# Patient Record
Sex: Male | Born: 1940 | Race: White | Hispanic: No | Smoking: Never smoker
Health system: Southern US, Community
[De-identification: ages and names within clinical notes are randomized; demographics above are authoritative.]

## PROBLEM LIST (undated history)

## (undated) DIAGNOSIS — T83111A Breakdown (mechanical) of urinary sphincter implant, initial encounter: Secondary | ICD-10-CM

## (undated) DIAGNOSIS — Z8719 Personal history of other diseases of the digestive system: Secondary | ICD-10-CM

## (undated) DIAGNOSIS — R0683 Snoring: Secondary | ICD-10-CM

## (undated) DIAGNOSIS — N529 Male erectile dysfunction, unspecified: Secondary | ICD-10-CM

## (undated) DIAGNOSIS — I712 Thoracic aortic aneurysm, without rupture: Secondary | ICD-10-CM

## (undated) DIAGNOSIS — Z9289 Personal history of other medical treatment: Secondary | ICD-10-CM

## (undated) DIAGNOSIS — F40240 Claustrophobia: Secondary | ICD-10-CM

## (undated) DIAGNOSIS — F411 Generalized anxiety disorder: Secondary | ICD-10-CM

## (undated) DIAGNOSIS — I5022 Chronic systolic (congestive) heart failure: Secondary | ICD-10-CM

## (undated) DIAGNOSIS — K219 Gastro-esophageal reflux disease without esophagitis: Secondary | ICD-10-CM

## (undated) DIAGNOSIS — N393 Stress incontinence (female) (male): Secondary | ICD-10-CM

## (undated) DIAGNOSIS — H409 Unspecified glaucoma: Secondary | ICD-10-CM

## (undated) DIAGNOSIS — Z8546 Personal history of malignant neoplasm of prostate: Secondary | ICD-10-CM

## (undated) DIAGNOSIS — M1990 Unspecified osteoarthritis, unspecified site: Secondary | ICD-10-CM

## (undated) DIAGNOSIS — I4891 Unspecified atrial fibrillation: Secondary | ICD-10-CM

## (undated) DIAGNOSIS — R351 Nocturia: Secondary | ICD-10-CM

## (undated) DIAGNOSIS — I509 Heart failure, unspecified: Secondary | ICD-10-CM

## (undated) DIAGNOSIS — K5790 Diverticulosis of intestine, part unspecified, without perforation or abscess without bleeding: Secondary | ICD-10-CM

## (undated) DIAGNOSIS — R918 Other nonspecific abnormal finding of lung field: Secondary | ICD-10-CM

## (undated) DIAGNOSIS — E041 Nontoxic single thyroid nodule: Secondary | ICD-10-CM

## (undated) DIAGNOSIS — T83490A Other mechanical complication of penile (implanted) prosthesis, initial encounter: Secondary | ICD-10-CM

## (undated) DIAGNOSIS — I428 Other cardiomyopathies: Secondary | ICD-10-CM

## (undated) HISTORY — DX: Chronic systolic (congestive) heart failure: I50.22

## (undated) HISTORY — DX: Nontoxic single thyroid nodule: E04.1

## (undated) HISTORY — DX: Thoracic aortic aneurysm, without rupture: I71.2

## (undated) HISTORY — DX: Personal history of other medical treatment: Z92.89

## (undated) HISTORY — DX: Personal history of other diseases of the digestive system: Z87.19

## (undated) HISTORY — DX: Heart failure, unspecified: I50.9

## (undated) HISTORY — DX: Generalized anxiety disorder: F41.1

## (undated) HISTORY — DX: Other cardiomyopathies: I42.8

## (undated) HISTORY — DX: Diverticulosis of intestine, part unspecified, without perforation or abscess without bleeding: K57.90

## (undated) HISTORY — DX: Personal history of malignant neoplasm of prostate: Z85.46

## (undated) HISTORY — PX: ANKLE SURGERY: SHX546

## (undated) HISTORY — DX: Claustrophobia: F40.240

## (undated) HISTORY — DX: Snoring: R06.83

## (undated) HISTORY — DX: Unspecified atrial fibrillation: I48.91

## (undated) HISTORY — PX: BIOPSY THYROID: PRO38

## (undated) HISTORY — DX: Other nonspecific abnormal finding of lung field: R91.8

## (undated) HISTORY — DX: Gastro-esophageal reflux disease without esophagitis: K21.9

---

## 1998-10-14 ENCOUNTER — Other Ambulatory Visit: Admission: RE | Admit: 1998-10-14 | Discharge: 1998-10-14 | Payer: Self-pay | Admitting: Urology

## 1998-11-21 ENCOUNTER — Inpatient Hospital Stay (HOSPITAL_COMMUNITY): Admission: RE | Admit: 1998-11-21 | Discharge: 1998-11-23 | Payer: Self-pay | Admitting: Urology

## 1999-06-22 ENCOUNTER — Inpatient Hospital Stay (HOSPITAL_COMMUNITY): Admission: RE | Admit: 1999-06-22 | Discharge: 1999-06-23 | Payer: Self-pay | Admitting: Urology

## 1999-06-22 HISTORY — PX: OTHER SURGICAL HISTORY: SHX169

## 1999-06-26 ENCOUNTER — Encounter: Admission: RE | Admit: 1999-06-26 | Discharge: 1999-09-24 | Payer: Self-pay | Admitting: Family Medicine

## 2001-01-20 ENCOUNTER — Ambulatory Visit (HOSPITAL_COMMUNITY): Admission: RE | Admit: 2001-01-20 | Discharge: 2001-01-20 | Payer: Self-pay | Admitting: Urology

## 2001-01-20 HISTORY — PX: OTHER SURGICAL HISTORY: SHX169

## 2004-05-15 ENCOUNTER — Ambulatory Visit (HOSPITAL_BASED_OUTPATIENT_CLINIC_OR_DEPARTMENT_OTHER): Admission: RE | Admit: 2004-05-15 | Discharge: 2004-05-15 | Payer: Self-pay | Admitting: Urology

## 2004-05-15 ENCOUNTER — Ambulatory Visit (HOSPITAL_COMMUNITY): Admission: RE | Admit: 2004-05-15 | Discharge: 2004-05-15 | Payer: Self-pay | Admitting: Urology

## 2004-05-15 HISTORY — PX: OTHER SURGICAL HISTORY: SHX169

## 2005-01-19 HISTORY — PX: OTHER SURGICAL HISTORY: SHX169

## 2005-01-22 ENCOUNTER — Observation Stay (HOSPITAL_COMMUNITY): Admission: EM | Admit: 2005-01-22 | Discharge: 2005-01-22 | Payer: Self-pay | Admitting: *Deleted

## 2008-04-26 HISTORY — PX: COLONOSCOPY: SHX174

## 2011-09-07 ENCOUNTER — Other Ambulatory Visit: Payer: Self-pay | Admitting: Urology

## 2011-09-16 ENCOUNTER — Encounter (HOSPITAL_BASED_OUTPATIENT_CLINIC_OR_DEPARTMENT_OTHER): Payer: Self-pay | Admitting: *Deleted

## 2011-09-17 ENCOUNTER — Encounter (HOSPITAL_BASED_OUTPATIENT_CLINIC_OR_DEPARTMENT_OTHER): Payer: Self-pay | Admitting: *Deleted

## 2011-09-17 NOTE — Progress Notes (Signed)
NPO AFTER MN. ARRIVES AT 0715. NEEDS ISTAT AND EKG. WILL DO HIBICLENS HS AND AM OF SURG.

## 2011-09-24 ENCOUNTER — Encounter (HOSPITAL_BASED_OUTPATIENT_CLINIC_OR_DEPARTMENT_OTHER): Payer: Self-pay | Admitting: Anesthesiology

## 2011-09-24 ENCOUNTER — Ambulatory Visit (HOSPITAL_BASED_OUTPATIENT_CLINIC_OR_DEPARTMENT_OTHER)
Admission: RE | Admit: 2011-09-24 | Discharge: 2011-09-24 | Disposition: A | Discharge status: Home / Self Care | Payer: Medicare Other | Source: Ambulatory Visit | Attending: Urology | Admitting: Urology

## 2011-09-24 ENCOUNTER — Ambulatory Visit (HOSPITAL_BASED_OUTPATIENT_CLINIC_OR_DEPARTMENT_OTHER): Payer: Medicare Other | Admitting: Anesthesiology

## 2011-09-24 ENCOUNTER — Encounter (HOSPITAL_BASED_OUTPATIENT_CLINIC_OR_DEPARTMENT_OTHER)
Admission: RE | Disposition: A | Discharge status: Home / Self Care | Payer: Self-pay | Source: Ambulatory Visit | Attending: Urology

## 2011-09-24 ENCOUNTER — Encounter (HOSPITAL_BASED_OUTPATIENT_CLINIC_OR_DEPARTMENT_OTHER): Payer: Self-pay | Admitting: *Deleted

## 2011-09-24 DIAGNOSIS — T83490A Other mechanical complication of penile (implanted) prosthesis, initial encounter: Secondary | ICD-10-CM

## 2011-09-24 DIAGNOSIS — T8389XA Other specified complication of genitourinary prosthetic devices, implants and grafts, initial encounter: Secondary | ICD-10-CM | POA: Insufficient documentation

## 2011-09-24 DIAGNOSIS — Z8546 Personal history of malignant neoplasm of prostate: Secondary | ICD-10-CM | POA: Insufficient documentation

## 2011-09-24 DIAGNOSIS — N529 Male erectile dysfunction, unspecified: Secondary | ICD-10-CM | POA: Insufficient documentation

## 2011-09-24 DIAGNOSIS — Z79899 Other long term (current) drug therapy: Secondary | ICD-10-CM | POA: Insufficient documentation

## 2011-09-24 DIAGNOSIS — Y831 Surgical operation with implant of artificial internal device as the cause of abnormal reaction of the patient, or of later complication, without mention of misadventure at the time of the procedure: Secondary | ICD-10-CM | POA: Insufficient documentation

## 2011-09-24 DIAGNOSIS — E119 Type 2 diabetes mellitus without complications: Secondary | ICD-10-CM | POA: Insufficient documentation

## 2011-09-24 HISTORY — DX: Other mechanical complication of implanted penile prosthesis, initial encounter: T83.490A

## 2011-09-24 HISTORY — DX: Unspecified osteoarthritis, unspecified site: M19.90

## 2011-09-24 HISTORY — DX: Stress incontinence (female) (male): N39.3

## 2011-09-24 HISTORY — DX: Unspecified glaucoma: H40.9

## 2011-09-24 HISTORY — PX: PENILE PROSTHESIS IMPLANT: SHX240

## 2011-09-24 HISTORY — DX: Male erectile dysfunction, unspecified: N52.9

## 2011-09-24 HISTORY — DX: Nocturia: R35.1

## 2011-09-24 HISTORY — DX: Breakdown (mechanical) of implanted urinary sphincter, initial encounter: T83.111A

## 2011-09-24 LAB — GLUCOSE, CAPILLARY: Glucose-Capillary: 203 mg/dL — ABNORMAL HIGH (ref 70–99)

## 2011-09-24 LAB — POCT I-STAT 4, (NA,K, GLUC, HGB,HCT)
Glucose, Bld: 146 mg/dL — ABNORMAL HIGH (ref 70–99)
HCT: 42 % (ref 39.0–52.0)
Hemoglobin: 14.3 g/dL (ref 13.0–17.0)
Potassium: 4.2 mEq/L (ref 3.5–5.1)
Sodium: 142 mEq/L (ref 135–145)

## 2011-09-24 SURGERY — INSERTION, PENILE PROSTHESIS, INFLATABLE
Anesthesia: General | Site: Penis | Wound class: Clean

## 2011-09-24 MED ORDER — SODIUM CHLORIDE 0.9 % IR SOLN
Status: DC | PRN
  Administered 2011-09-24: 10:00:00

## 2011-09-24 MED ORDER — CIPROFLOXACIN HCL 500 MG PO TABS: 500.0000 mg | ORAL_TABLET | Freq: Two times a day (BID) | ORAL | Status: AC

## 2011-09-24 MED ORDER — ONDANSETRON HCL 4 MG/2ML IJ SOLN
INTRAMUSCULAR | Status: DC | PRN
  Administered 2011-09-24 (×4): 1 mg via INTRAVENOUS

## 2011-09-24 MED ORDER — GENTAMICIN SULFATE 40 MG/ML IJ SOLN
160.0000 mg | INTRAMUSCULAR | Status: AC
Start: 2011-09-24 — End: 2011-09-24
  Administered 2011-09-24: 160 mg via INTRAVENOUS

## 2011-09-24 MED ORDER — FENTANYL CITRATE 0.05 MG/ML IJ SOLN: 25.0000 ug | INTRAMUSCULAR | Status: DC | PRN

## 2011-09-24 MED ORDER — LIDOCAINE HCL (CARDIAC) 20 MG/ML IV SOLN
INTRAVENOUS | Status: DC | PRN
  Administered 2011-09-24: 80 mg via INTRAVENOUS

## 2011-09-24 MED ORDER — CEFAZOLIN SODIUM-DEXTROSE 2-3 GM-% IV SOLR: 2.0000 g | INTRAVENOUS | Status: DC

## 2011-09-24 MED ORDER — DEXAMETHASONE SODIUM PHOSPHATE 4 MG/ML IJ SOLN
INTRAMUSCULAR | Status: DC | PRN
  Administered 2011-09-24: 4 mg via INTRAVENOUS

## 2011-09-24 MED ORDER — LACTATED RINGERS IV SOLN: INTRAVENOUS | Status: DC

## 2011-09-24 MED ORDER — METOCLOPRAMIDE HCL 5 MG/ML IJ SOLN
INTRAMUSCULAR | Status: DC | PRN
  Administered 2011-09-24: 5 mg via INTRAVENOUS

## 2011-09-24 MED ORDER — CHLORHEXIDINE GLUCONATE 4 % EX LIQD: Freq: Once | CUTANEOUS | Status: DC

## 2011-09-24 MED ORDER — ACETAMINOPHEN 10 MG/ML IV SOLN
INTRAVENOUS | Status: DC | PRN
  Administered 2011-09-24: 1000 mg via INTRAVENOUS

## 2011-09-24 MED ORDER — SODIUM CHLORIDE 0.9 % IV SOLN
INTRAVENOUS | Status: DC | PRN
  Administered 2011-09-24: 500 mL via INTRAMUSCULAR

## 2011-09-24 MED ORDER — MIDAZOLAM HCL 5 MG/5ML IJ SOLN
INTRAMUSCULAR | Status: DC | PRN
  Administered 2011-09-24 (×2): 1 mg via INTRAVENOUS

## 2011-09-24 MED ORDER — PROPOFOL 10 MG/ML IV EMUL
INTRAVENOUS | Status: DC | PRN
  Administered 2011-09-24: 30 mg via INTRAVENOUS
  Administered 2011-09-24: 200 mg via INTRAVENOUS
  Administered 2011-09-24: 100 mg via INTRAVENOUS

## 2011-09-24 MED ORDER — LACTATED RINGERS IV SOLN
INTRAVENOUS | Status: DC
  Administered 2011-09-24: 10:00:00 via INTRAVENOUS
  Administered 2011-09-24: 100 mL/h via INTRAVENOUS

## 2011-09-24 MED ORDER — CEFAZOLIN SODIUM 1-5 GM-% IV SOLN: 1.0000 g | INTRAVENOUS | Status: DC

## 2011-09-24 MED ORDER — HYDROCODONE-ACETAMINOPHEN 10-325 MG PO TABS: 1.0000 | ORAL_TABLET | ORAL | Status: AC | PRN

## 2011-09-24 MED ORDER — PHENYLEPHRINE HCL 10 MG/ML IJ SOLN
INTRAMUSCULAR | Status: DC | PRN
  Administered 2011-09-24: 100 ug via INTRAVENOUS

## 2011-09-24 MED ORDER — FENTANYL CITRATE 0.05 MG/ML IJ SOLN
INTRAMUSCULAR | Status: DC | PRN
  Administered 2011-09-24 (×4): 25 ug via INTRAVENOUS
  Administered 2011-09-24: 50 ug via INTRAVENOUS
  Administered 2011-09-24 (×2): 25 ug via INTRAVENOUS
  Administered 2011-09-24 (×2): 50 ug via INTRAVENOUS

## 2011-09-24 MED ORDER — DROPERIDOL 2.5 MG/ML IJ SOLN
INTRAMUSCULAR | Status: DC | PRN
  Administered 2011-09-24: 0.625 mg via INTRAVENOUS

## 2011-09-24 SURGICAL SUPPLY — 70 items
ADH SKN CLS APL DERMABOND .7 (GAUZE/BANDAGES/DRESSINGS)
APL SKNCLS STERI-STRIP NONHPOA (GAUZE/BANDAGES/DRESSINGS) ×1
APPLICATOR COTTON TIP 6IN STRL (MISCELLANEOUS) IMPLANT
BAG DECANTER FOR FLEXI CONT (MISCELLANEOUS) ×2 IMPLANT
BAG URINE DRAINAGE (UROLOGICAL SUPPLIES) ×2 IMPLANT
BANDAGE CO FLEX L/F 2IN X 5YD (GAUZE/BANDAGES/DRESSINGS) IMPLANT
BANDAGE GAUZE ELAST BULKY 4 IN (GAUZE/BANDAGES/DRESSINGS) ×2 IMPLANT
BENZOIN TINCTURE PRP APPL 2/3 (GAUZE/BANDAGES/DRESSINGS) ×2 IMPLANT
BLADE HEX COATED 2.75 (ELECTRODE) ×2 IMPLANT
BLADE SURG 10 STRL SS (BLADE) IMPLANT
BLADE SURG 15 STRL LF DISP TIS (BLADE) ×2 IMPLANT
BLADE SURG 15 STRL SS (BLADE) ×2
BLADE SURG ROTATE 9660 (MISCELLANEOUS) IMPLANT
CANISTER SUCTION 1200CC (MISCELLANEOUS) ×1 IMPLANT
CANISTER SUCTION 2500CC (MISCELLANEOUS) IMPLANT
CATH FOLEY 2WAY SLVR  5CC 16FR (CATHETERS) ×1
CATH FOLEY 2WAY SLVR 5CC 16FR (CATHETERS) ×1 IMPLANT
CHLORAPREP W/TINT 26ML (MISCELLANEOUS) ×2 IMPLANT
CLOTH BEACON ORANGE TIMEOUT ST (SAFETY) ×2 IMPLANT
COVER MAYO STAND STRL (DRAPES) ×4 IMPLANT
COVER TABLE BACK 60X90 (DRAPES) ×2 IMPLANT
DERMABOND ADVANCED (GAUZE/BANDAGES/DRESSINGS)
DERMABOND ADVANCED .7 DNX12 (GAUZE/BANDAGES/DRESSINGS) IMPLANT
DISSECTOR ROUND CHERRY 3/8 STR (MISCELLANEOUS) ×1 IMPLANT
DRAPE EXTREMITY T 121X128X90 (DRAPE) ×2 IMPLANT
DRAPE LAPAROTOMY TRNSV 102X78 (DRAPE) ×1 IMPLANT
DRSG TEGADERM 4X4.75 (GAUZE/BANDAGES/DRESSINGS) ×3 IMPLANT
ELECT REM PT RETURN 9FT ADLT (ELECTROSURGICAL) ×2
ELECTRODE REM PT RTRN 9FT ADLT (ELECTROSURGICAL) ×1 IMPLANT
GAUZE SPONGE 4X4 12PLY STRL LF (GAUZE/BANDAGES/DRESSINGS) ×2 IMPLANT
GLOVE BIO SURGEON STRL SZ 6.5 (GLOVE) ×1 IMPLANT
GLOVE BIO SURGEON STRL SZ8 (GLOVE) ×4 IMPLANT
GLOVE INDICATOR 6.5 STRL GRN (GLOVE) ×2 IMPLANT
GLOVE INDICATOR 7.0 STRL GRN (GLOVE) ×2 IMPLANT
GOWN PREVENTION PLUS LG XLONG (DISPOSABLE) ×2 IMPLANT
GOWN STRL REIN XL XLG (GOWN DISPOSABLE) ×2 IMPLANT
HOLDER FOLEY CATH W/STRAP (MISCELLANEOUS) ×2 IMPLANT
IV NS 250ML (IV SOLUTION) ×6
IV NS 250ML BAXH (IV SOLUTION) IMPLANT
NS IRRIG 500ML POUR BTL (IV SOLUTION) ×2 IMPLANT
PACK BASIN DAY SURGERY FS (CUSTOM PROCEDURE TRAY) ×2 IMPLANT
PENCIL BUTTON HOLSTER BLD 10FT (ELECTRODE) ×2 IMPLANT
PLUG CATH AND CAP STER (CATHETERS) ×2 IMPLANT
PUMP TITAN OTR ×1 IMPLANT
RETRACTOR WILSON SYSTEM (INSTRUMENTS) ×2 IMPLANT
SPONGE LAP 18X18 X RAY DECT (DISPOSABLE) IMPLANT
SPONGE LAP 4X18 X RAY DECT (DISPOSABLE) ×8 IMPLANT
STRIP CLOSURE SKIN 1/2X4 (GAUZE/BANDAGES/DRESSINGS) IMPLANT
SUCTION FRAZIER TIP 10 FR DISP (SUCTIONS) ×1 IMPLANT
SUPPORT SCROTAL LG STRP (MISCELLANEOUS) IMPLANT
SURGILUBE 2OZ TUBE FLIPTOP (MISCELLANEOUS) ×2 IMPLANT
SUT CHROMIC 3 0 SH 27 (SUTURE) ×1 IMPLANT
SUT CHROMIC 4 0 RB 1X27 (SUTURE) ×1 IMPLANT
SUT PDS AB 2-0 CT2 27 (SUTURE) ×4 IMPLANT
SUT VIC AB 2-0 UR6 27 (SUTURE) ×14 IMPLANT
SUT VIC AB 3-0 SH 27 (SUTURE)
SUT VIC AB 3-0 SH 27X BRD (SUTURE) IMPLANT
SUT VICRYL 0 UR6 27IN ABS (SUTURE) IMPLANT
SUT VICRYL 4-0 PS2 18IN ABS (SUTURE) IMPLANT
SYR 20CC LL (SYRINGE) ×2 IMPLANT
SYR 50ML LL SCALE MARK (SYRINGE) ×4 IMPLANT
SYR BULB IRRIGATION 50ML (SYRINGE) ×2 IMPLANT
SYRINGE 10CC LL (SYRINGE) ×2 IMPLANT
TOWEL OR 17X24 6PK STRL BLUE (TOWEL DISPOSABLE) ×4 IMPLANT
TRAY DSU PREP LF (CUSTOM PROCEDURE TRAY) ×2 IMPLANT
TUBE CONNECTING 12X1/4 (SUCTIONS) ×2 IMPLANT
Titan CL Reservoir 125cc ×1 IMPLANT
WATER STERILE IRR 3000ML UROMA (IV SOLUTION) IMPLANT
WATER STERILE IRR 500ML POUR (IV SOLUTION) ×2 IMPLANT
YANKAUER SUCT BULB TIP NO VENT (SUCTIONS) ×3 IMPLANT

## 2011-09-24 NOTE — Transfer of Care (Signed)
Immediate Anesthesia Transfer of Care Note  Patient: Alan [name]  Procedure(s) Performed: Procedure(s) (LRB): PENILE PROTHESIS INFLATABLE (N/A)  Patient Location: PACU  Anesthesia Type: General  Level of Consciousness:drowsy, arouses to name, follows commands, irritable  Airway & Oxygen Therapy: Patient Spontanous Breathing and Patient connected to face mask oxygen  Post-op Assessment: Report given to PACU RN and Post -op Vital signs reviewed and stable  Post vital signs: Reviewed and stable  Complications: No apparent anesthesia complications

## 2011-09-24 NOTE — Anesthesia Postprocedure Evaluation (Signed)
  Anesthesia Post-op Note  Patient: Alan [name]  Procedure(s) Performed: Procedure(s) (LRB): PENILE PROTHESIS INFLATABLE (N/A)  Patient Location: PACU  Anesthesia Type: General  Level of Consciousness: awake and alert   Airway and Oxygen Therapy: Patient Spontanous Breathing  Post-op Pain: mild  Post-op Assessment: Post-op Vital signs reviewed, Patient's Cardiovascular Status Stable, Respiratory Function Stable, Patent Airway and No signs of Nausea or vomiting  Post-op Vital Signs: stable  Complications: No apparent anesthesia complications

## 2011-09-24 NOTE — Anesthesia Procedure Notes (Signed)
Procedure Name: LMA Insertion Date/Time: 09/24/2011 8:48 AM Performed by: Norva Pavlov Pre-anesthesia Checklist: Patient identified, Emergency Drugs available, Suction available and Patient being monitored Patient Re-evaluated:Patient Re-evaluated prior to inductionOxygen Delivery Method: Circle System Utilized Preoxygenation: Pre-oxygenation with 100% oxygen Intubation Type: IV induction Ventilation: Mask ventilation without difficulty LMA: LMA inserted LMA Size: 4.0 Number of attempts: 1 Airway Equipment and Method: bite block Placement Confirmation: positive ETCO2 Tube secured with: Tape Dental Injury: Teeth and Oropharynx as per pre-operative assessment

## 2011-09-24 NOTE — Discharge Instructions (Addendum)
Penile prosthesis postoperative instructions  Wound:  In most cases your incision will have absorbable sutures that will dissolve within the first 10-20 days. Some will fall out even earlier. Expect some redness as the sutures dissolved but this should occur only around the sutures. If there is generalized redness, especially with increasing pain or swelling, let us know. The scrotum and penis is will very likely get "black and blue" as the blood in the tissues spread. Sometimes the whole scrotum will turn colors. The black and blue is followed by a yellow and brown color. In time, all the discoloration will go away. In some cases some firm swelling in the area of the testicle and pump may persist for up to 4-6 weeks after the surgery and is considered normal in most cases.  Diet:  You may return to your normal diet within 24 hours following your surgery. You may note some mild nausea and possibly vomiting the first 6-8 hours following surgery. This is usually due to the side effects of anesthesia, and will disappear quite soon. I would suggest clear liquids and a very light meal the first evening following your surgery.  Activity:  Your physical activity should be restricted the first 48 hours. During that time you should remain relatively inactive, moving about only when necessary. During the first 7-10 days following surgery he should avoid lifting any heavy objects (anything greater than 15 pounds), and avoid strenuous exercise. If you work, ask [place] specifically about your restrictions, both for work and home. We will write a note to your employer if needed.  You should plan to wear a tight pair of jockey shorts or an athletic supporter for the first 4-5 days, even to sleep. This will keep the scrotum immobilized to some degree and keep the swelling down.The position of your penis will determine what is most comfortable but I strongly urge you to keep the penis in the "up" position (toward your  head).  Ice packs should be placed on and off over the scrotum for the first 48 hours. Frozen peas or corn in a ZipLock bag can be frozen, used and re-frozen. Fifteen minutes on and 15 minutes off is a reasonable schedule. The ice is a good pain reliever and keeps the swelling down.  Hygiene:  You may shower 48 hours after your surgery. Tub bathing should be restricted until the seventh day.  Medication:  You will be sent home with some type of pain medication. In many cases you will be sent home with a narcotic pain pill (Vicodin or Tylox). If the pain is not too bad, you may take either Tylenol (acetaminophen) or Advil (ibuprofen) which contain no narcotic agents, and might be tolerated a little better, with fewer side effects. If the pain medication you are sent home with does not control the pain, you will have to let us know. Some narcotic pain medications cannot be given or refilled by a phone call to a pharmacy.  Problems you should report to [place]:   Fever of 101.0 degrees Fahrenheit or greater.  Moderate or severe swelling under the skin incision or involving the scrotum. Drug reaction such as hives, a rash, nausea or vomiting. Post Anesthesia Home Care Instructions  Activity: Get plenty of rest for the remainder of the day. A responsible adult should stay with you for 24 hours following the procedure.  For the next 24 hours, DO NOT: -Drive a car -Advertising copywriter -Drink alcoholic beverages -Take any medication unless instructed by  your physician -Make any legal decisions or sign important papers.  Meals: Start with liquid foods such as gelatin or soup. Progress to regular foods as tolerated. Avoid greasy, spicy, heavy foods. If nausea and/or vomiting occur, drink only clear liquids until the nausea and/or vomiting subsides. Call your physician if vomiting continues.  Special Instructions/Symptoms: Your throat may feel dry or sore from the anesthesia or the breathing tube  placed in your throat during surgery. If this causes discomfort, gargle with warm salt water. The discomfort should disappear within 24 hours.

## 2011-09-24 NOTE — Anesthesia Preprocedure Evaluation (Signed)
Anesthesia Evaluation  Patient identified by MRN, date of birth, ID band Patient awake    Reviewed: Allergy & Precautions, H&P , NPO status , Patient's Chart, lab work & pertinent test results  Airway Mallampati: II TM Distance: >3 FB Neck ROM: full    Dental No notable dental hx. (+) Dental Advisory Given and Missing,    Pulmonary neg pulmonary ROS,  breath sounds clear to auscultation  Pulmonary exam normal       Cardiovascular Exercise Tolerance: Good negative cardio ROS  Rhythm:regular Rate:Normal     Neuro/Psych negative neurological ROS  negative psych ROS   GI/Hepatic negative GI ROS, Neg liver ROS,   Endo/Other  negative endocrine ROSDiabetes mellitus-, Well Controlled, Type 2, Oral Hypoglycemic Agents  Renal/GU negative Renal ROS  negative genitourinary   Musculoskeletal   Abdominal   Peds  Hematology negative hematology ROS (+)   Anesthesia Other Findings   Reproductive/Obstetrics negative OB ROS                           Anesthesia Physical Anesthesia Plan  ASA: III  Anesthesia Plan: General   Post-op Pain Management:    Induction: Intravenous  Airway Management Planned: LMA  Additional Equipment:   Intra-op Plan:   Post-operative Plan:   Informed Consent: I have reviewed the patients History and Physical, chart, labs and discussed the procedure including the risks, benefits and alternatives for the proposed anesthesia with the patient or authorized representative who has indicated his/her understanding and acceptance.   Dental Advisory Given  Plan Discussed with: CRNA and Surgeon  Anesthesia Plan Comments:         Anesthesia Quick Evaluation

## 2011-09-24 NOTE — Op Note (Signed)
PATIENT:  Alan [name]  PRE-OPERATIVE DIAGNOSIS:  1. Organic erectile dysfunction 2. Malfunctioning 3 piece inflatable penile prosthesis  POST-OPERATIVE DIAGNOSIS:  Same  PROCEDURE:  1. Removal of malfunctioning inflatable penile prosthesis 2. Replacement of three-piece inflatable penile prosthesis ([employer] Titan)  SURGEON:  [name]  INDICATION: Alan [name] has a history of organic erectile dysfunction and in 2/01 underwent insertion of a 3 piece (Mentor Alpha 1) inflatable penile prosthesis. He reported to me about 4 weeks ago he noted the device suddenly became flaccid and he was unable to inflated. When I discuss this further with him today he indicated that in fact it seemed to be malfunctioning and not inflating as much as it had originally over a much longer period of time possibly 6 months. We discussed the treatment options and he has elected to proceed with replacement of the device.  ANESTHESIA:  General  EBL:  Minimal  Device: 3 piece Titan: 125 cc reservoir, 20 cm cylinders and 2.0 cm & 1.5 cm cm rear-tip extenders on right and left sides  LOCAL MEDICATIONS USED:  None  SPECIMEN: None  DISPOSITION OF SPECIMEN:  N/A  Description of procedure: The patient was taken to the major operating room, placed on the table and administered general anesthesia in the supine position. His genitalia was then scrubbed for 10 minutes, painted and then sterilely draped. An official timeout was then performed.  A 16 French Foley catheter was placed in the bladder after his sphincter was opened and locked in the open position and the bladder was drained and the catheter was plugged. A midline median raphae scrotal incision was then made and carried down to the pump which was exposed and I noted one of the tubes supplying the cylinder had ruptured just above the pump. I disconnected the pump from the reservoir after having placed a shod hemostat on the tubing to the reservoir. I  then cut down on the tubing from the pump using the electrocautery  on coag until I reached the corpora. I incised the corpora distally and exposed the cylinders. These were extracted using a right angle clamp. The cylinders and rear-tip extenders were removed in toto. I then irrigated the proximal and distal corpora. The corpora were then measured and noted to be 11.5 cm on the right and left side distally and 12 cm on the right and left side proximally.  I chose a 20 cm cylinder and used a 2 cm as well as a 1.5 cm rear-tip extender on each of the cylinders. I placed the suture from the distal aspect of the right cylinder through a Keith needle and then placed this in the [employer]. I then passed the [facility] inserter through the corporotomy and advanced the Maitland needle out through the glans. This was performed in an identical fashion on the left side. I then irrigated both corpora a second time with antibiotic irrigation and placed to the cylinders in the corresponding corpora distally and then proximally. I then connected the system to a 60 cc syringe and inflated the device. It inflated easily and completely. Palpation of the device beneath the glans revealed both cylinders were equidistant beneath the glans. There was no evidence of buckling or aneurysmal dilatation through the corporotomies. I therefore deflated the cylinders and closed the corporotomies with running 2-0 PDS suture.  I initially had filled the reservoir with 75 cc of fluid and noted some slight back pressure. I then removed 75 cc of clear fluid. This  indicated that the reservoir appeared to be intact and I had planned to leave the reservoir in place. I therefore re\re filled the reservoir with nuclear sterile saline to 75 cc and placed shod hemostat on the tubing. I then connected the tubing from the pump to the tubing from the reservoir after excising the excess tubing using the supplied connection device. After the system was  connected I then reinflated the prosthesis and in doing so, despite the fact there were 75 cc placed in the reservoir, I noted the pump did not refill after I had pumped the majority of fluid into the cylinders. I checked to be sure there was  kinking of the tubing. As none was noted I therefore turned my attention to the reservoir once again. I placed shod stats on the tubing and disconnected the reservoir. I then instilled 75 cc of sterile saline and placed a shod stat on the tubing after allowing several minutes to pass I then drained the reservoir and found a little over 50 cc present. This indicated that there was a leak in the reservoir as well. I therefore elected to replace the reservoir. I cut down the reservoir tubing and freed the reservoir from its location in the retropubic space. I irrigated this with antibiotic solution and then placed the new, 125 cc reservoir in the previous location. I filled this reservoir with 100 cc of sterile saline. I then reconnected the reservoir with the pump tubing again in an identical fashion as previous. I cycled the device again and it cycled properly. I deflated the device and irrigated the scrotum again with antibiotic solution. I placed the pump in the location of the previous pump.  I irrigated the wound one last time with antibiotic irrigation and then closed the deep scrotal tissue over the tubing and pump with running 3-0 chromic suture. A second layer was then closed over this first layer with running 3-0 chromic and then the skin was closed with running 3-0 chromic. Antibiotic ointment and a 4 x 4 gauze were then applied to the scrotum. I then wrapped the scrotum and penis with a Curlex. The artificial sphincter had previously been locked in the open position prior to placing the Foley catheter. I therefore removed the catheter and reactivated his sphincter prior to applying his scrotal dressing. He tolerated the procedure well and there were no  intraoperative complications. Needle sponge and instrument counts were correct at the end of the operation.  PATIENT DISPOSITION:  PACU - hemodynamically stable.theter was connected to closed system drainage and the patient was awakened and taken recovery room in stable and satisfactory condition.

## 2011-09-24 NOTE — OR Nursing (Signed)
Sales representative for [employer] took the removed prosthesis with her after it had been cleaned in decontamination work room

## 2011-09-24 NOTE — H&P (Signed)
Alan [name] is a 71 year old male with a history of prostate cancer.   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 In 7/00 he underwent a radical prostatectomy which revealed Gleason 6 pT2b,N0 with his PSA falling to undetectable where it has remained since that time.  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 Due to recurrent incontinence he underwent a sphincter revision with the placement of tandem 4.0 cm cuffs. He saw Dr. Sherron Monday in 7/08 and underwent urodynamics which revealed a very low leak point pressure of 57 cm H2O with a catheter in place and 2 cm H2O with a catheter removed. He discussed proceeding with possible placement of a cuff further proximal to the current cuffs and removing the 2 cuffs are currently in place versus a male sling.  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  Interval history: He presented having noted that 4 weeks ago his inflatable penile prosthesis failed. He is unable to pump the device up anymore. He has not experienced any pain. He remains incontinent wearing 4-5 pads per day and did not undergo revision of his sphincter by Dr. Sherron Monday in 2008.   Past Medical History Problems  1. History of  Diabetes Mellitus 250.00 2. History of  Prostate Cancer V10.46  Surgical History Problems  1. History of  Implantation Of Artificial Urinary Sphincter (AUS) 2. History of  Prostate Surgery 3. History of  Prostatect Retropubic Radical W/ Bilat Pelv Lymphadenectomy 4. History of  Surg Penis Inflatable Penile Prosthesis  Current Meds 1. Avandia TABS; Therapy: (Recorded:30May2008) to 2. Meclizine HCl TABS; Therapy:  (Recorded:30May2008) to 3. Tricor CAPS; Therapy: (Recorded:30May2008) to  Allergies Medication  1. No Known Drug Allergies  Social History Problems    Occupation: retired Art therapist    Alcohol Use   Tobacco Use  Review of Systems Genitourinary and constitutional system(s) were reviewed and pertinent findings if present are noted.  Genitourinary: nocturia, incontinence and erectile dysfunction, but no penile pain.    Vitals Vital Signs BMI Calculated: 28.63 BSA Calculated: 2.09 Height: 5 ft 10 in Weight: 200 lb  Blood Pressure: 148 / 66 Heart Rate: 71  Physical Exam Constitutional: Well nourished and well developed . No acute distress.  ENT:. The ears and nose are normal in appearance.  Neck: The appearance of the neck is normal and no neck mass is present.  Pulmonary: No respiratory distress and normal respiratory rhythm and effort.  Cardiovascular: Heart rate and rhythm are normal . No peripheral edema.  Abdomen: lower midline incision site(s) well healed. The abdomen is soft and nontender. No masses are palpated. No CVA tenderness. No hernias are palpable. No hepatosplenomegaly noted.  Genitourinary: Examination of the penis demonstrates a penile prosthesis, but no discharge, no masses, no lesions and a normal meatus. The penis is uncircumcised. The scrotum is without lesions. The right epididymis is palpably normal and non-tender. The left epididymis is palpably normal and non-tender. The right testis is non-tender and without masses. The left testis is non-tender and without masses.  Lymphatics: The femoral and inguinal nodes are not enlarged or tender.  Skin: Normal skin turgor, no visible rash and no visible skin lesions.  Neuro/Psych:. Mood and  affect are appropriate.    Results/Data Urine  COLOR YELLOW   APPEARANCE CLEAR   SPECIFIC GRAVITY 1.030   pH 5.5   GLUCOSE NEG mg/dL  BILIRUBIN NEG   KETONE NEG mg/dL  BLOOD NEG   PROTEIN NEG mg/dL    UROBILINOGEN 0.2 mg/dL  NITRITE NEG   LEUKOCYTE ESTERASE NEG    Assessment Assessed  1. 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 Mechanical Complication Of Genitourinary Device 996.30   I had a long discussion with him about the fact that on exam I found his prosthesis clearly had leaked. We therefore discussed treatment of this and he has had difficulty with his incontinence for a number of years. The problem that brought him in was his prosthesis malfunctioning and he is getting radiated married. He wanted to get that fixed as soon as possible. We did discuss possibly performing 2 surgeries at the same time but elected to perform replacement of his malfunctioning prosthesis initially and then once he got over that he could consider sphincter revision. I went over the procedure with him again in detail. He understands the risks, complications and alternatives. He understands and has elected to proceed. I told him that I would definitely replace the pump and most likely the cylinders but could potentially leave the reservoir in place.   Plan    He is scheduled for replacement of his inflatable penile prosthesis which has malfunctioned.

## 2011-09-30 ENCOUNTER — Encounter (HOSPITAL_BASED_OUTPATIENT_CLINIC_OR_DEPARTMENT_OTHER): Payer: Self-pay | Admitting: Urology

## 2011-10-01 ENCOUNTER — Encounter (HOSPITAL_BASED_OUTPATIENT_CLINIC_OR_DEPARTMENT_OTHER): Payer: Self-pay

## 2012-01-04 ENCOUNTER — Other Ambulatory Visit: Payer: Self-pay | Admitting: Urology

## 2012-01-05 ENCOUNTER — Encounter (HOSPITAL_BASED_OUTPATIENT_CLINIC_OR_DEPARTMENT_OTHER): Payer: Self-pay | Admitting: *Deleted

## 2012-01-05 NOTE — Progress Notes (Signed)
To [facility] at 0930- Istat on arrival, Ekg in epic-Npo after Mn.

## 2012-01-06 DIAGNOSIS — T8389XA Other specified complication of genitourinary prosthetic devices, implants and grafts, initial encounter: Secondary | ICD-10-CM

## 2012-01-06 NOTE — H&P (Signed)
History of Present Illness     History prostate cancer: Due to an elevated PSA of 4.1 he underwent TRUS/Bx in 6/00 with the finding of Gleason 3+3 = 6 adenocarcinoma from the right lobe. In 7/00 he underwent a radical prostatectomy which revealed Gleason 6 pT2b,N0 with his PSA falling to undetectable where it has remained since that time.  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 Due to recurrent incontinence he underwent a sphincter revision with the placement of tandem 4.0 cm cuffs. He saw Dr. Sherron Monday in 7/08 and underwent urodynamics which revealed a very low leak point pressure of 57 cm H2O with a catheter in place and 2 cm H2O with a catheter removed. He discussed proceeding with possible placement of a cuff further proximal to the current cuffs and leaving the 2 cuffs that are currently in place.  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 His prosthesis ceased to inflate and was replaced on 09/24/11.  Interval history: It's been 3 months since his prosthesis was inserted and he tells me he is having difficulty sitting. He is having pain. He said he was in to see his dermatologist because he had a yeast infection and asked that he check an area behind the head of the penis on the left-hand side. He said he was told that it was scar tissue. Within 24 hours though he developed significant drainage from this location. The other day he said he tried to pump the device up and experienced severe pain and had to deflate the device. He says he is seeing some spontaneous inflation at night. This causes pain and he has to deflated in the morning. He is having drainage from the location that he described and is not having any fever or other constitutional symptoms.    Past Medical History Problems  1.  History of  Anxiety (Symptom) 300.00 2. History of  Diabetes Mellitus 250.00 3. History of  Esophageal Reflux 530.81 4. History of  Glaucoma 365.9 5. History of  Hypercholesterolemia 272.0 6. History of  Mechanical Complication Of Genitourinary Device 996.30 7. History of  Prostate Cancer V10.46  Surgical History Problems  1. History of  Implantation Of Artificial Urinary Sphincter (AUS) 2. History of  Prostate Surgery 3. History of  Prostatect Retropubic Radical W/ Bilat Pelv Lymphadenectomy 4. History of  Surg Penis Inflatable Penile Prosthesis 5. History of  Surg Penis Replacement Of Inflatable Penile Prosthesis  Current Meds 1. Avandia TABS; Therapy: (Recorded:30May2008) to 2. B-12 TBCR; Therapy: (Recorded:14May2013) to 3. Cephalexin 500 MG Oral Tablet; TAKE 1 TABLET Twice daily; Therapy: 30Jul2013 to (Last  Rx:30Jul2013)  Requested for: 30Jul2013 4. D-2000 Maximum Strength 2000 UNIT Oral Tablet; Therapy: (Recorded:14May2013) to 5. GlipiZIDE 5 MG Oral Tablet; Therapy: (Recorded:14May2013) to 6. Meclizine HCl TABS; Therapy: (Recorded:30May2008) to 7. Meloxicam 15 MG Oral Tablet; TAKE 1 TABLET DAILY WITH FOOD; Therapy: 30Jul2013 to  (Complete:28Oct2013)  Requested for: 30Jul2013; Last Rx:30Jul2013 8. MetFORMIN HCl 500 MG Oral Tablet; Therapy: (Recorded:14May2013) to  Allergies Medication  1. Cipro TABS  Social History Problems  1. Never A Smoker 2. Occupation: retired Patent examiner 3. Retired From Work Denied  4. Alcohol Use 5. Tobacco Use  Review of Systems Genitourinary, constitutional, skin, eye, otolaryngeal, hematologic/lymphatic, cardiovascular, pulmonary, endocrine, musculoskeletal, gastrointestinal, neurological and psychiatric system(s) were reviewed and pertinent findings if present are noted.  Genitourinary:  Penile pain and discharge.  Vitals Vital Signs Blood Pressure: 132 / 67 Temperature: 98.3 F Heart Rate: 76  Physical Exam Constitutional: Well  nourished and well developed . No acute distress. The patient appears well hydrated.  ENT:. The ears and nose are normal in appearance.  Neck: The appearance of the neck is normal.  Pulmonary: No respiratory distress.  Cardiovascular: Heart rate and rhythm are normal.  Abdomen: The abdomen is mildly obese. The abdomen is soft and nontender. No suprapubic tenderness.  Rectal: The prostate exam was deferred.  Genitourinary: Penis tender with A small area just on the glans on the left-hand side it appears to be an area of skin breakdown over the prosthesis with purulent drainage expressed from this location. Skin: Normal skin turgor and normal skin color and pigmentation.  Neuro/Psych:. Mood and affect are appropriate.   Assessment Assessed  1. Organic Impotence 607.84   He has an infected penile prosthesis. It will need to be removed and we discussed that. I'm going to place him on some doxycycline until the surgery date and given pain medication. Once his prosthesis is removed and his infection is clear we will then decide about replacement or referral.   Plan    1. doxycycline prescription. 2. I gave him prescription for pain medication. 3. It will be scheduled for explantation of his infected penile prosthesis.

## 2012-01-07 ENCOUNTER — Ambulatory Visit (HOSPITAL_BASED_OUTPATIENT_CLINIC_OR_DEPARTMENT_OTHER)
Admission: RE | Admit: 2012-01-07 | Discharge: 2012-01-07 | Disposition: A | Discharge status: Home / Self Care | Payer: Medicare Other | Source: Ambulatory Visit | Attending: Urology | Admitting: Urology

## 2012-01-07 ENCOUNTER — Encounter (HOSPITAL_BASED_OUTPATIENT_CLINIC_OR_DEPARTMENT_OTHER)
Admission: RE | Disposition: A | Discharge status: Home / Self Care | Payer: Self-pay | Source: Ambulatory Visit | Attending: Urology

## 2012-01-07 ENCOUNTER — Encounter (HOSPITAL_BASED_OUTPATIENT_CLINIC_OR_DEPARTMENT_OTHER): Payer: Self-pay | Admitting: Certified Registered"

## 2012-01-07 ENCOUNTER — Ambulatory Visit (HOSPITAL_BASED_OUTPATIENT_CLINIC_OR_DEPARTMENT_OTHER): Payer: Medicare Other | Admitting: Certified Registered"

## 2012-01-07 ENCOUNTER — Encounter (HOSPITAL_BASED_OUTPATIENT_CLINIC_OR_DEPARTMENT_OTHER): Payer: Self-pay | Admitting: *Deleted

## 2012-01-07 DIAGNOSIS — N529 Male erectile dysfunction, unspecified: Secondary | ICD-10-CM | POA: Insufficient documentation

## 2012-01-07 DIAGNOSIS — Z8546 Personal history of malignant neoplasm of prostate: Secondary | ICD-10-CM | POA: Insufficient documentation

## 2012-01-07 DIAGNOSIS — K219 Gastro-esophageal reflux disease without esophagitis: Secondary | ICD-10-CM | POA: Insufficient documentation

## 2012-01-07 DIAGNOSIS — E119 Type 2 diabetes mellitus without complications: Secondary | ICD-10-CM | POA: Insufficient documentation

## 2012-01-07 DIAGNOSIS — IMO0002 Reserved for concepts with insufficient information to code with codable children: Secondary | ICD-10-CM | POA: Insufficient documentation

## 2012-01-07 DIAGNOSIS — Z79899 Other long term (current) drug therapy: Secondary | ICD-10-CM | POA: Insufficient documentation

## 2012-01-07 DIAGNOSIS — E7800 Pure hypercholesterolemia, unspecified: Secondary | ICD-10-CM | POA: Insufficient documentation

## 2012-01-07 DIAGNOSIS — Y831 Surgical operation with implant of artificial internal device as the cause of abnormal reaction of the patient, or of later complication, without mention of misadventure at the time of the procedure: Secondary | ICD-10-CM | POA: Insufficient documentation

## 2012-01-07 DIAGNOSIS — T8389XA Other specified complication of genitourinary prosthetic devices, implants and grafts, initial encounter: Secondary | ICD-10-CM

## 2012-01-07 LAB — POCT I-STAT 4, (NA,K, GLUC, HGB,HCT)
Glucose, Bld: 185 mg/dL — ABNORMAL HIGH (ref 70–99)
HCT: 44 % (ref 39.0–52.0)
Hemoglobin: 15 g/dL (ref 13.0–17.0)
Potassium: 4.1 mEq/L (ref 3.5–5.1)
Sodium: 140 mEq/L (ref 135–145)

## 2012-01-07 LAB — GLUCOSE, CAPILLARY: Glucose-Capillary: 169 mg/dL — ABNORMAL HIGH (ref 70–99)

## 2012-01-07 SURGERY — REMOVAL, PENILE PROSTHESIS
Anesthesia: General | Site: Penis | Wound class: Clean Contaminated

## 2012-01-07 MED ORDER — SODIUM CHLORIDE 0.9 % IR SOLN
Status: DC | PRN
  Administered 2012-01-07 (×2)

## 2012-01-07 MED ORDER — CEFAZOLIN SODIUM-DEXTROSE 2-3 GM-% IV SOLR
2.0000 g | INTRAVENOUS | Status: AC
  Administered 2012-01-07: 2 g via INTRAVENOUS

## 2012-01-07 MED ORDER — FENTANYL CITRATE 0.05 MG/ML IJ SOLN: 25.0000 ug | INTRAMUSCULAR | Status: DC | PRN

## 2012-01-07 MED ORDER — CIPROFLOXACIN HCL 500 MG PO TABS: 500.0000 mg | ORAL_TABLET | Freq: Two times a day (BID) | ORAL | Status: AC

## 2012-01-07 MED ORDER — MEPERIDINE HCL 25 MG/ML IJ SOLN: 6.2500 mg | INTRAMUSCULAR | Status: DC | PRN

## 2012-01-07 MED ORDER — LIDOCAINE HCL (CARDIAC) 20 MG/ML IV SOLN
INTRAVENOUS | Status: DC | PRN
  Administered 2012-01-07: 60 mg via INTRAVENOUS

## 2012-01-07 MED ORDER — LACTATED RINGERS IV SOLN
INTRAVENOUS | Status: DC
  Administered 2012-01-07 (×2): via INTRAVENOUS

## 2012-01-07 MED ORDER — ONDANSETRON HCL 4 MG/2ML IJ SOLN
INTRAMUSCULAR | Status: DC | PRN
  Administered 2012-01-07: 4 mg via INTRAVENOUS

## 2012-01-07 MED ORDER — PROPOFOL 10 MG/ML IV BOLUS
INTRAVENOUS | Status: DC | PRN
  Administered 2012-01-07: 180 mg via INTRAVENOUS

## 2012-01-07 MED ORDER — GENTAMICIN SULFATE 40 MG/ML IJ SOLN
360.0000 mg | INTRAVENOUS | Status: AC
  Administered 2012-01-07: 360 mg via INTRAVENOUS
  Filled 2012-01-07: qty 9

## 2012-01-07 MED ORDER — MIDAZOLAM HCL 5 MG/5ML IJ SOLN
INTRAMUSCULAR | Status: DC | PRN
  Administered 2012-01-07: 2 mg via INTRAVENOUS

## 2012-01-07 MED ORDER — LACTATED RINGERS IV SOLN: INTRAVENOUS | Status: DC

## 2012-01-07 MED ORDER — FENTANYL CITRATE 0.05 MG/ML IJ SOLN
INTRAMUSCULAR | Status: DC | PRN
  Administered 2012-01-07 (×8): 25 ug via INTRAVENOUS
  Administered 2012-01-07: 50 ug via INTRAVENOUS
  Administered 2012-01-07: 25 ug via INTRAVENOUS

## 2012-01-07 MED ORDER — PROMETHAZINE HCL 25 MG/ML IJ SOLN: 6.2500 mg | INTRAMUSCULAR | Status: DC | PRN

## 2012-01-07 MED ORDER — OXYCODONE-ACETAMINOPHEN 10-325 MG PO TABS: 1.0000 | ORAL_TABLET | ORAL | Status: AC | PRN

## 2012-01-07 SURGICAL SUPPLY — 67 items
ADH SKN CLS APL DERMABOND .7 (GAUZE/BANDAGES/DRESSINGS)
APL SKNCLS STERI-STRIP NONHPOA (GAUZE/BANDAGES/DRESSINGS)
APPLICATOR COTTON TIP 6IN STRL (MISCELLANEOUS) IMPLANT
BAG DECANTER FOR FLEXI CONT (MISCELLANEOUS) IMPLANT
BAG URINE DRAINAGE (UROLOGICAL SUPPLIES) ×2 IMPLANT
BANDAGE CO FLEX L/F 2IN X 5YD (GAUZE/BANDAGES/DRESSINGS) IMPLANT
BANDAGE GAUZE ELAST BULKY 4 IN (GAUZE/BANDAGES/DRESSINGS) ×2 IMPLANT
BENZOIN TINCTURE PRP APPL 2/3 (GAUZE/BANDAGES/DRESSINGS) ×1 IMPLANT
BLADE HEX COATED 2.75 (ELECTRODE) ×2 IMPLANT
BLADE SURG 10 STRL SS (BLADE) ×1 IMPLANT
BLADE SURG 15 STRL LF DISP TIS (BLADE) ×1 IMPLANT
BLADE SURG 15 STRL SS (BLADE) ×2
BLADE SURG ROTATE 9660 (MISCELLANEOUS) ×1 IMPLANT
CANISTER SUCTION 1200CC (MISCELLANEOUS) IMPLANT
CANISTER SUCTION 2500CC (MISCELLANEOUS) IMPLANT
CATH FOLEY 2WAY SLVR  5CC 16FR (CATHETERS) ×1
CATH FOLEY 2WAY SLVR 5CC 16FR (CATHETERS) ×1 IMPLANT
CHLORAPREP W/TINT 26ML (MISCELLANEOUS) IMPLANT
CLOTH BEACON ORANGE TIMEOUT ST (SAFETY) ×2 IMPLANT
COVER MAYO STAND STRL (DRAPES) ×2 IMPLANT
COVER TABLE BACK 60X90 (DRAPES) ×2 IMPLANT
DERMABOND ADVANCED (GAUZE/BANDAGES/DRESSINGS)
DERMABOND ADVANCED .7 DNX12 (GAUZE/BANDAGES/DRESSINGS) IMPLANT
DISSECTOR ROUND CHERRY 3/8 STR (MISCELLANEOUS) IMPLANT
DRAIN PENROSE 18X1/2 LTX STRL (DRAIN) ×3 IMPLANT
DRAPE EXTREMITY T 121X128X90 (DRAPE) ×1 IMPLANT
DRAPE LAPAROTOMY TRNSV 102X78 (DRAPE) ×1 IMPLANT
DRSG TEGADERM 4X4.75 (GAUZE/BANDAGES/DRESSINGS) ×1 IMPLANT
ELECT REM PT RETURN 9FT ADLT (ELECTROSURGICAL) ×2
ELECTRODE REM PT RTRN 9FT ADLT (ELECTROSURGICAL) ×1 IMPLANT
GAUZE SPONGE 4X4 12PLY STRL LF (GAUZE/BANDAGES/DRESSINGS) ×3 IMPLANT
GLOVE BIO SURGEON STRL SZ8 (GLOVE) ×3 IMPLANT
GLOVE INDICATOR 6.5 STRL GRN (GLOVE) ×1 IMPLANT
GLOVE INDICATOR 7.0 STRL GRN (GLOVE) ×1 IMPLANT
GOWN PREVENTION PLUS LG XLONG (DISPOSABLE) ×2 IMPLANT
GOWN STRL REIN XL XLG (GOWN DISPOSABLE) ×2 IMPLANT
HOLDER FOLEY CATH W/STRAP (MISCELLANEOUS) ×1 IMPLANT
NS IRRIG 500ML POUR BTL (IV SOLUTION) ×2 IMPLANT
PACK BASIN DAY SURGERY FS (CUSTOM PROCEDURE TRAY) ×2 IMPLANT
PENCIL BUTTON HOLSTER BLD 10FT (ELECTRODE) ×2 IMPLANT
PIN SAFETY STERILE (MISCELLANEOUS) ×1 IMPLANT
PLUG CATH AND CAP STER (CATHETERS) ×2 IMPLANT
RETRACTOR WILSON SYSTEM (INSTRUMENTS) ×1 IMPLANT
SPONGE LAP 18X18 X RAY DECT (DISPOSABLE) ×1 IMPLANT
SPONGE LAP 4X18 X RAY DECT (DISPOSABLE) ×3 IMPLANT
STRIP CLOSURE SKIN 1/2X4 (GAUZE/BANDAGES/DRESSINGS) IMPLANT
SUPPORT SCROTAL LG STRP (MISCELLANEOUS) IMPLANT
SURGILUBE 2OZ TUBE FLIPTOP (MISCELLANEOUS) ×2 IMPLANT
SUT CHROMIC 3 0 SH 27 (SUTURE) ×5 IMPLANT
SUT ETHILON 3 0 PS 1 (SUTURE) ×1 IMPLANT
SUT MNCRL AB 4-0 PS2 18 (SUTURE) ×2 IMPLANT
SUT PDS AB 2-0 CT2 27 (SUTURE) IMPLANT
SUT VIC AB 2-0 UR6 27 (SUTURE) ×3 IMPLANT
SUT VIC AB 3-0 SH 27 (SUTURE)
SUT VIC AB 3-0 SH 27X BRD (SUTURE) IMPLANT
SUT VICRYL 0 UR6 27IN ABS (SUTURE) IMPLANT
SUT VICRYL 4-0 PS2 18IN ABS (SUTURE) ×1 IMPLANT
SYR 20CC LL (SYRINGE) IMPLANT
SYR 50ML LL SCALE MARK (SYRINGE) IMPLANT
SYR BULB IRRIGATION 50ML (SYRINGE) ×2 IMPLANT
SYRINGE 10CC LL (SYRINGE) ×2 IMPLANT
TOWEL OR 17X24 6PK STRL BLUE (TOWEL DISPOSABLE) ×5 IMPLANT
TRAY DSU PREP LF (CUSTOM PROCEDURE TRAY) ×2 IMPLANT
TUBE CONNECTING 12X1/4 (SUCTIONS) ×2 IMPLANT
WATER STERILE IRR 3000ML UROMA (IV SOLUTION) IMPLANT
WATER STERILE IRR 500ML POUR (IV SOLUTION) ×2 IMPLANT
YANKAUER SUCT BULB TIP NO VENT (SUCTIONS) ×3 IMPLANT

## 2012-01-07 NOTE — Progress Notes (Signed)
Dr Vernie Ammons reports pt doesn't need to void prior to D/C

## 2012-01-07 NOTE — Progress Notes (Signed)
Pt extremely anxious, states he is having a hard time breathing and feels hot. Respirations are clear and is maintaining O2 sats in high 90's.  Is a [place] veteran, and states he suffers from claustrophobia and  PTSD.  Wants to see outside.  Given reassurance without curtains drawn.

## 2012-01-07 NOTE — Anesthesia Postprocedure Evaluation (Signed)
  Anesthesia Post-op Note  Patient: Alan [name]  Procedure(s) Performed: Procedure(s) (LRB): REMOVAL OF PENILE PROSTHESIS (N/A)  Patient Location: PACU  Anesthesia Type: General  Level of Consciousness: awake and alert   Airway and Oxygen Therapy: Patient Spontanous Breathing  Post-op Pain: mild  Post-op Assessment: Post-op Vital signs reviewed, Patient's Cardiovascular Status Stable, Respiratory Function Stable, Patent Airway and No signs of Nausea or vomiting  Post-op Vital Signs: stable  Complications: No apparent anesthesia complications

## 2012-01-07 NOTE — Transfer of Care (Signed)
Immediate Anesthesia Transfer of Care Note  Patient: Alan [name]  Procedure(s) Performed: Procedure(s) (LRB): REMOVAL OF PENILE PROSTHESIS (N/A)  Patient Location: PACU  Anesthesia Type: General  Level of Consciousness: awake, oriented, sedated and patient cooperative  Airway & Oxygen Therapy: Patient Spontanous Breathing and Patient connected to face mask oxygen  Post-op Assessment: Report given to PACU RN and Post -op Vital signs reviewed and stable  Post vital signs: Reviewed and stable  Complications: No apparent anesthesia complications

## 2012-01-07 NOTE — Progress Notes (Signed)
Dr Vernie Ammons in to see pt & evaluate pt's artificial sphincter & checks to evaluate urine draining. Also evaluates penrose drains & explains how to D/C. Pt voices understanding.

## 2012-01-07 NOTE — Anesthesia Preprocedure Evaluation (Signed)
Anesthesia Evaluation  Patient identified by MRN, date of birth, ID band Patient awake    Reviewed: Allergy & Precautions, H&P , NPO status , Patient's Chart, lab work & pertinent test results  Airway Mallampati: II TM Distance: >3 FB Neck ROM: full    Dental No notable dental hx. (+) Dental Advisory Given and Missing,    Pulmonary neg pulmonary ROS,  breath sounds clear to auscultation  Pulmonary exam normal       Cardiovascular Exercise Tolerance: Good negative cardio ROS  Rhythm:regular Rate:Normal     Neuro/Psych negative neurological ROS  negative psych ROS   GI/Hepatic negative GI ROS, Neg liver ROS,   Endo/Other  diabetes, Type 2, Oral Hypoglycemic Agents  Renal/GU negative Renal ROS  negative genitourinary   Musculoskeletal   Abdominal   Peds  Hematology negative hematology ROS (+)   Anesthesia Other Findings   Reproductive/Obstetrics negative OB ROS                           Anesthesia Physical  Anesthesia Plan  ASA: III  Anesthesia Plan: General   Post-op Pain Management:    Induction: Intravenous  Airway Management Planned: LMA  Additional Equipment:   Intra-op Plan:   Post-operative Plan:   Informed Consent: I have reviewed the patients History and Physical, chart, labs and discussed the procedure including the risks, benefits and alternatives for the proposed anesthesia with the patient or authorized representative who has indicated his/her understanding and acceptance.   Dental Advisory Given and Dental advisory given  Plan Discussed with: CRNA and Surgeon  Anesthesia Plan Comments:         Anesthesia Quick Evaluation

## 2012-01-07 NOTE — Progress Notes (Signed)
Pt reports that artificial sphincter is malfunctioning & is not allowing him to void after pumping the device allowing urine to flow as normal, like it was prior to surgery. No acute distress noted. Dr Vernie Ammons to come by to see pt.

## 2012-01-07 NOTE — Interval H&P Note (Signed)
History and Physical Interval Note:  01/07/2012 10:30 AM  Alan [name]  has presented today for surgery, with the diagnosis of Infected Inflatable Penile Prosthesis  The various methods of treatment have been discussed with the patient and family. After consideration of risks, benefits and other options for treatment, the patient has consented to  Procedure(s) (LRB) with comments: REMOVAL OF PENILE PROSTHESIS (N/A) - 90 mins requested for this case  REMOVAL OF INFECTED PROSTHESIS as a surgical intervention .  The patient's history has been reviewed, patient examined, no change in status, stable for surgery.  I have reviewed the patient's chart and labs.  Questions were answered to the patient's satisfaction.     [name]

## 2012-01-07 NOTE — Op Note (Signed)
PATIENT:  Alan [name]  PRE-OPERATIVE DIAGNOSIS:  Infected 3 piece inflatable penile prosthesis  POST-OPERATIVE DIAGNOSIS:  Same  PROCEDURE:  Procedure(s): Removal of infected penile prosthesis  SURGEON:  [name]  INDICATION: Alan [name] is a 71 year old male who developed erectile dysfunction after having undergone a radical prostatectomy in 10/1998. He had a 3 piece penile prosthesis replaced due to malfunction in 5/13. He developed some mild discomfort but had no sign of infection initially but when he returned for a check recently was found to be having significant pain. He also reported purulent drainage from the distal aspect of his penile shaft. He was not having any constitutional symptoms. He therefore is brought to the operating room for what is felt to be almost certain infection of his prosthesis and its removal.  ANESTHESIA:  General  EBL:  Minimal  DRAINS: 1/2 inch drains were placed in the previous location of his reservoir, in his corpus cavernosum both proximally and distally as well. He has a total of 5 drains exiting the scrotum.    SPECIMEN:  aerobic culture  DISPOSITION OF SPECIMEN:  To the lab for culture and sensitivity  Description of procedure:  after informed consent the patient was taken to the major or, placed on the table and administered general anesthesia. His lower abdomen as well as genitalia was sterilely prepped and draped an official timeout was then performed.  A 16 French Foley catheter was then placed in the bladder after I opened his sphincter and locked in the open position. The bladder was drained. I then made a midline incision in the scrotum and carried this down to the pump. Grossly periodic material was encountered and was cultured. I then incised over the pump and delivered this through the scrotal incision. A ring retractor was then placed to afford retraction and I then cut down on the tubing to each of the corpus cavernosum. I  was able to easily incised the corpus cavernosum distally. I did this and then placed stay sutures in the edges for ease of identification. Right angle clamp was then placed beneath the deflated cylinder and it was extracted through the corporotomy. This was performed on both sides in an identical fashion. I then placed a 16 French red rubber catheter down the corpus cavernosum on the right side and irrigated with antibiotic solution copiously. This was then performed on the left side. I then passed the catheter up the corpus cavernosum distally and irrigated again with antibiotic solution. I noted no purulent material returning with irrigation. Of note when I removed his cylinders they were removed with the rear-tip extenders attached. There were 2 rear-tip extenders attached to each of the cylinders.  I then turned my attention to the reservoir. I used the Bovie and cut down on the tubing and was able to eventually identify the neck where the tubing widens and grasped this with a clamp. With tension on this I then was able to visualize the reservoir and I used a scalpel to pierce the reservoir and allow it to drain. Once I had done this and that the reservoir was completely drained I obtained a culture from this location as there appeared to be some periodic material associated with the reservoir. The reservoir was removed and I irrigated this location with the antibiotic solution copiously. I then passed the 16 French rubber catheter into the location where the reservoir had been and irrigated again with antibiotic solution. I then used forceps to pass a Penrose  drain into the first this recess of the pocket where the reservoir had been located. This was then held in position with one of the hooks from the ring retractor in order to prevent it from being dislodged.  I then redirected my attention once again to the cylinders. I passed 1/2 inch Penrose drains distally through the corporotomies out to the tips  of the corporis and then actually irrigated once again through the Penrose drain using antibiotic solution. I passed Penrose drains through the corporotomies into the proximal portions of the corpora as well and irrigated these a similar fashion. I finally irrigated the scrotum itself and to the location where the pump had been with antibiotic solution numerous times. I then loosely reapproximated some of the deep tissue with interrupted 3-0 chromic suture. The skin of the scrotum was reapproximated superiorly and inferiorly with 3-0 chromic suture leaving a wide defect in the midline for the 5 Penrose drains to exit. I placed a 3-0 nylon suture through the scrotal skin and tied a sterile safety pin to this suture and placed this through the 5 Penrose drains. I then applied sterile gauze, a fluffed Curlex and a scrotal support. The patient had his Foley catheter removed. He was awakened and taken to the recovery room in stable and satisfactory condition. He tolerated the procedure well with no intraoperative complications.  I instructed him on how to remove his Penrose drains about 1 inch daily until they were all completely removed. When he returns for wound check the suture that was holding the safety pin in place and likely be removed if all of the drains are gone. I placed him on Cipro postoperatively but did send a culture and will adjust antibiotic therapy according to sensitivities. He'll then need to return to see me in about 4 weeks for a final check.  PLAN OF CARE: Discharge to home after PACU  PATIENT DISPOSITION:  PACU - hemodynamically stable.

## 2012-01-07 NOTE — Anesthesia Procedure Notes (Signed)
Procedure Name: LMA Insertion Date/Time: 01/07/2012 11:08 AM Performed by: Renella Cunas D Pre-anesthesia Checklist: Patient identified, Emergency Drugs available, Suction available and Patient being monitored Patient Re-evaluated:Patient Re-evaluated prior to inductionOxygen Delivery Method: Circle System Utilized Preoxygenation: Pre-oxygenation with 100% oxygen Intubation Type: IV induction Ventilation: Mask ventilation without difficulty LMA: LMA inserted LMA Size: 4.0 Number of attempts: 1 Airway Equipment and Method: bite block Placement Confirmation: positive ETCO2 Tube secured with: Tape Dental Injury: Teeth and Oropharynx as per pre-operative assessment

## 2012-01-10 LAB — WOUND CULTURE: Culture: NO GROWTH

## 2012-01-10 NOTE — Progress Notes (Signed)
Patient complaining that he is unable to pull drains out like he is supposed to starting today, instructed to call Dr. Margrett Rud office a report problems with dressing care.

## 2012-01-12 LAB — ANAEROBIC CULTURE

## 2012-07-05 ENCOUNTER — Other Ambulatory Visit: Payer: Self-pay | Admitting: *Deleted

## 2012-07-05 DIAGNOSIS — N6459 Other signs and symptoms in breast: Secondary | ICD-10-CM

## 2012-07-06 ENCOUNTER — Ambulatory Visit
Admission: RE | Admit: 2012-07-06 | Discharge: 2012-07-06 | Disposition: A | Discharge status: Home / Self Care | Payer: Medicare Other | Source: Ambulatory Visit | Attending: *Deleted | Admitting: *Deleted

## 2012-07-06 DIAGNOSIS — N6459 Other signs and symptoms in breast: Secondary | ICD-10-CM

## 2012-08-28 ENCOUNTER — Encounter (HOSPITAL_BASED_OUTPATIENT_CLINIC_OR_DEPARTMENT_OTHER): Payer: Self-pay | Admitting: *Deleted

## 2012-08-28 ENCOUNTER — Emergency Department (HOSPITAL_BASED_OUTPATIENT_CLINIC_OR_DEPARTMENT_OTHER): Payer: Medicare Other

## 2012-08-28 ENCOUNTER — Emergency Department (HOSPITAL_BASED_OUTPATIENT_CLINIC_OR_DEPARTMENT_OTHER)
Admission: EM | Admit: 2012-08-28 | Discharge: 2012-08-29 | Disposition: A | Discharge status: Home / Self Care | Payer: Medicare Other | Attending: Emergency Medicine | Admitting: Emergency Medicine

## 2012-08-28 DIAGNOSIS — E119 Type 2 diabetes mellitus without complications: Secondary | ICD-10-CM | POA: Insufficient documentation

## 2012-08-28 DIAGNOSIS — H409 Unspecified glaucoma: Secondary | ICD-10-CM | POA: Insufficient documentation

## 2012-08-28 DIAGNOSIS — M129 Arthropathy, unspecified: Secondary | ICD-10-CM | POA: Insufficient documentation

## 2012-08-28 DIAGNOSIS — M62838 Other muscle spasm: Secondary | ICD-10-CM

## 2012-08-28 DIAGNOSIS — Z87448 Personal history of other diseases of urinary system: Secondary | ICD-10-CM | POA: Insufficient documentation

## 2012-08-28 DIAGNOSIS — Y93E5 Activity, floor mopping and cleaning: Secondary | ICD-10-CM | POA: Insufficient documentation

## 2012-08-28 DIAGNOSIS — Z79899 Other long term (current) drug therapy: Secondary | ICD-10-CM | POA: Insufficient documentation

## 2012-08-28 DIAGNOSIS — Y929 Unspecified place or not applicable: Secondary | ICD-10-CM | POA: Insufficient documentation

## 2012-08-28 DIAGNOSIS — S139XXA Sprain of joints and ligaments of unspecified parts of neck, initial encounter: Secondary | ICD-10-CM | POA: Insufficient documentation

## 2012-08-28 DIAGNOSIS — X503XXA Overexertion from repetitive movements, initial encounter: Secondary | ICD-10-CM | POA: Insufficient documentation

## 2012-08-28 MED ORDER — DIAZEPAM 5 MG PO TABS
5.0000 mg | ORAL_TABLET | Freq: Once | ORAL | Status: AC
  Administered 2012-08-29: 5 mg via ORAL
  Filled 2012-08-28: qty 1

## 2012-08-28 MED ORDER — OXYCODONE-ACETAMINOPHEN 5-325 MG PO TABS
2.0000 | ORAL_TABLET | Freq: Once | ORAL | Status: AC
  Administered 2012-08-29: 2 via ORAL
  Filled 2012-08-28 (×2): qty 2

## 2012-08-28 MED ORDER — IBUPROFEN 800 MG PO TABS
800.0000 mg | ORAL_TABLET | Freq: Once | ORAL | Status: AC
  Administered 2012-08-29: 800 mg via ORAL
  Filled 2012-08-28: qty 1

## 2012-08-28 NOTE — ED Provider Notes (Signed)
History  This chart was scribed for Glynn Octave, MD by Shari Heritage, ED Scribe. The patient was seen in room MH10/MH10. Patient's care was started at 2331.   CSN: [id number]  Arrival date & time 08/28/12  2302   First MD Initiated Contact with Patient 08/28/12 2331      Chief Complaint  Patient presents with  . Neck Injury    The history is provided by the patient. No language interpreter was used.    HPI Comments: Alan [name] is a 72 y.o. male with history of diabetes, arthritis, stress urinary incontinence who presents to the Emergency Department complaining of moderate to severe, constant neck pain and posterior shoulder onset 2 days ago. Pain is worse in the left neck. Patient denies any injury, but states that he was washing a wall on Friday and Saturday. He reports that his head was lifted upwards during this activity for several hours at a time. The pain has been present since Saturday night. He has applied heat with transient relief. There is associated nausea. There is no numbness, tingling or weakness of the left arm. Patient denies fever, headache, chest pain, abdominal pain, vomiting, diarrhea, or any other symptoms. Patient does not smoke or use alcohol.  PCP - Nonda Lou  Past Medical History  Diagnosis Date  . Malfunction of penile prosthesis   . SUI (stress urinary incontinence), male   . ED (erectile dysfunction)   . Diabetes mellitus ORAL MED  . Arthritis   . Nocturia   . Malfunction of artificial urethral sphincter   . Glaucoma of right eye     Past Surgical History  Procedure Laterality Date  . I & d/ orif right index finger  01-19-2005  . Removal and replacement gu sphinter cuff  05-15-2004    STRESS URINARY INCONTINENCE  . Placement artificial urinary sphinter  01-20-2001    [id number]  . Insertion inflatable penile prosthesis  06-22-1999    MENTOR ALPHA I  . Penile prosthesis implant  09/24/2011    Procedure: PENILE PROTHESIS INFLATABLE;   Surgeon: [name], MD;  Location: [facility];  Service: Urology;  Laterality: N/A;  REPLACEMENT OF INFLATABLE PENILE PROTHESIS (COLOPLAST)      No family history on file.  History  Substance Use Topics  . Smoking status: Never Smoker   . Smokeless tobacco: Never Used  . Alcohol Use: No      Review of Systems A complete 10 system review of systems was obtained and all systems are negative except as noted in the HPI and PMH.   Allergies  Review of patient's allergies indicates no known allergies.  Home Medications   Current Outpatient Rx  Name  Route  Sig  Dispense  Refill  . b complex vitamins tablet   Oral   Take 1 tablet by mouth daily.         . cholecalciferol (VITAMIN D) 1000 UNITS tablet   Oral   Take 1,000 Units by mouth daily.         . diazepam (VALIUM) 5 MG tablet   Oral   Take 1 tablet (5 mg total) by mouth every 8 (eight) hours as needed (spasm).   6 tablet   0   . doxycycline (DORYX) 100 MG EC tablet   Oral   Take 100 mg by mouth 2 (two) times daily.         Marland Kitchen glipiZIDE (GLUCOTROL) 5 MG tablet   Oral   Take 5  mg by mouth daily.         Marland Kitchen HYDROcodone-acetaminophen (NORCO/VICODIN) 5-325 MG per tablet   Oral   Take 1 tablet by mouth every 6 (six) hours as needed.         Marland Kitchen ibuprofen (ADVIL,MOTRIN) 800 MG tablet   Oral   Take 1 tablet (800 mg total) by mouth 3 (three) times daily.   21 tablet   0   . latanoprost (XALATAN) 0.005 % ophthalmic solution   Right Eye   Place 1 drop into the right eye at bedtime.         . meclizine (ANTIVERT) 25 MG tablet   Oral   Take 25 mg by mouth every morning.         . metFORMIN (GLUCOPHAGE-XR) 500 MG 24 hr tablet   Oral   Take 500 mg by mouth daily with breakfast.         . oxyCODONE-acetaminophen (PERCOCET/ROXICET) 5-325 MG per tablet   Oral   Take 2 tablets by mouth every 4 (four) hours as needed for pain.   15 tablet   0     Triage Vitals: BP 145/58  Pulse  80  Temp(Src) 99.2 F (37.3 C) (Oral)  Resp 18  Wt 225 lb (102.059 kg)  BMI 32.28 kg/m2  SpO2 94%  Physical Exam  Constitutional: He is oriented to person, place, and time. He appears well-developed and well-nourished.  HENT:  Head: Normocephalic and atraumatic.  Eyes: Conjunctivae and EOM are normal. Pupils are equal, round, and reactive to light.  Neck: Normal range of motion. Neck supple.  L paraspinal and upper trapezius spasm.  Cardiovascular: Normal rate, regular rhythm and normal heart sounds.   Pulmonary/Chest: Effort normal and breath sounds normal.  Abdominal: Soft. There is no tenderness.  Musculoskeletal: Normal range of motion. He exhibits tenderness.  Left paraspinal cervical and upper trapezius spasm. Equal grip strengths. Equal push-pull.  Neurological: He is alert and oriented to person, place, and time. No cranial nerve deficit. He exhibits normal muscle tone. Coordination normal.  5/5 strength throughout, equal grip strengths.  Sensation intact.  Skin: Skin is warm and dry.  Psychiatric: He has a normal mood and affect. His behavior is normal.    ED Course  Procedures (including critical care time) DIAGNOSTIC STUDIES: Oxygen Saturation is 94% on room air, adequate by my interpretation.    COORDINATION OF CARE: 11:35 PM- Patient informed of current plan for treatment and evaluation and agrees with plan at this time.      Labs Reviewed  BASIC METABOLIC PANEL - Abnormal; Notable for the following:    Glucose, Bld 234 (*)    GFR calc non Af Amer 74 (*)    GFR calc Af Amer 86 (*)    All other components within normal limits  CBC WITH DIFFERENTIAL - Abnormal; Notable for the following:    Monocytes Relative 13 (*)    All other components within normal limits  TROPONIN I   Ct Angio Head W/cm &/or Wo Cm  08/29/2012  *RADIOLOGY REPORT*  Clinical Data:  72 year old male with left neck pain after painting.  Possible dissection.  CT ANGIOGRAPHY HEAD AND NECK   Technique:  Multidetector CT imaging of the head and neck was performed using the standard protocol during bolus administration of intravenous contrast.  Multiplanar CT image reconstructions including MIPs were obtained to evaluate the vascular anatomy. Carotid stenosis measurements (when applicable) are obtained utilizing NASCET criteria, using the distal internal carotid  diameter as the denominator.  Contrast:  80 ml Omnipaque 350.  Comparison:  Cervical radiographs 08/29/2012.  CTA NECK  Findings:  Mild dependent atelectasis in the lung apices. Mediastinal lipomatosis.  18 mm hypodense right thyroid lobe nodule, significance doubtful in this age group.  Mild retrosternal extension of the left thyroid lobe, but with no discrete nodule not lobe.  No superior mediastinal lymphadenopathy.  Gas in the thoracic esophagus.  Larynx, pharynx, parapharyngeal spaces, retropharyngeal space, sublingual space, submandibular glands and parotid glands are within normal limits. Visualized orbit soft tissues are within normal limits.  Left ethmoid sinus mucosal thickening.  Minimal left maxillary sinus mucosal thickening. Other Visualized paranasal sinuses and mastoids are clear.  No cervical lymphadenopathy.  Degenerative changes in the cervical spine, especially severe left facet arthropathy. The bilateral C2- C3 posterior elements appear to be ankylosed.  No acute osseous abnormality identified.  Vascular Findings: Bovine arch configuration.  No arch or great vessel origin atherosclerosis.  Tortuous proximal great vessels.  Tortuous but otherwise negative right CCA origin.  Negative right CCA.  Mild atherosclerosis of the right carotid bifurcation. Widely patent right ICA origin and bulb.  Mild tortuosity of the cervical right ICA.  No proximal right subclavian artery stenosis.  Normal right vertebral artery origin.  Negative cervical right vertebral artery.  Normal left CCA origin.  Negative left CCA to the bifurcation. Widely  patent left carotid bifurcation.  Mild plaque in the mid to distal left ICA bulb.  No subsequent stenosis.  Tortuous but otherwise negative cervical left ICA.  No proximal left subclavian artery stenosis.  Calcified atherosclerosis at the left vertebral artery origin resulting in mild origin stenosis.  The proximal left vertebral artery also is tortuous.  Left vertebral artery remains patent and is slightly nondominant compared to the right.  Otherwise normal cervical left vertebral artery.   Review of the MIP images confirms the above findings.  IMPRESSION: 1.  No arterial dissection in the neck. 2.  Minimal carotid atherosclerosis. Tortuous great vessels and cervical ICAs. 3.  Mild atherosclerotic stenosis of the left vertebral artery origin. 4.  Intracranial findings are below. 5.  Severe left cervical facet arthropathy.  CTA HEAD  Findings:  Visualized scalp soft tissues are within normal limits. Calvarium intact. Cerebral volume is within normal limits for age. No midline shift, ventriculomegaly, mass effect, evidence of mass lesion, intracranial hemorrhage or evidence of cortically based acute infarction.  Gray-white matter differentiation is within normal limits throughout the brain.  No abnormal enhancement identified.  Vascular Findings: Major intracranial venous structures are enhancing.  Patent distal vertebral arteries.  Minor atherosclerosis on the left.  Normal PICA origins.  No distal vertebral artery stenosis. Patent vertebrobasilar junction.  No basilar stenosis.  SCA and PCA origins are within normal limits.  Posterior communicating arteries are diminutive or absent.  Bilateral PCA branches are within normal limits.  Both ICA siphons are patent with intermittent cavernous and supraclinoid calcified atherosclerosis.  No hemodynamically significant stenosis.  Ophthalmic artery origins are within normal limits.  Carotid termini are patent.  MCA and ACA origins are within normal limits.  Diminutive or  absent anterior communicating artery.  ACA branches are within normal limits.  Left MCA M1 segments are patent.  Early branching on the right. Bilateral MCA branches are within normal limits; incidental early branching on the left and simple fide branching pattern on the right.   Review of the MIP images confirms the above findings.  IMPRESSION: 1. Negative intracranial CTA.  2.  Normal for age CT appearance of the brain.   Original Report Authenticated By: Erskine Speed, M.D.    Dg Cervical Spine Complete  08/29/2012  *RADIOLOGY REPORT*  Clinical Data: Neck injury  CERVICAL SPINE - COMPLETE 4+ VIEW  Comparison: None.  Findings: No prevertebral soft tissue swelling.  Normal alignment of the cervical vertebral bodies.  There is endplate osteophytosis. Normal spinal laminar line.  No traumatic narrowing of the neural foramina. Open mouth odontoid view demonstrates normal alignment of the lateral masses of C1 on C2.  IMPRESSION:  1.  No evidence of cervical spine fracture. 2.  Multilevel disc osteophytic disease.   Original Report Authenticated By: Genevive Bi, M.D.    Ct Angio Neck W/cm &/or Wo/cm  08/29/2012  *RADIOLOGY REPORT*  Clinical Data:  72 year old male with left neck pain after painting.  Possible dissection.  CT ANGIOGRAPHY HEAD AND NECK  Technique:  Multidetector CT imaging of the head and neck was performed using the standard protocol during bolus administration of intravenous contrast.  Multiplanar CT image reconstructions including MIPs were obtained to evaluate the vascular anatomy. Carotid stenosis measurements (when applicable) are obtained utilizing NASCET criteria, using the distal internal carotid diameter as the denominator.  Contrast:  80 ml Omnipaque 350.  Comparison:  Cervical radiographs 08/29/2012.  CTA NECK  Findings:  Mild dependent atelectasis in the lung apices. Mediastinal lipomatosis.  18 mm hypodense right thyroid lobe nodule, significance doubtful in this age group.  Mild  retrosternal extension of the left thyroid lobe, but with no discrete nodule not lobe.  No superior mediastinal lymphadenopathy.  Gas in the thoracic esophagus.  Larynx, pharynx, parapharyngeal spaces, retropharyngeal space, sublingual space, submandibular glands and parotid glands are within normal limits. Visualized orbit soft tissues are within normal limits.  Left ethmoid sinus mucosal thickening.  Minimal left maxillary sinus mucosal thickening. Other Visualized paranasal sinuses and mastoids are clear.  No cervical lymphadenopathy.  Degenerative changes in the cervical spine, especially severe left facet arthropathy. The bilateral C2- C3 posterior elements appear to be ankylosed.  No acute osseous abnormality identified.  Vascular Findings: Bovine arch configuration.  No arch or great vessel origin atherosclerosis.  Tortuous proximal great vessels.  Tortuous but otherwise negative right CCA origin.  Negative right CCA.  Mild atherosclerosis of the right carotid bifurcation. Widely patent right ICA origin and bulb.  Mild tortuosity of the cervical right ICA.  No proximal right subclavian artery stenosis.  Normal right vertebral artery origin.  Negative cervical right vertebral artery.  Normal left CCA origin.  Negative left CCA to the bifurcation. Widely patent left carotid bifurcation.  Mild plaque in the mid to distal left ICA bulb.  No subsequent stenosis.  Tortuous but otherwise negative cervical left ICA.  No proximal left subclavian artery stenosis.  Calcified atherosclerosis at the left vertebral artery origin resulting in mild origin stenosis.  The proximal left vertebral artery also is tortuous.  Left vertebral artery remains patent and is slightly nondominant compared to the right.  Otherwise normal cervical left vertebral artery.   Review of the MIP images confirms the above findings.  IMPRESSION: 1.  No arterial dissection in the neck. 2.  Minimal carotid atherosclerosis. Tortuous great vessels and  cervical ICAs. 3.  Mild atherosclerotic stenosis of the left vertebral artery origin. 4.  Intracranial findings are below. 5.  Severe left cervical facet arthropathy.  CTA HEAD  Findings:  Visualized scalp soft tissues are within normal limits. Calvarium intact. Cerebral volume is  within normal limits for age. No midline shift, ventriculomegaly, mass effect, evidence of mass lesion, intracranial hemorrhage or evidence of cortically based acute infarction.  Gray-white matter differentiation is within normal limits throughout the brain.  No abnormal enhancement identified.  Vascular Findings: Major intracranial venous structures are enhancing.  Patent distal vertebral arteries.  Minor atherosclerosis on the left.  Normal PICA origins.  No distal vertebral artery stenosis. Patent vertebrobasilar junction.  No basilar stenosis.  SCA and PCA origins are within normal limits.  Posterior communicating arteries are diminutive or absent.  Bilateral PCA branches are within normal limits.  Both ICA siphons are patent with intermittent cavernous and supraclinoid calcified atherosclerosis.  No hemodynamically significant stenosis.  Ophthalmic artery origins are within normal limits.  Carotid termini are patent.  MCA and ACA origins are within normal limits.  Diminutive or absent anterior communicating artery.  ACA branches are within normal limits.  Left MCA M1 segments are patent.  Early branching on the right. Bilateral MCA branches are within normal limits; incidental early branching on the left and simple fide branching pattern on the right.   Review of the MIP images confirms the above findings.  IMPRESSION: 1. Negative intracranial CTA. 2.  Normal for age CT appearance of the brain.   Original Report Authenticated By: Odessa Fleming III, M.D.      1. Cervical paraspinal muscle spasm       MDM  3 days of left-sided neck and upper back pain after cleaning an awning. No weakness, numbness or tingling. No chest pain or  shortness of breath.  Equal grip strengths bilaterally.  No weakness, numbness, tingling.  No fracture or dislocation on Xray. CTA obtained to r/o vertebral dissection. Presentation consistent with musculoskeletal spasm. Pain improved after NSAIDs, narcotics and valium.  Patient counseled on not driving or operating heavy machinery on these medications.    Date: 08/29/2012  Rate: 81  Rhythm: normal sinus rhythm  QRS Axis: left  Intervals: normal  ST/T Wave abnormalities: nonspecific ST/T changes  Conduction Disutrbances:none  Narrative Interpretation: T wave inversions inferior laterally.  Old EKG Reviewed: changes noted     I personally performed the services described in this documentation, which was scribed in my presence. The recorded information has been reviewed and is accurate.     Glynn Octave, MD 08/29/12 [phone]

## 2012-08-28 NOTE — ED Notes (Signed)
States 2 days ago he was washing a wall for an hour looking up the entire time. Has had neck pain since. No relief with heat.

## 2012-08-29 ENCOUNTER — Emergency Department (HOSPITAL_BASED_OUTPATIENT_CLINIC_OR_DEPARTMENT_OTHER): Payer: Medicare Other

## 2012-08-29 LAB — BASIC METABOLIC PANEL
CO2: 24 mEq/L (ref 19–32)
Calcium: 9.6 mg/dL (ref 8.4–10.5)
Creatinine, Ser: 1 mg/dL (ref 0.50–1.35)
GFR calc Af Amer: 86 mL/min — ABNORMAL LOW (ref >90)
GFR calc non Af Amer: 74 mL/min — ABNORMAL LOW (ref >90)
Sodium: 138 mEq/L (ref 135–145)

## 2012-08-29 LAB — CBC WITH DIFFERENTIAL/PLATELET
Basophils Absolute: 0 10*3/uL (ref 0.0–0.1)
Eosinophils Relative: 3 % (ref 0–5)
Lymphocytes Relative: 30 % (ref 12–46)
Lymphs Abs: 2.2 10*3/uL (ref 0.7–4.0)
MCV: 84 fL (ref 78.0–100.0)
Neutrophils Relative %: 53 % (ref 43–77)
Platelets: 167 10*3/uL (ref 150–400)
RBC: 4.7 MIL/uL (ref 4.22–5.81)
RDW: 14.2 % (ref 11.5–15.5)
WBC: 7.3 10*3/uL (ref 4.0–10.5)

## 2012-08-29 LAB — TROPONIN I: Troponin I: <0.3 ng/mL (ref <0.30)

## 2012-08-29 MED ORDER — IBUPROFEN 800 MG PO TABS: 800.0000 mg | ORAL_TABLET | Freq: Three times a day (TID) | ORAL | Status: DC

## 2012-08-29 MED ORDER — DIAZEPAM 5 MG PO TABS: 5.0000 mg | ORAL_TABLET | Freq: Three times a day (TID) | ORAL | Status: DC | PRN

## 2012-08-29 MED ORDER — IOHEXOL 350 MG/ML SOLN
100.0000 mL | Freq: Once | INTRAVENOUS | Status: AC | PRN
  Administered 2012-08-29: 80 mL via INTRAVENOUS

## 2012-08-29 MED ORDER — OXYCODONE-ACETAMINOPHEN 5-325 MG PO TABS: 2.0000 | ORAL_TABLET | ORAL | Status: DC | PRN

## 2013-02-15 ENCOUNTER — Encounter: Payer: Self-pay | Admitting: Cardiovascular Disease

## 2013-03-06 ENCOUNTER — Telehealth: Payer: Self-pay | Admitting: Cardiology

## 2013-03-06 NOTE — Telephone Encounter (Signed)
New Problem:  Pt states he is returning [facility] phone call. Please advise

## 2013-03-09 ENCOUNTER — Ambulatory Visit (INDEPENDENT_AMBULATORY_CARE_PROVIDER_SITE_OTHER): Payer: Medicare Other | Admitting: Internal Medicine

## 2013-03-09 ENCOUNTER — Encounter: Payer: Self-pay | Admitting: Internal Medicine

## 2013-03-09 ENCOUNTER — Telehealth: Payer: Self-pay | Admitting: *Deleted

## 2013-03-09 VITALS — BP 132/62 | HR 62 | Ht 70.0 in | Wt 229.4 lb

## 2013-03-09 DIAGNOSIS — R9389 Abnormal findings on diagnostic imaging of other specified body structures: Secondary | ICD-10-CM

## 2013-03-09 DIAGNOSIS — R9431 Abnormal electrocardiogram [ECG] [EKG]: Secondary | ICD-10-CM

## 2013-03-09 DIAGNOSIS — R931 Abnormal findings on diagnostic imaging of heart and coronary circulation: Secondary | ICD-10-CM

## 2013-03-09 DIAGNOSIS — E119 Type 2 diabetes mellitus without complications: Secondary | ICD-10-CM

## 2013-03-09 DIAGNOSIS — R079 Chest pain, unspecified: Secondary | ICD-10-CM

## 2013-03-09 NOTE — Progress Notes (Signed)
HPI Patinet is a 72 yo who is referred for abnormal EKG, echo He is followed by Dr Barnes.   The patinet notes occasional chest pains.  Not associated with activity.  Walks about 1 mile per day  Does have some SOB He was seen at the [facility]  Echo showed LVEF 45% with inferolateral hypkinesis   Allergies  Allergen Reactions  . Avandia [Rosiglitazone] Other (See Comments)    Caused heart issues    Current Outpatient Prescriptions  Medication Sig Dispense Refill  . b complex vitamins tablet Take 1 tablet by mouth daily.      . cholecalciferol (VITAMIN D) 1000 UNITS tablet Take 1,000 Units by mouth daily.      . glipiZIDE (GLUCOTROL) 5 MG tablet Take 5 mg by mouth daily. Take 1/2 tablet in the morning and 1 tablet in the evening      . latanoprost (XALATAN) 0.005 % ophthalmic solution Place 1 drop into the right eye at bedtime.       No current facility-administered medications for this visit.    Past Medical History  Diagnosis Date  . Malfunction of penile prosthesis   . SUI (stress urinary incontinence), male   . ED (erectile dysfunction)   . Diabetes mellitus ORAL MED  . Arthritis   . Nocturia   . Malfunction of artificial urethral sphincter   . Glaucoma of right eye     Past Surgical History  Procedure Laterality Date  . I & d/ orif right index finger  01-19-2005  . Removal and replacement gu sphinter cuff  05-15-2004    STRESS URINARY INCONTINENCE  . Placement artificial urinary sphinter  01-20-2001    [id number]  . Insertion inflatable penile prosthesis  06-22-1999    MENTOR ALPHA I  . Penile prosthesis implant  09/24/2011    Procedure: PENILE PROTHESIS INFLATABLE;  Surgeon: Mark C Ottelin, MD;  Location: [facility];  Service: Urology;  Laterality: N/A;  REPLACEMENT OF INFLATABLE PENILE PROTHESIS (COLOPLAST)      No family history on file.  History   Social History  . Marital Status: Divorced    Spouse Name: N/A    Number of Children: N/A   . Years of Education: N/A   Occupational History  . Not on file.   Social History Main Topics  . Smoking status: Never Smoker   . Smokeless tobacco: Never Used  . Alcohol Use: No  . Drug Use: No  . Sexual Activity:    Other Topics Concern  . Not on file   Social History Narrative  . No narrative on file    Review of Systems:  All systems reviewed.  They are negative to the above problem except as previously stated.  Vital Signs: BP 132/62  Pulse 62  Ht 5' 10" (1.778 m)  Wt 229 lb 6.4 oz (104.055 kg)  BMI 32.92 kg/m2  Physical Exam Patinet is in NAD HEENT:  Normocephalic, atraumatic. EOMI, PERRLA.  Neck: JVP is normal.  No bruits.  Lungs: clear to auscultation. No rales no wheezes.  Heart: Regular rate and rhythm. Normal S1, S2. No S3.   Gr II/Vi systolic murmur LSB. PMI not displaced.  Abdomen:  Supple, nontender. Normal bowel sounds. No masses. No hepatomegaly.  Extremities:   Good distal pulses throughout. No lower extremity edema.  Musculoskeletal :moving all extremities.  Neuro:   alert and oriented x3.  CN II-XII grossly intact.  EKG  Sr 62  First degree AV block 210   msec.  Septal infarct.  Sl ST depression, T wave inversion II, III, AF, V4 to V6.  Present in EKG from 5/14. Assessment and Plan:  1.  Abnormal EKG/echo  Echo report shows inferolateral hypokinesis with mildly depressed LVEF   I would recomm L heart cath to evaluate anatomy  Discussed risk/benefits  Patinet agrees to proceed.    2.  DM  On oral agents  3.  HCM  Need t oget lipids.    

## 2013-03-09 NOTE — Telephone Encounter (Signed)
Echo results received and reviewed by dr Tenny Craw. Pt needs to be scheduled for a cath next week. Will call pt back to get scheduled.

## 2013-03-12 ENCOUNTER — Telehealth: Payer: Self-pay | Admitting: *Deleted

## 2013-03-12 ENCOUNTER — Encounter: Payer: Self-pay | Admitting: Nurse Practitioner

## 2013-03-12 ENCOUNTER — Encounter (HOSPITAL_COMMUNITY): Payer: Self-pay | Admitting: Pharmacy Technician

## 2013-03-12 ENCOUNTER — Other Ambulatory Visit: Payer: Self-pay | Admitting: Dermatology

## 2013-03-12 NOTE — Telephone Encounter (Signed)
Spoke with patient who states he needs to schedule heart cath.  I discussed dates/times with patient who states he has to arrange for someone to stay with him.  I advised patient I will call [facility] Cath lab for availability and will call him back. I scheduled left heart cath with [facility] in [facility] Cath lab for Thursday 11/20 at 1:30.  Dr. Tenny Craw aware and will write orders and discuss with Dr. Shirlee Latch who will perform cath. I called patient back and he confirmed left heart cath Thursday 11/20 at [facility].  Patient scheduled for lab tomorrow 11/18 at [facility]. I advised patient to pick up pre-cath instructions when he comes in for labs and I reviewed these with him on the telephone as well.   Labs ordered in epic and patient verbalized understanding of all instructions. Routing message to Dr. Tenny Craw for orders per her request.

## 2013-03-12 NOTE — Telephone Encounter (Signed)
Follow Up  Pt called to follow up on scheduling for Cath// Wants to be sure that it is scheduled for this week// and he asks that you call him before the appointment is scheduled so that he can arrange transportation for this visit.Marland Kitchen Please call.

## 2013-03-13 ENCOUNTER — Other Ambulatory Visit (INDEPENDENT_AMBULATORY_CARE_PROVIDER_SITE_OTHER): Payer: Medicare Other

## 2013-03-13 DIAGNOSIS — R079 Chest pain, unspecified: Secondary | ICD-10-CM

## 2013-03-13 DIAGNOSIS — R9431 Abnormal electrocardiogram [ECG] [EKG]: Secondary | ICD-10-CM

## 2013-03-13 LAB — CBC WITH DIFFERENTIAL/PLATELET
Basophils Relative: 1 % (ref 0.0–3.0)
Eosinophils Relative: 3.6 % (ref 0.0–5.0)
Hemoglobin: 14.8 g/dL (ref 13.0–17.0)
Lymphocytes Relative: 23.5 % (ref 12.0–46.0)
Neutro Abs: 3.6 10*3/uL (ref 1.4–7.7)
Neutrophils Relative %: 61.6 % (ref 43.0–77.0)
RBC: 5.13 Mil/uL (ref 4.22–5.81)
WBC: 5.8 10*3/uL (ref 4.5–10.5)

## 2013-03-13 LAB — PROTIME-INR: INR: 1 ratio (ref 0.8–1.0)

## 2013-03-13 LAB — BASIC METABOLIC PANEL
Calcium: 9.3 mg/dL (ref 8.4–10.5)
Chloride: 107 mEq/L (ref 96–112)
Creatinine, Ser: 1 mg/dL (ref 0.4–1.5)
Sodium: 139 mEq/L (ref 135–145)

## 2013-03-13 NOTE — Telephone Encounter (Signed)
Left message for pt to call.

## 2013-03-13 NOTE — Telephone Encounter (Signed)
New problem   Pt stated he is expecting a call regarding where he need to go for his procedure and what time. Pt is very upset because he haven't heard from anyone. He stated he was told to call and ask for [facility].  Please call pt.

## 2013-03-13 NOTE — Telephone Encounter (Signed)
Spoke with pt, instructions regarding cath on 11/20 discussed with patient. Copy of letter mailed to pt at his request

## 2013-03-15 ENCOUNTER — Encounter (HOSPITAL_COMMUNITY)
Admission: RE | Disposition: A | Discharge status: Home / Self Care | Payer: Self-pay | Source: Ambulatory Visit | Attending: Cardiology

## 2013-03-15 ENCOUNTER — Ambulatory Visit (HOSPITAL_COMMUNITY)
Admission: RE | Admit: 2013-03-15 | Discharge: 2013-03-15 | Disposition: A | Discharge status: Home / Self Care | Payer: Medicare Other | Source: Ambulatory Visit | Attending: Cardiology | Admitting: Cardiology

## 2013-03-15 ENCOUNTER — Encounter (HOSPITAL_COMMUNITY): Payer: Self-pay | Admitting: *Deleted

## 2013-03-15 DIAGNOSIS — R9389 Abnormal findings on diagnostic imaging of other specified body structures: Secondary | ICD-10-CM | POA: Insufficient documentation

## 2013-03-15 DIAGNOSIS — R0602 Shortness of breath: Secondary | ICD-10-CM

## 2013-03-15 DIAGNOSIS — I519 Heart disease, unspecified: Secondary | ICD-10-CM | POA: Insufficient documentation

## 2013-03-15 DIAGNOSIS — I428 Other cardiomyopathies: Secondary | ICD-10-CM | POA: Insufficient documentation

## 2013-03-15 DIAGNOSIS — E119 Type 2 diabetes mellitus without complications: Secondary | ICD-10-CM | POA: Insufficient documentation

## 2013-03-15 DIAGNOSIS — R9431 Abnormal electrocardiogram [ECG] [EKG]: Secondary | ICD-10-CM | POA: Insufficient documentation

## 2013-03-15 HISTORY — PX: LEFT HEART CATHETERIZATION WITH CORONARY ANGIOGRAM: SHX5451

## 2013-03-15 LAB — GLUCOSE, CAPILLARY: Glucose-Capillary: 112 mg/dL — ABNORMAL HIGH (ref 70–99)

## 2013-03-15 SURGERY — LEFT HEART CATHETERIZATION WITH CORONARY ANGIOGRAM
Anesthesia: LOCAL

## 2013-03-15 MED ORDER — SODIUM CHLORIDE 0.9 % IV SOLN
INTRAVENOUS | Status: DC
  Administered 2013-03-15: 50 mL/h via INTRAVENOUS

## 2013-03-15 MED ORDER — HEPARIN (PORCINE) IN NACL 2-0.9 UNIT/ML-% IJ SOLN
INTRAMUSCULAR | Status: AC
  Filled 2013-03-15: qty 1000

## 2013-03-15 MED ORDER — ONDANSETRON HCL 4 MG/2ML IJ SOLN: 4.0000 mg | Freq: Four times a day (QID) | INTRAMUSCULAR | Status: DC | PRN

## 2013-03-15 MED ORDER — MIDAZOLAM HCL 2 MG/2ML IJ SOLN
INTRAMUSCULAR | Status: AC
  Filled 2013-03-15: qty 2

## 2013-03-15 MED ORDER — SODIUM CHLORIDE 0.9 % IV SOLN: 250.0000 mL | INTRAVENOUS | Status: DC | PRN

## 2013-03-15 MED ORDER — HEPARIN SODIUM (PORCINE) 1000 UNIT/ML IJ SOLN
INTRAMUSCULAR | Status: AC
  Filled 2013-03-15: qty 1

## 2013-03-15 MED ORDER — ACETAMINOPHEN 325 MG PO TABS: 650.0000 mg | ORAL_TABLET | ORAL | Status: DC | PRN

## 2013-03-15 MED ORDER — FENTANYL CITRATE 0.05 MG/ML IJ SOLN
INTRAMUSCULAR | Status: AC
  Filled 2013-03-15: qty 2

## 2013-03-15 MED ORDER — SODIUM CHLORIDE 0.9 % IJ SOLN: 3.0000 mL | Freq: Two times a day (BID) | INTRAMUSCULAR | Status: DC

## 2013-03-15 MED ORDER — VERAPAMIL HCL 2.5 MG/ML IV SOLN
INTRAVENOUS | Status: AC
  Filled 2013-03-15: qty 2

## 2013-03-15 MED ORDER — SODIUM CHLORIDE 0.9 % IV SOLN: 1.0000 mL/kg/h | INTRAVENOUS | Status: DC

## 2013-03-15 MED ORDER — LIDOCAINE HCL (PF) 1 % IJ SOLN
INTRAMUSCULAR | Status: AC
  Filled 2013-03-15: qty 30

## 2013-03-15 MED ORDER — ASPIRIN 81 MG PO CHEW
CHEWABLE_TABLET | ORAL | Status: AC
  Administered 2013-03-15: 81 mg via OROMUCOSAL
  Filled 2013-03-15: qty 1

## 2013-03-15 MED ORDER — ASPIRIN 81 MG PO CHEW: 81.0000 mg | CHEWABLE_TABLET | ORAL | Status: DC

## 2013-03-15 MED ORDER — NITROGLYCERIN 0.2 MG/ML ON CALL CATH LAB
INTRAVENOUS | Status: AC
  Filled 2013-03-15: qty 1

## 2013-03-15 MED ORDER — SODIUM CHLORIDE 0.9 % IJ SOLN: 3.0000 mL | INTRAMUSCULAR | Status: DC | PRN

## 2013-03-15 NOTE — Telephone Encounter (Signed)
Patient called in to update his med list.

## 2013-03-15 NOTE — H&P (View-Only) (Signed)
HPI Patinet is a 72 yo who is referred for abnormal EKG, echo He is followed by Dr Zachery Dauer.   The patinet notes occasional chest pains.  Not associated with activity.  Walks about 1 mile per day  Does have some SOB He was seen at the [facility]  Echo showed LVEF 45% with inferolateral hypkinesis   Allergies  Allergen Reactions  . Avandia [Rosiglitazone] Other (See Comments)    Caused heart issues    Current Outpatient Prescriptions  Medication Sig Dispense Refill  . b complex vitamins tablet Take 1 tablet by mouth daily.      . cholecalciferol (VITAMIN D) 1000 UNITS tablet Take 1,000 Units by mouth daily.      Marland Kitchen glipiZIDE (GLUCOTROL) 5 MG tablet Take 5 mg by mouth daily. Take 1/2 tablet in the morning and 1 tablet in the evening      . latanoprost (XALATAN) 0.005 % ophthalmic solution Place 1 drop into the right eye at bedtime.       No current facility-administered medications for this visit.    Past Medical History  Diagnosis Date  . Malfunction of penile prosthesis   . SUI (stress urinary incontinence), male   . ED (erectile dysfunction)   . Diabetes mellitus ORAL MED  . Arthritis   . Nocturia   . Malfunction of artificial urethral sphincter   . Glaucoma of right eye     Past Surgical History  Procedure Laterality Date  . I & d/ orif right index finger  01-19-2005  . Removal and replacement gu sphinter cuff  05-15-2004    STRESS URINARY INCONTINENCE  . Placement artificial urinary sphinter  01-20-2001    [id number]  . Insertion inflatable penile prosthesis  06-22-1999    MENTOR ALPHA I  . Penile prosthesis implant  09/24/2011    Procedure: PENILE PROTHESIS INFLATABLE;  Surgeon: [name], MD;  Location: [facility];  Service: Urology;  Laterality: N/A;  REPLACEMENT OF INFLATABLE PENILE PROTHESIS (COLOPLAST)      No family history on file.  History   Social History  . Marital Status: Divorced    Spouse Name: N/A    Number of Children: N/A   . Years of Education: N/A   Occupational History  . Not on file.   Social History Main Topics  . Smoking status: Never Smoker   . Smokeless tobacco: Never Used  . Alcohol Use: No  . Drug Use: No  . Sexual Activity:    Other Topics Concern  . Not on file   Social History Narrative  . No narrative on file    Review of Systems:  All systems reviewed.  They are negative to the above problem except as previously stated.  Vital Signs: BP 132/62  Pulse 62  Ht 5\' 10"  (1.778 m)  Wt 229 lb 6.4 oz (104.055 kg)  BMI 32.92 kg/m2  Physical Exam Patinet is in NAD HEENT:  Normocephalic, atraumatic. EOMI, PERRLA.  Neck: JVP is normal.  No bruits.  Lungs: clear to auscultation. No rales no wheezes.  Heart: Regular rate and rhythm. Normal S1, S2. No S3.   Gr II/Vi systolic murmur LSB. PMI not displaced.  Abdomen:  Supple, nontender. Normal bowel sounds. No masses. No hepatomegaly.  Extremities:   Good distal pulses throughout. No lower extremity edema.  Musculoskeletal :moving all extremities.  Neuro:   alert and oriented x3.  CN II-XII grossly intact.  EKG  Sr 62  First degree AV block 210  msec.  Septal infarct.  Sl ST depression, T wave inversion II, III, AF, V4 to V6.  Present in EKG from 5/14. Assessment and Plan:  1.  Abnormal EKG/echo  Echo report shows inferolateral hypokinesis with mildly depressed LVEF   I would recomm L heart cath to evaluate anatomy  Discussed risk/benefits  Patinet agrees to proceed.    2.  DM  On oral agents  3.  HCM  Need t oget lipids.

## 2013-03-15 NOTE — Interval H&P Note (Signed)
History and Physical Interval Note:  03/15/2013 3:01 PM  Alan [name]  has presented today for surgery, with the diagnosis of Chest pain  The various methods of treatment have been discussed with the patient and family. After consideration of risks, benefits and other options for treatment, the patient has consented to  Procedure(s): LEFT HEART CATHETERIZATION WITH CORONARY ANGIOGRAM (N/A) as a surgical intervention .  The patient's history has been reviewed, patient examined, no change in status, stable for surgery.  I have reviewed the patient's chart and labs.  Questions were answered to the patient's satisfaction.    Cath Lab Visit (complete for each Cath Lab visit)  Clinical Evaluation Leading to the Procedure:   ACS: no  Non-ACS:    Anginal Classification: No Symptoms  Anti-ischemic medical therapy: No Therapy  Non-Invasive Test Results: No non-invasive testing performed  Prior CABG: No previous CABG         [name]

## 2013-03-15 NOTE — CV Procedure (Signed)
    Cardiac Catheterization Procedure Note  Name: Halston Kintz MRN: [id number] DOB: 03/09/1941  Procedure: Left Heart Cath, Selective Coronary Angiography, LV angiography  Indication: abnormal echo with segmental LV dysfunction, shortness of breath.   Procedural Details: The right wrist was prepped, draped, and anesthetized with 1% lidocaine. Using the modified Seldinger technique, a 5 French sheath was introduced into the right radial artery. 3 mg of verapamil was administered through the sheath, weight-based unfractionated heparin was administered intravenously. Standard Judkins catheters were used for selective coronary angiography and left ventriculography. Catheter exchanges were performed over an exchange length guidewire. There were no immediate procedural complications. A TR band was used for radial hemostasis at the completion of the procedure.  The patient was transferred to the post catheterization recovery area for further monitoring.  Procedural Findings: Hemodynamics: AO 128/55 LV 127/18  Coronary angiography: Coronary dominance: right  Left mainstem: The left main is widely patent with minor irregularity.  Left anterior descending (LAD): The LAD is a normal caliber vessel. It supplies a large first diagonal branch with no significant stenosis. There are diffuse irregularities but no significant stenoses throughout the course of the LAD as it reaches and wraps around the left ventricular apex.  Left circumflex (LCx): The left circumflex is patent the obtuse marginal and posterior lateral branches are widely patent without obstructive disease.  Right coronary artery (RCA): The right coronary artery is widely patent. The origin of the vessel has an inferior takeoff. There is no significant obstructive disease throughout its course. It gives off a PDA and PLA branch.  Left ventriculography: There is severe segmental left ventricular systolic dysfunction. The  inferolateral wall is akinetic. The anterolateral wall and apex appear hypokinetic. The basal inferior wall is hypokinetic. The estimated left ventricular ejection fraction is 35%.  Final Conclusions:   1. Widely patent coronary arteries with minor luminal irregularities 2. Severe segmental left ventricular systolic dysfunction  Recommendations: Medical therapy for nonischemic cardiomyopathy.  [name] 03/15/2013, 3:34 PM

## 2013-03-16 ENCOUNTER — Ambulatory Visit: Payer: Medicare Other | Admitting: Internal Medicine

## 2013-03-21 ENCOUNTER — Telehealth: Payer: Self-pay | Admitting: *Deleted

## 2013-03-21 DIAGNOSIS — I429 Cardiomyopathy, unspecified: Secondary | ICD-10-CM

## 2013-03-21 NOTE — Telephone Encounter (Signed)
Spoke with pt, echo scheduled. 

## 2013-03-21 NOTE — Telephone Encounter (Signed)
Per dr Tenny Craw, would schedule echo to define LV systolc function better. Cath only images from 1 angle  ----- Message ----- From: [name], MD Sent: 03/15/2013 3:41 PM To: Pricilla Riffle, MD Gunnar Fusi - coronaries are fine, but LV function looks worse than that reported on the echo from the [facility] [place]. May want to re-image or put on meds and re-image in a few months. thx Kathlene November

## 2013-03-26 ENCOUNTER — Ambulatory Visit: Payer: Medicare Other | Admitting: Internal Medicine

## 2013-03-28 ENCOUNTER — Other Ambulatory Visit: Payer: Self-pay | Admitting: *Deleted

## 2013-03-29 ENCOUNTER — Other Ambulatory Visit: Payer: Self-pay | Admitting: *Deleted

## 2013-03-29 DIAGNOSIS — H409 Unspecified glaucoma: Secondary | ICD-10-CM

## 2013-04-06 ENCOUNTER — Ambulatory Visit (HOSPITAL_COMMUNITY): Payer: Medicare Other | Attending: Internal Medicine | Admitting: Cardiology

## 2013-04-06 DIAGNOSIS — R0609 Other forms of dyspnea: Secondary | ICD-10-CM | POA: Insufficient documentation

## 2013-04-06 DIAGNOSIS — R079 Chest pain, unspecified: Secondary | ICD-10-CM

## 2013-04-06 DIAGNOSIS — R9431 Abnormal electrocardiogram [ECG] [EKG]: Secondary | ICD-10-CM

## 2013-04-06 DIAGNOSIS — R0602 Shortness of breath: Secondary | ICD-10-CM

## 2013-04-06 DIAGNOSIS — I428 Other cardiomyopathies: Secondary | ICD-10-CM

## 2013-04-06 DIAGNOSIS — R0989 Other specified symptoms and signs involving the circulatory and respiratory systems: Secondary | ICD-10-CM | POA: Insufficient documentation

## 2013-04-06 DIAGNOSIS — I429 Cardiomyopathy, unspecified: Secondary | ICD-10-CM

## 2013-04-06 DIAGNOSIS — E119 Type 2 diabetes mellitus without complications: Secondary | ICD-10-CM | POA: Insufficient documentation

## 2013-04-06 NOTE — Progress Notes (Signed)
Echo performed. 

## 2013-04-09 ENCOUNTER — Telehealth (HOSPITAL_COMMUNITY): Payer: Self-pay | Admitting: *Deleted

## 2013-04-09 NOTE — Telephone Encounter (Signed)
Opened in error

## 2013-04-13 ENCOUNTER — Telehealth: Payer: Self-pay | Admitting: *Deleted

## 2013-04-13 NOTE — Telephone Encounter (Signed)
Per dr Tenny Craw, LV function appears better on echo than cath. Aorta does appear to be moderately enlarged. I will review with other cardiologist. Need to follow aorta. Will contact him.

## 2013-04-16 ENCOUNTER — Other Ambulatory Visit: Payer: Self-pay | Admitting: *Deleted

## 2013-04-16 ENCOUNTER — Encounter: Payer: Self-pay | Admitting: *Deleted

## 2013-04-16 DIAGNOSIS — I77810 Thoracic aortic ectasia: Secondary | ICD-10-CM

## 2013-04-16 DIAGNOSIS — I359 Nonrheumatic aortic valve disorder, unspecified: Secondary | ICD-10-CM

## 2013-04-17 ENCOUNTER — Ambulatory Visit (INDEPENDENT_AMBULATORY_CARE_PROVIDER_SITE_OTHER): Payer: Medicare Other | Admitting: Cardiovascular Disease

## 2013-04-17 ENCOUNTER — Encounter: Payer: Self-pay | Admitting: Cardiovascular Disease

## 2013-04-17 ENCOUNTER — Telehealth: Payer: Self-pay | Admitting: Cardiovascular Disease

## 2013-04-17 VITALS — BP 130/58 | HR 64 | Ht 70.0 in | Wt 228.0 lb

## 2013-04-17 DIAGNOSIS — I712 Thoracic aortic aneurysm, without rupture, unspecified: Secondary | ICD-10-CM

## 2013-04-17 NOTE — Telephone Encounter (Signed)
Dr Tenny Craw spoke with the pt, he will have a cardiac MRI for AVD and dilated aortic root. Orders placed

## 2013-04-17 NOTE — Patient Instructions (Addendum)
Your physician wants you to follow-up in: 6 months with Dr. [name]. You will receive a reminder letter in the mail two months in advance. If you don't receive a letter, please call our office to schedule the follow-up appointment.   Your physician has requested that you have a CTA of your chest.  We will call you to schedule this appointment.  Your physician recommends that you continue on your current medications as directed. Please refer to the Current Medication list given to you today.  You may exercise as indicated by Dr. [name].

## 2013-04-17 NOTE — Progress Notes (Signed)
HPI:  72 year old gentleman presenting for followup evaluation. He was initially seen by Dr. Tenny Craw in November because of an abnormal EKG and echocardiogram. He had complained of exertional dyspnea. He underwent cardiac catheterization demonstrating normal coronary arteries with moderately severe LV dysfunction by ventriculogram. A followup echocardiogram was done and actually showed normal LV systolic function with an ejection fraction of 55-60%. His ascending aorta was noted to be significantly dilated at 4.8 cm and he had moderate aortic insufficiency noted.  The patient feels fine. He denies chest pain, shortness of breath, edema, or palpitations. He's had no lightheadedness or syncope. He has no family history of aortic dissection or sudden death. He has no history of connective tissue disease.  Outpatient Encounter Prescriptions as of 04/17/2013  Medication Sig  . b complex vitamins tablet Take 1 tablet by mouth daily. 500mg . Takes 2 qd  . Cholecalciferol (VITAMIN D) 2000 UNITS CAPS Take 1 capsule by mouth daily.   . colesevelam (WELCHOL) 625 MG tablet Take 2,500 mg by mouth daily.  Marland Kitchen glipiZIDE (GLUCOTROL) 5 MG tablet Take 7.5 mg by mouth daily. Take 1/2 tablet in the morning and 1 tablet in the evening  . latanoprost (XALATAN) 0.005 % ophthalmic solution Place 1 drop into both eyes at bedtime.   . metFORMIN (GLUCOPHAGE) 500 MG tablet TAKE 250MG  TWICE A DAY  . timolol (TIMOPTIC) 0.5 % ophthalmic solution Place 1 drop into both eyes 2 (two) times daily.     Allergies  Allergen Reactions  . Avandia [Rosiglitazone] Other (See Comments)    Caused heart issues    Past Medical History  Diagnosis Date  . Malfunction of penile prosthesis   . SUI (stress urinary incontinence), male   . ED (erectile dysfunction)   . Diabetes mellitus ORAL MED  . Arthritis   . Nocturia   . Malfunction of artificial urethral sphincter   . Glaucoma of right eye   . History of prostate cancer     ROS:  Negative except as per HPI  BP 130/58  Pulse 64  Ht 5\' 10"  (1.778 m)  Wt 228 lb (103.42 kg)  BMI 32.71 kg/m2  SpO2 97%  PHYSICAL EXAM: Pt is alert and oriented, NAD HEENT: normal Neck: JVP - normal, carotids 2+= without bruits Lungs: CTA bilaterally CV: RRR without murmur or gallop Abd: soft, NT, Positive BS, no hepatomegaly Ext: no C/C/E, distal pulses intact and equal Skin: warm/dry no rash  Cardiac catheterization 03/15/2013: Procedural Findings:  Hemodynamics:  AO 128/55  LV 127/18  Coronary angiography:  Coronary dominance: right  Left mainstem: The left main is widely patent with minor irregularity.  Left anterior descending (LAD): The LAD is a normal caliber vessel. It supplies a large first diagonal branch with no significant stenosis. There are diffuse irregularities but no significant stenoses throughout the course of the LAD as it reaches and wraps around the left ventricular apex.  Left circumflex (LCx): The left circumflex is patent the obtuse marginal and posterior lateral branches are widely patent without obstructive disease.  Right coronary artery (RCA): The right coronary artery is widely patent. The origin of the vessel has an inferior takeoff. There is no significant obstructive disease throughout its course. It gives off a PDA and PLA branch.  Left ventriculography: There is severe segmental left ventricular systolic dysfunction. The inferolateral wall is akinetic. The anterolateral wall and apex appear hypokinetic. The basal inferior wall is hypokinetic. The estimated left ventricular ejection fraction is 35%.  Final Conclusions:  1. Widely patent coronary arteries with minor luminal irregularities  2. Severe segmental left ventricular systolic dysfunction  Recommendations: Medical therapy for nonischemic cardiomyopathy.  [name]  03/15/2013, 3:34 PM  2-D echocardiogram 04/06/2013: Left ventricle: The cavity size was severely dilated.  Wall thickness was increased in a pattern of mild LVH. Systolic function was normal. The estimated ejection fraction was in the range of 50% to 55%. Doppler parameters are consistent with abnormal left ventricular relaxation (grade 1 diastolic dysfunction).  ------------------------------------------------------------ Aortic valve: Aortic insufficiency is eccentric but appears to be moderate. Mildly thickened leaflets.  ------------------------------------------------------------ Aorta: Ascending aorta is dilated at 48 mm.  ------------------------------------------------------------ Mitral valve: Mildly thickened leaflets . Doppler: Mild regurgitation.  ------------------------------------------------------------ Left atrium: The atrium was severely dilated.  ------------------------------------------------------------ Right ventricle: The cavity size was normal. Wall thickness was normal. Systolic function was normal.  ------------------------------------------------------------ Pulmonic valve: Structurally normal valve. Cusp separation was normal. Doppler: Transvalvular velocity was within the normal range. Mild regurgitation.  ------------------------------------------------------------ Tricuspid valve: Structurally normal valve. Leaflet separation was normal. Doppler: Transvalvular velocity was within the normal range. Mild regurgitation.  ------------------------------------------------------------ Right atrium: The atrium was normal in size.  ------------------------------------------------------------ Pericardium: A trivial pericardial effusion was identified.  ------------------------------------------------------------ Post procedure conclusions Ascending Aorta:  - Ascending aorta is dilated at 48 mm.  ASSESSMENT AND PLAN: 1. Ascending thoracic aortic aneurysm. The ascending aorta is dilated at 4.8 cm by echo. This is associated with mild to moderate aortic  insufficiency. I'm not able to appreciate aortic insufficiency on his exam. I have recommended an MRA of the chest to evaluate his aortic pathology. He will need to be done in the open scanner because of claustrophobia. We discussed the natural history of a dilated/aneurysmal aorta. He understands this will require long-term surveillance.  2. Cardiomyopathy. There is discrepant information from cath and echo. It appears he does not have significant LV dysfunction. I'm going to request his echo images from the [place] as there was abnormal systolic function identified on that study as well. It is reassuring that his recent echo here showed normal LV systolic function. I do not think any medical therapy is indicated at this time. His left ventricular ejection fraction was 55% and there is no evidence of hypertension on his exam.  [name] 04/17/2013 9:57 AM

## 2013-04-17 NOTE — Telephone Encounter (Signed)
ROI faxed to  1.[facility] in [place] at [phone] 2.[facility] in [place] at [phone]

## 2013-04-23 ENCOUNTER — Inpatient Hospital Stay: Admission: RE | Admit: 2013-04-23 | Payer: Medicare Other | Source: Ambulatory Visit

## 2013-04-24 ENCOUNTER — Ambulatory Visit (INDEPENDENT_AMBULATORY_CARE_PROVIDER_SITE_OTHER)
Admission: RE | Admit: 2013-04-24 | Discharge: 2013-04-24 | Disposition: A | Discharge status: Home / Self Care | Payer: Medicare Other | Source: Ambulatory Visit | Attending: Cardiovascular Disease | Admitting: Cardiovascular Disease

## 2013-04-24 DIAGNOSIS — I712 Thoracic aortic aneurysm, without rupture: Secondary | ICD-10-CM

## 2013-04-24 MED ORDER — IOHEXOL 350 MG/ML SOLN: 100.0000 mL | Freq: Once | INTRAVENOUS | Status: AC | PRN

## 2013-04-27 ENCOUNTER — Telehealth: Payer: Self-pay | Admitting: Cardiovascular Disease

## 2013-04-27 NOTE — Telephone Encounter (Signed)
New Problem:  Pt is requesting a call back to hear his CT test results.

## 2013-04-27 NOTE — Telephone Encounter (Signed)
I spoke with the pt and gave him preliminary results of CTA.  The pt was more concerned about pulmonary nodules found on test.  The pt does have a history of smoking when he was in the [employer]. I made the pt aware that Dr [name] will review this test and make any further recommendations for testing. We will call the pt again with final results.

## 2013-04-30 NOTE — Telephone Encounter (Signed)
Reviewed CT findings with patient. Will repeat CT chest in 6 months to follow pulmonary nodules and repeat echo 12 months to follow dilated aortic root. thx

## 2013-05-01 ENCOUNTER — Telehealth: Payer: Self-pay | Admitting: Cardiovascular Disease

## 2013-05-01 NOTE — Telephone Encounter (Signed)
Follow Up ° °Pt returned call//  °

## 2013-05-01 NOTE — Telephone Encounter (Signed)
The Called to pt  was an error per Eligha Bridegroom RN.

## 2013-05-15 ENCOUNTER — Ambulatory Visit (HOSPITAL_COMMUNITY): Payer: Medicare Other

## 2013-09-14 ENCOUNTER — Telehealth: Payer: Self-pay | Admitting: Cardiovascular Disease

## 2013-09-14 NOTE — Telephone Encounter (Signed)
ROI faxed to  [facility] in [place] @ [phone] [facility] [place] @ [phone]   5.22.15/kdm

## 2013-10-11 ENCOUNTER — Ambulatory Visit (INDEPENDENT_AMBULATORY_CARE_PROVIDER_SITE_OTHER): Payer: Medicare Other | Admitting: Cardiovascular Disease

## 2013-10-11 ENCOUNTER — Encounter: Payer: Self-pay | Admitting: Cardiovascular Disease

## 2013-10-11 VITALS — BP 128/54 | HR 63 | Ht 70.0 in | Wt 230.8 lb

## 2013-10-11 DIAGNOSIS — R918 Other nonspecific abnormal finding of lung field: Secondary | ICD-10-CM

## 2013-10-11 DIAGNOSIS — I712 Thoracic aortic aneurysm, without rupture, unspecified: Secondary | ICD-10-CM

## 2013-10-11 MED ORDER — ASPIRIN EC 81 MG PO TBEC: 81.0000 mg | DELAYED_RELEASE_TABLET | Freq: Every day | ORAL | Status: AC

## 2013-10-11 NOTE — Progress Notes (Signed)
HPI:   73 year old gentleman presenting for followup evaluation. He was initially seen by Dr. Tenny Crawoss in November because of an abnormal EKG and echocardiogram. He had complained of exertional dyspnea. He underwent cardiac catheterization demonstrating normal coronary arteries with moderately severe LV dysfunction by ventriculogram. A followup echocardiogram was done and actually showed normal LV systolic function with an ejection fraction of 55-60%. His ascending aorta was noted to be significantly dilated at 4.8 cm and he had moderate aortic insufficiency noted.    the patient is doing well from a cardiac perspective. He's had some dizziness related to medications. These have been adjusted and he is feeling better. He denies chest pain, chest pressure, shortness of breath, edema, or heart palpitations.   Outpatient Encounter Prescriptions as of 10/11/2013  Medication Sig  . b complex vitamins tablet Take 1 tablet by mouth daily. 500mg . Takes 2 qd  . Cholecalciferol (VITAMIN D) 2000 UNITS CAPS Take 1 capsule by mouth daily.   Marland Kitchen. glipiZIDE (GLUCOTROL) 5 MG tablet Take 7.5 mg by mouth daily. Take 1/2 tablet in the morning and 1 tablet in the evening  . latanoprost (XALATAN) 0.005 % ophthalmic solution Place 1 drop into both eyes at bedtime.   . timolol (TIMOPTIC) 0.5 % ophthalmic solution Place 1 drop into both eyes 2 (two) times daily.   . [DISCONTINUED] colesevelam (WELCHOL) 625 MG tablet Take 2,500 mg by mouth daily.  . [DISCONTINUED] metFORMIN (GLUCOPHAGE) 500 MG tablet TAKE 250MG  TWICE A DAY    Allergies  Allergen Reactions  . Metformin And Related     Vertigo  . Avandia [Rosiglitazone] Other (See Comments)    Caused heart issues    Past Medical History  Diagnosis Date  . Malfunction of penile prosthesis   . SUI (stress urinary incontinence), male   . ED (erectile dysfunction)   . Diabetes mellitus ORAL MED  . Arthritis   . Nocturia   . Malfunction of artificial urethral  sphincter   . Glaucoma of right eye   . History of prostate cancer     ROS: Negative except as per HPI  BP 128/54  Pulse 63  Ht 5\' 10"  (1.778 m)  Wt 104.69 kg (230 lb 12.8 oz)  BMI 33.12 kg/m2  PHYSICAL EXAM: Pt is alert and oriented, NAD HEENT: normal Neck: JVP - normal, carotids 2+= without bruits Lungs: CTA bilaterally CV: RRR without murmur or gallop Abd: soft, NT, Positive BS, no hepatomegaly Ext: no C/C/E, distal pulses intact and equal Skin: warm/dry no rash  EKG:  Normal sinus rhythm with right bundle branch block, left axis deviation. Heart rate 63 beats per minute.  CTA chest 04/17/2013: FINDINGS:  Mild aneurysmal dilatation of the ascending thoracic aorta. This  measures 4.5 cm maximally in the mid ascending thoracic aorta.  Proximal aortic arch measures approximately 4.1 cm. Proximal  descending thoracic aorta measures approximately 3.1 cm. Descending  thoracic aorta at the hiatus measures 2.7 cm. No evidence of  dissection.  Heart is normal size. Scattered coronary artery calcifications in  the left anterior descending and right coronary arteries. No filling  defects in the pulmonary arteries to suggest pulmonary emboli.  No pleural effusions. Small pleural-based nodule in the left upper  lobe peripherally on image 30 measures 5 mm. Pleural-based nodule in  the right lower lobe along the major fissure on image 44 measures 8  mm. No confluent airspace opacities otherwise.  No mediastinal, hilar, or axillary adenopathy. Chest wall soft  tissues are unremarkable.  Low-density nodule in the right thyroid lobe measures approximately  17 mm.  Imaging into the upper abdomen demonstrates maximum diameter of the  proximal aorta at 2.7 cm. Gallstones noted within the gallbladder.  No acute findings in the upper abdomen.  No acute bony abnormality.  Review of the MIP images confirms the above findings.  IMPRESSION:  Mild aneurysmal dilatation of the ascending  thoracic aorta (4.5 cm)  and proximal aortic arch (4.1 cm). No evidence of dissection.  Small bilateral pulmonary nodules, the largest 8 mm in the right  lower lobe along the major fissure. If the patient is at high risk  for bronchogenic carcinoma, follow-up chest CT at 3-38months is  recommended. If the patient is at low risk for bronchogenic  carcinoma, follow-up chest CT at 6-12 months is recommended. This  recommendation follows the consensus statement: Guidelines for  Management of Small Pulmonary Nodules Detected on CT Scans: A  Statement from the Fleischner Society as published in Radiology  2005; 237:395-400.   ASSESSMENT AND PLAN: 1. Ascending aortic aneurysm: Most recent CT scan was reviewed. He has mild dilatation of the ascending thoracic aorta with maximal dimension 4.5 cm. Recommend a repeat study at one year interval. Blood pressure is well controlled. Start aspirin 81 mg daily.  2. Pulmonary nodules. The patient has minimal tobacco history greater than 30 years ago. He is not at high risk for bronchogenic carcinoma. As per recommendations will repeat a study at 12 months.  [name] 10/11/2013 4:07 PM

## 2013-10-11 NOTE — Patient Instructions (Addendum)
Non-Cardiac CT scanning, (CAT scanning), is a noninvasive, special x-ray that produces cross-sectional images of the body using x-rays and a computer. CT scans help physicians diagnose and treat medical conditions. For some CT exams, a contrast material is used to enhance visibility in the area of the body being studied. CT scans provide greater clarity and reveal more details than regular x-ray exams. (CT Chest without contrast in December)  Your physician wants you to follow-up in: 1 YEAR with Dr [name].  You will receive a reminder letter in the mail two months in advance. If you don't receive a letter, please call our office to schedule the follow-up appointment.  Your physician has recommended you make the following change in your medication: START Aspirin 81mg  take one by mouth daily

## 2014-04-04 ENCOUNTER — Encounter (HOSPITAL_COMMUNITY): Payer: Self-pay | Admitting: Cardiovascular Disease

## 2014-04-05 ENCOUNTER — Other Ambulatory Visit: Payer: Self-pay

## 2014-04-05 DIAGNOSIS — I712 Thoracic aortic aneurysm, without rupture: Secondary | ICD-10-CM

## 2014-04-05 DIAGNOSIS — I7121 Aneurysm of the ascending aorta, without rupture: Secondary | ICD-10-CM

## 2014-04-08 ENCOUNTER — Ambulatory Visit (INDEPENDENT_AMBULATORY_CARE_PROVIDER_SITE_OTHER)
Admission: RE | Admit: 2014-04-08 | Discharge: 2014-04-08 | Disposition: A | Discharge status: Home / Self Care | Payer: Medicare Other | Source: Ambulatory Visit | Attending: Cardiovascular Disease | Admitting: Cardiovascular Disease

## 2014-04-08 ENCOUNTER — Other Ambulatory Visit (INDEPENDENT_AMBULATORY_CARE_PROVIDER_SITE_OTHER): Payer: Medicare Other

## 2014-04-08 DIAGNOSIS — I712 Thoracic aortic aneurysm, without rupture, unspecified: Secondary | ICD-10-CM

## 2014-04-08 DIAGNOSIS — R918 Other nonspecific abnormal finding of lung field: Secondary | ICD-10-CM

## 2014-04-08 DIAGNOSIS — I7121 Aneurysm of the ascending aorta, without rupture: Secondary | ICD-10-CM

## 2014-04-08 LAB — BASIC METABOLIC PANEL
BUN: 19 mg/dL (ref 6–23)
CO2: 23 mEq/L (ref 19–32)
CREATININE: 1.1 mg/dL (ref 0.4–1.5)
Calcium: 9 mg/dL (ref 8.4–10.5)
Chloride: 106 mEq/L (ref 96–112)
GFR: 72.18 mL/min (ref >60.00)
GLUCOSE: 200 mg/dL — AB (ref 70–99)
Potassium: 4.2 mEq/L (ref 3.5–5.1)
Sodium: 136 mEq/L (ref 135–145)

## 2014-04-08 MED ORDER — IOHEXOL 350 MG/ML SOLN
100.0000 mL | Freq: Once | INTRAVENOUS | Status: AC | PRN
  Administered 2014-04-08: 100 mL via INTRAVENOUS

## 2014-04-11 ENCOUNTER — Other Ambulatory Visit: Payer: Medicare Other

## 2014-04-11 ENCOUNTER — Telehealth: Payer: Self-pay | Admitting: Cardiovascular Disease

## 2014-04-11 NOTE — Telephone Encounter (Signed)
Follow Up    Pt calling back with additional questions. Please call.

## 2014-04-11 NOTE — Telephone Encounter (Signed)
Follow Up    Pt calling to follow up of CT test results. Please call.

## 2014-04-11 NOTE — Telephone Encounter (Signed)
Returned call to patient he stated he was calling for results of Chest Ct done 04/08/14.Advised Dr.Cooper out of office.Results not available.Will send message to Dr.Cooper.

## 2014-04-11 NOTE — Telephone Encounter (Signed)
Returned call to patient he stated he was driving and does not remember what I said about chest ct.Advised chest ct has needs to reviewed by Dr.Cooper.Advised thoracic aneurysm has increased in size.Advised stable bilateral pulmonary nodules.Advised we will call back after Dr.Cooper reviews.

## 2014-04-12 NOTE — Telephone Encounter (Signed)
Returned call to patient he stated he spoke to Dr.Cooper this morning and he has changed his mind.Stated he will do MRA if Dr.Cooper will give him medication to relax him.Message sent to Dr.Cooper and his nurse Julieta Gutting RN.

## 2014-04-12 NOTE — Telephone Encounter (Signed)
PT RTN CALL TO CHERYL, PSL CALL

## 2014-04-12 NOTE — Telephone Encounter (Signed)
Reviewed findings with patient. Thoracic aorta has enlarged from 4.5 to 4.9 cm. Recommend repeat scan in 6 months and if continued enlargement will refer to cardiac surgery. Would like to do MRA but he cannot tolerate even with premedication. Will check with radiology on possibility of open MRA. If not possible, will repeat a CTA.   [name] 04/12/2014 8:53 AM

## 2014-04-16 NOTE — Telephone Encounter (Signed)
Follow up      Pt is still waiting on the nurse to call and schedule an MRI.  He will also need to be medicated.

## 2014-04-16 NOTE — Telephone Encounter (Signed)
I spoke with the pt and made him aware that is looks like Dr [name] recommended MRA in 6 months.  I will clarify if test is needed now or in 6 months.

## 2014-04-17 NOTE — Telephone Encounter (Signed)
Not needed until 6 months from now. Will try MRA with premeds. thx

## 2014-05-01 ENCOUNTER — Other Ambulatory Visit: Payer: Self-pay | Admitting: Dermatology

## 2014-05-07 NOTE — Telephone Encounter (Signed)
Pt aware.

## 2014-06-20 ENCOUNTER — Other Ambulatory Visit: Payer: Self-pay | Admitting: Dermatology

## 2014-08-05 ENCOUNTER — Encounter: Payer: Self-pay | Admitting: Cardiovascular Disease

## 2014-09-17 ENCOUNTER — Other Ambulatory Visit: Payer: Self-pay

## 2014-09-30 ENCOUNTER — Telehealth: Payer: Self-pay

## 2014-09-30 DIAGNOSIS — I712 Thoracic aortic aneurysm, without rupture, unspecified: Secondary | ICD-10-CM

## 2014-09-30 NOTE — Telephone Encounter (Signed)
I spoke with Alan [name] in MRI at [facility] and due to the pt's stent implanted for assistance with urination they cannot perform testing in there MRI.  The pt would have to go to [facility] for MRI but the machine is not an open scanner. The pt does have a stent card that states okay for MRI but due to the different strengths of magnet the MRI manufacturer prohibits doing test with this type of stent. I will make Dr [name] aware of this information and he can determine what alternative type of testing the pt should have at this time.   I spoke with the pt and made him aware of this information.  The pt said he would try to do MRI in a closed scanner if that is what needs to be done.  The pt is going on vacation 6/8-6/14.  I made the pt aware that I would contact him next week after his vacation with further recommendations.

## 2014-09-30 NOTE — Telephone Encounter (Signed)
I spoke with [facility] and they would like the pt to contact them at [phone] to schedule MRA.  I spoke with the pt and he will come into our office on 10/07/14 for BMP and will pick up Rx for anxiety medication to take prior to test.  The pt is aware he needs to contact [facility] and schedule his test after 10/09/14.  The pt has an upcoming appointment with Dr [name] on 10/21/14.   I will forward this message to Dr [name] to give order for anxiety medication.

## 2014-09-30 NOTE — Telephone Encounter (Signed)
Follow Up  Pt returned call. He also states that he called [place] and reports that they cannot complete the MRI. And to call [place]. Please call back to discuss

## 2014-10-03 NOTE — Telephone Encounter (Signed)
Reviewed findings of last study and notes. Recommend gated CTA chest - Dr Llana Aliment to read. thx

## 2014-10-07 ENCOUNTER — Other Ambulatory Visit: Payer: TRICARE For Life (TFL)

## 2014-10-10 NOTE — Telephone Encounter (Signed)
Follow Up       Pt calling back to speak to Lauren in regards to scheduling everything that was previously discussed. Pt only wants to talk to Lauren when she is back in the office. Please call back and advise.

## 2014-10-10 NOTE — Telephone Encounter (Signed)
Called pt back to inform him that Leotis Shames would not be back in the office until Monday. Pt states that Leotis Shames is aware that he couldn't get the MRI done d/t stent and that she had told him to call when he got back into town and she would get this straightened out. Pt states that he has an appt Monday morning but should be home after 10AM. Informed pt that I would route this information to Lauren for review, advisement and follow up.  Pt verbalized understanding and was in agreement with this plan.

## 2014-10-10 NOTE — Telephone Encounter (Signed)
Called patient to inform someone will be calling him to schedule the CTA and blood work. Pt is appreciative for the call.

## 2014-10-10 NOTE — Telephone Encounter (Signed)
Placed order for CT angio chest w/ wo contrast at [facility], Dr. Llana Aliment to read. Dx dilated aortic root.

## 2014-10-14 ENCOUNTER — Other Ambulatory Visit (INDEPENDENT_AMBULATORY_CARE_PROVIDER_SITE_OTHER): Payer: Medicare Other | Admitting: *Deleted

## 2014-10-14 ENCOUNTER — Encounter: Payer: Self-pay | Admitting: Cardiovascular Disease

## 2014-10-14 DIAGNOSIS — I712 Thoracic aortic aneurysm, without rupture, unspecified: Secondary | ICD-10-CM

## 2014-10-14 LAB — BASIC METABOLIC PANEL
BUN: 20 mg/dL (ref 6–23)
CO2: 27 meq/L (ref 19–32)
CREATININE: 1.13 mg/dL (ref 0.40–1.50)
Calcium: 9.6 mg/dL (ref 8.4–10.5)
Chloride: 105 mEq/L (ref 96–112)
GFR: 67.68 mL/min (ref >60.00)
Glucose, Bld: 138 mg/dL — ABNORMAL HIGH (ref 70–99)
Potassium: 4.6 mEq/L (ref 3.5–5.1)
SODIUM: 137 meq/L (ref 135–145)

## 2014-10-14 NOTE — Telephone Encounter (Signed)
New Message    Pt is calling in to see what was the out come form him getting his CT scan scheduled

## 2014-10-14 NOTE — Telephone Encounter (Signed)
I contacted Radiology and have scheduled the pt for a GATED CT angio of Chest with and without contrast with Dr Llana Aliment to read on 10/18/14 at 10:30.  The pt needs to arrive at [facility] at 10:15 and NPO 4 hours prior to test.  The pt will need to have a BMP drawn in the office today.  I asked the pt about his diabetic medications and he has restarted metformin.  I made him aware that he cannot take this medication the night before, morning of or 48 hours after test. I have spoken with Charmaine in pre-cert and made her aware of testing. Pt aware of test.

## 2014-10-14 NOTE — Telephone Encounter (Signed)
This encounter was created in error - please disregard.

## 2014-10-14 NOTE — Addendum Note (Signed)
Addended by: Galen Daft A on: 10/14/2014 01:59 PM   Modules accepted: Orders

## 2014-10-18 ENCOUNTER — Telehealth: Payer: Self-pay | Admitting: *Deleted

## 2014-10-18 ENCOUNTER — Ambulatory Visit (HOSPITAL_COMMUNITY)
Admission: RE | Admit: 2014-10-18 | Discharge: 2014-10-18 | Disposition: A | Discharge status: Home / Self Care | Payer: Medicare Other | Source: Ambulatory Visit | Attending: Cardiovascular Disease | Admitting: Cardiovascular Disease

## 2014-10-18 DIAGNOSIS — I712 Thoracic aortic aneurysm, without rupture, unspecified: Secondary | ICD-10-CM

## 2014-10-18 DIAGNOSIS — K760 Fatty (change of) liver, not elsewhere classified: Secondary | ICD-10-CM | POA: Insufficient documentation

## 2014-10-18 DIAGNOSIS — K8020 Calculus of gallbladder without cholecystitis without obstruction: Secondary | ICD-10-CM | POA: Insufficient documentation

## 2014-10-18 DIAGNOSIS — I517 Cardiomegaly: Secondary | ICD-10-CM | POA: Insufficient documentation

## 2014-10-18 DIAGNOSIS — I700 Atherosclerosis of aorta: Secondary | ICD-10-CM | POA: Insufficient documentation

## 2014-10-18 DIAGNOSIS — I2510 Atherosclerotic heart disease of native coronary artery without angina pectoris: Secondary | ICD-10-CM | POA: Insufficient documentation

## 2014-10-18 LAB — GLUCOSE, CAPILLARY: GLUCOSE-CAPILLARY: 155 mg/dL — AB (ref 65–99)

## 2014-10-18 MED ORDER — IOHEXOL 350 MG/ML SOLN
100.0000 mL | Freq: Once | INTRAVENOUS | Status: AC | PRN
  Administered 2014-10-18: 100 mL via INTRAVENOUS

## 2014-10-18 NOTE — Progress Notes (Signed)
Per Dr. Llana Aliment scan nongated on Siemens scanner assign to Dr. Llana Aliment. Dr. Llana Aliment reviewed patients chart.

## 2014-10-18 NOTE — Telephone Encounter (Signed)
UNABLE TO REACH PT TO GET FAMILY HX AND STATUS.  

## 2014-10-21 ENCOUNTER — Ambulatory Visit (INDEPENDENT_AMBULATORY_CARE_PROVIDER_SITE_OTHER): Payer: Medicare Other | Admitting: Cardiovascular Disease

## 2014-10-21 VITALS — Ht 70.0 in | Wt 231.8 lb

## 2014-10-21 DIAGNOSIS — I77810 Thoracic aortic ectasia: Secondary | ICD-10-CM | POA: Diagnosis not present

## 2014-10-21 DIAGNOSIS — I351 Nonrheumatic aortic (valve) insufficiency: Secondary | ICD-10-CM

## 2014-10-21 MED ORDER — METOPROLOL SUCCINATE ER 25 MG PO TB24: 25.0000 mg | ORAL_TABLET | Freq: Every day | ORAL | Status: DC

## 2014-10-21 NOTE — Progress Notes (Signed)
Cardiology Office Note   Date:  10/21/2014   ID:  Nazareth, Kirk December 18, 1940, MRN [id number]  PCP:  [name], MD  Cardiologist:  [name], MD    No chief complaint on file.   History of Present Illness: Alan [name] is a 74 y.o. male who presents for follow-up evaluation. He underwent cardiac catheterization demonstrating normal coronary arteries with moderately severe LV dysfunction by ventriculogram. A followup echocardiogram was done and actually showed normal LV systolic function with an ejection fraction of 55-60%. His ascending aorta was noted to be significantly dilated at 4.8 cm and he had moderate aortic insufficiency noted.    the patient is doing well today. He denies symptoms of chest pain, shortness of breath, leg swelling, or heart palpitations. He's had no lightheadedness or syncope. He recently underwent a chest CTA study to follow-up on his thoracic aortic aneurysm.   Past Medical History  Diagnosis Date  . Malfunction of penile prosthesis   . SUI (stress urinary incontinence), male   . ED (erectile dysfunction)   . Diabetes mellitus ORAL MED  . Arthritis   . Nocturia   . Malfunction of artificial urethral sphincter   . Glaucoma of right eye   . History of prostate cancer     Past Surgical History  Procedure Laterality Date  . I & d/ orif right index finger  01-19-2005  . Removal and replacement gu sphinter cuff  05-15-2004    STRESS URINARY INCONTINENCE  . Placement artificial urinary sphinter  01-20-2001    [id number]  . Insertion inflatable penile prosthesis  06-22-1999    MENTOR ALPHA I  . Penile prosthesis implant  09/24/2011    Procedure: PENILE PROTHESIS INFLATABLE;  Surgeon: [name], MD;  Location: [facility];  Service: Urology;  Laterality: N/A;  REPLACEMENT OF INFLATABLE PENILE PROTHESIS (COLOPLAST)    . Left heart catheterization with coronary angiogram N/A 03/15/2013    Procedure:  LEFT HEART CATHETERIZATION WITH CORONARY ANGIOGRAM;  Surgeon: [name], MD;  Location: [facility];  Service: Cardiovascular;  Laterality: N/A;    Current Outpatient Prescriptions  Medication Sig Dispense Refill  . aspirin 81 MG tablet Take 81 mg by mouth daily.    Marland Kitchen b complex vitamins tablet Take 2 tablets by mouth daily. 500mg .    . Cholecalciferol (VITAMIN D) 2000 UNITS CAPS Take 1 capsule by mouth daily.     Marland Kitchen docusate sodium (COLACE) 100 MG capsule Take 100 mg by mouth as needed for mild constipation.    Marland Kitchen ezetimibe (ZETIA) 10 MG tablet Take 10 mg by mouth daily.    Marland Kitchen glipiZIDE (GLUCOTROL) 5 MG tablet Take 7.5 mg by mouth daily. Take 1/2 tablet in the morning and 1 tablet in the evening    . latanoprost (XALATAN) 0.005 % ophthalmic solution Place 1 drop into both eyes at bedtime.     . meclizine (ANTIVERT) 25 MG tablet Take 25 mg by mouth 2 (two) times daily.    . metFORMIN (GLUCOPHAGE) 500 MG tablet Take 500 mg by mouth 2 (two) times daily with a meal.     No current facility-administered medications for this visit.    Allergies:   Avandia and Metformin and related   Social History:  The patient  reports that he has never smoked. He has never used smokeless tobacco. He reports that he does not drink alcohol or use illicit drugs.   Family History:  The patient's  family history  is not on file.    ROS:  Please see the history of present illness.  All other systems are reviewed and negative.    PHYSICAL EXAM: VS:  Ht  (1.778 m)  Wt 231 lb 12.8 oz (105.144 kg)  BMI 33.26 kg/m2 , BMI Body mass index is 33.26 kg/(m^2). GEN: Well nourished, well developed, in no acute distress HEENT: normal Neck: no JVD, no masses. No carotid bruits Cardiac: RRR without murmur or gallop                Respiratory:  clear to auscultation bilaterally, normal work of breathing GI: soft, nontender, nondistended, + BS MS: no deformity or atrophy Ext: no pretibial edema, pedal pulses 2+=  bilaterally Skin: warm and dry, no rash Neuro:  Strength and sensation are intact Psych: euthymic mood, full affect  EKG:  EKG is ordered today. The ekg ordered today shows  Normal sinus rhythm with first degree AV block and occasional PVCs , right bundle branch block unchanged from previous tracing.  Recent Labs: 10/14/2014: BUN 20; Creatinine, Ser 1.13; Potassium 4.6; Sodium 137   Lipid Panel  No results found for: CHOL, TRIG, HDL, CHOLHDL, VLDL, LDLCALC, LDLDIRECT    Wt Readings from Last 3 Encounters:  10/21/14 231 lb 12.8 oz (105.144 kg)  10/11/13 230 lb 12.8 oz (104.69 kg)  04/17/13 228 lb (103.42 kg)     Cardiac Studies Reviewed:  2-D echocardiogram 04/06/2013: Study Conclusions  - Left ventricle: The cavity size was severely dilated. Wall thickness was increased in a pattern of mild LVH. Systolic function was normal. The estimated ejection fraction was in the range of 50% to 55%. Doppler parameters are consistent with abnormal left ventricular relaxation (grade 1 diastolic dysfunction). - Aortic valve: Aortic insufficiency is eccentric but appears to be moderate. - Aorta: Ascending aorta is dilated at 48 mm. Aortic root dimension: 43mm (ED). - Left atrium: The atrium was severely dilated.  Chest CTA: IMPRESSION: 1. Minimal increase in size of ascending thoracic aortic aneurysm, which currently measures 5.0 cm in diameter. There is also mild dilatation of the aortic root, which measures up to 4.1 cm at the level of the sinuses of Valsalva. No evidence of thoracic aortic dissection at this time. Recommend semi-annual imaging followup by CTA or MRA and referral to cardiothoracic surgery if not already obtained. This recommendation follows 2010 ACCF/AHA/AATS/ACR/ASA/SCA/SCAI/SIR/STS/SVM Guidelines for the Diagnosis and Management of Patients With Thoracic Aortic Disease. Circulation. 2010; 121: O962-X528. 2. In addition, there are atherosclerotic  calcifications in the left anterior descending, left circumflex and right coronary arteries. 3. Mild hepatic steatosis. 4. Cholelithiasis.  Cardiac catheterization 03/15/2013: Final Conclusions:  1. Widely patent coronary arteries with minor luminal irregularities 2. Severe segmental left ventricular systolic dysfunction  Recommendations: Medical therapy for nonischemic cardiomyopathy.  ASSESSMENT AND PLAN: 1.   Thoracic aortic aneurysm: chest CTA reviewed and he has stable dilatation of the ascending aorta , maximal diameter 5.0 cm. We discussed the importance of blood pressure control. A beta blocker will be added to his medical regimen (Toprol XL 25 mg at bedtime). His aortic dimensions currently do not warrant surgical intervention. The patient has no history of connective tissue disease. Will continue longitudinal follow-up with a repeat scan in one year. If there is evidence of further enlargement, will refer to cardiac surgery.   2. Aortic valve insufficiency: I do not appreciate a diastolic murmur on exam. The patient's mechanism for aortic insufficiency is likely annular dilatation related to his aortic  disease. Will repeat an echocardiogram.  3. Hyperlipidemia: he is treated by his primary care physician.   4. Type 2 diabetes: Also treated by PCP with oral hypoglycemic agents.   Current medicines are reviewed with the patient today.  The patient does not have concerns regarding medicines.  Labs/ tests ordered today include:  No orders of the defined types were placed in this encounter.    Disposition:   FU one year  Signed, [name], MD  10/21/2014 3:59 PM    [facility] [address] [place], [place], [place]  [address] Phone: [phone]; Fax: [phone]

## 2014-10-21 NOTE — Patient Instructions (Signed)
Medication Instructions:  Your physician has recommended you make the following change in your medication:  1. START Metoprolol Succinate 25mg  take one by mouth daily  Labwork: No new orders.   Testing/Procedures: Your physician has requested that you have an echocardiogram (NOW). Echocardiography is a painless test that uses sound waves to create images of your heart. It provides your doctor with information about the size and shape of your heart and how well your heart's chambers and valves are working. This procedure takes approximately one hour. There are no restrictions for this procedure.  Non-Cardiac CT Angiography (CTA), is a special type of CT scan that uses a computer to produce multi-dimensional views of major blood vessels throughout the body. In CT angiography, a contrast material is injected through an IV to help visualize the blood vessels (GATED CT Angiogram of Chest with and without contrast in 1 YEAR)  Follow-Up: Your physician wants you to follow-up in: 1 YEAR with Dr [name].  You will receive a reminder letter in the mail two months in advance. If you don't receive a letter, please call our office to schedule the follow-up appointment.   Any Other Special Instructions Will Be Listed Below (If Applicable).

## 2014-10-23 ENCOUNTER — Encounter: Payer: Self-pay | Admitting: Cardiovascular Disease

## 2014-10-24 ENCOUNTER — Other Ambulatory Visit (HOSPITAL_COMMUNITY): Payer: TRICARE For Life (TFL)

## 2014-10-30 ENCOUNTER — Other Ambulatory Visit: Payer: Self-pay

## 2014-10-30 ENCOUNTER — Ambulatory Visit (HOSPITAL_COMMUNITY): Payer: Medicare Other | Attending: Cardiovascular Disease

## 2014-10-30 DIAGNOSIS — I351 Nonrheumatic aortic (valve) insufficiency: Secondary | ICD-10-CM | POA: Insufficient documentation

## 2014-10-30 DIAGNOSIS — I517 Cardiomegaly: Secondary | ICD-10-CM | POA: Diagnosis not present

## 2014-10-30 DIAGNOSIS — I77810 Thoracic aortic ectasia: Secondary | ICD-10-CM

## 2014-10-30 DIAGNOSIS — I340 Nonrheumatic mitral (valve) insufficiency: Secondary | ICD-10-CM | POA: Diagnosis not present

## 2014-12-22 NOTE — Progress Notes (Signed)
Cardiology Office Note   Date:  12/23/2014   ID:  Alan [name], Alan [name] 73-Aug-1942, MRN [id number]  PCP:  [name], MD  Cardiologist:  Dr. [name]   Electrophysiologist:  n/a  Chief Complaint  Patient presents with  . Cardiomyopathy     History of Present Illness: Alan [name] is a 74 y.o. male with a hx of NICM, ascending thoracic aortic aneurysm, mod AI, diabetes, prostate CA.  EF has improved in the past.  Last seen by Dr. [name] 6/16.  FU CTA demonstrated stable dilation of ascending thoracic aorta at 5 cm.  He will need repeat CTA in 1 year.  His medical regimen was intensified with the addition of a beta-blocker (Toprol-XL 25 QHS).  FU echo demonstrated EF 40% and mild AI.  He is brought back today for further medication titration with an eye towards starting an ACE inhibitor.   He is here for FU.  He is here alone.  He feels fatigued from the beta-blocker.  States he has a hard time falling asleep. He then sleeps until 11 am.  He denies significant dyspnea. He does note sharp L sided chest pain. This lasts only seconds.  It is no assoc with exertion.  He has noticed it for the last 2 weeks.  He denies any assoc symptoms.  He sleeps on 4 pillows chronically.  No change. No PND.  No edema.  No syncope.     Studies/Reports Reviewed Today:  Chest CTA 10/18/14 IMPRESSION: 1. Minimal increase in size of ascending thoracic aortic aneurysm, which currently measures 5.0 cm in diameter. There is also mild dilatation of the aortic root, which measures up to 4.1 cm at the level of the sinuses of Valsalva. No evidence of thoracic aortic dissection at this time. Recommend semi-annual imaging followup by CTA or MRA and referral to cardiothoracic surgery if not already obtained. This recommendation follows 2010 ACCF/AHA/AATS/ACR/ASA/SCA/SCAI/SIR/STS/SVM Guidelines for the Diagnosis and Management of Patients With Thoracic Aortic  Disease. Circulation. 2010; 121: W098-J191. 2. In addition, there are atherosclerotic calcifications in the left anterior descending, left circumflex and right coronary arteries. 3. Mild hepatic steatosis. 4. Cholelithiasis.  Echo 7/16 Diff HK worse in inf base, EF 40%, Gr 1 DD, mild AI, mild MR, mod LAE  LHC 11/14 LM:   minor irregularity. LAD:  diffuse irregularities but no significant stenoses throughout the course of the LAD LCx:   widely patent without obstructive disease. RCA:  widely patent.  Left ventriculography:   inferolateral wall is akinetic. The anterolateral wall and apex appear hypokinetic. The basal inferior wall is hypokinetic. EF 35%. Final Conclusions:  1. Widely patent coronary arteries with minor luminal irregularities 2. Severe segmental left ventricular systolic dysfunction Recommendations: Medical therapy for nonischemic cardiomyopathy.    Past Medical History  Diagnosis Date  . Malfunction of penile prosthesis   . SUI (stress urinary incontinence), male   . ED (erectile dysfunction)   . Diabetes mellitus ORAL MED  . Arthritis   . Nocturia   . Malfunction of artificial urethral sphincter   . Glaucoma of right eye   . History of prostate cancer     Past Surgical History  Procedure Laterality Date  . I & d/ orif right index finger  01-19-2005  . Removal and replacement gu sphinter cuff  05-15-2004    STRESS URINARY INCONTINENCE  . Placement artificial urinary sphinter  01-20-2001    [id number]  . Insertion inflatable penile prosthesis  06-22-1999  MENTOR ALPHA I  . Penile prosthesis implant  09/24/2011    Procedure: PENILE PROTHESIS INFLATABLE;  Surgeon: [name], MD;  Location: [facility];  Service: Urology;  Laterality: N/A;  REPLACEMENT OF INFLATABLE PENILE PROTHESIS (COLOPLAST)    . Left heart catheterization with coronary angiogram N/A 03/15/2013    Procedure: LEFT HEART CATHETERIZATION WITH CORONARY ANGIOGRAM;   Surgeon: [name], MD;  Location: [facility] CATH LAB;  Service: Cardiovascular;  Laterality: N/A;     Current Outpatient Prescriptions  Medication Sig Dispense Refill  . aspirin 81 MG tablet Take 81 mg by mouth daily.    Marland Kitchen b complex vitamins tablet Take 2 tablets by mouth daily. 500mg .    . Cholecalciferol (VITAMIN D) 2000 UNITS CAPS Take 1 capsule by mouth daily.     Marland Kitchen docusate sodium (COLACE) 100 MG capsule Take 100 mg by mouth as needed for mild constipation.    . dorzolamide (TRUSOPT) 2 % ophthalmic solution Place 1 drop into both eyes daily.    Marland Kitchen ezetimibe (ZETIA) 10 MG tablet Take 10 mg by mouth daily.    Marland Kitchen glipiZIDE (GLUCOTROL) 5 MG tablet Take 7.5 mg by mouth daily. Take 1/2 tablet in the morning and 1 tablet in the evening    . latanoprost (XALATAN) 0.005 % ophthalmic solution Place 1 drop into both eyes at bedtime.     . meclizine (ANTIVERT) 25 MG tablet Take 25 mg by mouth 2 (two) times daily.    . metFORMIN (GLUCOPHAGE) 500 MG tablet Take 500 mg by mouth 2 (two) times daily with a meal.    . metoprolol succinate (TOPROL XL) 25 MG 24 hr tablet Take 1 tablet (25 mg total) by mouth at bedtime. 90 tablet 3  . [START ON 12/30/2014] lisinopril (PRINIVIL,ZESTRIL) 5 MG tablet Take 1 tablet (5 mg total) by mouth daily. 90 tablet 3   No current facility-administered medications for this visit.    Allergies:   Avandia and Metformin and related    Social History:  The patient  reports that he has never smoked. He has never used smokeless tobacco. He reports that he does not drink alcohol or use illicit drugs.   Family History:  The patient's family history includes Heart Problems in his mother; Heart attack in his father; Other in his sister and sister.    ROS:   Please see the history of present illness.   Review of Systems  All other systems reviewed and are negative.     PHYSICAL EXAM: VS:  BP 136/52 mmHg  Pulse 61  Ht 5\' 10"  (1.778 m)  Wt 235 lb (106.595 kg)  BMI 33.72  kg/m2  SpO2 95%    Wt Readings from Last 3 Encounters:  12/23/14 235 lb (106.595 kg)  10/21/14 231 lb 12.8 oz (105.144 kg)  10/11/13 230 lb 12.8 oz (104.69 kg)     GEN: Well nourished, well developed, in no acute distress HEENT: normal Neck: no JVD,  no masses Cardiac:  Normal S1/S2, RRR; no murmur ,  no rubs or gallops, no edema   Chest:  No tenderness to palpation; no L chest masses noted Respiratory:  clear to auscultation bilaterally, no wheezing, rhonchi or rales. GI: soft, nontender, nondistended, + BS MS: no deformity or atrophy Skin: warm and dry  Neuro:  CNs II-XII intact, Strength and sensation are intact Psych: Normal affect   EKG:  EKG is ordered today.  It demonstrates:   Sinus brady, HR 55, 1st  degree AVB, PR 224, RBBB, biphasic TW 2, 3, aVF, V4-6, no change from prior tracing   Recent Labs: 10/14/2014: BUN 20; Creatinine, Ser 1.13; Potassium 4.6; Sodium 137    Lipid Panel No results found for: CHOL, TRIG, HDL, CHOLHDL, VLDL, LDLCALC, LDLDIRECT    ASSESSMENT AND PLAN:  Chest pain, unspecified chest pain type:   Chest symptoms are atypical for ischemia. He had a cardiac catheterization demonstrating normal coronary arteries 2 years ago.  The likelihood that his symptoms are ischemia mediated is quite low. I will obtain an EKG today >> ECG today is similar to prior tracings.  I suspect his symptoms are musculoskeletal in nature. Conservative management is recommended. He will notify us if he has any progressive or worsening symptoms.  NICM (nonischemic cardiomyopathy):   He has experienced some fatigue from beta blocker therapy. I have suggested that he change this to once daily at bedtime.   I will also place him on lisinopril 5 mg daily. He will start this in about one week and return for a BMET in 2 weeks.  Thoracic aortic aneurysm:   Stable by recent CTA. Study will be repeated in June 2017.  Aortic insufficiency:   Mild by recent  echocardiogram.  Hyperlipidemia:   Continue current therapy.     Medication Changes: Current medicines are reviewed at length with the patient today.  Concerns regarding medicines are as outlined above.  The following changes have been made:   Discontinued Medications   No medications on file   Modified Medications   Modified Medication Previous Medication   METOPROLOL SUCCINATE (TOPROL XL) 25 MG 24 HR TABLET metoprolol succinate (TOPROL XL) 25 MG 24 hr tablet      Take 1 tablet (25 mg total) by mouth at bedtime.    Take 1 tablet (25 mg total) by mouth daily.   New Prescriptions   LISINOPRIL (PRINIVIL,ZESTRIL) 5 MG TABLET    Take 1 tablet (5 mg total) by mouth daily.    Labs/ tests ordered today include:   Orders Placed This Encounter  Procedures  . Basic Metabolic Panel (BMET)      Disposition:    FU with me in 6-8 weeks.    Signed, Brynda Rim, MHS 12/23/2014 2:59 PM    [facility] [address] [place], [place], [place]  [address] Phone: [phone]; Fax: [phone]

## 2014-12-23 ENCOUNTER — Encounter: Payer: Self-pay | Admitting: Physician Assistant

## 2014-12-23 ENCOUNTER — Ambulatory Visit (INDEPENDENT_AMBULATORY_CARE_PROVIDER_SITE_OTHER): Payer: Medicare Other | Admitting: Physician Assistant

## 2014-12-23 VITALS — BP 136/52 | HR 61 | Ht 70.0 in | Wt 235.0 lb

## 2014-12-23 DIAGNOSIS — I712 Thoracic aortic aneurysm, without rupture, unspecified: Secondary | ICD-10-CM

## 2014-12-23 DIAGNOSIS — I77810 Thoracic aortic ectasia: Secondary | ICD-10-CM

## 2014-12-23 DIAGNOSIS — E785 Hyperlipidemia, unspecified: Secondary | ICD-10-CM

## 2014-12-23 DIAGNOSIS — R079 Chest pain, unspecified: Secondary | ICD-10-CM | POA: Diagnosis not present

## 2014-12-23 DIAGNOSIS — I351 Nonrheumatic aortic (valve) insufficiency: Secondary | ICD-10-CM

## 2014-12-23 DIAGNOSIS — I429 Cardiomyopathy, unspecified: Secondary | ICD-10-CM

## 2014-12-23 DIAGNOSIS — I428 Other cardiomyopathies: Secondary | ICD-10-CM

## 2014-12-23 MED ORDER — LISINOPRIL 5 MG PO TABS: 5.0000 mg | ORAL_TABLET | Freq: Every day | ORAL | Status: DC

## 2014-12-23 MED ORDER — METOPROLOL SUCCINATE ER 25 MG PO TB24: 25.0000 mg | ORAL_TABLET | Freq: Every day | ORAL | Status: DC

## 2014-12-23 NOTE — Addendum Note (Signed)
Addended by: Tarri Fuller on: 12/23/2014 03:05 PM   Modules accepted: Orders

## 2014-12-23 NOTE — Patient Instructions (Signed)
Medication Instructions:  1. CHANGE TOPROL XL TO TAKE AT BEDTIME  2. START 12/30/14  LISINOPRIL 5 MG DAILY  Labwork: 01/06/15 BMET  Testing/Procedures: NONE  Follow-Up: 02/06/15 @ 2 PM SCOTT WEAVER, PAC SAME DAY DR. [name] IS IN THE OFFICE  Any Other Special Instructions Will Be Listed Below (If Applicable).

## 2014-12-25 NOTE — Addendum Note (Signed)
Addended by: Reesa Chew on: 12/25/2014 05:41 PM   Modules accepted: Orders

## 2015-01-06 ENCOUNTER — Other Ambulatory Visit (INDEPENDENT_AMBULATORY_CARE_PROVIDER_SITE_OTHER): Payer: Medicare Other | Admitting: *Deleted

## 2015-01-06 DIAGNOSIS — I429 Cardiomyopathy, unspecified: Secondary | ICD-10-CM | POA: Diagnosis not present

## 2015-01-06 DIAGNOSIS — I428 Other cardiomyopathies: Secondary | ICD-10-CM

## 2015-01-06 LAB — BASIC METABOLIC PANEL
BUN: 16 mg/dL (ref 6–23)
CO2: 25 mEq/L (ref 19–32)
Calcium: 8.8 mg/dL (ref 8.4–10.5)
Chloride: 106 mEq/L (ref 96–112)
Creatinine, Ser: 1.01 mg/dL (ref 0.40–1.50)
GFR: 76.99 mL/min (ref >60.00)
Glucose, Bld: 213 mg/dL — ABNORMAL HIGH (ref 70–99)
POTASSIUM: 4.4 meq/L (ref 3.5–5.1)
Sodium: 139 mEq/L (ref 135–145)

## 2015-01-07 ENCOUNTER — Telehealth: Payer: Self-pay | Admitting: Cardiovascular Disease

## 2015-01-07 NOTE — Telephone Encounter (Signed)
Would try to cut in half and take in am. If unable to tolerate lower dose because of side effects, ok to stop

## 2015-01-07 NOTE — Telephone Encounter (Signed)
I spoke with the pt and since he started Metoprolol Succinate in June he has had difficulty falling asleep.  When the pt saw [name] PA-C he was advised to start taking this medication at bedtime.  Over the past few nights the pt has started having nightmares and actually woke up this morning punching his closet door.  I advised the pt to go back to taking his Metoprolol Succinate in the morning and the pt is going to continue taking lisinopril.  The pt said the [place] also gave him sleeping pills but he is not comfortable trying them.  I made the pt aware that he can try OTC Tylenol PM for insomnia.  I will forward this message to Dr [name] to review and determine if he would like to recommend a change in beta blocker at this time.

## 2015-01-07 NOTE — Telephone Encounter (Signed)
New Message  Pt stated that current regimen of medications is causing insomnia and nightmares. Please call back and discuss.

## 2015-01-08 ENCOUNTER — Telehealth: Payer: Self-pay | Admitting: *Deleted

## 2015-01-08 NOTE — Telephone Encounter (Signed)
I spoke with the pt and he did not take his metoprolol succinate last night and he was able to sleep and did not have any nightmares. The pt plans on trying to take his Metoprolol 25mg  this morning and see how this effects his sleep tonight. I advised the pt that if he continues to have difficulty with insomnia and nightmares then Dr [name] recommends that he cut his dosage in half and take in the morning.  If he continues to have problems after making this change then the pt can stop this medication.  The pt verbalized understanding of these instructions.

## 2015-01-08 NOTE — Telephone Encounter (Signed)
Pt notified of lab results by phone with verbal understanding.  

## 2015-02-05 NOTE — Progress Notes (Signed)
Cardiology Office Note   Date:  02/06/2015   ID:  Alan [name], DOB Sep 11, 1940, MRN [id number]  PCP:  [name], MD  Cardiologist:  Dr. [name]   Electrophysiologist:  n/a  Chief Complaint  Patient presents with  . Follow-up  . Cardiomyopathy  . Chest Pain     History of Present Illness: Alan [name] is a 74 y.o. male with a hx of NICM, ascending thoracic aortic aneurysm, mod AI, diabetes, prostate CA.  EF has improved in the past.  Last seen by Dr. [name] 6/16.  FU CTA demonstrated stable dilation of ascending thoracic aorta at 5 cm.  He will need repeat CTA in 1 year.  His medical regimen was intensified with the addition of a beta-blocker (Toprol-XL 25 QHS).  FU echo demonstrated EF 40% and mild AI.    I saw him in FU 12/23/14.  He felt fatigued from the beta-blocker.   I started him on an ACE inhibitor at last visit.  He returns for FU.  He is doing well.  The patient denies chest pain, shortness of breath, syncope, orthopnea, PND or significant pedal edema.     Studies/Reports Reviewed Today:  Chest CTA 10/18/14 IMPRESSION: 1. Minimal increase in size of ascending thoracic aortic aneurysm, which currently measures 5.0 cm in diameter. There is also mild dilatation of the aortic root, which measures up to 4.1 cm at the level of the sinuses of Valsalva. No evidence of thoracic aortic dissection at this time. Recommend semi-annual imaging followup by CTA or MRA and referral to cardiothoracic surgery if not already obtained. This recommendation follows 2010 ACCF/AHA/AATS/ACR/ASA/SCA/SCAI/SIR/STS/SVM Guidelines for the Diagnosis and Management of Patients With Thoracic Aortic Disease. Circulation. 2010; 121: N829-F621. 2. In addition, there are atherosclerotic calcifications in the left anterior descending, left circumflex and right coronary arteries. 3. Mild hepatic steatosis. 4. Cholelithiasis.  Echo 7/16 Diff HK worse in  inf base, EF 40%, Gr 1 DD, mild AI, mild MR, mod LAE  LHC 11/14 LM:   minor irregularity. LAD:  diffuse irregularities but no significant stenoses throughout the course of the LAD LCx:   widely patent without obstructive disease. RCA:  widely patent.  Left ventriculography:   inferolateral wall is akinetic. The anterolateral wall and apex appear hypokinetic. The basal inferior wall is hypokinetic. EF 35%. Final Conclusions:  1. Widely patent coronary arteries with minor luminal irregularities 2. Severe segmental left ventricular systolic dysfunction Recommendations: Medical therapy for nonischemic cardiomyopathy.    Past Medical History  Diagnosis Date  . Malfunction of penile prosthesis (HCC)   . SUI (stress urinary incontinence), male   . ED (erectile dysfunction)   . Diabetes mellitus ORAL MED  . Arthritis   . Nocturia   . Malfunction of artificial urethral sphincter (HCC)   . Glaucoma of right eye   . History of prostate cancer     Past Surgical History  Procedure Laterality Date  . I & d/ orif right index finger  01-19-2005  . Removal and replacement gu sphinter cuff  05-15-2004    STRESS URINARY INCONTINENCE  . Placement artificial urinary sphinter  01-20-2001    [id number]  . Insertion inflatable penile prosthesis  06-22-1999    MENTOR ALPHA I  . Penile prosthesis implant  09/24/2011    Procedure: PENILE PROTHESIS INFLATABLE;  Surgeon: [name], MD;  Location: [facility];  Service: Urology;  Laterality: N/A;  REPLACEMENT OF INFLATABLE PENILE PROTHESIS (COLOPLAST)    .  Left heart catheterization with coronary angiogram N/A 03/15/2013    Procedure: LEFT HEART CATHETERIZATION WITH CORONARY ANGIOGRAM;  Surgeon: [name], MD;  Location: [facility] CATH LAB;  Service: Cardiovascular;  Laterality: N/A;     Current Outpatient Prescriptions  Medication Sig Dispense Refill  . aspirin 81 MG tablet Take 81 mg by mouth daily.    Marland Kitchen b complex vitamins  tablet Take 2 tablets by mouth daily. 500mg .    . Cholecalciferol (VITAMIN D) 2000 UNITS CAPS Take 1 capsule by mouth daily.     Marland Kitchen docusate sodium (COLACE) 100 MG capsule Take 100 mg by mouth as needed for mild constipation.    . dorzolamide (TRUSOPT) 2 % ophthalmic solution Place 1 drop into both eyes daily.    Marland Kitchen ezetimibe (ZETIA) 10 MG tablet Take 10 mg by mouth daily.    Marland Kitchen glipiZIDE (GLUCOTROL) 5 MG tablet Take 7.5 mg by mouth daily. Take 1/2 tablet in the morning and 1 tablet in the evening    . latanoprost (XALATAN) 0.005 % ophthalmic solution Place 1 drop into both eyes at bedtime.     Marland Kitchen lisinopril (PRINIVIL,ZESTRIL) 5 MG tablet Take 1 tablet (5 mg total) by mouth daily. 90 tablet 3  . meclizine (ANTIVERT) 25 MG tablet Take 25 mg by mouth 2 (two) times daily.    . metFORMIN (GLUCOPHAGE) 500 MG tablet Take 500 mg by mouth 2 (two) times daily with a meal.    . metoprolol succinate (TOPROL XL) 25 MG 24 hr tablet Take 1 tablet (25 mg total) by mouth at bedtime. 90 tablet 3   No current facility-administered medications for this visit.    Allergies:   Avandia and Metformin and related    Social History:  The patient  reports that he has never smoked. He has never used smokeless tobacco. He reports that he does not drink alcohol or use illicit drugs.   Family History:  The patient's family history includes Heart Problems in his mother; Heart attack in his father; Other in his sister and sister.    ROS:   Please see the history of present illness.   Review of Systems  All other systems reviewed and are negative.     PHYSICAL EXAM: VS:  BP 116/60 mmHg  Pulse 58  Ht 5\' 10"  (1.778 m)  Wt 231 lb 12.8 oz (105.144 kg)  BMI 33.26 kg/m2    Wt Readings from Last 3 Encounters:  02/06/15 231 lb 12.8 oz (105.144 kg)  12/23/14 235 lb (106.595 kg)  10/21/14 231 lb 12.8 oz (105.144 kg)     GEN: Well nourished, well developed, in no acute distress HEENT: normal Neck: no JVD,  no  masses Cardiac:  Normal S1/S2, RRR; no murmur ,  no rubs or gallops, no edema   Chest:  No tenderness to palpation; no L chest masses noted Respiratory:  clear to auscultation bilaterally, no wheezing, rhonchi or rales. GI: soft, nontender, nondistended, + BS MS: no deformity or atrophy Skin: warm and dry  Neuro:  CNs II-XII intact, Strength and sensation are intact Psych: Normal affect   EKG:  EKG is not ordered today.  It demonstrates:   n/a   Recent Labs: 01/06/2015: BUN 16; Creatinine, Ser 1.01; Potassium 4.4; Sodium 139    Lipid Panel No results found for: CHOL, TRIG, HDL, CHOLHDL, VLDL, LDLCALC, LDLDIRECT    ASSESSMENT AND PLAN:  1. Chest pain, unspecified chest pain type:   No recurrence since last seen.  This  was likely MSK.  2. NICM (nonischemic cardiomyopathy):   EF by echo in 7/16 was 40%.  Continue beta-blocker, ACE inhibitor.  No signs of CHF.  3. Thoracic aortic aneurysm:   Stable by recent CTA. Study will be repeated in June 2017.  4. Aortic insufficiency:   Mild by recent echocardiogram.  5. Hyperlipidemia:   Continue current therapy.     Medication Changes: Current medicines are reviewed at length with the patient today.  Concerns regarding medicines are as outlined above.  The following changes have been made:   Discontinued Medications   No medications on file   Modified Medications   No medications on file   New Prescriptions   No medications on file   Labs/ tests ordered today include:   No orders of the defined types were placed in this encounter.      Disposition:    FU with me in 6-8 weeks.    Signed, Brynda Rim, MHS 02/06/2015 2:15 PM    [facility] [address] [place], [place], [place]  [address] Phone: [phone]; Fax: [phone]

## 2015-02-06 ENCOUNTER — Encounter: Payer: Self-pay | Admitting: Physician Assistant

## 2015-02-06 ENCOUNTER — Ambulatory Visit (INDEPENDENT_AMBULATORY_CARE_PROVIDER_SITE_OTHER): Payer: Medicare Other | Admitting: Physician Assistant

## 2015-02-06 VITALS — BP 116/60 | HR 58 | Ht 70.0 in | Wt 231.8 lb

## 2015-02-06 DIAGNOSIS — E785 Hyperlipidemia, unspecified: Secondary | ICD-10-CM

## 2015-02-06 DIAGNOSIS — I429 Cardiomyopathy, unspecified: Secondary | ICD-10-CM | POA: Diagnosis not present

## 2015-02-06 DIAGNOSIS — I351 Nonrheumatic aortic (valve) insufficiency: Secondary | ICD-10-CM

## 2015-02-06 DIAGNOSIS — I712 Thoracic aortic aneurysm, without rupture, unspecified: Secondary | ICD-10-CM

## 2015-02-06 DIAGNOSIS — I428 Other cardiomyopathies: Secondary | ICD-10-CM | POA: Insufficient documentation

## 2015-02-06 HISTORY — DX: Thoracic aortic aneurysm, without rupture: I71.2

## 2015-02-06 HISTORY — DX: Thoracic aortic aneurysm, without rupture, unspecified: I71.20

## 2015-02-06 HISTORY — DX: Other cardiomyopathies: I42.8

## 2015-02-06 NOTE — Patient Instructions (Signed)
Medication Instructions:  No changes  Labwork: None today   Testing/Procedures: None   Follow-Up: Dr. [name] in 09/2015. Make sure you get the Chest CT arranged before your visit with Dr. [name].   Any Other Special Instructions Will Be Listed Below (If Applicable).

## 2015-07-06 ENCOUNTER — Emergency Department (HOSPITAL_BASED_OUTPATIENT_CLINIC_OR_DEPARTMENT_OTHER)
Admission: EM | Admit: 2015-07-06 | Discharge: 2015-07-06 | Disposition: A | Discharge status: Home / Self Care | Payer: Medicare Other | Attending: Emergency Medicine | Admitting: Emergency Medicine

## 2015-07-06 ENCOUNTER — Encounter (HOSPITAL_BASED_OUTPATIENT_CLINIC_OR_DEPARTMENT_OTHER): Payer: Self-pay | Admitting: *Deleted

## 2015-07-06 DIAGNOSIS — E1165 Type 2 diabetes mellitus with hyperglycemia: Secondary | ICD-10-CM | POA: Insufficient documentation

## 2015-07-06 DIAGNOSIS — N390 Urinary tract infection, site not specified: Secondary | ICD-10-CM | POA: Insufficient documentation

## 2015-07-06 DIAGNOSIS — Z8546 Personal history of malignant neoplasm of prostate: Secondary | ICD-10-CM | POA: Insufficient documentation

## 2015-07-06 DIAGNOSIS — Z7982 Long term (current) use of aspirin: Secondary | ICD-10-CM | POA: Diagnosis not present

## 2015-07-06 DIAGNOSIS — M1990 Unspecified osteoarthritis, unspecified site: Secondary | ICD-10-CM | POA: Insufficient documentation

## 2015-07-06 DIAGNOSIS — H409 Unspecified glaucoma: Secondary | ICD-10-CM | POA: Diagnosis not present

## 2015-07-06 DIAGNOSIS — Z7984 Long term (current) use of oral hypoglycemic drugs: Secondary | ICD-10-CM | POA: Diagnosis not present

## 2015-07-06 DIAGNOSIS — Z8619 Personal history of other infectious and parasitic diseases: Secondary | ICD-10-CM | POA: Diagnosis not present

## 2015-07-06 DIAGNOSIS — Z9889 Other specified postprocedural states: Secondary | ICD-10-CM | POA: Diagnosis not present

## 2015-07-06 DIAGNOSIS — R739 Hyperglycemia, unspecified: Secondary | ICD-10-CM

## 2015-07-06 DIAGNOSIS — R319 Hematuria, unspecified: Secondary | ICD-10-CM | POA: Diagnosis present

## 2015-07-06 DIAGNOSIS — Z79899 Other long term (current) drug therapy: Secondary | ICD-10-CM | POA: Diagnosis not present

## 2015-07-06 LAB — URINE MICROSCOPIC-ADD ON

## 2015-07-06 LAB — URINALYSIS, ROUTINE W REFLEX MICROSCOPIC
BILIRUBIN URINE: NEGATIVE
Ketones, ur: NEGATIVE mg/dL
Leukocytes, UA: NEGATIVE
Nitrite: NEGATIVE
PH: 6 (ref 5.0–8.0)
Protein, ur: 100 mg/dL — AB
SPECIFIC GRAVITY, URINE: 1.025 (ref 1.005–1.030)

## 2015-07-06 LAB — CBG MONITORING, ED: GLUCOSE-CAPILLARY: 227 mg/dL — AB (ref 65–99)

## 2015-07-06 MED ORDER — CEFTRIAXONE SODIUM 1 G IJ SOLR
1.0000 g | Freq: Once | INTRAMUSCULAR | Status: AC
  Administered 2015-07-06: 1 g via INTRAMUSCULAR
  Filled 2015-07-06: qty 10

## 2015-07-06 MED ORDER — CEPHALEXIN 500 MG PO CAPS: 500.0000 mg | ORAL_CAPSULE | Freq: Two times a day (BID) | ORAL | Status: DC

## 2015-07-06 NOTE — ED Notes (Signed)
Ambulatory to room, steady gait, no changes. Declined to wear gown. Declined to have door shut.

## 2015-07-06 NOTE — ED Provider Notes (Signed)
CSN: [id number]     Arrival date & time 07/06/15  0341 History   First MD Initiated Contact with Patient 07/06/15 [phone]     Chief Complaint  Patient presents with  . Hematuria     (Consider location/radiation/quality/duration/timing/severity/associated sxs/prior Treatment) HPI  This is a 75 yo male with hx of DM and UTI who presents with hematuria.   Patient reports 3 day history of urgency and burning sensation at the tip of his penis. He has a history of diabetes and yeast infections. He's been using a topical yeast cream with some improvement of the burning; however, earlier this evening he noticed bright red blood in his urine. He denies any fevers or back pain. He recently decreased his metformin because he thought it may be preventing him from sleeping well at night.    Past Medical History  Diagnosis Date  . Malfunction of penile prosthesis (HCC)   . SUI (stress urinary incontinence), male   . ED (erectile dysfunction)   . Diabetes mellitus ORAL MED  . Arthritis   . Nocturia   . Malfunction of artificial urethral sphincter (HCC)   . Glaucoma of right eye   . History of prostate cancer    Past Surgical History  Procedure Laterality Date  . I & d/ orif right index finger  01-19-2005  . Removal and replacement gu sphinter cuff  05-15-2004    STRESS URINARY INCONTINENCE  . Placement artificial urinary sphinter  01-20-2001    [id number]  . Insertion inflatable penile prosthesis  06-22-1999    MENTOR ALPHA I  . Penile prosthesis implant  09/24/2011    Procedure: PENILE PROTHESIS INFLATABLE;  Surgeon: [name], MD;  Location: [facility];  Service: Urology;  Laterality: N/A;  REPLACEMENT OF INFLATABLE PENILE PROTHESIS (COLOPLAST)    . Left heart catheterization with coronary angiogram N/A 03/15/2013    Procedure: LEFT HEART CATHETERIZATION WITH CORONARY ANGIOGRAM;  Surgeon: [name], MD;  Location: [facility] CATH LAB;  Service: Cardiovascular;  Laterality:  N/A;   Family History  Problem Relation Age of Onset  . Heart attack Father   . Heart Problems Mother   . Other Sister     NO HEART ISSUES  . Other Sister     NO HEART ISSUES   Social History  Substance Use Topics  . Smoking status: Never Smoker   . Smokeless tobacco: Never Used  . Alcohol Use: No    Review of Systems  Constitutional: Negative for fever.  Gastrointestinal: Negative for nausea, vomiting and abdominal pain.  Genitourinary: Positive for dysuria, hematuria and difficulty urinating.  Musculoskeletal: Negative for back pain.  All other systems reviewed and are negative.     Allergies  Avandia and Metformin and related  Home Medications   Prior to Admission medications   Medication Sig Start Date End Date Taking? Authorizing Provider  aspirin 81 MG tablet Take 81 mg by mouth daily.    Historical Provider, MD  b complex vitamins tablet Take 2 tablets by mouth daily. 500mg .    Historical Provider, MD  cephALEXin (KEFLEX) 500 MG capsule Take 1 capsule (500 mg total) by mouth 2 (two) times daily. 07/06/15   Shon Baton, MD  Cholecalciferol (VITAMIN D) 2000 UNITS CAPS Take 1 capsule by mouth daily.     Historical Provider, MD  docusate sodium (COLACE) 100 MG capsule Take 100 mg by mouth as needed for mild constipation.    Historical Provider, MD  dorzolamide (TRUSOPT) 2 %  ophthalmic solution Place 1 drop into both eyes daily.    Historical Provider, MD  ezetimibe (ZETIA) 10 MG tablet Take 10 mg by mouth daily.    Historical Provider, MD  glipiZIDE (GLUCOTROL) 5 MG tablet Take 7.5 mg by mouth daily. Take 1/2 tablet in the morning and 1 tablet in the evening    Historical Provider, MD  latanoprost (XALATAN) 0.005 % ophthalmic solution Place 1 drop into both eyes at bedtime.     Historical Provider, MD  lisinopril (PRINIVIL,ZESTRIL) 5 MG tablet Take 1 tablet (5 mg total) by mouth daily. 12/30/14   Beatrice Lecher, PA-C  meclizine (ANTIVERT) 25 MG tablet Take 25 mg by  mouth 2 (two) times daily.    Historical Provider, MD  metFORMIN (GLUCOPHAGE) 500 MG tablet Take 500 mg by mouth 2 (two) times daily with a meal.    Historical Provider, MD  metoprolol succinate (TOPROL XL) 25 MG 24 hr tablet Take 1 tablet (25 mg total) by mouth at bedtime. 12/23/14   Scott T Weaver, PA-C   BP 155/63 mmHg  Pulse 68  Temp(Src) 98.6 F (37 C) (Oral)  Resp 18  Ht 5\' 9"  (1.753 m)  Wt 231 lb (104.781 kg)  BMI 34.10 kg/m2  SpO2 97% Physical Exam  Constitutional: He is oriented to person, place, and time. He appears well-developed and well-nourished. No distress.  HENT:  Head: Normocephalic and atraumatic.  Cardiovascular: Normal rate, regular rhythm and normal heart sounds.   No murmur heard. Pulmonary/Chest: Effort normal and breath sounds normal. No respiratory distress. He has no wheezes.  Abdominal: Soft. There is no tenderness.  Genitourinary:  Normal uncircumcised penis, no erythema or skin changes noted of the glans, no discharge noted  Musculoskeletal: He exhibits no edema.  Neurological: He is alert and oriented to person, place, and time.  Skin: Skin is warm and dry.  Psychiatric: He has a normal mood and affect.  Nursing note and vitals reviewed.   ED Course  Procedures (including critical care time) Labs Review Labs Reviewed  URINALYSIS, ROUTINE W REFLEX MICROSCOPIC (NOT AT [facility]) - Abnormal; Notable for the following:    Color, Urine RED (*)    APPearance TURBID (*)    Glucose, UA >1000 (*)    Hgb urine dipstick LARGE (*)    Protein, ur 100 (*)    All other components within normal limits  URINE MICROSCOPIC-ADD ON - Abnormal; Notable for the following:    Squamous Epithelial / LPF 0-5 (*)    Bacteria, UA FEW (*)    All other components within normal limits  CBG MONITORING, ED - Abnormal; Notable for the following:    Glucose-Capillary 227 (*)    All other components within normal limits  URINE CULTURE    Imaging Review No results found. I  have personally reviewed and evaluated these images and lab results as part of my medical decision-making.   EKG Interpretation None      MDM   Final diagnoses:  UTI (lower urinary tract infection)  Hyperglycemia    Patient presents with hematuria. History of UTIs. Denies back pain or fever that would suggest pyelonephritis. He does have too numerous to count white cells and too numerous count red cells in the urine with few bacteria. Urine culture sent. Patient was given IM Rocephin. CBG 227. Discussed with patient restarting metformin at prescribed dose. Patient stated understanding.  After history, exam, and medical workup I feel the patient has been appropriately medically screened and  is safe for discharge home. Pertinent diagnoses were discussed with the patient. Patient was given return precautions.     Shon Baton, MD 07/06/15 [phone]

## 2015-07-06 NOTE — ED Notes (Signed)
Dr. Horton at BS.  

## 2015-07-06 NOTE — Discharge Instructions (Signed)

## 2015-07-06 NOTE — ED Notes (Signed)
C/o bright red blood in urine, first noticed at 0200, has been dealing with a yeast infection, wears a pad, h/o UTI, "thinks it is the same", also reports frequency and urgency and little output (denies: nvd, fever or other bleeding), h/o DM. (Does not check BS).

## 2015-07-08 LAB — URINE CULTURE: Culture: 80000

## 2015-07-09 ENCOUNTER — Telehealth: Payer: Self-pay | Admitting: *Deleted

## 2015-07-09 NOTE — ED Notes (Signed)
(+)  urine culture, treated with Cephalexin, OK per Meagan Decher, PharmD 

## 2015-09-05 ENCOUNTER — Telehealth: Payer: Self-pay | Admitting: Cardiovascular Disease

## 2015-09-05 DIAGNOSIS — I7121 Aneurysm of the ascending aorta, without rupture: Secondary | ICD-10-CM

## 2015-09-05 DIAGNOSIS — I712 Thoracic aortic aneurysm, without rupture: Secondary | ICD-10-CM

## 2015-09-05 DIAGNOSIS — I351 Nonrheumatic aortic (valve) insufficiency: Secondary | ICD-10-CM

## 2015-09-05 NOTE — Telephone Encounter (Signed)
I will need to speak with Dr [name] before placing order for this test.

## 2015-09-05 NOTE — Telephone Encounter (Signed)
Follow-up      Pt needing to set-up CTA of the chest, no order in the computer, already set-up appointment on May 26 th at 3:15pm, pt got a letter to set-up appt.

## 2015-09-05 NOTE — Telephone Encounter (Signed)
New message      The pt is calling about a letter referral for a CTA of the chest  Order, that needs to be done before their appointment on June 26 th at 3:15 pm. The order needs to placed in the computer before the test can be done for the pt.

## 2015-09-09 NOTE — Telephone Encounter (Signed)
Left message on machine for pt to contact the office.   The pt will need a BMP drawn prior to scheduling test.  No allergy to contrast.  Per medication list the pt is taking Metformin and this will need to be held for testing.

## 2015-09-09 NOTE — Telephone Encounter (Signed)
F/u  Pt retruning RN phone call. Please call back and discuss.   

## 2015-09-09 NOTE — Telephone Encounter (Signed)
I spoke with the pt and made him aware that he will need a BMP performed prior to scheduling CT at the hospital. The pt will have this lab drawn on 09/17/15.  After results are available I will schedule GATED CTA of Chest at the hospital for Dr Llana Aliment to read and then change the pt's follow-up appointment to Dr [name].

## 2015-09-09 NOTE — Telephone Encounter (Signed)
Alan Bollman, MD  Iona Coach, RN           Gated CTA of the chest is right - doesn't need to be gated cardiac. Entrikin told me gating the chest scan reduces motion in the aortic root and gives better definition of the proximal aorta

## 2015-09-17 ENCOUNTER — Other Ambulatory Visit (INDEPENDENT_AMBULATORY_CARE_PROVIDER_SITE_OTHER): Payer: Medicare Other | Admitting: *Deleted

## 2015-09-17 DIAGNOSIS — I712 Thoracic aortic aneurysm, without rupture: Secondary | ICD-10-CM

## 2015-09-17 DIAGNOSIS — I351 Nonrheumatic aortic (valve) insufficiency: Secondary | ICD-10-CM

## 2015-09-17 DIAGNOSIS — I7121 Aneurysm of the ascending aorta, without rupture: Secondary | ICD-10-CM

## 2015-09-17 LAB — BASIC METABOLIC PANEL
BUN: 19 mg/dL (ref 7–25)
CO2: 24 mmol/L (ref 20–31)
CREATININE: 1.12 mg/dL (ref 0.70–1.18)
Calcium: 9.2 mg/dL (ref 8.6–10.3)
Chloride: 106 mmol/L (ref 98–110)
Glucose, Bld: 178 mg/dL — ABNORMAL HIGH (ref 65–99)
POTASSIUM: 4.3 mmol/L (ref 3.5–5.3)
Sodium: 140 mmol/L (ref 135–146)

## 2015-09-17 NOTE — Addendum Note (Signed)
Addended by: Tonita Phoenix on: 09/17/2015 01:24 PM   Modules accepted: Orders

## 2015-09-26 NOTE — Telephone Encounter (Signed)
Follow up      Patient calling back to speak with nurse scheduling appt

## 2015-09-26 NOTE — Telephone Encounter (Signed)
I spoke with the pt and he would like to have CT scheduled the afternoon of 6/29.  Follow up scheduled with Dr [name] on 10/30/15 at 4:00. I will contact the pt next week in regards to final appointment information for CT.

## 2015-09-26 NOTE — Telephone Encounter (Signed)
Left message on machine for pt to contact the office in regards to scheduling CT and OV.

## 2015-09-26 NOTE — Telephone Encounter (Signed)
Follow up   Pt returning call for RN   He said he missed call

## 2015-10-01 NOTE — Telephone Encounter (Signed)
Test scheduled for 10/23/15 at 2:00, arrive 1:45 in Admitting at [facility]. The pt can have liquids 4 hours prior to test. No solid food, caffeine or smoking 4 hours prior to CTA. No herbal supplements 24 hours prior to test. No sexual enhancement drugs 72 hours prior to test.  No Metformin the day of procedure or 48 hours after test. I have sent Dr Llana Aliment a message to determine if he is available to read this study. Message has been sent to precert department.

## 2015-10-03 NOTE — Telephone Encounter (Signed)
I spoke with the pt by phone and made him aware of CTA appointment and instructions.  The pt then asked me to call back and leave this information on his answering machine.  Detailed message left and advised pt to contact the office with any other questions or concerns.

## 2015-10-06 ENCOUNTER — Telehealth: Payer: Self-pay | Admitting: Cardiovascular Disease

## 2015-10-06 NOTE — Telephone Encounter (Signed)
Alan [name] is calling because he has a procedure coming up and is wanting to speak to you about getting some medication before the procedure. Thanks

## 2015-10-07 NOTE — Telephone Encounter (Signed)
Patient has been made aware that once Dr. [name] reviews and gives orders for a medication, we will contact him with that information.

## 2015-10-07 NOTE — Telephone Encounter (Signed)
Okay to give Valium 10 mg 1 prior to the CT scan

## 2015-10-07 NOTE — Telephone Encounter (Signed)
Will forward to Dr [name] to give orders for PO medication prior to CT scan.

## 2015-10-07 NOTE — Telephone Encounter (Signed)
Follow-up     The pt is Lauren's call.

## 2015-10-07 NOTE — Telephone Encounter (Signed)
New Message:  Pt called in stating that he is claustrophobic and will needs medication before having the CT done. Please f/u with the pt.

## 2015-10-08 NOTE — Telephone Encounter (Signed)
Dr [name] signed written Rx for Diazepam 10mg  take one tablet by mouth 30 minutes prior to CT scan (#1 with no refills).  I spoke with the pt and he feels like he will not need this medication as long as it is the same form of testing he had in 2016.  I reassured the pt that he is having a CT not an MRI.  If the pt decides that he needs a pre medication then he will contact the office.  The pt will have to have someone drive him to and from the hospital if he requires medication.

## 2015-10-16 ENCOUNTER — Encounter: Payer: Self-pay | Admitting: *Deleted

## 2015-10-20 ENCOUNTER — Ambulatory Visit: Payer: Medicare Other | Admitting: Physician Assistant

## 2015-10-23 ENCOUNTER — Ambulatory Visit (HOSPITAL_COMMUNITY)
Admission: RE | Admit: 2015-10-23 | Discharge: 2015-10-23 | Disposition: A | Discharge status: Home / Self Care | Payer: Medicare Other | Source: Ambulatory Visit | Attending: Cardiovascular Disease | Admitting: Cardiovascular Disease

## 2015-10-23 DIAGNOSIS — K8020 Calculus of gallbladder without cholecystitis without obstruction: Secondary | ICD-10-CM | POA: Diagnosis not present

## 2015-10-23 DIAGNOSIS — I712 Thoracic aortic aneurysm, without rupture: Secondary | ICD-10-CM

## 2015-10-23 DIAGNOSIS — I351 Nonrheumatic aortic (valve) insufficiency: Secondary | ICD-10-CM

## 2015-10-23 DIAGNOSIS — I7121 Aneurysm of the ascending aorta, without rupture: Secondary | ICD-10-CM

## 2015-10-23 MED ORDER — IOPAMIDOL (ISOVUE-370) INJECTION 76%
INTRAVENOUS | Status: AC
  Administered 2015-10-23: 100 mL
  Filled 2015-10-23: qty 100

## 2015-10-30 ENCOUNTER — Ambulatory Visit (INDEPENDENT_AMBULATORY_CARE_PROVIDER_SITE_OTHER): Payer: Medicare Other | Admitting: Cardiovascular Disease

## 2015-10-30 ENCOUNTER — Encounter: Payer: Self-pay | Admitting: Cardiovascular Disease

## 2015-10-30 VITALS — BP 144/60 | HR 70 | Ht 69.0 in | Wt 235.0 lb

## 2015-10-30 DIAGNOSIS — I712 Thoracic aortic aneurysm, without rupture, unspecified: Secondary | ICD-10-CM

## 2015-10-30 DIAGNOSIS — I351 Nonrheumatic aortic (valve) insufficiency: Secondary | ICD-10-CM

## 2015-10-30 DIAGNOSIS — I429 Cardiomyopathy, unspecified: Secondary | ICD-10-CM | POA: Diagnosis not present

## 2015-10-30 DIAGNOSIS — I428 Other cardiomyopathies: Secondary | ICD-10-CM

## 2015-10-30 MED ORDER — METOPROLOL SUCCINATE ER 50 MG PO TB24
50.0000 mg | ORAL_TABLET | Freq: Every day | ORAL | Status: DC
Start: 2015-10-30 — End: 2016-05-05

## 2015-10-30 MED ORDER — LISINOPRIL 10 MG PO TABS
10.0000 mg | ORAL_TABLET | Freq: Every day | ORAL | Status: DC
Start: 2015-10-30 — End: 2015-10-30

## 2015-10-30 MED ORDER — METOPROLOL SUCCINATE ER 50 MG PO TB24: 50.0000 mg | ORAL_TABLET | Freq: Every day | ORAL | Status: DC

## 2015-10-30 MED ORDER — LISINOPRIL 10 MG PO TABS: 10.0000 mg | ORAL_TABLET | Freq: Every day | ORAL | Status: DC

## 2015-10-30 NOTE — Patient Instructions (Addendum)
Medication Instructions:  Your physician has recommended you make the following change in your medication:  1. INCREASE Metoprolol Succinate to 50mg  take one tablet by mouth at bedtime 2. INCREASE Lisinopril to 10mg  one tablet by mouth daily  Labwork: No new orders.   Testing/Procedures: Dr [name] has recommended a repeat GATED Cardiac CTA of chest (Dr Llana Aliment to read) in 1 YEAR.   Follow-Up: Your physician wants you to follow-up in: 1 YEAR with Dr [name].  You will receive a reminder letter in the mail two months in advance. If you don't receive a letter, please call our office to schedule the follow-up appointment.   Any Other Special Instructions Will Be Listed Below (If Applicable).     If you need a refill on your cardiac medications before your next appointment, please call your pharmacy.

## 2015-10-30 NOTE — Progress Notes (Signed)
Cardiology Office Note Date:  11/01/2015   ID:  Alan [name], DOB 06/04/1940, MRN [id number]  PCP:  [name], MD  Cardiologist:  [name], MD    Chief Complaint  Patient presents with  . Non-Ischemic Cardiomyopathy  . aortic insufficiency     History of Present Illness: Alan [name] is a 75 y.o. male who presents for follow-up evaluation. He is followed for nonischemic cardiomyopathy and thoracic aortic aneurysm. Other problems include diabetes and hypertension.   The patient is here with his wife. He recently underwent a surveillance CTA of the chest. He is doing well from a symptomatic perspective. Today, he denies symptoms of palpitations, chest pain, shortness of breath, orthopnea, PND, lower extremity edema, dizziness, or syncope.    Past Medical History  Diagnosis Date  . Malfunction of penile prosthesis (HCC)   . SUI (stress urinary incontinence), male   . ED (erectile dysfunction)   . Diabetes mellitus ORAL MED  . Arthritis   . Nocturia   . Malfunction of artificial urethral sphincter (HCC)   . Glaucoma of right eye   . History of prostate cancer     Past Surgical History  Procedure Laterality Date  . I & d/ orif right index finger  01-19-2005  . Removal and replacement gu sphinter cuff  05-15-2004    STRESS URINARY INCONTINENCE  . Placement artificial urinary sphinter  01-20-2001    [id number]  . Insertion inflatable penile prosthesis  06-22-1999    MENTOR ALPHA I  . Penile prosthesis implant  09/24/2011    Procedure: PENILE PROTHESIS INFLATABLE;  Surgeon: [name], MD;  Location: [facility];  Service: Urology;  Laterality: N/A;  REPLACEMENT OF INFLATABLE PENILE PROTHESIS (COLOPLAST)    . Left heart catheterization with coronary angiogram N/A 03/15/2013    Procedure: LEFT HEART CATHETERIZATION WITH CORONARY ANGIOGRAM;  Surgeon: [name], MD;  Location: [facility] CATH LAB;  Service: Cardiovascular;   Laterality: N/A;    Current Outpatient Prescriptions  Medication Sig Dispense Refill  . aspirin 81 MG tablet Take 81 mg by mouth daily.    Marland Kitchen b complex vitamins tablet Take 2 tablets by mouth daily. .    . Cholecalciferol (VITAMIN D) 2000 UNITS CAPS Take 1 capsule by mouth daily.     Marland Kitchen docusate sodium (COLACE) 100 MG capsule Take 100 mg by mouth as needed for mild constipation.    . dorzolamide (TRUSOPT) 2 % ophthalmic solution Place 1 drop into both eyes daily.    Marland Kitchen glipiZIDE (GLUCOTROL) 5 MG tablet Take 7.5 mg by mouth daily. Take 1/2 tablet in the morning and 1 tablet in the evening    . latanoprost (XALATAN) 0.005 % ophthalmic solution Place 1 drop into both eyes at bedtime.     Marland Kitchen lisinopril (PRINIVIL,ZESTRIL) 10 MG tablet Take 1 tablet (10 mg total) by mouth daily. 90 tablet 3  . meclizine (ANTIVERT) 25 MG tablet Take 25 mg by mouth 2 (two) times daily.    . metFORMIN (GLUCOPHAGE) 500 MG tablet Take 500 mg by mouth 2 (two) times daily with a meal.    . metoprolol succinate (TOPROL-XL) 50 MG 24 hr tablet Take 1 tablet (50 mg total) by mouth at bedtime. 90 tablet 3   No current facility-administered medications for this visit.    Allergies:   Avandia; Metformin; and Metformin and related   Social History:  The patient  reports that he has never smoked. He has never used smokeless  tobacco. He reports that he does not drink alcohol or use illicit drugs.   Family History:  The patient's  family history includes Healthy in his sister and sister; Heart Problems in his mother; Heart attack in his father.    ROS:  Please see the history of present illness.  Otherwise, review of systems is positive for anxiety.  All other systems are reviewed and negative.    PHYSICAL EXAM: VS:  BP 144/60 mmHg  Pulse 70  Ht 5\' 9"  (1.753 m)  Wt 235 lb (106.595 kg)  BMI 34.69 kg/m2 , BMI Body mass index is 34.69 kg/(m^2). GEN: Well nourished, well developed, in no acute distress HEENT: normal Neck:  no JVD, no masses. No carotid bruits Cardiac: RRR with 2/6 systolic murmur best heard at the apex             Respiratory:  clear to auscultation bilaterally, normal work of breathing GI: soft, nontender, nondistended, + BS MS: no deformity or atrophy Ext: no pretibial edema, pedal pulses 2+= bilaterally Skin: warm and dry, no rash Neuro:  Strength and sensation are intact Psych: euthymic mood, full affect  EKG:  EKG is ordered today. The ekg ordered today shows NSR with RBBB, left axis, HR 70 bpm  Recent Labs: 09/17/2015: BUN 19; Creat 1.12; Potassium 4.3; Sodium 140   Lipid Panel  No results found for: CHOL, TRIG, HDL, CHOLHDL, VLDL, LDLCALC, LDLDIRECT    Wt Readings from Last 3 Encounters:  10/30/15 235 lb (106.595 kg)  07/06/15 231 lb (104.781 kg)  02/06/15 231 lb 12.8 oz (105.144 kg)     Cardiac Studies Reviewed: Echo 10-30-2014: Study Conclusions  - Left ventricle: Diffuse hypokinesis worse in the inferior base.  The cavity size was moderately dilated. Wall thickness was  normal. The estimated ejection fraction was 40%. Doppler  parameters are consistent with abnormal left ventricular  relaxation (grade 1 diastolic dysfunction). - Aortic valve: There was mild regurgitation. Valve area (VTI):  3.88 cm^2. Valve area (Vmean): 3.84 cm^2. - Aorta: No change since echo done 2014. - Mitral valve: There was mild regurgitation. - Left atrium: The atrium was moderately dilated. - Atrial septum: No defect or patent foramen ovale was identified.  CTA Chest: IMPRESSION: 1. Aortic atherosclerosis, including ascending thoracic aortic aneurysm measuring 5.0 cm in diameter, which is stable in size compared to the prior examination from 10/18/2014. Aortic root is again mildly dilated at the level of the sinuses of Valsalva (4.3 cm). Measurements at the aortic annulus, sinuses of Valsalva and sino-tubular junction all appear slightly increased compared to the prior study,  however, this is likely related to a greater amount of motion related artifact on the prior study than true interval dilatation. Recommend semi-annual imaging followup by CTA or MRA and referral to cardiothoracic surgery if not already obtained. This recommendation follows 2010  ASSESSMENT AND PLAN: 1.  Ascending aortic aneurysm: CTA reviewed with stable dimension. No hx bicuspid aortic valve or connective tissue disease. Will continue to follow. Repeat CTA one year as dimensions stable over time.  2. HTN: increase Toprol to 50 mg HS and lisinopril to 10 mg daily. He is anxious today, suspect that's contributing to BP elevation.  3. Nonischemic cardiomyopathy: update echo, med changes as above.  4. 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 Update echo.  Current medicines are reviewed with the patient today.  The patient does not have concerns regarding medicines.  Labs/ tests ordered today include:   Orders Placed This Encounter  Procedures  . EKG 12-Lead    Disposition:   FU one year with CTA at that time.  Enzo Bi, MD  11/01/2015 5:56 AM    [facility] [address] [place], [place], [place]  [address] Phone: [phone]; Fax: [phone]

## 2016-02-10 ENCOUNTER — Encounter: Payer: Self-pay | Admitting: Podiatry

## 2016-02-10 ENCOUNTER — Ambulatory Visit (INDEPENDENT_AMBULATORY_CARE_PROVIDER_SITE_OTHER): Payer: Medicare Other | Admitting: Podiatry

## 2016-02-10 VITALS — BP 145/58 | HR 61

## 2016-02-10 DIAGNOSIS — L600 Ingrowing nail: Secondary | ICD-10-CM

## 2016-02-10 DIAGNOSIS — B351 Tinea unguium: Secondary | ICD-10-CM | POA: Diagnosis not present

## 2016-02-10 NOTE — Progress Notes (Signed)
SUBJECTIVE: 75 y.o. year old male presents requesting toe nails trimmed. He was diagnosed with diabetic sine late 1990. His A1C stays around 6. Patient is ambulatory without assistance. Stated that he came here 10 years ago for ingrown nail surgery on right great toe.   REVIEW OF SYSTEMS: Pertinent items noted in HPI and remainder of comprehensive ROS otherwise negative.  OBJECTIVE: DERMATOLOGIC EXAMINATION: Mycotic nails both great toes. Both great toes on medial border is ingrown without inflammation. Lateral border right great toe appears to have had ingrown nail surgery in past.   VASCULAR EXAMINATION OF LOWER LIMBS: Pedal pulses: All pedal pulses are palpable with normal pulsation. Capillary Filling times within 3 seconds in all digits.  No edema or erythema in lower limbs. Temperature gradient from tibial crest to dorsum of foot is within normal bilateral.  NEUROLOGIC EXAMINATION OF THE LOWER LIMBS: All epicritic and tactile sensations are grossly intact.  MUSCULOSKELETAL EXAMINATION: No gross deformities noted.  ASSESSMENT: Mycotic nails both great toes. Ingrown hallucal nails without inflammation.  PLAN: Reviewed findings and all nails debrided.

## 2016-02-10 NOTE — Patient Instructions (Signed)
Seen for hypertrophic nails. All nails debrided. Return in 3 months or as needed.  

## 2016-02-16 ENCOUNTER — Emergency Department (HOSPITAL_BASED_OUTPATIENT_CLINIC_OR_DEPARTMENT_OTHER): Payer: Medicare Other

## 2016-02-16 ENCOUNTER — Encounter (HOSPITAL_BASED_OUTPATIENT_CLINIC_OR_DEPARTMENT_OTHER): Payer: Self-pay | Admitting: Respiratory Therapy

## 2016-02-16 ENCOUNTER — Emergency Department (HOSPITAL_BASED_OUTPATIENT_CLINIC_OR_DEPARTMENT_OTHER)
Admission: EM | Admit: 2016-02-16 | Discharge: 2016-02-16 | Disposition: A | Discharge status: Home / Self Care | Payer: Medicare Other | Attending: Emergency Medicine | Admitting: Emergency Medicine

## 2016-02-16 DIAGNOSIS — F5101 Primary insomnia: Secondary | ICD-10-CM | POA: Insufficient documentation

## 2016-02-16 DIAGNOSIS — Z7982 Long term (current) use of aspirin: Secondary | ICD-10-CM | POA: Diagnosis not present

## 2016-02-16 DIAGNOSIS — E119 Type 2 diabetes mellitus without complications: Secondary | ICD-10-CM | POA: Insufficient documentation

## 2016-02-16 DIAGNOSIS — Z7984 Long term (current) use of oral hypoglycemic drugs: Secondary | ICD-10-CM | POA: Diagnosis not present

## 2016-02-16 DIAGNOSIS — Z79899 Other long term (current) drug therapy: Secondary | ICD-10-CM | POA: Insufficient documentation

## 2016-02-16 DIAGNOSIS — K5900 Constipation, unspecified: Secondary | ICD-10-CM | POA: Diagnosis not present

## 2016-02-16 DIAGNOSIS — G4700 Insomnia, unspecified: Secondary | ICD-10-CM | POA: Diagnosis present

## 2016-02-16 LAB — URINALYSIS, ROUTINE W REFLEX MICROSCOPIC
BILIRUBIN URINE: NEGATIVE
GLUCOSE, UA: NEGATIVE mg/dL
HGB URINE DIPSTICK: NEGATIVE
KETONES UR: NEGATIVE mg/dL
Leukocytes, UA: NEGATIVE
Nitrite: NEGATIVE
PROTEIN: NEGATIVE mg/dL
Specific Gravity, Urine: 1.02 (ref 1.005–1.030)
pH: 5 (ref 5.0–8.0)

## 2016-02-16 LAB — COMPREHENSIVE METABOLIC PANEL
ALBUMIN: 4.1 g/dL (ref 3.5–5.0)
ALK PHOS: 51 U/L (ref 38–126)
ALT: 17 U/L (ref 17–63)
AST: 16 U/L (ref 15–41)
Anion gap: 8 (ref 5–15)
BILIRUBIN TOTAL: 0.7 mg/dL (ref 0.3–1.2)
BUN: 18 mg/dL (ref 6–20)
CALCIUM: 9.3 mg/dL (ref 8.9–10.3)
CO2: 22 mmol/L (ref 22–32)
CREATININE: 1.05 mg/dL (ref 0.61–1.24)
Chloride: 105 mmol/L (ref 101–111)
GFR calc Af Amer: >60 mL/min (ref >60)
GLUCOSE: 153 mg/dL — AB (ref 65–99)
Potassium: 4.3 mmol/L (ref 3.5–5.1)
Sodium: 135 mmol/L (ref 135–145)
TOTAL PROTEIN: 7.2 g/dL (ref 6.5–8.1)

## 2016-02-16 LAB — CBC WITH DIFFERENTIAL/PLATELET
BASOS ABS: 0 10*3/uL (ref 0.0–0.1)
BASOS PCT: 0 %
Eosinophils Absolute: 0.2 10*3/uL (ref 0.0–0.7)
Eosinophils Relative: 2 %
HEMATOCRIT: 38.9 % — AB (ref 39.0–52.0)
HEMOGLOBIN: 13.5 g/dL (ref 13.0–17.0)
LYMPHS PCT: 19 %
Lymphs Abs: 1.9 10*3/uL (ref 0.7–4.0)
MCH: 28.9 pg (ref 26.0–34.0)
MCHC: 34.7 g/dL (ref 30.0–36.0)
MCV: 83.3 fL (ref 78.0–100.0)
MONO ABS: 1.2 10*3/uL — AB (ref 0.1–1.0)
MONOS PCT: 12 %
NEUTROS ABS: 6.5 10*3/uL (ref 1.7–7.7)
NEUTROS PCT: 67 %
Platelets: 183 10*3/uL (ref 150–400)
RBC: 4.67 MIL/uL (ref 4.22–5.81)
RDW: 14.1 % (ref 11.5–15.5)
WBC: 9.7 10*3/uL (ref 4.0–10.5)

## 2016-02-16 LAB — CBG MONITORING, ED: GLUCOSE-CAPILLARY: 155 mg/dL — AB (ref 65–99)

## 2016-02-16 NOTE — ED Triage Notes (Signed)
Pt states he started on new heart medication about 6 months ago, and his appetite has been decreasing progressively; he's been unable to eat anything for the past 3 days; patient denies N/V/D; patient does not have medication name, states that he takes for aneurysm on the aorta

## 2016-02-16 NOTE — ED Notes (Signed)
Patient is in good condition, is ambulatory, discharge instructions reviewed, follow up care reviewed, home care instructions reviewed, patient verbalized understanding, going home by himself

## 2016-02-16 NOTE — ED Provider Notes (Signed)
[facility] Provider Note   CSN: [id number] Arrival date & time: 02/16/16  0341     History   Chief Complaint Chief Complaint  Patient presents with  . Eating Disorder    HPI Alan [name] is a 75 y.o. male.  The history is provided by the patient.  Insomnia  This is a chronic problem. The current episode started more than 1 week ago (6 months ago). The problem occurs constantly. The problem has not changed since onset.Pertinent negatives include no chest pain, no abdominal pain, no headaches and no shortness of breath. Exacerbated by: a medication he started 6 months ago but does not know the name of. Nothing relieves the symptoms. He has tried nothing for the symptoms. The treatment provided no relief.  Also complains of not wanting to eat for that period of time.  States it is related to a medication started at the [place] but does not know the name.  Has an appointment at the [facility] tomorrow.  No CP, no DOE no SOB no back pain no abdominal pain.  Having daily BM on colace.  No vomiting.  No early satiety.  No jaundice.  No weakness nor numbness.  Despite patient reporting he cannot eat for six months he ate a banana just before coming to the ED and states he slept earlier tonight.  He denies food causing him pain or any bloating denies early satiety denies diarrhea or constipation.    Past Medical History:  Diagnosis Date  . Arthritis   . Diabetes mellitus ORAL MED  . ED (erectile dysfunction)   . Glaucoma of right eye   . History of prostate cancer   . Malfunction of artificial urethral sphincter (HCC)   . Malfunction of penile prosthesis (HCC)   . Nocturia   . SUI (stress urinary incontinence), male     Patient Active Problem List   Diagnosis Date Noted  . NICM (nonischemic cardiomyopathy) (HCC) 02/06/2015  . Thoracic aortic aneurysm without rupture (HCC) 02/06/2015  . Aortic insufficiency 02/06/2015  . Hyperlipidemia 02/06/2015  . Erosion of penile  prosthesis (HCC) 01/06/2012  . Malfunction of penile prosthesis (HCC) 09/24/2011    Past Surgical History:  Procedure Laterality Date  . I & D/ ORIF RIGHT INDEX FINGER  01-19-2005  . INSERTION INFLATABLE PENILE PROSTHESIS  06-22-1999   MENTOR ALPHA I  . LEFT HEART CATHETERIZATION WITH CORONARY ANGIOGRAM N/A 03/15/2013   Procedure: LEFT HEART CATHETERIZATION WITH CORONARY ANGIOGRAM;  Surgeon: [name], MD;  Location: [facility] CATH LAB;  Service: Cardiovascular;  Laterality: N/A;  . PENILE PROSTHESIS IMPLANT  09/24/2011   Procedure: PENILE PROTHESIS INFLATABLE;  Surgeon: [name], MD;  Location: [facility];  Service: Urology;  Laterality: N/A;  REPLACEMENT OF INFLATABLE PENILE PROTHESIS (COLOPLAST)    . PLACEMENT ARTIFICIAL URINARY SPHINTER  01-20-2001   [id number]  . REMOVAL AND REPLACEMENT GU SPHINTER CUFF  05-15-2004   STRESS URINARY INCONTINENCE       Home Medications    Prior to Admission medications   Medication Sig Start Date End Date Taking? Authorizing Provider  aspirin 81 MG tablet Take 81 mg by mouth daily.    Historical Provider, MD  b complex vitamins tablet Take 2 tablets by mouth daily. 500mg .    Historical Provider, MD  Cholecalciferol (VITAMIN D) 2000 UNITS CAPS Take 1 capsule by mouth daily.     Historical Provider, MD  docusate sodium (COLACE) 100 MG capsule Take 100 mg by mouth  as needed for mild constipation.    Historical Provider, MD  dorzolamide (TRUSOPT) 2 % ophthalmic solution Place 1 drop into both eyes daily.    Historical Provider, MD  dorzolamide-timolol (COSOPT) 22.3-6.8 MG/ML ophthalmic solution  11/24/15   Historical Provider, MD  fluorouracil (EFUDEX) 5 % cream  01/09/16   Historical Provider, MD  glipiZIDE (GLUCOTROL) 5 MG tablet Take 7.5 mg by mouth daily. Take 1/2 tablet in the morning and 1 tablet in the evening    Historical Provider, MD  latanoprost (XALATAN) 0.005 % ophthalmic solution Place 1 drop into both eyes at bedtime.      Historical Provider, MD  lisinopril (PRINIVIL,ZESTRIL) 10 MG tablet Take 1 tablet (10 mg total) by mouth daily. 10/30/15   Tonny BollmanMichael Cooper, MD  meclizine (ANTIVERT) 25 MG tablet Take 25 mg by mouth 2 (two) times daily.    Historical Provider, MD  metFORMIN (GLUCOPHAGE) 500 MG tablet Take 500 mg by mouth 2 (two) times daily with a meal.    Historical Provider, MD  metoprolol succinate (TOPROL-XL) 50 MG 24 hr tablet Take 1 tablet (50 mg total) by mouth at bedtime. 10/30/15   Tonny BollmanMichael Cooper, MD    Family History Family History  Problem Relation Age of Onset  . Heart Problems Mother   . Heart attack Father   . Healthy Sister     NO HEART ISSUES  . Healthy Sister     NO HEART ISSUES    Social History Social History  Substance Use Topics  . Smoking status: Never Smoker  . Smokeless tobacco: Never Used  . Alcohol use No     Allergies   Avandia [rosiglitazone]; Metformin; and Metformin and related   Review of Systems Review of Systems  Constitutional: Positive for appetite change. Negative for activity change, chills, diaphoresis, fatigue and fever.  HENT: Negative for congestion, drooling and sore throat.   Respiratory: Negative for cough, choking, chest tightness, shortness of breath, wheezing and stridor.   Cardiovascular: Negative for chest pain, palpitations and leg swelling.  Gastrointestinal: Negative for abdominal pain, anal bleeding, blood in stool, constipation, diarrhea, nausea, rectal pain and vomiting.  Genitourinary: Negative for dysuria.  Musculoskeletal: Negative for gait problem.  Skin: Negative for color change, pallor, rash and wound.  Neurological: Negative for speech difficulty, weakness, numbness and headaches.  Hematological: Negative for adenopathy.  Psychiatric/Behavioral: Positive for sleep disturbance. Negative for decreased concentration. The patient has insomnia.   All other systems reviewed and are negative.    Physical Exam Updated Vital  Signs BP 130/63 (BP Location: Left Arm)   Pulse 69   Temp 97.7 F (36.5 C) (Oral)   Resp 18   Ht 5\' 10"  (1.778 m)   Wt 229 lb (103.9 kg)   SpO2 97%   BMI 32.86 kg/m   Physical Exam  Constitutional: He is oriented to person, place, and time. He appears well-developed and well-nourished. No distress.  HENT:  Head: Normocephalic and atraumatic.  Right Ear: External ear normal.  Left Ear: External ear normal.  Mouth/Throat: Oropharynx is clear and moist. No oropharyngeal exudate.  Eyes: Conjunctivae and EOM are normal. Pupils are equal, round, and reactive to light.  Neck: Normal range of motion. Neck supple. No JVD present. No tracheal deviation present.  Cardiovascular: Normal rate, regular rhythm, normal heart sounds and intact distal pulses.   Pulmonary/Chest: Effort normal and breath sounds normal. No stridor. No respiratory distress. He has no wheezes. He has no rales.  Abdominal: Soft. Bowel sounds  are normal. He exhibits no mass. There is no tenderness. There is no rebound and no guarding.  Musculoskeletal: Normal range of motion. He exhibits no edema.  Lymphadenopathy:    He has no cervical adenopathy.  Neurological: He is alert and oriented to person, place, and time. He has normal reflexes.  Skin: Skin is warm and dry. Capillary refill takes less than 2 seconds. No rash noted. No pallor.  Psychiatric: Thought content normal.     ED Treatments / Results   Vitals:   02/16/16 0347  BP: 130/63  Pulse: 69  Resp: 18  Temp: 97.7 F (36.5 C)   Results for orders placed or performed during the hospital encounter of 02/16/16  CBC with Differential/Platelet  Result Value Ref Range   WBC 9.7 4.0 - 10.5 K/uL   RBC 4.67 4.22 - 5.81 MIL/uL   Hemoglobin 13.5 13.0 - 17.0 g/dL   HCT 16.1 (L) 09.6 - 04.5 %   MCV 83.3 78.0 - 100.0 fL   MCH 28.9 26.0 - 34.0 pg   MCHC 34.7 30.0 - 36.0 g/dL   RDW 40.9 81.1 - 91.4 %   Platelets 183 150 - 400 K/uL   Neutrophils Relative % 67 %    Neutro Abs 6.5 1.7 - 7.7 K/uL   Lymphocytes Relative 19 %   Lymphs Abs 1.9 0.7 - 4.0 K/uL   Monocytes Relative 12 %   Monocytes Absolute 1.2 (H) 0.1 - 1.0 K/uL   Eosinophils Relative 2 %   Eosinophils Absolute 0.2 0.0 - 0.7 K/uL   Basophils Relative 0 %   Basophils Absolute 0.0 0.0 - 0.1 K/uL  Comprehensive metabolic panel  Result Value Ref Range   Sodium 135 135 - 145 mmol/L   Potassium 4.3 3.5 - 5.1 mmol/L   Chloride 105 101 - 111 mmol/L   CO2 22 22 - 32 mmol/L   Glucose, Bld 153 (H) 65 - 99 mg/dL   BUN 18 6 - 20 mg/dL   Creatinine, Ser 7.82 0.61 - 1.24 mg/dL   Calcium 9.3 8.9 - 95.6 mg/dL   Total Protein 7.2 6.5 - 8.1 g/dL   Albumin 4.1 3.5 - 5.0 g/dL   AST 16 15 - 41 U/L   ALT 17 17 - 63 U/L   Alkaline Phosphatase 51 38 - 126 U/L   Total Bilirubin 0.7 0.3 - 1.2 mg/dL   GFR calc non Af Amer >60 >60 mL/min   GFR calc Af Amer >60 >60 mL/min   Anion gap 8 5 - 15  Urinalysis, Routine w reflex microscopic (not at [facility]  Result Value Ref Range   Color, Urine YELLOW YELLOW   APPearance CLEAR CLEAR   Specific Gravity, Urine 1.020 1.005 - 1.030   pH 5.0 5.0 - 8.0   Glucose, UA NEGATIVE NEGATIVE mg/dL   Hgb urine dipstick NEGATIVE NEGATIVE   Bilirubin Urine NEGATIVE NEGATIVE   Ketones, ur NEGATIVE NEGATIVE mg/dL   Protein, ur NEGATIVE NEGATIVE mg/dL   Nitrite NEGATIVE NEGATIVE   Leukocytes, UA NEGATIVE NEGATIVE  POC CBG, ED  Result Value Ref Range   Glucose-Capillary 155 (H) 65 - 99 mg/dL   No results found.  With 2 nurses present EDP immediately set expectations that we would do screening labs and an xray to excluded life and limb threatening illnesses, however given the time course of events, it is unlikely that we will be able to find a cause for patient's ongoing issues with sleep and lack of appetite in the  ED during this visit.  I immediately instructed him to keep his appointment with his PMD at the [facility] tomorrow as he will likely need a much more thorough work up with  vitamin levels, thyroid testing etc than we can do in the ED at 4 am.  I suspect this is depression given the 2 complaints though patient denies this.  Moreover, appetite is not as poor as the patient reports as he has only lost 2 lbs in 10 months which is not significant and is not in keeping with the patient's report that he has not eaten in months.  Moreover the patient appears well hydrated on exam and has good skin turgor and does not have wasting of the thenar eminences nor of the temporal areas and appears quite robust.  Without access to the medications in question it is hard to say this is a factor.  No starvation ketones in the urine.  Normal urine specific gravity.  No obstruction on XR, actually there is quite a bit of constipation suggesting the patient is eating well.  The other interesting thing is that this patient has been seen by his [facility] cardiologist in this period of time and did not mention these symptoms to him.    Procedures Procedures (including critical care time)   Final Clinical Impressions(s) / ED Diagnoses   All questions answered to patient's parents satisfaction. Based on history and exam patient has been appropriately medically screened and emergency conditions excluded. Patient is stable for discharge at this time. Follow up with your PMD for recheck in 2 days and strict return precautions given.   Cy Blamer, MD 02/16/16 [phone]

## 2016-02-27 ENCOUNTER — Telehealth: Payer: Self-pay | Admitting: Cardiovascular Disease

## 2016-02-27 NOTE — Telephone Encounter (Signed)
New message      Pt c/o medication issue:  1. Name of Medication: ropinirole 2. How are you currently taking this medication (dosage and times per day)? 0.25mg . Take 1 tab tid x7days, then 2 tab tid x7 days, then 4 tab tid x7 days 3. Are you having a reaction (difficulty breathing--STAT)? no  4. What is your medication issue? Pt states he was prescribed this medication for sleep.  Is it ok to take with his heart meds?

## 2016-02-27 NOTE — Telephone Encounter (Signed)
I will forward this message to [facility] Pharm-[facility] to review the pt's medications for possible interaction. I will contact the pt once chart is reviewed.

## 2016-02-27 NOTE — Telephone Encounter (Signed)
Called patient because of a few concerns - ropinirole is approved for RLS or Parkinson's, not sleep. The dosing that he was started on is a higher dose only approved for Parkinson's.  Patient states that he was started on it for RLS by the [place]. Advised pt that the dosing that they started the patient on is the dosing for Parkinson's, not RLS. This is much too high of a dose for him and is likely to cause orthostatic hypotension. The patient stated that he also looked up the medication information and was concerned about his high dose. Advised patient that he can start by taking 1/2 to 1 tablet once a day an hour before bed. Advised him to monitor his blood pressure and to follow up with the [place]. He verbalized understanding.

## 2016-04-26 DIAGNOSIS — I714 Abdominal aortic aneurysm, without rupture, unspecified: Secondary | ICD-10-CM

## 2016-04-26 DIAGNOSIS — K2289 Other specified disease of esophagus: Secondary | ICD-10-CM

## 2016-04-26 HISTORY — DX: Abdominal aortic aneurysm, without rupture, unspecified: I71.40

## 2016-04-26 HISTORY — DX: Abdominal aortic aneurysm, without rupture: I71.4

## 2016-04-26 HISTORY — DX: Other specified disease of esophagus: K22.89

## 2016-05-04 ENCOUNTER — Telehealth: Payer: Self-pay | Admitting: Cardiovascular Disease

## 2016-05-04 NOTE — Telephone Encounter (Signed)
New Message  Pt c/o of Chest Pain: STAT if CP now or developed within 24 hours  1. Are you having CP right now? During the night & this morning  2. Are you experiencing any other symptoms (ex. SOB, nausea, vomiting, sweating)? No  3. How long have you been experiencing CP? Last night  4. Is your CP continuous or coming and going? Continuous  5. Have you taken Nitroglycerin? No, not listed in pts chart ?

## 2016-05-04 NOTE — Telephone Encounter (Signed)
Patient called reporting inabilility to sleep for 3 days straight. Patient states that he feels like the lisinopril and metoprolol are the cause. Patient states that last night and at 4 am this morning he was experiencing chest pain and back pain. He states that his pain was 7/10, but it was more like soreness from moving around in the bed trying to sleep. Patient has also been experiencing a decreased appetite. At this current time the patient denies chest pain, back pain, shortness of breath, lightheadedness, difficulty swallowing, or any arm or jaw pain. Patient states that these symptoms have been present off and on for the last 6 months since starting the lisinopril and metoprolol, and he feels like his symptoms are related to those medications and wants to stop taking them. Patient states that he does not feel like he needs an appointment at this time, but he wants to know if he can stop taking his medications. I instructed the patient to continue taking the medications for now and I would forward this message to Dr. [name] and his RN for advise and we would get back to him. I explained to the patient that if he develops a sudden increase in chest pain or back pain, shortness of breath, or any difficulty swallowing that he needs to be seen in the ED. Patient verbalized understanding and was in agreement with this plan.

## 2016-05-04 NOTE — Telephone Encounter (Signed)
Chart reviewed. He was previously on lisinopril 5 mg and Toprol XL 25 mg daily. Would be ok to go back down to those lower doses and monitor symptoms. He should log BP at home and call us back in 1-2 weeks to let us know if he is feeling better.

## 2016-05-05 MED ORDER — METOPROLOL SUCCINATE ER 25 MG PO TB24: 25.0000 mg | ORAL_TABLET | Freq: Every day | ORAL | 3 refills | Status: DC

## 2016-05-05 MED ORDER — LISINOPRIL 5 MG PO TABS: 5.0000 mg | ORAL_TABLET | Freq: Every day | ORAL | 3 refills | Status: DC

## 2016-05-05 NOTE — Telephone Encounter (Signed)
Patient was called and notified that per Dr. [name], he needs to decrease the lisinopril from 10 mg daily to 5 mg daily and to decrease the Toprol-XL from 50 mg daily to 25 mg daily. Prescriptions were changed and sent to patient's preferred pharmacy. Patient was notified to monitor his BP at home during this time and to give [place] a call back in 1-2 weeks to let us know if his symptoms are improved. Patient verbalized understanding and is in agreement with this plan.

## 2016-05-10 ENCOUNTER — Telehealth: Payer: Self-pay | Admitting: Cardiovascular Disease

## 2016-05-10 ENCOUNTER — Emergency Department (HOSPITAL_COMMUNITY): Payer: Medicare Other

## 2016-05-10 ENCOUNTER — Observation Stay (HOSPITAL_COMMUNITY)
Admission: EM | Admit: 2016-05-10 | Discharge: 2016-05-11 | Disposition: A | Discharge status: Home / Self Care | Payer: Medicare Other | Attending: Internal Medicine | Admitting: Internal Medicine

## 2016-05-10 ENCOUNTER — Encounter (HOSPITAL_COMMUNITY): Payer: Self-pay | Admitting: Emergency Medicine

## 2016-05-10 DIAGNOSIS — E119 Type 2 diabetes mellitus without complications: Secondary | ICD-10-CM | POA: Insufficient documentation

## 2016-05-10 DIAGNOSIS — R072 Precordial pain: Principal | ICD-10-CM | POA: Insufficient documentation

## 2016-05-10 DIAGNOSIS — R079 Chest pain, unspecified: Secondary | ICD-10-CM | POA: Diagnosis present

## 2016-05-10 DIAGNOSIS — I712 Thoracic aortic aneurysm, without rupture, unspecified: Secondary | ICD-10-CM

## 2016-05-10 DIAGNOSIS — H409 Unspecified glaucoma: Secondary | ICD-10-CM | POA: Insufficient documentation

## 2016-05-10 DIAGNOSIS — Z7982 Long term (current) use of aspirin: Secondary | ICD-10-CM | POA: Diagnosis not present

## 2016-05-10 DIAGNOSIS — R0789 Other chest pain: Secondary | ICD-10-CM | POA: Diagnosis present

## 2016-05-10 DIAGNOSIS — G4700 Insomnia, unspecified: Secondary | ICD-10-CM | POA: Diagnosis not present

## 2016-05-10 DIAGNOSIS — R0602 Shortness of breath: Secondary | ICD-10-CM | POA: Diagnosis not present

## 2016-05-10 DIAGNOSIS — Z7984 Long term (current) use of oral hypoglycemic drugs: Secondary | ICD-10-CM | POA: Insufficient documentation

## 2016-05-10 DIAGNOSIS — R110 Nausea: Secondary | ICD-10-CM

## 2016-05-10 DIAGNOSIS — Z79899 Other long term (current) drug therapy: Secondary | ICD-10-CM | POA: Insufficient documentation

## 2016-05-10 DIAGNOSIS — I428 Other cardiomyopathies: Secondary | ICD-10-CM | POA: Insufficient documentation

## 2016-05-10 DIAGNOSIS — E118 Type 2 diabetes mellitus with unspecified complications: Secondary | ICD-10-CM | POA: Diagnosis not present

## 2016-05-10 DIAGNOSIS — Z8546 Personal history of malignant neoplasm of prostate: Secondary | ICD-10-CM | POA: Diagnosis not present

## 2016-05-10 DIAGNOSIS — E042 Nontoxic multinodular goiter: Secondary | ICD-10-CM | POA: Insufficient documentation

## 2016-05-10 DIAGNOSIS — I200 Unstable angina: Secondary | ICD-10-CM

## 2016-05-10 LAB — I-STAT TROPONIN, ED: Troponin i, poc: 0.05 ng/mL (ref 0.00–0.08)

## 2016-05-10 LAB — BASIC METABOLIC PANEL
ANION GAP: 8 (ref 5–15)
BUN: 14 mg/dL (ref 6–20)
CHLORIDE: 109 mmol/L (ref 101–111)
CO2: 24 mmol/L (ref 22–32)
Calcium: 9.2 mg/dL (ref 8.9–10.3)
Creatinine, Ser: 1.03 mg/dL (ref 0.61–1.24)
GFR calc non Af Amer: >60 mL/min (ref >60)
GLUCOSE: 215 mg/dL — AB (ref 65–99)
Potassium: 4.5 mmol/L (ref 3.5–5.1)
Sodium: 141 mmol/L (ref 135–145)

## 2016-05-10 LAB — CBC
HCT: 41.8 % (ref 39.0–52.0)
HEMOGLOBIN: 13.7 g/dL (ref 13.0–17.0)
MCH: 28.1 pg (ref 26.0–34.0)
MCHC: 32.8 g/dL (ref 30.0–36.0)
MCV: 85.8 fL (ref 78.0–100.0)
Platelets: 169 10*3/uL (ref 150–400)
RBC: 4.87 MIL/uL (ref 4.22–5.81)
RDW: 14.4 % (ref 11.5–15.5)
WBC: 5.1 10*3/uL (ref 4.0–10.5)

## 2016-05-10 LAB — TROPONIN I: Troponin I: 0.04 ng/mL (ref <0.03)

## 2016-05-10 LAB — GLUCOSE, CAPILLARY: GLUCOSE-CAPILLARY: 252 mg/dL — AB (ref 65–99)

## 2016-05-10 MED ORDER — ASPIRIN EC 81 MG PO TBEC
81.0000 mg | DELAYED_RELEASE_TABLET | Freq: Every day | ORAL | Status: DC
  Administered 2016-05-10: 81 mg via ORAL
  Filled 2016-05-10: qty 1

## 2016-05-10 MED ORDER — IOPAMIDOL (ISOVUE-370) INJECTION 76%
INTRAVENOUS | Status: AC
  Administered 2016-05-10: 80 mL
  Filled 2016-05-10: qty 100

## 2016-05-10 MED ORDER — ENOXAPARIN SODIUM 40 MG/0.4ML ~~LOC~~ SOLN
40.0000 mg | SUBCUTANEOUS | Status: DC
  Filled 2016-05-10: qty 0.4

## 2016-05-10 MED ORDER — MECLIZINE HCL 25 MG PO TABS
25.0000 mg | ORAL_TABLET | Freq: Two times a day (BID) | ORAL | Status: DC
  Administered 2016-05-10: 25 mg via ORAL
  Filled 2016-05-10: qty 1

## 2016-05-10 MED ORDER — ASPIRIN 81 MG PO CHEW
324.0000 mg | CHEWABLE_TABLET | Freq: Once | ORAL | Status: AC
  Administered 2016-05-10: 324 mg via ORAL
  Filled 2016-05-10: qty 4

## 2016-05-10 MED ORDER — VITAMIN D 1000 UNITS PO TABS
2000.0000 [IU] | ORAL_TABLET | Freq: Every day | ORAL | Status: DC
  Administered 2016-05-10: 2000 [IU] via ORAL
  Filled 2016-05-10: qty 2

## 2016-05-10 MED ORDER — B COMPLEX PO TABS: 2.0000 | ORAL_TABLET | Freq: Every day | ORAL | Status: DC

## 2016-05-10 MED ORDER — METOPROLOL SUCCINATE ER 25 MG PO TB24
25.0000 mg | ORAL_TABLET | Freq: Every day | ORAL | Status: DC
  Administered 2016-05-10: 25 mg via ORAL
  Filled 2016-05-10: qty 1

## 2016-05-10 MED ORDER — DOCUSATE SODIUM 100 MG PO CAPS
100.0000 mg | ORAL_CAPSULE | ORAL | Status: DC | PRN
Start: 2016-05-10 — End: 2016-05-11

## 2016-05-10 MED ORDER — DORZOLAMIDE HCL 2 % OP SOLN
1.0000 [drp] | Freq: Every day | OPHTHALMIC | Status: DC
  Administered 2016-05-10: 1 [drp] via OPHTHALMIC
  Filled 2016-05-10: qty 10

## 2016-05-10 MED ORDER — B COMPLEX-C PO TABS: 2.0000 | ORAL_TABLET | Freq: Every day | ORAL | Status: DC

## 2016-05-10 MED ORDER — INSULIN ASPART 100 UNIT/ML ~~LOC~~ SOLN
0.0000 [IU] | Freq: Every day | SUBCUTANEOUS | Status: DC
  Administered 2016-05-10: 3 [IU] via SUBCUTANEOUS

## 2016-05-10 MED ORDER — ONDANSETRON HCL 4 MG/2ML IJ SOLN: 4.0000 mg | Freq: Four times a day (QID) | INTRAMUSCULAR | Status: DC | PRN

## 2016-05-10 MED ORDER — LISINOPRIL 5 MG PO TABS
5.0000 mg | ORAL_TABLET | Freq: Every day | ORAL | Status: DC
Start: 2016-05-10 — End: 2016-05-11
  Administered 2016-05-10: 5 mg via ORAL
  Filled 2016-05-10: qty 1

## 2016-05-10 MED ORDER — ACETAMINOPHEN 325 MG PO TABS: 650.0000 mg | ORAL_TABLET | ORAL | Status: DC | PRN

## 2016-05-10 MED ORDER — LATANOPROST 0.005 % OP SOLN
1.0000 [drp] | Freq: Every day | OPHTHALMIC | Status: DC
  Administered 2016-05-10: 1 [drp] via OPHTHALMIC
  Filled 2016-05-10: qty 2.5

## 2016-05-10 MED ORDER — INSULIN ASPART 100 UNIT/ML ~~LOC~~ SOLN
0.0000 [IU] | Freq: Three times a day (TID) | SUBCUTANEOUS | Status: DC
  Administered 2016-05-11: 5 [IU] via SUBCUTANEOUS

## 2016-05-10 NOTE — ED Provider Notes (Signed)
[facility]-EMERGENCY DEPT Provider Note  CSN: [id number] Arrival date & time: 05/10/16  1453  History   Chief Complaint Chief Complaint  Patient presents with  . Generalized Body Aches  . Chest Pain  . Shortness of Breath   HPI Alan [name] is a 76 y.o. male.  The history is provided by the patient and medical records. No language interpreter was used.  Illness  This is a new problem. The current episode started 6 to 12 hours ago. The problem occurs constantly. The problem has not changed since onset.Associated symptoms include chest pain and shortness of breath. Pertinent negatives include no abdominal pain and no headaches. The symptoms are aggravated by walking and exertion. The symptoms are relieved by rest.    Past Medical History:  Diagnosis Date  . Arthritis   . Diabetes mellitus ORAL MED  . ED (erectile dysfunction)   . Glaucoma of right eye   . History of prostate cancer   . Malfunction of artificial urethral sphincter (HCC)   . Malfunction of penile prosthesis (HCC)   . Nocturia   . SUI (stress urinary incontinence), male    Patient Active Problem List   Diagnosis Date Noted  . Chest pressure 05/10/2016  . Insomnia 05/10/2016  . DM (diabetes mellitus) (HCC) 05/10/2016  . NICM (nonischemic cardiomyopathy) (HCC) 02/06/2015  . Thoracic aortic aneurysm without rupture (HCC) 02/06/2015  . Aortic insufficiency 02/06/2015  . Hyperlipidemia 02/06/2015  . 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 Malfunction of penile prosthesis (HCC) 09/24/2011   Past Surgical History:  Procedure Laterality Date  . I & D/ ORIF RIGHT INDEX FINGER  01-19-2005  . INSERTION INFLATABLE PENILE PROSTHESIS  06-22-1999   MENTOR ALPHA I  . LEFT HEART CATHETERIZATION WITH CORONARY ANGIOGRAM N/A 03/15/2013   Procedure: LEFT HEART CATHETERIZATION WITH CORONARY ANGIOGRAM;  Surgeon: [name], MD;  Location: [facility] CATH LAB;  Service: Cardiovascular;  Laterality: N/A;  . PENILE  PROSTHESIS IMPLANT  09/24/2011   Procedure: PENILE PROTHESIS INFLATABLE;  Surgeon: [name], MD;  Location[facility];  Service: Urology;  Laterality: N/A;  REPLACEMENT OF INFLATABLE PENILE PROTHESIS (COLOPLAST)    . PLACEMENT ARTIFICIAL URINARY SPHINTER  01-20-2001   [id number]  . REMOVAL AND REPLACEMENT GU SPHINTER CUFF  05-15-2004   STRESS URINARY INCONTINENCE    Home Medications    Prior to Admission medications   Medication Sig Start Date End Date Taking? Authorizing Provider  aspirin 81 MG tablet Take 81 mg by mouth daily.   Yes Historical Provider, MD  b complex vitamins tablet Take 2 tablets by mouth daily. 500mg .   Yes Historical Provider, MD  Cholecalciferol (VITAMIN D) 2000 UNITS CAPS Take 1 capsule by mouth daily.    Yes Historical Provider, MD  Cholecalciferol 2000 units CAPS Take 2,000 Units by mouth daily.   Yes Historical Provider, MD  docusate sodium (COLACE) 100 MG capsule Take 100 mg by mouth as needed for mild constipation.   Yes Historical Provider, MD  dorzolamide-timolol (COSOPT) 22.3-6.8 MG/ML ophthalmic solution Place 1 drop into both eyes 2 (two) times daily.  11/24/15  Yes Historical Provider, MD  glipiZIDE (GLUCOTROL) 10 MG tablet Take 10 mg by mouth 2 (two) times daily before a meal.   Yes Historical Provider, MD  latanoprost (XALATAN) 0.005 % ophthalmic solution Place 1 drop into both eyes at bedtime.    Yes Historical Provider, MD  lisinopril (PRINIVIL,ZESTRIL) 5 MG tablet Take 1 tablet (5 mg total) by mouth  daily. 05/05/16 08/03/16 Yes [name], MD  meclizine (ANTIVERT) 25 MG tablet Take 25 mg by mouth 2 (two) times daily.   Yes Historical Provider, MD  metFORMIN (GLUCOPHAGE) 500 MG tablet Take 500 mg by mouth 2 (two) times daily with a meal.   Yes Historical Provider, MD  metoprolol succinate (TOPROL XL) 25 MG 24 hr tablet Take 1 tablet (25 mg total) by mouth daily. 05/05/16  Yes [name], MD  mirtazapine (REMERON) 15 MG tablet  Take 7.5 mg by mouth at bedtime.   Yes Historical Provider, MD  sertraline (ZOLOFT) 25 MG tablet Take 25 mg by mouth daily. 05/07/16   Historical Provider, MD   Family History Family History  Problem Relation Age of Onset  . Heart Problems Mother   . Heart attack Father   . Healthy Sister     NO HEART ISSUES  . Healthy Sister     NO HEART ISSUES   Social History Social History  Substance Use Topics  . Smoking status: Never Smoker  . Smokeless tobacco: Never Used  . Alcohol use No    Allergies   Avandia [rosiglitazone] and Metformin and related  Review of Systems Review of Systems  Constitutional: Negative for chills and fever.  Respiratory: Positive for shortness of breath.   Cardiovascular: Positive for chest pain and palpitations. Negative for leg swelling.  Gastrointestinal: Negative for abdominal pain.  Musculoskeletal: Positive for myalgias.  Neurological: Negative for headaches.  Psychiatric/Behavioral: Positive for sleep disturbance (chronic, intermittent for years).  All other systems reviewed and are negative.   Physical Exam Updated Vital Signs BP (!) 156/66 (BP Location: Left Arm)   Pulse 66   Temp 97.9 F (36.6 C) (Oral)   Resp 16   Wt 109.1 kg   SpO2 98%   BMI 34.51 kg/m   Physical Exam  Constitutional: He is oriented to person, place, and time. No distress.  Overweight elderly Caucasian male  HENT:  Head: Normocephalic and atraumatic.  Eyes: EOM are normal. Pupils are equal, round, and reactive to light.  Neck: Normal range of motion. Neck supple.  Cardiovascular: Normal rate, regular rhythm and normal heart sounds.   Mildly hypertensive  Pulmonary/Chest: Effort normal and breath sounds normal.  Borderline tachypnea but no increased work of breathing, maintaining sats in the high 90s on room air, decreased breath sounds at bases bilaterally  Abdominal: Soft. Bowel sounds are normal. He exhibits no distension. There is no tenderness.    Musculoskeletal: Normal range of motion.  Neurological: He is alert and oriented to person, place, and time.  Skin: Skin is warm and dry. Capillary refill takes less than 2 seconds. He is not diaphoretic.  Nursing note and vitals reviewed.   ED Treatments / Results  Labs (all labs ordered are listed, but only abnormal results are displayed) Labs Reviewed  BASIC METABOLIC PANEL - Abnormal; Notable for the following:       Result Value   Glucose, Bld 215 (*)    All other components within normal limits  GLUCOSE, CAPILLARY - Abnormal; Notable for the following:    Glucose-Capillary 252 (*)    All other components within normal limits  TROPONIN I - Abnormal; Notable for the following:    Troponin I 0.04 (*)    All other components within normal limits  CBC  TSH  TROPONIN I  TROPONIN I  Rosezena Sensor, ED   EKG  EKG Interpretation  Date/Time:  Monday May 10 2016 15:08:29 EST Ventricular Rate:  60 PR Interval:  222 QRS Duration: 154 QT Interval:  494 QTC Calculation: 494 R Axis:   -96 Text Interpretation:  Sinus rhythm with 1st degree A-V block Right bundle branch block Abnormal ECG Confirmed by [facility] MD, Barbara Cower [phone]) on 05/10/2016 4:38:39 PM      Radiology Dg Chest 2 View  Result Date: 05/10/2016 CLINICAL DATA:  Chest pain EXAM: CHEST  2 VIEW COMPARISON:  February 16, 2016 FINDINGS: There is mild patchy atelectasis in the left base. Lungs elsewhere are clear. Heart is borderline enlarged with pulmonary vascularity within normal limits. No adenopathy. There is degenerative change in the thoracic spine. No pneumothorax. IMPRESSION: Mild atelectasis left base. No edema or consolidation. Mild cardiac enlargement, stable. Electronically Signed   By: Bretta Bang III M.D.   On: 05/10/2016 15:32   Ct Angio Chest Aorta W And/or Wo Contrast  Result Date: 05/10/2016 CLINICAL DATA:  Chest pain radiating to the back with pressure for 6 weeks. Shortness of breath when lying  down. History of thoracic aneurysm, diabetes, prostate cancer. EXAM: CT ANGIOGRAPHY CHEST WITH CONTRAST TECHNIQUE: Multidetector CT imaging of the chest was performed using the standard protocol during bolus administration of intravenous contrast. Multiplanar CT image reconstructions and MIPs were obtained to evaluate the vascular anatomy. CONTRAST:  80 cc Isovue 370 COMPARISON:  CT chest October 23, 2015 FINDINGS: CARDIOVASCULAR: 5.1 cm ascending aortic fusiform aneurysm is unchanged. No intrinsic density on noncontrast CT. Homogeneous contrast opacification of thoracic aorta without dissection, aneurysm, luminal irregularity, periaortic fluid collections, or contrast extravasation. Mild calcific atherosclerosis. Heart size is mildly enlarged. Mild coronary artery calcification. No pericardial effusion. No central pulmonary embolism though not tailored for evaluation. MEDIASTINUM/NODES: No mediastinal mass or lymphadenopathy by CT size criteria. Subcentimeter RIGHT hilar lymph nodes. Mild bilateral upper lobe heterogeneous lung attenuation compatible with small airway disease. Mildly patulous unchanged esophagus. LUNGS/PLEURA: Tracheobronchial tree is patent, no pneumothorax. No pleural effusions, focal consolidations, or masses. Lingular atelectasis/ scarring. Stable 5 mm ground-glass nodule RIGHT lower lobe (series 7, image 81/171), 6 mm sub solid subpleural RIGHT middle lobe medial segment pulmonary nodule (series 7, image 91/171) versus artifact. MUSCULOSKELETAL:  Nonsuspicious.  16 mm RIGHT thyroid nodule . UPPER ABDOMEN: Cholelithiasis without CT findings of acute cholecystitis. Cystic RIGHT glenohumeral osteoarthrosis. T8 Schmorl's node. Mild degenerative change of thoracic spine without acute osseous process. Review of the MIP images confirms the above findings. IMPRESSION: 1. 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 Ascending thoracic aortic aneurysm. Recommend continued  semi-annual imaging followup by CTA or MRA and referral to cardiothoracic surgery if not already obtained. This recommendation follows 2010 ACCF/AHA/AATS/ACR/ASA/SCA/SCAI/SIR/STS/SVM Guidelines for the Diagnosis and Management of Patients With Thoracic Aortic Disease. Circulation. 2010; 121: W119-J478. 2. Mild cardiomegaly and mild coronary artery calcifications . 3. Upper lobe small airway disease. 4. Cholelithiasis without CT findings of acute cholecystitis. 5. **An incidental finding of potential clinical significance has been found. 16 mm RIGHT thyroid nodule, recommend follow-up thyroid sonogram on nonemergent basis. This follows ACR consensus guidelines: Managing Incidental Thyroid Nodules Detected on Imaging: White Paper of the ACR Incidental Thyroid Findings Committee. J Am Coll Radiol 2015; 12:143-150. ** Electronically Signed   By: Awilda Metro M.D.   On: 05/10/2016 17:22   Procedures Procedures (including critical care time)  Medications Ordered in ED Medications  lisinopril (PRINIVIL,ZESTRIL) tablet 5 mg (5 mg Oral Given 05/10/16 2300)  metoprolol succinate (TOPROL-XL) 24 hr tablet 25 mg (25 mg Oral Given 05/10/16 2300)  dorzolamide (TRUSOPT)  2 % ophthalmic solution 1 drop (1 drop Both Eyes Given 05/10/16 2300)  aspirin EC tablet 81 mg (81 mg Oral Given 05/10/16 2300)  docusate sodium (COLACE) capsule 100 mg (not administered)  meclizine (ANTIVERT) tablet 25 mg (25 mg Oral Given 05/10/16 2300)  cholecalciferol (VITAMIN D) tablet 2,000 Units (2,000 Units Oral Given 05/10/16 2259)  latanoprost (XALATAN) 0.005 % ophthalmic solution 1 drop (1 drop Both Eyes Given 05/10/16 2300)  acetaminophen (TYLENOL) tablet 650 mg (not administered)  ondansetron (ZOFRAN) injection 4 mg (not administered)  enoxaparin (LOVENOX) injection 40 mg (40 mg Subcutaneous Not Given 05/10/16 2301)  insulin aspart (novoLOG) injection 0-9 Units (not administered)  insulin aspart (novoLOG) injection 0-5 Units (3 Units  Subcutaneous Given 05/10/16 2257)  B-complex with vitamin C tablet 2 tablet (2 tablets Oral Not Given 05/10/16 2259)  aspirin chewable tablet 324 mg (324 mg Oral Given 05/10/16 1642)  iopamidol (ISOVUE-370) 76 % injection (80 mLs  Contrast Given 05/10/16 1651)     Initial Impression / Assessment and Plan / ED Course  I have reviewed the triage vital signs and the nursing notes.  Pertinent labs & imaging results that were available during my care of the patient were reviewed by me and considered in my medical decision making (see chart for details).  Clinical Course   76 y.o. male with above stated PMHx, HPI, and physical. History as above.  Given history, concern for ACS versus aortic dissection versus worsening of aortic aneurysm. Given ASA. EKG showing no ST elevation or depression but noting T-wave inversions in inferior leads unchanged from previous EKG reviewed from August 2016. Troponin detectable but not elevated. CT angiogram showing no evidence of thoracic aneurysm progression (still 5.1 cm) as well as coronary artery calcifications, cholelithiasis, and 6 mm right thyroid nodule. Inpatient team will follow up studies.  Laboratory and imaging results were personally reviewed by myself and used in the medical decision making of this patient's treatment and disposition.  Pt admitted to medicine for further evaluation and management of ACS rule out with continued telemetry, serial troponins, and cardiac MRI. Pt understands and agrees with the plan and has no further questions or concerns.   Pt care discussed with and followed by my attending, Dr. Marily Memos  Angelina Ok, MD Pager [phone]  Final Clinical Impressions(s) / ED Diagnoses   Final diagnoses:  Precordial chest pain  SOB (shortness of breath)  Nausea in adult  Thoracic aortic aneurysm without rupture [facility])   New Prescriptions Current Discharge Medication List       Angelina Ok, MD 05/10/16 2314    Marily Memos, MD 05/10/16 [phone]

## 2016-05-10 NOTE — ED Notes (Signed)
Attempted report x1. 

## 2016-05-10 NOTE — Telephone Encounter (Signed)
Received a call from patient he stated he has not been sleeping at night.Stated he has been having chest pain.Stated at present he feels like he has a elephant standing on his chest.Rates pain # 10.Advised he needs to call 911.Trish called and notified.

## 2016-05-10 NOTE — Telephone Encounter (Signed)
New Message   Pt c/o of Chest Pain: STAT if CP now or developed within 24 hours  1. Are you having CP right now? Yes  2. Are you experiencing any other symptoms (ex. SOB, nausea, vomiting, sweating)? SOB  3. How long have you been experiencing CP? Today  4. Is your CP continuous or coming and going? Continuous  5. Have you taken Nitroglycerin? Doesn't take Nitroglycerin ?

## 2016-05-10 NOTE — H&P (Signed)
History and Physical    Alan [name] ZOX:[id number] DOB: 02-06-1941 DOA: 05/10/2016  Referring MD/NP/PA: ER PCP: [name], MD Outpatient Specialists: Dr. [name] (cards) Patient coming from: home  Chief Complaint: chest pressure  HPI: Alan [name] is a 76 y.o. male with medical history significant of  Uncontrolled DM, insomnia and ED.  His main complaint is insomnia of which he has had multiple medications, none of which have worked.  Patient also complains of "soreness" in chest when getting out of bed in the AM after finally falling asleep.  This soreness usually goes away but today he also felt as if he had an elephant on his chest and it was difficult and painful to take a deep breath.  He has a history of an aneurysm and he was worried that this was causing problems  Labs were done in the ER and were unremarkable, CT scan was done that showed: Stable 5.1 cm ascending aortic aneurysm without acute vascular process. Ascending thoracic aortic aneurysm. Recommend continued semi-annual imaging followup by CTA or MRA and referral to cardiothoracic surgery if not already obtained.  2. Mild cardiomegaly and mild coronary artery calcifications . 3. Upper lobe small airway disease. 4. Cholelithiasis without CT findings of acute cholecystitis. 5. **An incidental finding of potential clinical significance has been found. 16 mm RIGHT thyroid nodule, recommend follow-up thyroid sonogram on nonemergent basis.   Patient has never had a stress test before but had an echo in 2016 showing: Left ventricle: Diffuse hypokinesis worse in the inferior base.   The cavity size was moderately dilated. Wall thickness was   normal. The estimated ejection fraction was 40%. Doppler   parameters are consistent with abnormal left ventricular   relaxation (grade 1 diastolic dysfunction).  No fever, no chills  Review of Systems: all systems reviewed, negative unless stated above in  HPI   Past Medical History:  Diagnosis Date  . Arthritis   . Diabetes mellitus ORAL MED  . ED (erectile dysfunction)   . Glaucoma of right eye   . History of prostate cancer   . Malfunction of artificial urethral sphincter (HCC)   . Malfunction of penile prosthesis (HCC)   . Nocturia   . SUI (stress urinary incontinence), male     Past Surgical History:  Procedure Laterality Date  . I & D/ ORIF RIGHT INDEX FINGER  01-19-2005  . INSERTION INFLATABLE PENILE PROSTHESIS  06-22-1999   MENTOR ALPHA I  . LEFT HEART CATHETERIZATION WITH CORONARY ANGIOGRAM N/A 03/15/2013   Procedure: LEFT HEART CATHETERIZATION WITH CORONARY ANGIOGRAM;  Surgeon: [name], MD;  Location: [facility] CATH LAB;  Service: Cardiovascular;  Laterality: N/A;  . PENILE PROSTHESIS IMPLANT  09/24/2011   Procedure: PENILE PROTHESIS INFLATABLE;  Surgeon: [name], MD;  Location: [facility];  Service: Urology;  Laterality: N/A;  REPLACEMENT OF INFLATABLE PENILE PROTHESIS (COLOPLAST)    . PLACEMENT ARTIFICIAL URINARY SPHINTER  01-20-2001   [id number]  . REMOVAL AND REPLACEMENT GU SPHINTER CUFF  05-15-2004   STRESS URINARY INCONTINENCE     reports that he has never smoked. He has never used smokeless tobacco. He reports that he does not drink alcohol or use drugs.  Allergies  Allergen Reactions  . Avandia [Rosiglitazone] Other (See Comments)    Caused heart issues  . Metformin Other (See Comments)    Vertigo  . Metformin And Related Other (See Comments)    Vertigo    Family History  Problem Relation Age of  Onset  . Heart Problems Mother   . Heart attack Father   . Healthy Sister     NO HEART ISSUES  . Healthy Sister     NO HEART ISSUES     Prior to Admission medications   Medication Sig Start Date End Date Taking? Authorizing Provider  aspirin 81 MG tablet Take 81 mg by mouth daily.    Historical Provider, MD  b complex vitamins tablet Take 2 tablets by mouth daily. 500mg .     Historical Provider, MD  Cholecalciferol (VITAMIN D) 2000 UNITS CAPS Take 1 capsule by mouth daily.     Historical Provider, MD  docusate sodium (COLACE) 100 MG capsule Take 100 mg by mouth as needed for mild constipation.    Historical Provider, MD  dorzolamide (TRUSOPT) 2 % ophthalmic solution Place 1 drop into both eyes daily.    Historical Provider, MD  dorzolamide-timolol (COSOPT) 22.3-6.8 MG/ML ophthalmic solution  11/24/15   Historical Provider, MD  fluorouracil (EFUDEX) 5 % cream  01/09/16   Historical Provider, MD  glipiZIDE (GLUCOTROL) 5 MG tablet Take 7.5 mg by mouth daily. Take 1/2 tablet in the morning and 1 tablet in the evening    Historical Provider, MD  latanoprost (XALATAN) 0.005 % ophthalmic solution Place 1 drop into both eyes at bedtime.     Historical Provider, MD  lisinopril (PRINIVIL,ZESTRIL) 5 MG tablet Take 1 tablet (5 mg total) by mouth daily. 05/05/16 08/03/16  [name], MD  meclizine (ANTIVERT) 25 MG tablet Take 25 mg by mouth 2 (two) times daily.    Historical Provider, MD  metFORMIN (GLUCOPHAGE) 500 MG tablet Take 500 mg by mouth 2 (two) times daily with a meal.    Historical Provider, MD  metoprolol succinate (TOPROL XL) 25 MG 24 hr tablet Take 1 tablet (25 mg total) by mouth daily. 05/05/16   [name], MD    Physical Exam: Vitals:   05/10/16 1502 05/10/16 1630 05/10/16 1730  BP: 156/56 152/58 160/63  Pulse: 61 61 62  Resp: 20 19 22   Temp: 98.6 F (37 C)    TempSrc: Oral    SpO2: 99% 98% 99%      Constitutional: NAD, calm, comfortable Vitals:   05/10/16 1502 05/10/16 1630 05/10/16 1730  BP: 156/56 152/58 160/63  Pulse: 61 61 62  Resp: 20 19 22   Temp: 98.6 F (37 C)    TempSrc: Oral    SpO2: 99% 98% 99%   Eyes: PERRL, lids and conjunctivae normal ENMT: Mucous membranes are moist. Posterior pharynx clear of any exudate or lesions.Normal dentition.  Neck: normal, supple, no masses, no thyromegaly Respiratory: clear to auscultation  bilaterally, no wheezing, no crackles. Normal respiratory effort. No accessory muscle use.  Cardiovascular: Regular rate and rhythm, no murmurs / rubs / gallops. No extremity edema. 2+ pedal pulses. No chest wall tenderness  Abdomen: no tenderness, no masses palpated. No hepatosplenomegaly. Bowel sounds positive.  Musculoskeletal: no clubbing / cyanosis. No joint deformity upper and lower extremities. Good ROM, no contractures. Normal muscle tone.  Skin: no rashes, lesions, ulcers. No induration Neurologic: CN 2-12 grossly intact. Sensation intact, DTR normal. Strength 5/5 in all 4.  Psychiatric: Normal judgment and insight. Alert and oriented x 3. Normal mood.     Labs on Admission: I have personally reviewed following labs and imaging studies  CBC:  Recent Labs Lab 05/10/16 1509  WBC 5.1  HGB 13.7  HCT 41.8  MCV 85.8  PLT 169   Basic Metabolic  Panel:  Recent Labs Lab 05/10/16 1509  NA 141  K 4.5  CL 109  CO2 24  GLUCOSE 215*  BUN 14  CREATININE 1.03  CALCIUM 9.2   GFR: CrCl cannot be calculated (Unknown ideal weight.). Liver Function Tests: No results for input(s): AST, ALT, ALKPHOS, BILITOT, PROT, ALBUMIN in the last 168 hours. No results for input(s): LIPASE, AMYLASE in the last 168 hours. No results for input(s): AMMONIA in the last 168 hours. Coagulation Profile: No results for input(s): INR, PROTIME in the last 168 hours. Cardiac Enzymes: No results for input(s): CKTOTAL, CKMB, CKMBINDEX, TROPONINI in the last 168 hours. BNP (last 3 results) No results for input(s): PROBNP in the last 8760 hours. HbA1C: No results for input(s): HGBA1C in the last 72 hours. CBG: No results for input(s): GLUCAP in the last 168 hours. Lipid Profile: No results for input(s): CHOL, HDL, LDLCALC, TRIG, CHOLHDL, LDLDIRECT in the last 72 hours. Thyroid Function Tests: No results for input(s): TSH, T4TOTAL, FREET4, T3FREE, THYROIDAB in the last 72 hours. Anemia Panel: No  results for input(s): VITAMINB12, FOLATE, FERRITIN, TIBC, IRON, RETICCTPCT in the last 72 hours. Urine analysis:    Component Value Date/Time   COLORURINE YELLOW 02/16/2016 0431   APPEARANCEUR CLEAR 02/16/2016 0431   LABSPEC 1.020 02/16/2016 0431   PHURINE 5.0 02/16/2016 0431   GLUCOSEU NEGATIVE 02/16/2016 0431   HGBUR NEGATIVE 02/16/2016 0431   BILIRUBINUR NEGATIVE 02/16/2016 0431   KETONESUR NEGATIVE 02/16/2016 0431   PROTEINUR NEGATIVE 02/16/2016 0431   NITRITE NEGATIVE 02/16/2016 0431   LEUKOCYTESUR NEGATIVE 02/16/2016 0431   Sepsis Labs: Invalid input(s): PROCALCITONIN, LACTICIDVEN No results found for this or any previous visit (from the past 240 hour(s)).   Radiological Exams on Admission: Dg Chest 2 View  Result Date: 05/10/2016 CLINICAL DATA:  Chest pain EXAM: CHEST  2 VIEW COMPARISON:  February 16, 2016 FINDINGS: There is mild patchy atelectasis in the left base. Lungs elsewhere are clear. Heart is borderline enlarged with pulmonary vascularity within normal limits. No adenopathy. There is degenerative change in the thoracic spine. No pneumothorax. IMPRESSION: Mild atelectasis left base. No edema or consolidation. Mild cardiac enlargement, stable. Electronically Signed   By: Bretta Bang III M.D.   On: 05/10/2016 15:32   Ct Angio Chest Aorta W And/or Wo Contrast  Result Date: 05/10/2016 CLINICAL DATA:  Chest pain radiating to the back with pressure for 6 weeks. Shortness of breath when lying down. History of thoracic aneurysm, diabetes, prostate cancer. EXAM: CT ANGIOGRAPHY CHEST WITH CONTRAST TECHNIQUE: Multidetector CT imaging of the chest was performed using the standard protocol during bolus administration of intravenous contrast. Multiplanar CT image reconstructions and MIPs were obtained to evaluate the vascular anatomy. CONTRAST:  80 cc Isovue 370 COMPARISON:  CT chest October 23, 2015 FINDINGS: CARDIOVASCULAR: 5.1 cm ascending aortic fusiform aneurysm is unchanged. No  intrinsic density on noncontrast CT. Homogeneous contrast opacification of thoracic aorta without dissection, aneurysm, luminal irregularity, periaortic fluid collections, or contrast extravasation. Mild calcific atherosclerosis. Heart size is mildly enlarged. Mild coronary artery calcification. No pericardial effusion. No central pulmonary embolism though not tailored for evaluation. MEDIASTINUM/NODES: No mediastinal mass or lymphadenopathy by CT size criteria. Subcentimeter RIGHT hilar lymph nodes. Mild bilateral upper lobe heterogeneous lung attenuation compatible with small airway disease. Mildly patulous unchanged esophagus. LUNGS/PLEURA: Tracheobronchial tree is patent, no pneumothorax. No pleural effusions, focal consolidations, or masses. Lingular atelectasis/ scarring. Stable 5 mm ground-glass nodule RIGHT lower lobe (series 7, image 81/171), 6 mm sub solid subpleural  RIGHT middle lobe medial segment pulmonary nodule (series 7, image 91/171) versus artifact. MUSCULOSKELETAL:  Nonsuspicious.  16 mm RIGHT thyroid nodule . UPPER ABDOMEN: Cholelithiasis without CT findings of acute cholecystitis. Cystic RIGHT glenohumeral osteoarthrosis. T8 Schmorl's node. Mild degenerative change of thoracic spine without acute osseous process. Review of the MIP images confirms the above findings. IMPRESSION: 1. 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 Ascending thoracic aortic aneurysm. Recommend continued semi-annual imaging followup by CTA or MRA and referral to cardiothoracic surgery if not already obtained. This recommendation follows 2010 ACCF/AHA/AATS/ACR/ASA/SCA/SCAI/SIR/STS/SVM Guidelines for the Diagnosis and Management of Patients With Thoracic Aortic Disease. Circulation. 2010; 121: O962-X528. 2. Mild cardiomegaly and mild coronary artery calcifications . 3. Upper lobe small airway disease. 4. Cholelithiasis without CT findings of acute cholecystitis. 5. **An incidental finding of  potential clinical significance has been found. 16 mm RIGHT thyroid nodule, recommend follow-up thyroid sonogram on nonemergent basis. This follows ACR consensus guidelines: Managing Incidental Thyroid Nodules Detected on Imaging: White Paper of the ACR Incidental Thyroid Findings Committee. J Am Coll Radiol 2015; 12:143-150. ** Electronically Signed   By: Awilda Metro M.D.   On: 05/10/2016 17:22    EKG: Independently reviewed. RBBB  Assessment/Plan Active Problems:   Chest pressure   Insomnia   DM (diabetes mellitus) (HCC)   Chest pressure -cycle CE -call cards in AM for consult and possible stress test- NPO after midnight -denied chest problems in parents to me but under family hx-- long list of cardiac issues  Uncontrolled DM -SSI -hold metformin  Insomnia -check TSH -counseled about good sleep hygiene (on computer when not able to sleep)  Thyroid nodule -outpatient follow up  Stable aortic aneurysm -yearly follow up      DVT prophylaxis: lovenox Code Status: full Family Communication: at bedside Disposition Plan: tele obs    Allina Riches Juanetta Gosling DO [facility] Pager [phone]  If 7PM-7AM, please contact night-coverage [email or web address] Password [email or web address]  05/10/2016, 6:18 PM

## 2016-05-10 NOTE — Progress Notes (Signed)
Patient received from ED and oriented to room. Tele monitoring and skin assessment completed.  Patient lying in bed, lady friend at bedside.  No needs at this time. Call light within reach. Patient aware of NPO status at midnight.

## 2016-05-10 NOTE — ED Triage Notes (Signed)
States tosses and turns all night and he wakes up with body aches this am he woke up with cp, has hx of annurysim and last time checked was last June it was a 5

## 2016-05-11 ENCOUNTER — Observation Stay (HOSPITAL_BASED_OUTPATIENT_CLINIC_OR_DEPARTMENT_OTHER): Payer: Medicare Other

## 2016-05-11 DIAGNOSIS — R079 Chest pain, unspecified: Secondary | ICD-10-CM

## 2016-05-11 DIAGNOSIS — I119 Hypertensive heart disease without heart failure: Secondary | ICD-10-CM | POA: Diagnosis not present

## 2016-05-11 DIAGNOSIS — F19982 Other psychoactive substance use, unspecified with psychoactive substance-induced sleep disorder: Secondary | ICD-10-CM | POA: Diagnosis not present

## 2016-05-11 DIAGNOSIS — H409 Unspecified glaucoma: Secondary | ICD-10-CM | POA: Diagnosis not present

## 2016-05-11 DIAGNOSIS — R0789 Other chest pain: Secondary | ICD-10-CM | POA: Diagnosis not present

## 2016-05-11 DIAGNOSIS — R072 Precordial pain: Secondary | ICD-10-CM | POA: Diagnosis not present

## 2016-05-11 DIAGNOSIS — I428 Other cardiomyopathies: Secondary | ICD-10-CM

## 2016-05-11 DIAGNOSIS — E118 Type 2 diabetes mellitus with unspecified complications: Secondary | ICD-10-CM | POA: Diagnosis not present

## 2016-05-11 DIAGNOSIS — G4700 Insomnia, unspecified: Secondary | ICD-10-CM | POA: Diagnosis not present

## 2016-05-11 DIAGNOSIS — E119 Type 2 diabetes mellitus without complications: Secondary | ICD-10-CM | POA: Diagnosis not present

## 2016-05-11 DIAGNOSIS — R0602 Shortness of breath: Secondary | ICD-10-CM | POA: Diagnosis not present

## 2016-05-11 LAB — ECHOCARDIOGRAM COMPLETE
Ao-asc: 45 cm
E decel time: 292 msec
E/e' ratio: 13.65
FS: 10 % — AB (ref 28–44)
IVS/LV PW RATIO, ED: 1.15
LA ID, A-P, ES: 46 mm
LA diam end sys: 46 mm
LA diam index: 2.04 cm/m2
LA vol A4C: 73.2 ml
LV E/e' medial: 13.65
LV E/e'average: 13.65
LV PW d: 13 mm — AB (ref 0.6–1.1)
LV e' LATERAL: 3.59 cm/s
LVOT SV: 137 mL
LVOT VTI: 23.9 cm
LVOT area: 5.73 cm2
LVOT diameter: 27 mm
LVOT peak grad rest: 5 mmHg
LVOT peak vel: 116 cm/s
Lateral S' vel: 13.4 cm/s
MV Dec: 292
MV pk A vel: 98.5 m/s
MV pk E vel: 49 m/s
P 1/2 time: 492 ms
PISA EROA: 0.29 cm2
TAPSE: 34.3 mm
TDI e' lateral: 3.59
TDI e' medial: 4.35
VTI: 198 cm
Weight: 3848 oz

## 2016-05-11 LAB — NM MYOCAR MULTI W/SPECT W/WALL MOTION / EF
CHL CUP MPHR: 146 {beats}/min
CHL CUP RESTING HR STRESS: 64 {beats}/min
CSEPED: 0 min
Estimated workload: 1 METS
Exercise duration (sec): 0 s
Peak HR: 86 {beats}/min
Percent HR: 58 %

## 2016-05-11 LAB — TROPONIN I
TROPONIN I: 0.04 ng/mL — AB (ref <0.03)
TROPONIN I: 0.05 ng/mL — AB (ref <0.03)

## 2016-05-11 LAB — TSH: TSH: 0.57 u[IU]/mL (ref 0.350–4.500)

## 2016-05-11 LAB — GLUCOSE, CAPILLARY
GLUCOSE-CAPILLARY: 277 mg/dL — AB (ref 65–99)
Glucose-Capillary: 174 mg/dL — ABNORMAL HIGH (ref 65–99)

## 2016-05-11 MED ORDER — LISINOPRIL 10 MG PO TABS: 10.0000 mg | ORAL_TABLET | Freq: Every day | ORAL | 0 refills | Status: DC

## 2016-05-11 MED ORDER — REGADENOSON 0.4 MG/5ML IV SOLN
0.4000 mg | Freq: Once | INTRAVENOUS | Status: AC
  Administered 2016-05-11: 0.4 mg via INTRAVENOUS
  Filled 2016-05-11: qty 5

## 2016-05-11 MED ORDER — REGADENOSON 0.4 MG/5ML IV SOLN
INTRAVENOUS | Status: AC
  Administered 2016-05-11: 0.4 mg via INTRAVENOUS
  Filled 2016-05-11: qty 5

## 2016-05-11 MED ORDER — METOPROLOL SUCCINATE ER 50 MG PO TB24: 50.0000 mg | ORAL_TABLET | Freq: Every day | ORAL | 0 refills | Status: DC

## 2016-05-11 MED ORDER — TECHNETIUM TC 99M TETROFOSMIN IV KIT
10.0000 | PACK | Freq: Once | INTRAVENOUS | Status: AC | PRN
  Administered 2016-05-11: 10 via INTRAVENOUS

## 2016-05-11 MED ORDER — TECHNETIUM TC 99M TETROFOSMIN IV KIT
30.0000 | PACK | Freq: Once | INTRAVENOUS | Status: AC | PRN
  Administered 2016-05-11: 30 via INTRAVENOUS

## 2016-05-11 NOTE — Consult Note (Addendum)
Cardiology Consult    Patient ID: Alan [name] MRN: [id number], DOB/AGE: 1940/10/16   Admit date: 05/10/2016 Date of Consult: 05/11/2016  Primary Physician: [name], MD Primary Cardiologist: Dr. [name] Requesting Provider: Dr. Benjamine Mola Reason for Consultation: Chest pain  Patient Profile    76 yo male with PMH of thoracic aortic aneurysm, NICM, AS, HTN, NIDDM who presented with c/o insomnia, and chest pain.   Past Medical History   Past Medical History:  Diagnosis Date  . Arthritis   . Diabetes mellitus ORAL MED  . ED (erectile dysfunction)   . Glaucoma of right eye   . History of prostate cancer   . Malfunction of artificial urethral sphincter (HCC)   . Malfunction of penile prosthesis (HCC)   . Nocturia   . SUI (stress urinary incontinence), male     Past Surgical History:  Procedure Laterality Date  . I & D/ ORIF RIGHT INDEX FINGER  01-19-2005  . INSERTION INFLATABLE PENILE PROSTHESIS  06-22-1999   MENTOR ALPHA I  . LEFT HEART CATHETERIZATION WITH CORONARY ANGIOGRAM N/A 03/15/2013   Procedure: LEFT HEART CATHETERIZATION WITH CORONARY ANGIOGRAM;  Surgeon: [name], MD;  Location: [facility] CATH LAB;  Service: Cardiovascular;  Laterality: N/A;  . PENILE PROSTHESIS IMPLANT  09/24/2011   Procedure: PENILE PROTHESIS INFLATABLE;  Surgeon: [name], MD;  Location: [facility];  Service: Urology;  Laterality: N/A;  REPLACEMENT OF INFLATABLE PENILE PROTHESIS (COLOPLAST)    . PLACEMENT ARTIFICIAL URINARY SPHINTER  01-20-2001   [id number]  . REMOVAL AND REPLACEMENT GU SPHINTER CUFF  05-15-2004   STRESS URINARY INCONTINENCE     Allergies  Allergies  Allergen Reactions  . Avandia [Rosiglitazone] Other (See Comments)    Caused heart issues  . Metformin And Related Other (See Comments)    Vertigo    History of Present Illness    Alan [name] is a 76 yo male with PMH of  thoracic aortic aneurysm, NICM, AS, HTN, NIDDM who is followed  by Dr. [name]. He underwent LHC back in 2014 that showed widely patent coronaries with segmental LV dysfunction. Last echo showed EF of 40% with diffuse hypokinesis worse in the inferior base, with G1DD.   Last saw Dr. [name] in the office on 7/17 and reported feeling well. His Toprol XL was increased to 50mg  and lisinopril increased to 10mg  daily as his blood pressure was elevated. Since this time he has been having trouble with insomnia, stating that he if falling asleep around 1130pm sleeping only about an hour then waking up. Then falls asleep around 6am and sleep a majority of the morning. His Toprol and lisinopril were decreased back to 25mg , and 5mg  after talking with Dr. [name] as the patient felt this was affecting his sleep. Reports he had gone 3 days without hardly any sleep. Presented to the ED yesterday after he woke up mid-afternoon, reports feeling sore all over, trouble getting out of bed. Then once he got up, felt a sharp pain across his chest, "in his muscles" when he tried to breathe. Symptoms persisted and he became concerned and called a friend t  Inpatient Medications    . aspirin EC  81 mg Oral QHS  . B-complex with vitamin C  2 tablet Oral QHS  . cholecalciferol  2,000 Units Oral QHS  . dorzolamide  1 drop Both Eyes Daily  . enoxaparin (LOVENOX) injection  40 mg Subcutaneous Q24H  . insulin aspart  0-5 Units Subcutaneous QHS  .  insulin aspart  0-9 Units Subcutaneous TID WC  . latanoprost  1 drop Both Eyes QHS  . lisinopril  5 mg Oral QHS  . meclizine  25 mg Oral BID  . metoprolol succinate  25 mg Oral QHS    Family History    Family History  Problem Relation Age of Onset  . Heart Problems Mother   . Heart attack Father   . Healthy Sister     NO HEART ISSUES  . Healthy Sister     NO HEART ISSUES    Social History    Social History   Social History  . Marital status: Divorced    Spouse name: N/A  . Number of children: N/A  . Years of education: N/A    Occupational History  . Not on file.   Social History Main Topics  . Smoking status: Never Smoker  . Smokeless tobacco: Never Used  . Alcohol use No  . Drug use: No  . Sexual activity: Not on file   Other Topics Concern  . Not on file   Social History Narrative  . No narrative on file     Review of Systems    General:  No chills, fever, night sweats or weight changes.  Cardiovascular:  No chest pain, dyspnea on exertion, edema, orthopnea, palpitations, paroxysmal nocturnal dyspnea. Dermatological: No rash, lesions/masses Respiratory: No cough, dyspnea Urologic: No hematuria, dysuria Abdominal:   No nausea, vomiting, diarrhea, bright red blood per rectum, melena, or hematemesis Neurologic:  No visual changes, wkns, changes in mental status. All other systems reviewed and are otherwise negative except as noted above.  Physical Exam    Blood pressure (!) 151/59, pulse 65, temperature 98.2 F (36.8 C), temperature source Oral, resp. rate 18, weight 240 lb 8 oz (109.1 kg), SpO2 97 %.  General: Pleasant, NAD Psych: Normal affect. Neuro: Alert and oriented X 3. Moves all extremities spontaneously. HEENT: Normal  Neck: Supple without bruits or JVD. Lungs:  Resp regular and unlabored, CTA. Heart: RRR no s3, s4, or murmurs. Abdomen: Soft, non-tender, non-distended, BS + x 4.  Extremities: No clubbing, cyanosis or edema. DP/PT/Radials 2+ and equal bilaterally.  Labs    Troponin [facility] of Care Test)  Recent Labs  05/10/16 1514  TROPIPOC 0.05    Recent Labs  05/10/16 2034 05/10/16 2318 05/11/16 0156  TROPONINI 0.04* 0.04* 0.05*   Lab Results  Component Value Date   WBC 5.1 05/10/2016   HGB 13.7 05/10/2016   HCT 41.8 05/10/2016   MCV 85.8 05/10/2016   PLT 169 05/10/2016    Recent Labs Lab 05/10/16 1509  NA 141  K 4.5  CL 109  CO2 24  BUN 14  CREATININE 1.03  CALCIUM 9.2  GLUCOSE 215*   No results found for: CHOL, HDL, LDLCALC, TRIG No results  found for: [facility]   Radiology Studies    Dg Chest 2 View  Result Date: 05/10/2016 CLINICAL DATA:  Chest pain EXAM: CHEST  2 VIEW COMPARISON:  February 16, 2016 FINDINGS: There is mild patchy atelectasis in the left base. Lungs elsewhere are clear. Heart is borderline enlarged with pulmonary vascularity within normal limits. No adenopathy. There is degenerative change in the thoracic spine. No pneumothorax. IMPRESSION: Mild atelectasis left base. No edema or consolidation. Mild cardiac enlargement, stable. Electronically Signed   By: Bretta Bang III M.D.   On: 05/10/2016 15:32   Ct Angio Chest Aorta W And/or Wo Contrast  Result Date: 05/10/2016 CLINICAL DATA:  Chest pain radiating to the back with pressure for 6 weeks. Shortness of breath when lying down. History of thoracic aneurysm, diabetes, prostate cancer. EXAM: CT ANGIOGRAPHY CHEST WITH CONTRAST TECHNIQUE: Multidetector CT imaging of the chest was performed using the standard protocol during bolus administration of intravenous contrast. Multiplanar CT image reconstructions and MIPs were obtained to evaluate the vascular anatomy. CONTRAST:  80 cc Isovue 370 COMPARISON:  CT chest October 23, 2015 FINDINGS: CARDIOVASCULAR: 5.1 cm ascending aortic fusiform aneurysm is unchanged. No intrinsic density on noncontrast CT. Homogeneous contrast opacification of thoracic aorta without dissection, aneurysm, luminal irregularity, periaortic fluid collections, or contrast extravasation. Mild calcific atherosclerosis. Heart size is mildly enlarged. Mild coronary artery calcification. No pericardial effusion. No central pulmonary embolism though not tailored for evaluation. MEDIASTINUM/NODES: No mediastinal mass or lymphadenopathy by CT size criteria. Subcentimeter RIGHT hilar lymph nodes. Mild bilateral upper lobe heterogeneous lung attenuation compatible with small airway disease. Mildly patulous unchanged esophagus. LUNGS/PLEURA: Tracheobronchial tree is patent,  no pneumothorax. No pleural effusions, focal consolidations, or masses. Lingular atelectasis/ scarring. Stable 5 mm ground-glass nodule RIGHT lower lobe (series 7, image 81/171), 6 mm sub solid subpleural RIGHT middle lobe medial segment pulmonary nodule (series 7, image 91/171) versus artifact. MUSCULOSKELETAL:  Nonsuspicious.  16 mm RIGHT thyroid nodule . UPPER ABDOMEN: Cholelithiasis without CT findings of acute cholecystitis. Cystic RIGHT glenohumeral osteoarthrosis. T8 Schmorl's node. Mild degenerative change of thoracic spine without acute osseous process. Review of the MIP images confirms the above findings. IMPRESSION: 1. 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 Ascending thoracic aortic aneurysm. Recommend continued semi-annual imaging followup by CTA or MRA and referral to cardiothoracic surgery if not already obtained. This recommendation follows 2010 ACCF/AHA/AATS/ACR/ASA/SCA/SCAI/SIR/STS/SVM Guidelines for the Diagnosis and Management of Patients With Thoracic Aortic Disease. Circulation. 2010; 121: G315-V761. 2. Mild cardiomegaly and mild coronary artery calcifications . 3. Upper lobe small airway disease. 4. Cholelithiasis without CT findings of acute cholecystitis. 5. **An incidental finding of potential clinical significance has been found. 16 mm RIGHT thyroid nodule, recommend follow-up thyroid sonogram on nonemergent basis. This follows ACR consensus guidelines: Managing Incidental Thyroid Nodules Detected on Imaging: White Paper of the ACR Incidental Thyroid Findings Committee. J Am Coll Radiol 2015; 12:143-150. ** Electronically Signed   By: Awilda Metro M.D.   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   The cavity size was moderately dilated. Wall thickness was   normal. The estimated  ejection fraction was 40%. Doppler   parameters are consistent with abnormal left ventricular   relaxation (grade 1 diastolic dysfunction). - Aortic valve: There was mild regurgitation. Valve area (VTI):   3.88 cm^2. Valve area (Vmean): 3.84 cm^2. - Aorta: No change since echo done 2014. - Mitral valve: There was mild regurgitation. - Left atrium: The atrium was moderately dilated. - Atrial septum: No defect or patent foramen ovale was identified.  Assessment & Plan    76 yo male with PMH of thoracic aortic aneurysm, NICM, mild AS, HTN, NIDDM who presented with c/o insomnia, and chest pain.   1. Chest pain: Symptoms are atypical in nature, he seems more concerned about the insomnia. Had a cath in 2014 that showed normal coronaries. Last echo did show a mild decrease in EF to 40%. Attempted made to increase his  cardiac medications, but he felt this was interfering with his sleep. Does have ongoing RF including HTN, and NIDDM. Reports his blood sugars have not been well controlled. CTA showed stable 5.1 cm AAA. --Given mild elevation in trop, will plan for Lexiscan myoview today to rule out ischemia.   2. Insomnia: States he has been having trouble with this for weeks now. Went almost 3 days without sleep. Does report increased personal stress at home. Question whether he has OSA as he has been told he snores. Planned to complete a sleep study with the [place], but has not been to arrange. Wants to arrange with our office.   3. HTN: Hypertensive this admission, but is somewhat resistant to increasing his antihypertensive further.   4. NIDDM: Reports his blood sugars have not been controlled. On oral medications.   Janice Coffin, NP-C Pager [phone] 05/11/2016, 9:46 AM]  The patient was seen, examined and discussed with Laverda Page, NP-C and I agree with the above.   Alan [name] is a 76 yo male with PMH of  thoracic aortic aneurysm, NICM, AS, HTN, NIDDM who is followed by Dr.  [name]. He underwent LHC back in 2014 that showed widely patent coronaries with segmental LV dysfunction. Last echo from 09/2015 showed EF of 40% with diffuse hypokinesis worse in the inferior base, with G1DD.  He was doing well in 10/2015, his BB dose has been changed couple of times as he was hypertensive but higher doses lead to insomnia.  He presented with worsening insomnia, overall not feeling well and sharp left shoulder pain while asleep and on deep inspiration. Troponin borderline x 3, ECG shows unchanged SR and RBBB and LAFB, no ischemic changes. We will schedule an exercise nuclear stress test to rule ischemia, but unlikely. We might consider different antihypertensive regimen. Repeat echocardiogram.  Tobias Alexander, MD 05/11/2016   Addendum:  The stress test is negative for ischemia, however decreased LVEF, TTE showed LVEF 30-35%, previously 40%. He is hypertensive, I talked to him about restarting higher - original doses of his BP meds - Toprol XL 50 mg po daily and lisinopril 10 mg po daily, he was also advised to stop taking B vitamins and try to avoif eating late at night and buy OTC melatonin. We will arrabge for a follow up appointment in our clinic - with the pharmacist Megan Supple, pharmD for consideration of Entresto initiation given decreased LVEF, hypertension and worsening symptoms.   Tobias Alexander, MD 05/11/2016

## 2016-05-11 NOTE — Progress Notes (Signed)
  Echocardiogram 2D Echocardiogram has been performed.  Janalyn Harder 05/11/2016, 2:15 PM

## 2016-05-11 NOTE — Discharge Summary (Signed)
Physician Discharge Summary  Alan [name] VWU:[id number] DOB: 1941/03/22 DOA: 05/10/2016  PCP: [name], MD  Admit date: 05/10/2016 Discharge date: 05/11/2016   Recommendations for Outpatient Follow-Up:   1. Sleep apnea referral 2. Thyroid U/S as an outpatient 3. Semi-annual aneurysm follow up 4. DM medication titration (patient had stopped meds briefly as he thought the were causing his insomnia)   Discharge Diagnosis:   Active Problems:   Chest pressure   Insomnia   DM (diabetes mellitus) (HCC)   Discharge disposition:  Home.   Discharge Condition: Improved.  Diet recommendation: Low sodium, heart healthy.  Carbohydrate-modified  Wound care: None.   History of Present Illness:   Alan [name] is a 76 y.o. male with medical history significant of  Uncontrolled DM, insomnia and ED.  His main complaint is insomnia of which he has had multiple medications, none of which have worked.  Patient also complains of "soreness" in chest when getting out of bed in the AM after finally falling asleep.  This soreness usually goes away but today he also felt as if he had an elephant on his chest and it was difficult and painful to take a deep breath.  He has a history of an aneurysm and he was worried that this was causing problems  Labs were done in the ER and were unremarkable, CT scan was done that showed: Stable 5.1 cm ascending aortic aneurysm without acute vascular process. Ascending thoracic aortic aneurysm. Recommend continued semi-annual imaging followup by CTA or MRA and referral to cardiothoracic surgery if not already obtained.  2. Mild cardiomegaly and mild coronary artery calcifications . 3. Upper lobe small airway disease. 4. Cholelithiasis without CT findings of acute cholecystitis. 5. **An incidental finding of potential clinical significance has been found. 16 mm RIGHT thyroid nodule, recommend follow-up thyroid sonogram on nonemergent  basis.    Hospital Course by Problem:   Chest pain -mildly elevated troponin -stress test high risk due to low EF-- seen by cardiology-- medical management for now -increase ACE and BB  Insomnia -needs outpatient OSA study -good sleep hygiene     Medical Consultants:    cards   Discharge Exam:   Vitals:   05/11/16 1129 05/11/16 1256  BP:  (!) 145/60  Pulse: 82 68  Resp:  20  Temp:  98.2 F (36.8 C)   Vitals:   05/11/16 1125 05/11/16 1127 05/11/16 1129 05/11/16 1256  BP: (!) 142/41 (!) 132/55  (!) 145/60  Pulse: 77 84 82 68  Resp:    20  Temp:    98.2 F (36.8 C)  TempSrc:    Oral  SpO2:    97%  Weight:        Gen:  NAD    The results of significant diagnostics from this hospitalization (including imaging, microbiology, ancillary and laboratory) are listed below for reference.     Procedures and Diagnostic Studies:   Dg Chest 2 View  Result Date: 05/10/2016 CLINICAL DATA:  Chest pain EXAM: CHEST  2 VIEW COMPARISON:  February 16, 2016 FINDINGS: There is mild patchy atelectasis in the left base. Lungs elsewhere are clear. Heart is borderline enlarged with pulmonary vascularity within normal limits. No adenopathy. There is degenerative change in the thoracic spine. No pneumothorax. IMPRESSION: Mild atelectasis left base. No edema or consolidation. Mild cardiac enlargement, stable. Electronically Signed   By: Bretta Bang III M.D.   On: 05/10/2016 15:32   Nm Myocar Multi W/spect W/wall Motion / Ef  Result Date: 05/11/2016 CLINICAL DATA:  76 year old male with chest pain diabetes. Coronary disease with angioplasty 2014. No ST changes on EKG stress portion. EXAM: MYOCARDIAL IMAGING WITH SPECT (REST AND PHARMACOLOGIC-STRESS) GATED LEFT VENTRICULAR WALL MOTION STUDY LEFT VENTRICULAR EJECTION FRACTION TECHNIQUE: Standard myocardial SPECT imaging was performed after resting intravenous injection of 10 mCi Tc-76m tetrofosmin. Subsequently, intravenous infusion of  Lexiscan was performed under the supervision of the Cardiology staff. At peak effect of the drug, 30 mCi Tc-7m tetrofosmin was injected intravenously and standard myocardial SPECT imaging was performed. Quantitative gated imaging was also performed to evaluate left ventricular wall motion, and estimate left ventricular ejection fraction. COMPARISON:  None available FINDINGS: Perfusion: Fixed decrease counts in the mid and basilar segment of the inferior wall. Mild decrease counts the inferolateral wall on stress and rest, slightly worse on stress. No clear reversible ischemia. Wall Motion: Global hypokinesia with more focal hypokinesia in the inferior wall. LEFT ventricle is dilated at rest and stress. Left Ventricular Ejection Fraction: 33 % End diastolic volume 327 ml End systolic volume 219 ml IMPRESSION: 1. No reversible ischemia.  LEFT ventricular dilatation. 2. Global hypokinesia most severe in inferior wall. 3. Left ventricular ejection fraction 33% 4. Non invasive risk stratification*: High (based on ejection fraction less than 35%) *2012 Appropriate Use Criteria for Coronary Revascularization Focused Update: J Am Coll Cardiol. 2012;59(9):857-881. http://content.dementiazones.com.aspx?articleid=[email or web address] Electronically Signed   By: Genevive Bi M.D.   On: 05/11/2016 13:47   Ct Angio Chest Aorta W And/or Wo Contrast  Result Date: 05/10/2016 CLINICAL DATA:  Chest pain radiating to the back with pressure for 6 weeks. Shortness of breath when lying down. History of thoracic aneurysm, diabetes, prostate cancer. EXAM: CT ANGIOGRAPHY CHEST WITH CONTRAST TECHNIQUE: Multidetector CT imaging of the chest was performed using the standard protocol during bolus administration of intravenous contrast. Multiplanar CT image reconstructions and MIPs were obtained to evaluate the vascular anatomy. CONTRAST:  80 cc Isovue 370 COMPARISON:  CT chest October 23, 2015 FINDINGS: CARDIOVASCULAR: 5.1 cm ascending aortic  fusiform aneurysm is unchanged. No intrinsic density on noncontrast CT. Homogeneous contrast opacification of thoracic aorta without dissection, aneurysm, luminal irregularity, periaortic fluid collections, or contrast extravasation. Mild calcific atherosclerosis. Heart size is mildly enlarged. Mild coronary artery calcification. No pericardial effusion. No central pulmonary embolism though not tailored for evaluation. MEDIASTINUM/NODES: No mediastinal mass or lymphadenopathy by CT size criteria. Subcentimeter RIGHT hilar lymph nodes. Mild bilateral upper lobe heterogeneous lung attenuation compatible with small airway disease. Mildly patulous unchanged esophagus. LUNGS/PLEURA: Tracheobronchial tree is patent, no pneumothorax. No pleural effusions, focal consolidations, or masses. Lingular atelectasis/ scarring. Stable 5 mm ground-glass nodule RIGHT lower lobe (series 7, image 81/171), 6 mm sub solid subpleural RIGHT middle lobe medial segment pulmonary nodule (series 7, image 91/171) versus artifact. MUSCULOSKELETAL:  Nonsuspicious.  16 mm RIGHT thyroid nodule . UPPER ABDOMEN: Cholelithiasis without CT findings of acute cholecystitis. Cystic RIGHT glenohumeral osteoarthrosis. T8 Schmorl's node. Mild degenerative change of thoracic spine without acute osseous process. Review of the MIP images confirms the above findings. IMPRESSION: 1. 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 Ascending thoracic aortic aneurysm. Recommend continued semi-annual imaging followup by CTA or MRA and referral to cardiothoracic surgery if not already obtained. This recommendation follows 2010 ACCF/AHA/AATS/ACR/ASA/SCA/SCAI/SIR/STS/SVM Guidelines for the Diagnosis and Management of Patients With Thoracic Aortic Disease. Circulation. 2010; 121: G665-L935. 2. Mild cardiomegaly and mild coronary artery calcifications . 3. Upper lobe small airway disease. 4. Cholelithiasis without CT findings of acute  cholecystitis. 5. **An incidental finding of potential clinical significance has been found. 16 mm RIGHT thyroid nodule, recommend follow-up thyroid sonogram on nonemergent basis. This follows ACR consensus guidelines: Managing Incidental Thyroid Nodules Detected on Imaging: White Paper of the ACR Incidental Thyroid Findings Committee. J Am Coll Radiol 2015; 12:143-150. ** Electronically Signed   By: Awilda Metro M.D.   On: 05/10/2016 17:22     Labs:   Basic Metabolic Panel:  Recent Labs Lab 05/10/16 1509  NA 141  K 4.5  CL 109  CO2 24  GLUCOSE 215*  BUN 14  CREATININE 1.03  CALCIUM 9.2   GFR Estimated Creatinine Clearance: 77.8 mL/min (by C-G formula based on SCr of 1.03 mg/dL). Liver Function Tests: No results for input(s): AST, ALT, ALKPHOS, BILITOT, PROT, ALBUMIN in the last 168 hours. No results for input(s): LIPASE, AMYLASE in the last 168 hours. No results for input(s): AMMONIA in the last 168 hours. Coagulation profile No results for input(s): INR, PROTIME in the last 168 hours.  CBC:  Recent Labs Lab 05/10/16 1509  WBC 5.1  HGB 13.7  HCT 41.8  MCV 85.8  PLT 169   Cardiac Enzymes:  Recent Labs Lab 05/10/16 2034 05/10/16 2318 05/11/16 0156  TROPONINI 0.04* 0.04* 0.05*   BNP: Invalid input(s): POCBNP CBG:  Recent Labs Lab 05/10/16 2020 05/11/16 0614 05/11/16 1336  GLUCAP 252* 174* 277*   D-Dimer No results for input(s): DDIMER in the last 72 hours. Hgb A1c No results for input(s): HGBA1C in the last 72 hours. Lipid Profile No results for input(s): CHOL, HDL, LDLCALC, TRIG, CHOLHDL, LDLDIRECT in the last 72 hours. Thyroid function studies  Recent Labs  05/11/16 0156  TSH 0.570   Anemia work up No results for input(s): VITAMINB12, FOLATE, FERRITIN, TIBC, IRON, RETICCTPCT in the last 72 hours. Microbiology No results found for this or any previous visit (from the past 240 hour(s)).   Discharge Instructions:   Discharge  Instructions    Diet - low sodium heart healthy    Complete by:  As directed    Diet Carb Modified    Complete by:  As directed    Discharge instructions    Complete by:  As directed    Keep log of blood sugars and bring to PCP Outpatient OSA study Sleep hygiene   Increase activity slowly    Complete by:  As directed      Allergies as of 05/11/2016      Reactions   Avandia [rosiglitazone] Other (See Comments)   Caused heart issues   Metformin And Related Other (See Comments)   Vertigo      Medication List    TAKE these medications   aspirin 81 MG tablet Take 81 mg by mouth daily.   b complex vitamins tablet Take 2 tablets by mouth daily. 500mg .   docusate sodium 100 MG capsule Commonly known as:  COLACE Take 100 mg by mouth as needed for mild constipation.   dorzolamide-timolol 22.3-6.8 MG/ML ophthalmic solution Commonly known as:  COSOPT Place 1 drop into both eyes 2 (two) times daily.   glipiZIDE 10 MG tablet Commonly known as:  GLUCOTROL Take 10 mg by mouth 2 (two) times daily before a meal.   latanoprost 0.005 % ophthalmic solution Commonly known as:  XALATAN Place 1 drop into both eyes at bedtime.   lisinopril 10 MG tablet Commonly known as:  PRINIVIL,ZESTRIL Take 1 tablet (10 mg total) by mouth daily. What changed:  medication strength  how much to take   meclizine 25 MG tablet Commonly known as:  ANTIVERT Take 25 mg by mouth 2 (two) times daily.   metFORMIN 500 MG tablet Commonly known as:  GLUCOPHAGE Take 500 mg by mouth 2 (two) times daily with a meal.   metoprolol succinate 50 MG 24 hr tablet Commonly known as:  TOPROL XL Take 1 tablet (50 mg total) by mouth daily. What changed:  medication strength  how much to take   mirtazapine 15 MG tablet Commonly known as:  REMERON Take 7.5 mg by mouth at bedtime.   sertraline 25 MG tablet Commonly known as:  ZOLOFT Take 25 mg by mouth daily.   Cholecalciferol 2000 units Caps Take 2,000  Units by mouth daily.   Vitamin D 2000 units Caps Take 1 capsule by mouth daily.      Follow-up Information    [name], MD Follow up in 1 week(s).   Specialty:  Family Medicine Why:  needs OSA study BP medications increased Contact information: [address] [place] [address] [phone]            Time coordinating discharge: 35 min  Signed:  JESSICA Juanetta Gosling   [facility] 05/11/2016, 3:51 PM

## 2016-05-11 NOTE — Care Management Obs Status (Signed)
MEDICARE OBSERVATION STATUS NOTIFICATION   Patient Details  Name: Alan [name] MRN: [id number] Date of Birth: 1940/06/22   Medicare Observation Status Notification Given:  Yes    Darrold Span, RN 05/11/2016, 2:20 PM

## 2016-05-11 NOTE — Progress Notes (Signed)
Inpatient Diabetes Program Recommendations  AACE/ADA: New Consensus Statement on Inpatient Glycemic Control (2015)  Target Ranges:  Prepandial:   less than 140 mg/dL      Peak postprandial:   less than 180 mg/dL (1-2 hours)      Critically ill patients:  140 - 180 mg/dL   Lab Results  Component Value Date   GLUCAP 174 (H) 05/11/2016    Review of Glycemic Control:  Results for JERRIL, REANEY (MRN [id number]) as of 05/11/2016 13:57  Ref. Range 05/10/2016 15:09  Glucose Latest Ref Range: 65 - 99 mg/dL 564 (H)  Results for QUINZELL, FOLLETT (MRN [id number]) as of 05/11/2016 13:57  Ref. Range 05/10/2016 20:20 05/11/2016 06:14  Glucose-Capillary Latest Ref Range: 65 - 99 mg/dL 166 (H) 063 (H)   Diabetes history: Type 2 diabetes Outpatient Diabetes medications: Glucotrol 10 mg bid, Metformin 500 mg bid Current orders for Inpatient glycemic control:  Novolog sensitive tid with meals and HS  Inpatient Diabetes Program Recommendations:   While in the hospital,consider checking A1C to determine pre-hospitalization glycemic control? Also consider adding Lantus 15 units daily while patient is in the hospital?  Thanks, Beryl Meager, RN, BC-ADM Inpatient Diabetes Coordinator Pager [phone] (8a-5p)

## 2016-05-11 NOTE — Progress Notes (Signed)
Pt has been discharged home. IV and telemetry box were removed. Pt received discharge instructions and all questions were answered. Pt left with all of his belongings. Pt left the unit via wheelchair and was accompanied by this RN.   Berdine Dance BSN, RN

## 2016-05-19 ENCOUNTER — Other Ambulatory Visit: Payer: Self-pay | Admitting: Family Medicine

## 2016-05-19 ENCOUNTER — Encounter: Payer: Self-pay | Admitting: Cardiovascular Disease

## 2016-05-19 DIAGNOSIS — E041 Nontoxic single thyroid nodule: Secondary | ICD-10-CM

## 2016-05-25 ENCOUNTER — Ambulatory Visit (INDEPENDENT_AMBULATORY_CARE_PROVIDER_SITE_OTHER): Payer: Medicare Other | Admitting: Pharmacist

## 2016-05-25 VITALS — BP 122/64 | HR 56 | Wt 234.5 lb

## 2016-05-25 DIAGNOSIS — I428 Other cardiomyopathies: Secondary | ICD-10-CM

## 2016-05-25 DIAGNOSIS — I200 Unstable angina: Secondary | ICD-10-CM

## 2016-05-25 NOTE — Progress Notes (Signed)
Patient ID: Alan [name]                 DOB: [birth year]                      MRN: [id number]     HPI: Alan [name] is a 76 y.o. male patient of Dr. [name] referred by Dr. Delton See with PMH below who presents today for [facility] initation.He underwent LHC back in 2014 that showed widely patent coronaries with segmental LV dysfunction. Last echo showed EF of 40% with diffuse hypokinesis worse in the inferior base, with G1DD. His stress test was negative for ischemia, but did have decreased LVEF to 30-35% on 05/11/16. [facility], but states today he does not think sleep study is needed.   His biggest concern today is about the [place] for thyroid nodules. He states his new PCP wants to do further work up on them, but he would like Dr. Earmon Phoenix recommendations as he has been following these nodules for years and does not feel these need additional workup unless Dr. [name] feels that he does.   He reports his sleeping issues have improved with taking lisinopril separate from metoprolol (which we discussed could be of concern with Entresto as it is a BID medication). He is willing to do trial on medication.    Cardiac Hx: Nonischemic cardiomyopathy, Thoracic aortic aneurysm without rupture, DM, HLD, insomnia  Current HTN meds:  Lisinopril 10mg  daily  Metoprolol succinate 50mg  daily QHS  Family History: Mother with cardiac issues. Father with heart attack.   Home BP readings: He does not monitor at home currently.   Wt Readings from Last 3 Encounters:  05/25/16 234 lb 8 oz (106.4 kg)  05/10/16 240 lb 8 oz (109.1 kg)  02/16/16 229 lb (103.9 kg)   BP Readings from Last 3 Encounters:  05/25/16 122/64  05/11/16 (!) 145/60  02/16/16 134/62   Pulse Readings from Last 3 Encounters:  05/25/16 (!) 56  05/11/16 68  02/16/16 67    Renal function: Estimated Creatinine Clearance: 76.9 mL/min (by C-G formula based on SCr of 1.03 mg/dL).  Past Medical  History:  Diagnosis Date  . Arthritis   . Diabetes mellitus [facility]AL MED  . ED (erectile dysfunction)   . Glaucoma of right eye   . History of prostate cancer   . Malfunction of artificial urethral sphincter (HCC)   . Malfunction of penile prosthesis (HCC)   . Nocturia   . SUI (stress urinary incontinence), male     Current Outpatient Prescriptions on File Prior to Visit  Medication Sig Dispense Refill  . aspirin 81 MG tablet Take 81 mg by mouth daily.    . Cholecalciferol (VITAMIN D) 2000 UNITS CAPS Take 1 capsule by mouth daily.     . Cholecalciferol 2000 units CAPS Take 2,000 Units by mouth daily.    Marland Kitchen docusate sodium (COLACE) 100 MG capsule Take 100 mg by mouth as needed for mild constipation.    . dorzolamide-timolol (COSOPT) 22.3-6.8 MG/ML ophthalmic solution Place 1 drop into both eyes 2 (two) times daily.     Marland Kitchen glipiZIDE (GLUCOTROL) 10 MG tablet Take 10 mg by mouth 2 (two) times daily before a meal.    . latanoprost (XALATAN) 0.005 % ophthalmic solution Place 1 drop into both eyes at bedtime.     . meclizine (ANTIVERT) 25 MG tablet Take 25 mg by mouth 2 (two) times daily.    Marland Kitchen  metFORMIN (GLUCOPHAGE) 500 MG tablet Take 500 mg by mouth 2 (two) times daily with a meal.    . metoprolol succinate (TOPROL-XL) 50 MG 24 hr tablet Take 1 tablet (50 mg total) by mouth daily. 30 tablet 0  . sertraline (ZOLOFT) 25 MG tablet Take 25 mg by mouth daily.    Marland Kitchen b complex vitamins tablet Take 2 tablets by mouth daily. 500mg .     No current facility-administered medications on file prior to visit.     Allergies  Allergen Reactions  . Avandia [Rosiglitazone] Other (See Comments)    Caused heart issues  . Metformin And Related Other (See Comments)    Vertigo    Blood pressure 122/64, pulse (!) 56, weight 234 lb 8 oz (106.4 kg).   Assessment/Plan: Entresto initation: Will stop lisinopril and do 36 hour wash out before starting Entresto 24/26mg  BID. 2 week supply of Entresto provided to  patient as will ultimately need RX sent to [facility], but would prefer to wait until titration complete. Instructed him to call if he notices sleep becomes worse with taking Entresto and metoprolol together in the evening (since he previously had this issue on lisinopril). Will see patient in 2 weeks for BP check as well as BMET.  Per patient request will forward to Dr. [name] for comments on thyroid nodules.   Thank you, Freddie Apley. Cleatis Polka, PharmD  [facility]  05/26/2016 10:50 AM    Addendum: Jeanene Erb and made patient aware of Dr. Earmon Phoenix recommendations to proceed with thyroid US. Patient states understanding and appreciation.

## 2016-05-25 NOTE — Patient Instructions (Addendum)
We will plan to start Entresto twice daily - I will call you if Dr. [name] has any reservations about starting Entresto.  Last dose of lisinopril on Friday and start Entresto on Monday (Feb 5th) take twice a day about 12 hours apart.   [phone]

## 2016-05-27 NOTE — Progress Notes (Signed)
Reviewed CT - this has been noted in past but radiologist now recommends dedicated thyroid ultrasound. I agree this should be done.   Thanks Nicholaus Bloom

## 2016-06-01 ENCOUNTER — Telehealth: Payer: Self-pay | Admitting: Cardiovascular Disease

## 2016-06-01 ENCOUNTER — Telehealth: Payer: Self-pay | Admitting: Pharmacist

## 2016-06-01 MED ORDER — LISINOPRIL 10 MG PO TABS: 10.0000 mg | ORAL_TABLET | Freq: Every day | ORAL | 1 refills | Status: DC

## 2016-06-01 NOTE — Telephone Encounter (Signed)
Patient is calling because he is having some issues with the entresto medication and would like to discuss with someone, please call. Thanks.

## 2016-06-01 NOTE — Telephone Encounter (Signed)
Reviewed and will forward to Dr [name] for further recommendations if needed.

## 2016-06-01 NOTE — Telephone Encounter (Signed)
Patient called to report that he is unable to tolerate Entresto after one dose he became nauseous and felt poorly all evening so he will not continue on the medication.  Will have him switch back to lisinopril. Since there are no current recommendations for changing back to ACEi will recommend he stop Entresto today and restart lisinopril in 2 days. He states understanding. Have also cancelled appt for Entresto titrate since he will no longer be on medication. Will also cancel BMET. He requests this be forwarded to Dr. Earmon Phoenix nurse and this has been done.

## 2016-06-01 NOTE — Telephone Encounter (Signed)
Please see documentation from Landis Martins [facility] in regards to the pt not being able to tolerate Entresto.  I will forward this information to Dr [name] to review and make further recommendations if needed.

## 2016-06-01 NOTE — Telephone Encounter (Signed)
Reviewed chart and it looks like Landis Martins, [facility] is in the process of speaking with pt.

## 2016-06-02 NOTE — Telephone Encounter (Signed)
Agree with plan. thanks

## 2016-06-04 ENCOUNTER — Ambulatory Visit
Admission: RE | Admit: 2016-06-04 | Discharge: 2016-06-04 | Disposition: A | Discharge status: Home / Self Care | Payer: Medicare Other | Source: Ambulatory Visit | Attending: Family Medicine | Admitting: Family Medicine

## 2016-06-04 DIAGNOSIS — E041 Nontoxic single thyroid nodule: Secondary | ICD-10-CM

## 2016-06-09 ENCOUNTER — Other Ambulatory Visit: Payer: Self-pay | Admitting: Pediatrics

## 2016-06-11 ENCOUNTER — Other Ambulatory Visit: Payer: Self-pay | Admitting: Family Medicine

## 2016-06-11 DIAGNOSIS — E041 Nontoxic single thyroid nodule: Secondary | ICD-10-CM

## 2016-06-14 ENCOUNTER — Other Ambulatory Visit: Payer: Medicare Other

## 2016-06-14 ENCOUNTER — Ambulatory Visit: Payer: Medicare Other

## 2016-06-16 ENCOUNTER — Ambulatory Visit
Admission: RE | Admit: 2016-06-16 | Discharge: 2016-06-16 | Disposition: A | Discharge status: Home / Self Care | Payer: Medicare Other | Source: Ambulatory Visit | Attending: Family Medicine | Admitting: Family Medicine

## 2016-06-16 ENCOUNTER — Other Ambulatory Visit (HOSPITAL_COMMUNITY)
Admission: RE | Admit: 2016-06-16 | Discharge: 2016-06-16 | Disposition: A | Discharge status: Home / Self Care | Payer: Medicare Other | Source: Ambulatory Visit | Attending: Physician Assistant | Admitting: Physician Assistant

## 2016-06-16 DIAGNOSIS — E041 Nontoxic single thyroid nodule: Secondary | ICD-10-CM

## 2016-06-16 NOTE — Procedures (Signed)
PROCEDURE SUMMARY:  Using direct ultrasound guidance, 4 passes were made using 25 g needles into the nodule within the right lobe of the thyroid.   Ultrasound was used to confirm needle placements on all occasions.   Specimens were sent to Pathology for analysis.  Royalty Fakhouri S Caysen Whang PA-C 06/16/2016 2:45 PM

## 2016-06-16 NOTE — Progress Notes (Signed)
  Pt is s/p thyroid biopsy today  He called and c/o swelling and dysphagia  We had him return to the office for evaluation  Repeat US was done and he did develop and interval hematoma at the biopsy site  He has been using ice packs and states that the swelling has already improved some.  He is breathing normally. No tracheal deviation. No visible neck edema.  He was instructed to sit up for several hours and keep ice to the area.  He understands  He also understands if his condition should worsen he is to proceed to the ED or call 911.  Kesean Serviss S Shahzad Thomann PA-C 06/16/2016 5:29 PM

## 2016-06-17 ENCOUNTER — Encounter: Payer: Self-pay | Admitting: Radiology

## 2016-06-17 ENCOUNTER — Other Ambulatory Visit: Payer: Self-pay | Admitting: Radiology

## 2016-06-17 ENCOUNTER — Ambulatory Visit
Admission: RE | Admit: 2016-06-17 | Discharge: 2016-06-17 | Disposition: A | Discharge status: Home / Self Care | Payer: Self-pay | Source: Ambulatory Visit | Attending: Radiology | Admitting: Radiology

## 2016-06-17 DIAGNOSIS — R609 Edema, unspecified: Secondary | ICD-10-CM

## 2016-06-17 HISTORY — PX: IR GENERIC HISTORICAL: IMG1180011

## 2016-06-17 NOTE — Progress Notes (Signed)
Pt s/p (R)thyroid biopsy yesterday. Came back in with c/o swelling and bruising. Found to have hematoma. Advised to use ice and rest.  He comes back today with c/o continued swelling and difficulty breathing, mostly with laying flat. He also feels like he has something stuck in his throat and he has to cough it up but nothing really comes up. He denies Cp or SOB while sitting up in chair.  BP (!) 163/63 (BP Location: Left Arm, Patient Position: Sitting, Cuff Size: Large)   Pulse 64   Temp 98.1 F (36.7 C) (Oral)   Resp 17   SpO2 98%  Ant neck with ecchymosis and swelling, though it is very soft. I cannot appreciate a big hematoma and I think he just has extensive soft tissue bruising, which is causing some pressure on his trachea, but no obastruction. No stridor or wheezing heard.  Seen with and by Dr. Bonnielee Haff. Feel this is likely hematoma/ecchymosis with some mass effect but no airway compromise. Vitals are good. Discussed that he will likely have these sxs for the next couple days but they should start to improve resolve. He needs to alternate ice/heat. Advise him to sleep in recliner/chair and avoid laying flat if possible. May use Tylenol for discomfort and avoid ASA/NSAIDs   Brayton El PA-C Interventional Radiology 06/17/2016 4:08 PM

## 2016-09-08 ENCOUNTER — Inpatient Hospital Stay (HOSPITAL_COMMUNITY)
Admission: EM | Admit: 2016-09-08 | Discharge: 2016-09-11 | DRG: 292 | Disposition: A | Discharge status: Home Health | Payer: Medicare Other | Attending: Internal Medicine | Admitting: Internal Medicine

## 2016-09-08 ENCOUNTER — Emergency Department (HOSPITAL_COMMUNITY): Payer: Medicare Other

## 2016-09-08 ENCOUNTER — Telehealth: Payer: Self-pay | Admitting: Cardiovascular Disease

## 2016-09-08 ENCOUNTER — Encounter (HOSPITAL_COMMUNITY): Payer: Self-pay | Admitting: *Deleted

## 2016-09-08 DIAGNOSIS — I5021 Acute systolic (congestive) heart failure: Secondary | ICD-10-CM | POA: Diagnosis present

## 2016-09-08 DIAGNOSIS — I350 Nonrheumatic aortic (valve) stenosis: Secondary | ICD-10-CM | POA: Diagnosis not present

## 2016-09-08 DIAGNOSIS — Z8546 Personal history of malignant neoplasm of prostate: Secondary | ICD-10-CM | POA: Diagnosis not present

## 2016-09-08 DIAGNOSIS — Z7982 Long term (current) use of aspirin: Secondary | ICD-10-CM

## 2016-09-08 DIAGNOSIS — E041 Nontoxic single thyroid nodule: Secondary | ICD-10-CM | POA: Diagnosis present

## 2016-09-08 DIAGNOSIS — I5022 Chronic systolic (congestive) heart failure: Secondary | ICD-10-CM | POA: Diagnosis present

## 2016-09-08 DIAGNOSIS — I712 Thoracic aortic aneurysm, without rupture, unspecified: Secondary | ICD-10-CM | POA: Diagnosis present

## 2016-09-08 DIAGNOSIS — I5023 Acute on chronic systolic (congestive) heart failure: Principal | ICD-10-CM | POA: Diagnosis present

## 2016-09-08 DIAGNOSIS — R1319 Other dysphagia: Secondary | ICD-10-CM | POA: Diagnosis not present

## 2016-09-08 DIAGNOSIS — Z8249 Family history of ischemic heart disease and other diseases of the circulatory system: Secondary | ICD-10-CM

## 2016-09-08 DIAGNOSIS — E1165 Type 2 diabetes mellitus with hyperglycemia: Secondary | ICD-10-CM | POA: Diagnosis present

## 2016-09-08 DIAGNOSIS — I429 Cardiomyopathy, unspecified: Secondary | ICD-10-CM | POA: Diagnosis present

## 2016-09-08 DIAGNOSIS — I428 Other cardiomyopathies: Secondary | ICD-10-CM | POA: Diagnosis not present

## 2016-09-08 DIAGNOSIS — Z6834 Body mass index (BMI) 34.0-34.9, adult: Secondary | ICD-10-CM

## 2016-09-08 DIAGNOSIS — R0602 Shortness of breath: Secondary | ICD-10-CM | POA: Diagnosis present

## 2016-09-08 DIAGNOSIS — R1310 Dysphagia, unspecified: Secondary | ICD-10-CM

## 2016-09-08 DIAGNOSIS — Z7984 Long term (current) use of oral hypoglycemic drugs: Secondary | ICD-10-CM | POA: Diagnosis not present

## 2016-09-08 DIAGNOSIS — I509 Heart failure, unspecified: Secondary | ICD-10-CM

## 2016-09-08 DIAGNOSIS — K224 Dyskinesia of esophagus: Secondary | ICD-10-CM | POA: Diagnosis present

## 2016-09-08 LAB — I-STAT TROPONIN, ED: TROPONIN I, POC: 0 ng/mL (ref 0.00–0.08)

## 2016-09-08 LAB — CBC WITH DIFFERENTIAL/PLATELET
BASOS PCT: 0 %
Basophils Absolute: 0 10*3/uL (ref 0.0–0.1)
EOS ABS: 0.2 10*3/uL (ref 0.0–0.7)
EOS PCT: 2 %
HCT: 37.1 % — ABNORMAL LOW (ref 39.0–52.0)
Hemoglobin: 12.1 g/dL — ABNORMAL LOW (ref 13.0–17.0)
LYMPHS ABS: 1.6 10*3/uL (ref 0.7–4.0)
Lymphocytes Relative: 26 %
MCH: 28.3 pg (ref 26.0–34.0)
MCHC: 32.6 g/dL (ref 30.0–36.0)
MCV: 86.7 fL (ref 78.0–100.0)
Monocytes Absolute: 0.7 10*3/uL (ref 0.1–1.0)
Monocytes Relative: 12 %
Neutro Abs: 3.7 10*3/uL (ref 1.7–7.7)
Neutrophils Relative %: 60 %
PLATELETS: 172 10*3/uL (ref 150–400)
RBC: 4.28 MIL/uL (ref 4.22–5.81)
RDW: 14.5 % (ref 11.5–15.5)
WBC: 6.2 10*3/uL (ref 4.0–10.5)

## 2016-09-08 LAB — COMPREHENSIVE METABOLIC PANEL
ALBUMIN: 3.8 g/dL (ref 3.5–5.0)
ALT: 69 U/L — AB (ref 17–63)
ANION GAP: 7 (ref 5–15)
AST: 36 U/L (ref 15–41)
Alkaline Phosphatase: 47 U/L (ref 38–126)
BUN: 15 mg/dL (ref 6–20)
CHLORIDE: 110 mmol/L (ref 101–111)
CO2: 23 mmol/L (ref 22–32)
Calcium: 9.2 mg/dL (ref 8.9–10.3)
Creatinine, Ser: 0.92 mg/dL (ref 0.61–1.24)
GFR calc non Af Amer: >60 mL/min (ref >60)
Glucose, Bld: 186 mg/dL — ABNORMAL HIGH (ref 65–99)
Potassium: 4.3 mmol/L (ref 3.5–5.1)
SODIUM: 140 mmol/L (ref 135–145)
Total Bilirubin: 0.7 mg/dL (ref 0.3–1.2)
Total Protein: 6.3 g/dL — ABNORMAL LOW (ref 6.5–8.1)

## 2016-09-08 LAB — GLUCOSE, CAPILLARY: GLUCOSE-CAPILLARY: 212 mg/dL — AB (ref 65–99)

## 2016-09-08 LAB — BRAIN NATRIURETIC PEPTIDE: B Natriuretic Peptide: 757.9 pg/mL — ABNORMAL HIGH (ref 0.0–100.0)

## 2016-09-08 MED ORDER — LISINOPRIL 10 MG PO TABS
10.0000 mg | ORAL_TABLET | Freq: Every day | ORAL | Status: DC
  Administered 2016-09-09 – 2016-09-11 (×3): 10 mg via ORAL
  Filled 2016-09-08 (×3): qty 1

## 2016-09-08 MED ORDER — ACETAMINOPHEN 325 MG PO TABS: 650.0000 mg | ORAL_TABLET | ORAL | Status: DC | PRN

## 2016-09-08 MED ORDER — ENOXAPARIN SODIUM 40 MG/0.4ML ~~LOC~~ SOLN
40.0000 mg | SUBCUTANEOUS | Status: DC
  Administered 2016-09-09 – 2016-09-10 (×2): 40 mg via SUBCUTANEOUS
  Filled 2016-09-08 (×2): qty 0.4

## 2016-09-08 MED ORDER — LISINOPRIL 10 MG PO TABS: 10.0000 mg | ORAL_TABLET | Freq: Every day | ORAL | Status: DC

## 2016-09-08 MED ORDER — LORAZEPAM 1 MG PO TABS
1.0000 mg | ORAL_TABLET | Freq: Once | ORAL | Status: AC
  Administered 2016-09-09: 1 mg via ORAL
  Filled 2016-09-08: qty 1

## 2016-09-08 MED ORDER — MECLIZINE HCL 25 MG PO TABS
25.0000 mg | ORAL_TABLET | Freq: Two times a day (BID) | ORAL | Status: DC
  Administered 2016-09-09 – 2016-09-11 (×5): 25 mg via ORAL
  Filled 2016-09-08 (×5): qty 1

## 2016-09-08 MED ORDER — VITAMIN D3 25 MCG (1000 UNIT) PO TABS
2000.0000 [IU] | ORAL_TABLET | Freq: Every day | ORAL | Status: DC
  Administered 2016-09-09 – 2016-09-11 (×3): 2000 [IU] via ORAL
  Filled 2016-09-08 (×6): qty 2

## 2016-09-08 MED ORDER — SODIUM CHLORIDE 0.9 % IV SOLN: 250.0000 mL | INTRAVENOUS | Status: DC | PRN

## 2016-09-08 MED ORDER — ASPIRIN 81 MG PO CHEW
324.0000 mg | CHEWABLE_TABLET | Freq: Once | ORAL | Status: AC
  Administered 2016-09-08: 324 mg via ORAL
  Filled 2016-09-08: qty 4

## 2016-09-08 MED ORDER — RESOURCE THICKENUP CLEAR PO POWD
ORAL | Status: DC | PRN
  Filled 2016-09-08: qty 125

## 2016-09-08 MED ORDER — ONDANSETRON HCL 4 MG/2ML IJ SOLN: 4.0000 mg | Freq: Four times a day (QID) | INTRAMUSCULAR | Status: DC | PRN

## 2016-09-08 MED ORDER — DORZOLAMIDE HCL-TIMOLOL MAL 2-0.5 % OP SOLN
1.0000 [drp] | Freq: Two times a day (BID) | OPHTHALMIC | Status: DC
  Administered 2016-09-08 – 2016-09-11 (×6): 1 [drp] via OPHTHALMIC
  Filled 2016-09-08: qty 10

## 2016-09-08 MED ORDER — GLIPIZIDE 10 MG PO TABS
10.0000 mg | ORAL_TABLET | Freq: Two times a day (BID) | ORAL | Status: DC
  Administered 2016-09-09 – 2016-09-11 (×5): 10 mg via ORAL
  Filled 2016-09-08 (×5): qty 1

## 2016-09-08 MED ORDER — LATANOPROST 0.005 % OP SOLN
1.0000 [drp] | Freq: Every day | OPHTHALMIC | Status: DC
  Administered 2016-09-08 – 2016-09-10 (×3): 1 [drp] via OPHTHALMIC
  Filled 2016-09-08: qty 2.5

## 2016-09-08 MED ORDER — INSULIN ASPART 100 UNIT/ML ~~LOC~~ SOLN
0.0000 [IU] | Freq: Every day | SUBCUTANEOUS | Status: DC
  Administered 2016-09-08: 2 [IU] via SUBCUTANEOUS
  Administered 2016-09-09: 3 [IU] via SUBCUTANEOUS

## 2016-09-08 MED ORDER — INSULIN ASPART 100 UNIT/ML ~~LOC~~ SOLN
0.0000 [IU] | Freq: Three times a day (TID) | SUBCUTANEOUS | Status: DC
  Administered 2016-09-09 (×2): 3 [IU] via SUBCUTANEOUS
  Administered 2016-09-09: 11 [IU] via SUBCUTANEOUS
  Administered 2016-09-10: 8 [IU] via SUBCUTANEOUS
  Administered 2016-09-10: 3 [IU] via SUBCUTANEOUS
  Administered 2016-09-10: 15 [IU] via SUBCUTANEOUS
  Administered 2016-09-11: 5 [IU] via SUBCUTANEOUS

## 2016-09-08 MED ORDER — SODIUM CHLORIDE 0.9% FLUSH
3.0000 mL | Freq: Two times a day (BID) | INTRAVENOUS | Status: DC
  Administered 2016-09-08 – 2016-09-11 (×6): 3 mL via INTRAVENOUS

## 2016-09-08 MED ORDER — ASPIRIN EC 81 MG PO TBEC
81.0000 mg | DELAYED_RELEASE_TABLET | Freq: Every day | ORAL | Status: DC
  Administered 2016-09-09 – 2016-09-11 (×3): 81 mg via ORAL
  Filled 2016-09-08 (×3): qty 1

## 2016-09-08 MED ORDER — B COMPLEX-C PO TABS
1.0000 | ORAL_TABLET | Freq: Two times a day (BID) | ORAL | Status: DC
  Administered 2016-09-08 – 2016-09-11 (×6): 1 via ORAL
  Filled 2016-09-08 (×6): qty 1

## 2016-09-08 MED ORDER — SODIUM CHLORIDE 0.9% FLUSH: 3.0000 mL | INTRAVENOUS | Status: DC | PRN

## 2016-09-08 MED ORDER — FUROSEMIDE 10 MG/ML IJ SOLN
40.0000 mg | Freq: Once | INTRAMUSCULAR | Status: AC
  Administered 2016-09-08: 40 mg via INTRAVENOUS
  Filled 2016-09-08: qty 4

## 2016-09-08 MED ORDER — FUROSEMIDE 10 MG/ML IJ SOLN
60.0000 mg | Freq: Two times a day (BID) | INTRAMUSCULAR | Status: DC
  Administered 2016-09-09 – 2016-09-11 (×5): 60 mg via INTRAVENOUS
  Filled 2016-09-08 (×5): qty 6

## 2016-09-08 NOTE — H&P (Signed)
TRH H&P   Patient Demographics:    Alan [name], is a 76 y.o. male  MRN: [id number]   DOB - 05-Sep-1940  Admit Date - 09/08/2016  Outpatient Primary MD for the patient is Juluis Rainier, MD  Referring MD: Dr Doristine Counter  Outpatient Specialists: Dr [name] (cardiology)  Patient coming from: home  Chief Complaint  Patient presents with  . Shortness of Breath      HPI:    Alan [name]  is a 76 y.o. male,With history of nonischemic cardiomyopathy (EF of 45-50% as per echo in January this year with Lexiscan showing global hypokinesis and EF of 33%; managed medically as per cardiology recommendations), ascending aortic aneurysm, incidental finding of thyroid nodule s/p biopsy who presented to the ED with progressive dyspnea on exertion associated with orthopnea and PND for past 8-9 days. Patient informs that he was moving stuff around the house recently and has been exposed to a lot of pollen. He thinks that this has triggered him to be increasingly short of breath, initially on exertion and now even at rest and for the past 3-4 days has been unable to lay down flat and noticed significant bilateral leg swellings. Previously he was able to walk up and down his basement now has difficulty walking even 3 feets without getting short of breath. He has also noticed abdominal distention. He denies any fever, chills, chest pain, palpitations, headache, cough, nausea, vomiting, abdominal pain, bowel or urinary symptoms. He informs being adherent into his diet and medications. Denies any sick contacts or recent travel.  For the past few weeks she also reports difficulty swallowing water and chokes on it. Denies any symptoms with thick liquid or solid. Denies any weight loss. (Has gained almost 8-9 pounds with leg swellings).  Course in the ED Vitals were stable. Patient found to have increased  leg swellings. Blood will done showed a normal WBC, hemoglobin of 12.1, normal BMET. Mildly elevated ALT of 69. I-STAT troponin was negative. Chest x-ray showed mild CHF. EKG showed first-degree AV block with RBBB (unchanged from prior). Patient given 40 mg IV Lasix and hospitalist admission requested to telemetry.     Review of systems:    In addition to the HPI above, No Fever-chills, No Headache, No changes with Vision or hearing, Difficulty swallowing thin liquid, no problems: Swelling. No Chest pain, Cough , shortness of breath, orthopnea and PND No Abdominal pain, No Nausea or vomiting, Bowel movements are regular, No Blood in stool or Urine, No dysuria, No new skin rashes or bruises, No new joints pains-aches,  No new weakness, tingling, numbness in any extremity, Weight gain No polyuria, polydypsia or polyphagia, No significant Mental Stressors.  A full 10 point Review of Systems was done, except as stated above, all other Review of Systems were negative.   With Past History of the following :    Past  Medical History:  Diagnosis Date  . Acute systolic (congestive) heart failure (HCC) 09/08/2016  . Arthritis   . Diabetes mellitus ORAL MED  . ED (erectile dysfunction)   . Glaucoma of right eye   . History of prostate cancer   . Malfunction of artificial urethral sphincter (HCC)   . Malfunction of penile prosthesis (HCC)   . Nocturia   . SUI (stress urinary incontinence), male       Past Surgical History:  Procedure Laterality Date  . I & D/ ORIF RIGHT INDEX FINGER  01-19-2005  . INSERTION INFLATABLE PENILE PROSTHESIS  06-22-1999   MENTOR ALPHA I  . IR GENERIC HISTORICAL  06/17/2016   IR RADIOLOGIST EVAL & MGMT 06/17/2016 Jolaine Click, MD [facility] INTERV RAD  . LEFT HEART CATHETERIZATION WITH CORONARY ANGIOGRAM N/A 03/15/2013   Procedure: LEFT HEART CATHETERIZATION WITH CORONARY ANGIOGRAM;  Surgeon: [name], MD;  Location: [facility] CATH LAB;  Service:  Cardiovascular;  Laterality: N/A;  . PENILE PROSTHESIS IMPLANT  09/24/2011   Procedure: PENILE PROTHESIS INFLATABLE;  Surgeon: [name], MD;  Location: [facility];  Service: Urology;  Laterality: N/A;  REPLACEMENT OF INFLATABLE PENILE PROTHESIS (COLOPLAST)    . PLACEMENT ARTIFICIAL URINARY SPHINTER  01-20-2001   [id number]  . REMOVAL AND REPLACEMENT GU SPHINTER CUFF  05-15-2004   STRESS URINARY INCONTINENCE      Social History:     Social History  Substance Use Topics  . Smoking status: Never Smoker  . Smokeless tobacco: Never Used  . Alcohol use No     Lives - Alone at home  Mobility - independent    Family History :     Family History  Problem Relation Age of Onset  . Heart Problems Mother   . Heart attack Father   . Healthy Sister        NO HEART ISSUES  . Healthy Sister        NO HEART ISSUES      Home Medications:   Prior to Admission medications   Medication Sig Start Date End Date Taking? Authorizing Provider  aspirin 81 MG tablet Take 81 mg by mouth daily.    [provider]  b complex vitamins tablet Take 2 tablets by mouth daily. 500mg .    [provider]  Cholecalciferol (VITAMIN D) 2000 UNITS CAPS Take 1 capsule by mouth daily.     [provider]  dorzolamide-timolol (COSOPT) 22.3-6.8 MG/ML ophthalmic solution Place 1 drop into both eyes 2 (two) times daily.  11/24/15   [provider]  glipiZIDE (GLUCOTROL) 10 MG tablet Take 10 mg by mouth 2 (two) times daily before a meal.    [provider]  latanoprost (XALATAN) 0.005 % ophthalmic solution Place 1 drop into both eyes at bedtime.     [provider]  lisinopril (PRINIVIL,ZESTRIL) 10 MG tablet Take 1 tablet (10 mg total) by mouth daily. 06/01/16   [name], MD  meclizine (ANTIVERT) 25 MG tablet Take 25 mg by mouth 2 (two) times daily.    [provider]  metFORMIN (GLUCOPHAGE) 500 MG tablet Take 500 mg by mouth 2  (two) times daily with a meal.    [provider]  metoprolol succinate (TOPROL-XL) 50 MG 24 hr tablet Take 1 tablet (50 mg total) by mouth daily. 05/11/16   Joseph Art, DO  sertraline (ZOLOFT) 25 MG tablet Take 25 mg by mouth daily. 05/07/16   [provider]  Allergies:     Allergies  Allergen Reactions  . Avandia [Rosiglitazone] Other (See Comments)    Caused heart issues  . Metformin And Related Other (See Comments)    Vertigo and possible sleeplessness??     Physical Exam:   Vitals  Blood pressure (!) 148/59, pulse 60, temperature 98.4 F (36.9 C), temperature source Oral, resp. rate 18, height 5\' 10"  (1.778 m), weight 109.8 kg (242 lb), SpO2 97 %.    General : Elderly male not in distress HEENT: Ears and eyes appear normal, no pallor, no icterus, moist oral mucosa, supple neck, no neck swelling, Chest: Clear to auscultation bilaterally, no added sounds CVS: Normal S1 and S2, no murmurs or gallop GI: Soft, mild abdominal distention protuberant umbilicus (chronic), nontender, bowel sounds present Musculoskeletal: Warm, 2+ pitting edema bilaterally CNS: Alert and oriented, nonfocal   Data Review:    CBC  Recent Labs Lab 09/08/16 1235  WBC 6.2  HGB 12.1*  HCT 37.1*  PLT 172  MCV 86.7  MCH 28.3  MCHC 32.6  RDW 14.5  LYMPHSABS 1.6  MONOABS 0.7  EOSABS 0.2  BASOSABS 0.0   ------------------------------------------------------------------------------------------------------------------  Chemistries   Recent Labs Lab 09/08/16 1235  NA 140  K 4.3  CL 110  CO2 23  GLUCOSE 186*  BUN 15  CREATININE 0.92  CALCIUM 9.2  AST 36  ALT 69*  ALKPHOS 47  BILITOT 0.7   ------------------------------------------------------------------------------------------------------------------ estimated creatinine clearance is 87.4 mL/min (by C-G formula based on SCr of 0.92  mg/dL). ------------------------------------------------------------------------------------------------------------------ No results for input(s): TSH, T4TOTAL, T3FREE, THYROIDAB in the last 72 hours.  Invalid input(s): FREET3  Coagulation profile No results for input(s): INR, PROTIME in the last 168 hours. ------------------------------------------------------------------------------------------------------------------- No results for input(s): DDIMER in the last 72 hours. -------------------------------------------------------------------------------------------------------------------  Cardiac Enzymes No results for input(s): CKMB, TROPONINI, MYOGLOBIN in the last 168 hours.  Invalid input(s): CK ------------------------------------------------------------------------------------------------------------------ No results found for: BNP   ---------------------------------------------------------------------------------------------------------------  Urinalysis    Component Value Date/Time   COLORURINE YELLOW 02/16/2016 0431   APPEARANCEUR CLEAR 02/16/2016 0431   LABSPEC 1.020 02/16/2016 0431   PHURINE 5.0 02/16/2016 0431   GLUCOSEU NEGATIVE 02/16/2016 0431   HGBUR NEGATIVE 02/16/2016 0431   BILIRUBINUR NEGATIVE 02/16/2016 0431   KETONESUR NEGATIVE 02/16/2016 0431   PROTEINUR NEGATIVE 02/16/2016 0431   NITRITE NEGATIVE 02/16/2016 0431   LEUKOCYTESUR NEGATIVE 02/16/2016 0431    ----------------------------------------------------------------------------------------------------------------   Imaging Results:    Dg Chest 2 View  Result Date: 09/08/2016 CLINICAL DATA:  Shortness of breath EXAM: CHEST  2 VIEW COMPARISON:  05/10/2016 FINDINGS: Chronic cardiomegaly. There is vascular pedicle widening, Kerley lines, and fissural thickening. Negative for pleural effusion or air bronchograms. Stable aortic contours. No acute osseous finding IMPRESSION: Mild CHF. Electronically  Signed   By: Marnee Spring M.D.   On: 09/08/2016 13:13    My personal review of EKG: Normal sinus rhythm at 60 with first-degree AV block, PACs and RBBB.   Assessment & Plan:    Principal Problem:   Acute On chronic systolic (congestive) heart failure (HCC) Admit to telemetry. Likely triggered by recent allergies. No recent illnesses or sick contact. He reports being adherent to his medications. Place on IV Lasix 60 mg every 12 hours. Strict I/O and daily weight. -Repeat 2-D echo. Last 2-D echo from January 2018 showing EF of 40-45%; Lexiscan showed EF of 33% and recommended for medical management. -Resume aspirin, lisinopril and statin. Holding beta blocker due to acute decompensation. -Consult heart failure team  if symptoms unimproved.  Active Problems:   NICM (nonischemic cardiomyopathy) (HCC) Nonreversible ischemia on Lexiscan Myoview in January 2018 with global hypokinesia most severe in inferior wall and EF of 33%. Cardiology recommended medical management.    Thoracic aortic aneurysm without rupture (HCC) Stable 5.1 cm ascending aortic aneurysm seen on CT angiogram in January 2018. Recommend semiannual imaging followed by CTA or MRA and referral to cardiothoracic surgery.    DM (diabetes mellitus) type II (HCC) Continue glipizide and monitor on sliding scale coverage. Check A1c. Hold metformin.     Right Thyroid nodule Patient underwent rt thyroid node bx in feb 2018 which showed small group of follicular epithelial cells inconclusive for follicular new present. Denies any weight loss.  Spoke with oncologist on call Dr. Candise Che. Recommends if patient has high suspicion for follicular lesion or malignancy this is really worked up by as outpatient with either radioiodine uptake study or excisional biopsy. Patient will need outpatient referral to endocrinology upon discharge.   Dysphagia to liquids. Reports symptoms ongoing for past several weeks. TSH in January was normal. Will  obtain esophagogram.   Abdominal distention Suspected due to volume overload. LFTs normal except for mildly elevated ALT. Obtain ultrasound abdomen if symptoms unimproved despite diuresis.   DVT Prophylaxis : Lovenox  AM Labs Ordered, also please review Full Orders  Family Communication: Admission, patients condition and plan of care including tests being ordered have been discussed with the patient and His friend at bedside.  Code Status Full code  Likely DC to  Home  Condition : Fair   Consults called: None  Admission status: Inpatient   Time spent in minutes : 60   Eddie North M.D on 09/08/2016 at 5:39 PM  Between 7am to 7pm - Pager - [phone]. After 7pm go to [email or web address] - password [facility]  [facility] - Office  [phone]

## 2016-09-08 NOTE — ED Provider Notes (Signed)
[facility]-EMERGENCY DEPT Provider Note   CSN: [id number] Arrival date & time: 09/08/16  1219     History   Chief Complaint Chief Complaint  Patient presents with  . Shortness of Breath    HPI Alan [name] is a 76 y.o. male.  HPI Patient has been getting increasing short of breath for a week. He reports that he is having increasing difficulty sleeping at night because he cannot lie down. He reports if he lies flat he cannot breathe. He reports that night he coughs up clear phlegm. He reports last night was so bad that he had to stay up all night long. He reports that he is swelling in his feet which is unusual for him. He reports he has gained 6 pounds in one week. He denies chest pain. No fever no cough. Due to problems with esophageal nodules and a prior thyroid problem he reports difficulty swallowing and often coughs and chokes on fluids. This is been an ongoing situation not acute associated with today's presentation. Patient denies any known history of congestive heart failure. He sees Dr. [name] for cardiology. Past Medical History:  Diagnosis Date  . Arthritis   . Diabetes mellitus ORAL MED  . ED (erectile dysfunction)   . Glaucoma of right eye   . History of prostate cancer   . Malfunction of artificial urethral sphincter (HCC)   . Malfunction of penile prosthesis (HCC)   . Nocturia   . SUI (stress urinary incontinence), male     Patient Active Problem List   Diagnosis Date Noted  . Chest pressure 05/10/2016  . Insomnia 05/10/2016  . DM (diabetes mellitus) (HCC) 05/10/2016  . NICM (nonischemic cardiomyopathy) (HCC) 02/06/2015  . Thoracic aortic aneurysm without rupture (HCC) 02/06/2015  . Aortic insufficiency 02/06/2015  . Hyperlipidemia 02/06/2015  . 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 Malfunction of penile prosthesis (HCC) 09/24/2011    Past Surgical History:  Procedure Laterality Date  . I & D/ ORIF RIGHT INDEX FINGER  01-19-2005  .  INSERTION INFLATABLE PENILE PROSTHESIS  06-22-1999   MENTOR ALPHA I  . IR GENERIC HISTORICAL  06/17/2016   IR RADIOLOGIST EVAL & MGMT 06/17/2016 Jolaine Click, MD [facility] INTERV RAD  . LEFT HEART CATHETERIZATION WITH CORONARY ANGIOGRAM N/A 03/15/2013   Procedure: LEFT HEART CATHETERIZATION WITH CORONARY ANGIOGRAM;  Surgeon: [name], MD;  Location: [facility] CATH LAB;  Service: Cardiovascular;  Laterality: N/A;  . PENILE PROSTHESIS IMPLANT  09/24/2011   Procedure: PENILE PROTHESIS INFLATABLE;  Surgeon: [name], MD;  Location: [facility];  Service: Urology;  Laterality: N/A;  REPLACEMENT OF INFLATABLE PENILE PROTHESIS (COLOPLAST)    . PLACEMENT ARTIFICIAL URINARY SPHINTER  01-20-2001   [id number]  . REMOVAL AND REPLACEMENT GU SPHINTER CUFF  05-15-2004   STRESS URINARY INCONTINENCE       Home Medications    Prior to Admission medications   Medication Sig Start Date End Date Taking? Authorizing Provider  aspirin 81 MG tablet Take 81 mg by mouth daily.    [provider]  b complex vitamins tablet Take 2 tablets by mouth daily. 500mg .    [provider]  Cholecalciferol (VITAMIN D) 2000 UNITS CAPS Take 1 capsule by mouth daily.     [provider]  Cholecalciferol 2000 units CAPS Take 2,000 Units by mouth daily.    [provider]  docusate sodium (COLACE) 100 MG capsule Take 100 mg by mouth as needed for mild constipation.  [provider]  dorzolamide-timolol (COSOPT) 22.3-6.8 MG/ML ophthalmic solution Place 1 drop into both eyes 2 (two) times daily.  11/24/15   [provider]  glipiZIDE (GLUCOTROL) 10 MG tablet Take 10 mg by mouth 2 (two) times daily before a meal.    [provider]  latanoprost (XALATAN) 0.005 % ophthalmic solution Place 1 drop into both eyes at bedtime.     [provider]  lisinopril (PRINIVIL,ZESTRIL) 10 MG tablet Take 1 tablet (10 mg total) by mouth daily. 06/01/16   [name], MD  meclizine (ANTIVERT) 25 MG tablet Take 25 mg by mouth 2 (two) times daily.    [provider]  metFORMIN (GLUCOPHAGE) 500 MG tablet Take 500 mg by mouth 2 (two) times daily with a meal.    [provider]  metoprolol succinate (TOPROL-XL) 50 MG 24 hr tablet Take 1 tablet (50 mg total) by mouth daily. 05/11/16   Joseph Art, DO  sertraline (ZOLOFT) 25 MG tablet Take 25 mg by mouth daily. 05/07/16   [provider]    Family History Family History  Problem Relation Age of Onset  . Heart Problems Mother   . Heart attack Father   . Healthy Sister        NO HEART ISSUES  . Healthy Sister        NO HEART ISSUES    Social History Social History  Substance Use Topics  . Smoking status: Never Smoker  . Smokeless tobacco: Never Used  . Alcohol use No     Allergies   Avandia [rosiglitazone] and Metformin and related   Review of Systems Review of Systems  10 Systems reviewed and are negative for acute change except as noted in the HPI.\ Physical Exam Updated Vital Signs BP (!) 141/55 (BP Location: Right Arm)   Pulse 60   Temp 98.4 F (36.9 C) (Oral)   Resp 20   Ht 5\' 10"  (1.778 m)   Wt 242 lb (109.8 kg)   SpO2 98%   BMI 34.72 kg/m   Physical Exam  Constitutional: He is oriented to person, place, and time.  Alert nontoxic. Mild increased work of breathing at rest. Morbid obesity.  HENT:  Head: Normocephalic and atraumatic.  Mouth/Throat: Oropharynx is clear and moist.  Eyes: EOM are normal.  Cardiovascular: Normal rate, regular rhythm, normal heart sounds and intact distal pulses.   Pulmonary/Chest:  Mild increased work of breathing. Fine basilar rales.  Abdominal: Soft.  Abdomen is distended and obese. Nontender to palpation.  Musculoskeletal:  2+ pitting edema bilateral feet. Calves are nontender.  Neurological: He is alert and oriented to person, place, and time. No cranial nerve deficit. He exhibits normal muscle tone.  Coordination normal.  Skin: Skin is warm and dry.  Psychiatric: He has a normal mood and affect.     ED Treatments / Results  Labs (all labs ordered are listed, but only abnormal results are displayed) Labs Reviewed  CBC WITH DIFFERENTIAL/PLATELET - Abnormal; Notable for the following:       Result Value   Hemoglobin 12.1 (*)    HCT 37.1 (*)    All other components within normal limits  COMPREHENSIVE METABOLIC PANEL - Abnormal; Notable for the following:    Glucose, Bld 186 (*)    Total Protein 6.3 (*)    ALT 69 (*)    All other components within normal limits  BRAIN NATRIURETIC PEPTIDE  I-STAT TROPOININ, ED    EKG  EKG Interpretation  Date/Time:  Wednesday Sep 08 2016 12:25:12 EDT Ventricular Rate:  60 PR Interval:  220 QRS Duration: 152 QT Interval:  494 QTC Calculation: 494 R Axis:   -92 Text Interpretation:  Sinus rhythm with 1st degree A-V block with Premature atrial complexes Right bundle branch block Abnormal ECG no change Confirmed by Arby Barrette [phone]) on 09/08/2016 4:40:39 PM       Radiology Dg Chest 2 View  Result Date: 09/08/2016 CLINICAL DATA:  Shortness of breath EXAM: CHEST  2 VIEW COMPARISON:  05/10/2016 FINDINGS: Chronic cardiomegaly. There is vascular pedicle widening, Kerley lines, and fissural thickening. Negative for pleural effusion or air bronchograms. Stable aortic contours. No acute osseous finding IMPRESSION: Mild CHF. Electronically Signed   By: Marnee Spring M.D.   On: 09/08/2016 13:13    Procedures Procedures (including critical care time)  Medications Ordered in ED Medications  aspirin chewable tablet 324 mg (not administered)  furosemide (LASIX) injection 40 mg (not administered)     Initial Impression / Assessment and Plan / ED Course  I have reviewed the triage vital signs and the nursing notes.  Pertinent labs & imaging results that were available during my care of the patient were reviewed by me and considered in my  medical decision making (see chart for details).      Final Clinical Impressions(s) / ED Diagnoses   Final diagnoses:  Acute congestive heart failure, unspecified heart failure type [facility])   Patient presents with symptoms consistent with CHF. He has had weight gain, peripheral edema and orthopnea. Chest x-ray shows vascular congestion. Patient will be given Lasix and aspirin in the emergency department. He denies any chest pain. Plan will be for admission. New Prescriptions New Prescriptions   No medications on file     Arby Barrette, MD 09/11/16 [phone]

## 2016-09-08 NOTE — Telephone Encounter (Signed)
Operator states patient could not stay on the phone because EMS was making him hang up.  Did not try to return a phone call.  Pt going to ER.  Will forward to Dr. Earmon Phoenix nurse.

## 2016-09-08 NOTE — ED Triage Notes (Signed)
Pt reports SOB & bil lower extremity swelling onset x 6 days, pt reports difficulty swallowing onset for several months, pt reports using a straw to drink, pt denies changes in speech, no facial droop present, pt reports cough with clear sputum, pt reports worsening with laying down, pt denies CP, pt denies hx of CHF, pt reports having an aneurysm of the aorta measuring 5 mm, A&O x4

## 2016-09-08 NOTE — Progress Notes (Signed)
Received patient from ED, alert and oriented, ambulatory.No complaints of any pain or discomfort.

## 2016-09-08 NOTE — Telephone Encounter (Signed)
New message   Patient states he is being sent from [facility]ber shop to ER , his legs are swollen the size of elephant feet, and he cant get his breath  Pt c/o swelling: STAT is pt has developed SOB within 24 hours  1. How long have you been experiencing swelling?  A week  2. Where is the swelling located? Legs   3.  Are you currently taking a "fluid pill"? ?  4.  Are you currently SOB? Yes   5.  Have you traveled recently?no

## 2016-09-09 ENCOUNTER — Inpatient Hospital Stay (HOSPITAL_COMMUNITY): Payer: Medicare Other

## 2016-09-09 DIAGNOSIS — R1319 Other dysphagia: Secondary | ICD-10-CM

## 2016-09-09 DIAGNOSIS — I5023 Acute on chronic systolic (congestive) heart failure: Principal | ICD-10-CM

## 2016-09-09 DIAGNOSIS — I350 Nonrheumatic aortic (valve) stenosis: Secondary | ICD-10-CM

## 2016-09-09 LAB — BASIC METABOLIC PANEL
ANION GAP: 10 (ref 5–15)
BUN: 19 mg/dL (ref 6–20)
CO2: 24 mmol/L (ref 22–32)
Calcium: 9 mg/dL (ref 8.9–10.3)
Chloride: 105 mmol/L (ref 101–111)
Creatinine, Ser: 1.17 mg/dL (ref 0.61–1.24)
GFR, EST NON AFRICAN AMERICAN: 60 mL/min — AB (ref >60)
Glucose, Bld: 216 mg/dL — ABNORMAL HIGH (ref 65–99)
POTASSIUM: 4 mmol/L (ref 3.5–5.1)
SODIUM: 139 mmol/L (ref 135–145)

## 2016-09-09 LAB — GLUCOSE, CAPILLARY
GLUCOSE-CAPILLARY: 183 mg/dL — AB (ref 65–99)
Glucose-Capillary: 197 mg/dL — ABNORMAL HIGH (ref 65–99)
Glucose-Capillary: 333 mg/dL — ABNORMAL HIGH (ref 65–99)
Glucose-Capillary: 251 mg/dL — ABNORMAL HIGH (ref 65–99)

## 2016-09-09 LAB — LIPID PANEL
Cholesterol: 166 mg/dL (ref 0–200)
HDL: 35 mg/dL — ABNORMAL LOW (ref >40)
LDL CALC: 100 mg/dL — AB (ref 0–99)
TRIGLYCERIDES: 157 mg/dL — AB (ref <150)
Total CHOL/HDL Ratio: 4.7 RATIO
VLDL: 31 mg/dL (ref 0–40)

## 2016-09-09 LAB — ECHOCARDIOGRAM COMPLETE
HEIGHTINCHES: 70 in
WEIGHTICAEL: 3779.2 [oz_av]

## 2016-09-09 NOTE — Progress Notes (Signed)
Hand off provided to the upcoming Nurse for continuity of care at 11pm. Prescribed medicines provided and assessment done. Pt does not have any complain of pain and SOB so far and sleeping at this moment.

## 2016-09-09 NOTE — Progress Notes (Signed)
*   Echocardiogram 2D Echocardiogram has been performed.  Pieter Partridge 09/09/2016, 11:29 AM

## 2016-09-09 NOTE — Progress Notes (Addendum)
No new orders. Vital signs stable. Patient is resting. Stated that he feels good.   Will continue to monitor.  Marticia Reifschneider, RN

## 2016-09-09 NOTE — Progress Notes (Signed)
PROGRESS NOTE                                                                                                                                                                                                             Patient Demographics:    Alan [name], is a 76 y.o. male, DOB - June 20, 1940, WUJ:[id number]  Admit date - 09/08/2016   Admitting Physician Eddie North, MD  Outpatient Primary MD for the patient is Patient, No Pcp Per  LOS - 1  Outpatient Specialists:Dr. [name] (cardiology)  Chief Complaint  Patient presents with  . 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   Subjective:   Patient reports his breathing to be better since admission. Leg swellings have gone down but he has still not been able to lie down flat.   Assessment  & Plan :    Principal Problem:   Acute On chronic systolic (congestive) heart failure (HCC) Diuresing well with IV Lasix (60 mg every 12 hours). Continue strict I/O and daily weight. Repeat 2-D echo. Holding beta blocker for acute decompensation. Continue aspirin and lisinopril.  Active Problems:   NICM (nonischemic cardiomyopathy) (HCC) Nonreversible ischemia on Lexiscan Myoview in January 2018 with global hypokinesis, most severe in inferior wall with EF of 33%. Cardiology recommended medical management. Continue aspirin and lisinopril. LDL of 100. Resume beta blocker once acute decompensation resolved. Follows with Dr. [name].    Thoracic aortic aneurysm without rupture (HCC) Stable 5.1 cm ascending aortic aneurysm seen on  CT angiogram in January 2018. Recommend semiannual imaging followed by CTA or MRA and referral to cardiothoracic surgery as outpatient.     DM (diabetes mellitus) (HCC) Continue glipizide. Monitor on sliding scale insulin. Pending A1c. Hold metformin.    Thyroid nodule Seen dating last hospitalization. 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 No weight loss. Discussed with oncologist Vira Agar recommends outpatient workup by endocrinologist (either with radioiodine uptake study or  excisional biopsy).  Dysphagia Reports ongoing symptoms for possible weeks with dysphagia to thin liquid. TSH in January was normal. Check esophagogram.  Abdominal distention Secondary to volume overload. LFTs normal. Improving with diuresis.      Code Status : Full code  Family Communication  : None at bedside  Disposition Plan  : Home once improved possibly in the next 48 hours  Barriers For Discharge : Active symptoms  Consults  none  Procedures  : 2-D echo  DVT Prophylaxis  :  Lovenox  Lab Results  Component Value Date   PLT 172 09/08/2016    Antibiotics  :    Anti-infectives    None        Objective:   Vitals:   09/08/16 1822 09/08/16 2027 09/09/16 0138 09/09/16 0428  BP: (!) 166/61 (!) 137/51 (!) 117/42 (!) 138/54  Pulse: 61 64 62 (!) 58  Resp: 20 20 18 20   Temp: 97.9 F (36.6 C) 98.4 F (36.9 C) 98.2 F (36.8 C) 98.8 F (37.1 C)  TempSrc: Oral Oral Oral Oral  SpO2: 97% 95% 93% 97%  Weight: 104.9 kg (231 lb 3 oz)   107.1 kg (236 lb 3.2 oz)  Height: 5\' 10"  (1.778 m)       Wt Readings from Last 3 Encounters:  09/09/16 107.1 kg (236 lb 3.2 oz)  05/25/16 106.4 kg (234 lb 8 oz)  05/10/16 109.1 kg (240 lb 8 oz)     Intake/Output Summary (Last 24 hours) at 09/09/16 1201 Last data filed at 09/09/16 0934  Gross per 24 hour  Intake              530 ml  Output              1650 ml  Net            -1120 ml     Physical Exam  Gen: not in distress HEENT: , moist mucosa, supple neck Chest:Fine bibasilar crackles CVS: N S1&S2, no murmurs, rubs or gallop GI: soft, nontender, mild abdominal distention (improved from yesterday) Musculoskeletal: warm, 1+ pitting edema bilaterally (improved since admission)    Data Review:    CBC  Recent Labs Lab 09/08/16 1235  WBC 6.2  HGB 12.1*  HCT 37.1*  PLT 172  MCV 86.7  MCH 28.3  MCHC 32.6  RDW 14.5  LYMPHSABS 1.6  MONOABS 0.7  EOSABS 0.2  BASOSABS 0.0    Chemistries   Recent Labs Lab 09/08/16 1235 09/09/16 0406  NA 140 139  K 4.3 4.0  CL 110 105  CO2 23 24  GLUCOSE 186* 216*  BUN 15 19  CREATININE 0.92 1.17  CALCIUM 9.2 9.0  AST 36  --   ALT 69*  --   ALKPHOS 47  --   BILITOT 0.7  --    ------------------------------------------------------------------------------------------------------------------  Recent Labs  09/09/16 0406  CHOL 166  HDL 35*  LDLCALC 100*  TRIG 157*  CHOLHDL 4.7    No results found for: HGBA1C ------------------------------------------------------------------------------------------------------------------ No results for input(s): TSH, T4TOTAL, T3FREE, THYROIDAB in the last 72 hours.  Invalid input(s): FREET3 ------------------------------------------------------------------------------------------------------------------ No results for input(s): VITAMINB12, FOLATE, FERRITIN, TIBC, IRON, RETICCTPCT in the last 72 hours.  Coagulation profile No results for input(s): INR, PROTIME in the last 168 hours.  No results for input(s): DDIMER in the last 72 hours.  Cardiac Enzymes No results for input(s): CKMB, TROPONINI, MYOGLOBIN in the last 168 hours.  Invalid input(s): CK ------------------------------------------------------------------------------------------------------------------  Component Value Date/Time   BNP 757.9 (H) 09/08/2016 1717     Inpatient Medications  Scheduled Meds: . aspirin EC  81 mg Oral Daily  . B-complex with vitamin C  1 tablet Oral BID  . cholecalciferol  2,000 Units Oral Daily  . dorzolamide-timolol  1 drop Both Eyes BID  . enoxaparin (LOVENOX) injection  40 mg Subcutaneous Q24H  . furosemide  60 mg Intravenous Q12H  . glipiZIDE  10 mg Oral BID AC  . insulin aspart  0-15 Units Subcutaneous TID WC  . insulin aspart  0-5 Units Subcutaneous QHS  . latanoprost  1 drop Both Eyes QHS  . lisinopril  10 mg Oral Daily  . meclizine  25 mg Oral BID  . sodium chloride flush  3 mL Intravenous Q12H   Continuous Infusions: . sodium chloride     PRN Meds:.sodium chloride, acetaminophen, ondansetron (ZOFRAN) IV, RESOURCE THICKENUP CLEAR, sodium chloride flush  Micro Results No results found for this or any previous visit (from the past 240 hour(s)).  Radiology Reports Dg Chest 2 View  Result Date: 09/08/2016 CLINICAL DATA:  Shortness of breath EXAM: CHEST  2 VIEW COMPARISON:  05/10/2016 FINDINGS: Chronic cardiomegaly. There is vascular pedicle widening, Kerley lines, and fissural thickening. Negative for pleural effusion or air bronchograms. Stable aortic contours. No acute osseous finding IMPRESSION: Mild CHF. Electronically Signed   By: Marnee Spring M.D.   On: 09/08/2016 13:13    Time Spent in minutes  25   Eddie North M.D on 09/09/2016 at 12:01 PM  Between 7am to 7pm - Pager - [phone]  After 7pm go to [email or web address] - password [facility]  [facility] -  Office  [phone]

## 2016-09-09 NOTE — Progress Notes (Signed)
Patient slept mostly during the night. Reported that he slept more than he slept for past few days. Stated that he feels much better this morning, no pain reported. Will contine to monitor.   Glennette Galster, RN

## 2016-09-10 DIAGNOSIS — R1310 Dysphagia, unspecified: Secondary | ICD-10-CM

## 2016-09-10 LAB — BASIC METABOLIC PANEL
Anion gap: 9 (ref 5–15)
BUN: 23 mg/dL — AB (ref 6–20)
CHLORIDE: 104 mmol/L (ref 101–111)
CO2: 26 mmol/L (ref 22–32)
CREATININE: 1.12 mg/dL (ref 0.61–1.24)
Calcium: 8.8 mg/dL — ABNORMAL LOW (ref 8.9–10.3)
GFR calc Af Amer: >60 mL/min (ref >60)
GFR calc non Af Amer: >60 mL/min (ref >60)
Glucose, Bld: 182 mg/dL — ABNORMAL HIGH (ref 65–99)
Potassium: 3.6 mmol/L (ref 3.5–5.1)
SODIUM: 139 mmol/L (ref 135–145)

## 2016-09-10 LAB — HEMOGLOBIN A1C
HEMOGLOBIN A1C: 8.5 % — AB (ref 4.8–5.6)
Mean Plasma Glucose: 197 mg/dL

## 2016-09-10 LAB — GLUCOSE, CAPILLARY
GLUCOSE-CAPILLARY: 177 mg/dL — AB (ref 65–99)
GLUCOSE-CAPILLARY: 287 mg/dL — AB (ref 65–99)
GLUCOSE-CAPILLARY: 163 mg/dL — AB (ref 65–99)
Glucose-Capillary: 352 mg/dL — ABNORMAL HIGH (ref 65–99)

## 2016-09-10 MED ORDER — POTASSIUM CHLORIDE CRYS ER 20 MEQ PO TBCR
40.0000 meq | EXTENDED_RELEASE_TABLET | Freq: Once | ORAL | Status: AC
  Administered 2016-09-10: 40 meq via ORAL
  Filled 2016-09-10: qty 2

## 2016-09-10 MED ORDER — BISACODYL 10 MG RE SUPP
10.0000 mg | Freq: Every day | RECTAL | Status: DC
  Administered 2016-09-10: 10 mg via RECTAL
  Filled 2016-09-10 (×2): qty 1

## 2016-09-10 MED ORDER — SPIRONOLACTONE 25 MG PO TABS
25.0000 mg | ORAL_TABLET | Freq: Every day | ORAL | Status: DC
  Administered 2016-09-10 – 2016-09-11 (×2): 25 mg via ORAL
  Filled 2016-09-10 (×2): qty 1

## 2016-09-10 MED ORDER — SIMVASTATIN 10 MG PO TABS
10.0000 mg | ORAL_TABLET | Freq: Every day | ORAL | Status: DC
  Administered 2016-09-10: 10 mg via ORAL
  Filled 2016-09-10: qty 1

## 2016-09-10 NOTE — Progress Notes (Signed)
Results for OTTIE, LINDENBAUM (MRN [id number]) as of 09/10/2016 13:17  Ref. Range 09/09/2016 11:30 09/09/2016 17:08 09/09/2016 21:12 09/10/2016 07:31 09/10/2016 11:23  Glucose-Capillary Latest Ref Range: 65 - 99 mg/dL 938 (H) 101 (H) 751 (H) 177 (H) 352 (H)  Noted that postprandial blood sugars continue to be greater than 180 mg/dl. Recommend adding Novolog 4-5 units TID as meal coverage if blood sugars continue to be elevated and patient eats at least 50% of meals.   Smith Mince RN BSN CDE Diabetes Coordinator Pager: [phone]  8am-5pm

## 2016-09-10 NOTE — Progress Notes (Signed)
Pt accidentally pulled IV out and refused for IV reinsertion, advised pt that PIV needed since pt has scheduled IV lasix at 06:30. Pt agreed for PIV insertion at 0600.

## 2016-09-10 NOTE — Care Management Note (Addendum)
Case Management Note  Patient Details  Name: Alan [name] MRN: [id number] Date of Birth: [birth year]  Subjective/Objective:     Admitted with CHF              Action/Plan: Patient lives alone in home; PCP is Juluis Rainier, MD; has private insurance with [employer]ilene Rud with prescription drug coverage. CM following for DCP  12:41 pm - CM talked to patient at the bedside, he wants a new PCP and does not plan to see Dr Zachery Dauer again. Patient requested to see Dr Edman Circle [phone]); Apt made for May 25,2018 at 2 pm; B Ave Filter RN  Expected Discharge Date:   possibly 09/11/2016               Expected Discharge Plan:  Home/Self Care  In-House Referral:   [facility]  Discharge planning Services  CM Consult  Status of Service:  In process, will continue to follow  Reola Mosher [phone] 09/10/2016, 11:27 AM

## 2016-09-10 NOTE — Consult Note (Signed)
   [facility] CM Inpatient Consult   09/10/2016  Alan [name] 07-19-1940 [id number]   Patient was referred for post hospital EMMI follow up.  Patient noted in the [facility] registry but stated having no primary care provider.  Patient admitted with Acute HF.  Met with the patient at the bedside.  Patient states that he does no longer have Dr. Leighton Ruff for his primary care provider and although he is a veteran he prefers to get a primary care provider who is good with diabetes.  He states that his friend who just called goes to an Radene Gunning who he believes is a PA who manages his friend's diabetes.Patient was given a [facility] brochure and encouraged to check the website for obtaining a new primary care provider.   Information given to inpatient RNCM.  For questions, please contact:  Natividad Brood, RN BSN [facility] Liaison  [phone] business mobile phone Toll free office [phone]

## 2016-09-10 NOTE — Progress Notes (Signed)
PROGRESS NOTE                                                                                                                                                                                                             Patient Demographics:    Alan [name], is a 76 y.o. male, DOB - 10-14-1940, ZOX:[id number]  Admit date - 09/08/2016   Admitting Physician Eddie North, MD  Outpatient Primary MD for the patient is Patient, No Pcp Per  LOS - 2  Outpatient Specialists:Dr. [name] (cardiology)  Chief Complaint  Patient presents with  . 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   Subjective:   Breathing much improved and able to lie down flat yesterday.    Assessment  & Plan :    Principal Problem:   Acute On chronic systolic (congestive) heart failure (HCC) Diuresing well with IV Lasix (60 mg every 12 hours). Weight down 11 pounds since admission. Continue strict I/O and daily weight. Continue aspirin and lisinopril. Holding beta blocker for acute decompensation. Repeat 2-D echo shows worsened EF of 35-40%. Wall motion abnormality. I will add statin and Aldactone.  Active Problems:   NICM (nonischemic cardiomyopathy) (HCC) Nonreversible ischemia on Lexiscan Myoview in January 2018 with global hypokinesis, most severe in inferior wall with EF of 33%. Cardiology recommended medical management.  Resume beta blocker upon discharge. Add statin and Aldactone. Continue aspirin and lisinopril. Patient should follow-up with Dr. [name] upon discharge.    Thoracic aortic  aneurysm without rupture (HCC) Stable 5.1 cm ascending aortic aneurysm seen on CT angiogram in January 2018. Recommend semiannual imaging followed by CTA or MRA and referral to cardiothoracic surgery as outpatient.      DM (diabetes mellitus), uncontrolled (HCC) Continue glipizide. Holding metformin. A1c of 8.5. Monitor on sliding scale insulin. Will discuss if patient open to long acting insulin.    Thyroid nodule Seen dating last hospitalization. Underwent right thyroid node biopsy in February 2018 showing small group of follicular epithelial cells inconclusive  for follicular neoplasm. No weight loss. Discussed with oncologist Vira Agar recommends outpatient workup by endocrinologist (either with radioiodine uptake study or excisional biopsy).  Dysphagia  dysphagia to thin liquid. TSH in January was normal. Esophagogram negative for aspiration. Suggests presbyesophagus and decreased esophageal motility.   abdominal distention Secondary to volume overload. LFTs normal. Improving with diuresis.      Code Status : Full code  Family Communication  : None at bedside  Disposition Plan  : Home in a.m. if continues to diurese well.  Barriers For Discharge : Active symptoms  Consults  none  Procedures  : 2-D echo  DVT Prophylaxis  :  Lovenox  Lab Results  Component Value Date   PLT 172 09/08/2016    Antibiotics  :    Anti-infectives    None        Objective:   Vitals:   09/09/16 1722 09/09/16 2027 09/10/16 0404 09/10/16 0520  BP: (!) 131/50 (!) 126/44  (!) 123/56  Pulse: (!) 58 62  69  Resp: 18 17  16   Temp: 98.3 F (36.8 C) 98.5 F (36.9 C)  98.4 F (36.9 C)  TempSrc: Oral Oral  Oral  SpO2: 96% 95%  96%  Weight:   104.9 kg (231 lb 3.2 oz)   Height:        Wt Readings from Last 3 Encounters:  09/10/16 104.9 kg (231 lb 3.2 oz)  05/25/16 106.4 kg (234 lb 8 oz)  05/10/16 109.1 kg (240 lb 8 oz)     Intake/Output Summary (Last 24 hours) at 09/10/16 1106 Last  data filed at 09/10/16 0930  Gross per 24 hour  Intake             1260 ml  Output              450 ml  Net              810 ml     Physical Exam Gen.: Elderly male not in distress HEENT: Moist mucosa, supple neck Chest: Fine left basilar crackles CVS: Normal S1 and S2, no murmurs GI: Soft, nontender, abdominal distention resolved Musculoskeletal: Warm, trace bilateral pitting edema    Data Review:    CBC  Recent Labs Lab 09/08/16 1235  WBC 6.2  HGB 12.1*  HCT 37.1*  PLT 172  MCV 86.7  MCH 28.3  MCHC 32.6  RDW 14.5  LYMPHSABS 1.6  MONOABS 0.7  EOSABS 0.2  BASOSABS 0.0    Chemistries   Recent Labs Lab 09/08/16 1235 09/09/16 0406 09/10/16 0511  NA 140 139 139  K 4.3 4.0 3.6  CL 110 105 104  CO2 23 24 26   GLUCOSE 186* 216* 182*  BUN 15 19 23*  CREATININE 0.92 1.17 1.12  CALCIUM 9.2 9.0 8.8*  AST 36  --   --   ALT 69*  --   --   ALKPHOS 47  --   --   BILITOT 0.7  --   --    ------------------------------------------------------------------------------------------------------------------  Recent Labs  09/09/16 0406  CHOL 166  HDL 35*  LDLCALC 100*  TRIG 157*  CHOLHDL 4.7    Lab Results  Component Value Date   HGBA1C 8.5 (H) 09/08/2016   ------------------------------------------------------------------------------------------------------------------ No results for input(s): TSH, T4TOTAL, T3FREE, THYROIDAB in the last 72 hours.  Invalid input(s): FREET3 ------------------------------------------------------------------------------------------------------------------ No results for input(s): VITAMINB12, FOLATE, FERRITIN, TIBC, IRON, RETICCTPCT in the last 72 hours.  Coagulation profile No results for input(s): INR,  PROTIME in the last 168 hours.  No results for input(s): DDIMER in the last 72 hours.  Cardiac Enzymes No results for input(s): CKMB, TROPONINI, MYOGLOBIN in the last 168 hours.  Invalid input(s):  CK ------------------------------------------------------------------------------------------------------------------    Component Value Date/Time   BNP 757.9 (H) 09/08/2016 1717    Inpatient Medications  Scheduled Meds: . aspirin EC  81 mg Oral Daily  . B-complex with vitamin C  1 tablet Oral BID  . cholecalciferol  2,000 Units Oral Daily  . dorzolamide-timolol  1 drop Both Eyes BID  . enoxaparin (LOVENOX) injection  40 mg Subcutaneous Q24H  . furosemide  60 mg Intravenous Q12H  . glipiZIDE  10 mg Oral BID AC  . insulin aspart  0-15 Units Subcutaneous TID WC  . insulin aspart  0-5 Units Subcutaneous QHS  . latanoprost  1 drop Both Eyes QHS  . lisinopril  10 mg Oral Daily  . meclizine  25 mg Oral BID  . sodium chloride flush  3 mL Intravenous Q12H  . spironolactone  25 mg Oral Daily   Continuous Infusions: . sodium chloride     PRN Meds:.sodium chloride, acetaminophen, ondansetron (ZOFRAN) IV, RESOURCE THICKENUP CLEAR, sodium chloride flush  Micro Results No results found for this or any previous visit (from the past 240 hour(s)).  Radiology Reports Dg Chest 2 View  Result Date: 09/08/2016 CLINICAL DATA:  Shortness of breath EXAM: CHEST  2 VIEW COMPARISON:  05/10/2016 FINDINGS: Chronic cardiomegaly. There is vascular pedicle widening, Kerley lines, and fissural thickening. Negative for pleural effusion or air bronchograms. Stable aortic contours. No acute osseous finding IMPRESSION: Mild CHF. Electronically Signed   By: Marnee Spring M.D.   On: 09/08/2016 13:13   Dg Esophagus  Result Date: 09/09/2016 CLINICAL DATA:  76 year old male inpatient with dysphagia to thin liquids, also reports intermittent difficulty handling secretions. He reports occasional regurgitation of some liquids, and also coughing while drinking. EXAM: ESOPHOGRAM / BARIUM SWALLOW / BARIUM TABLET STUDY TECHNIQUE: Combined double contrast and single contrast examination performed using effervescent crystals  and barium liquid. The patient was observed with fluoroscopy swallowing a 13 mm barium sulphate tablet. FLUOROSCOPY TIME:  Fluoroscopy Time:  1 minutes 24 seconds Radiation Exposure Index (if provided by the fluoroscopic device): Number of Acquired Spot Images: 0 COMPARISON:  Chest radiographs 09/08/2016.  Chest CT 05/10/2016 FINDINGS: A double contrast study was undertaken and the patient tolerated this well and without difficulty. Frequent tertiary contractions, and decreased esophageal motility, but no other obstruction to the forward flow of contrast throughout the esophagus and into the stomach. In the absence of tertiary contractions the esophageal course and contour are normal. A 12.5 mm barium tablet was administered and passed freely to the stomach without delay (Series 10). Normal gastroesophageal junction. Direct observation of the cervical esophagus was performed, and the cervical esophagus appears normal. Furthermore, no penetration or aspiration occurred during the exam. IMPRESSION: 1. No aspiration occurred during this exam, and the cervical esophagus appears normal. Still, if coughing with liquids persists or progresses, or if there is a recent history of pneumonia, follow-up Speech Pathology evaluation would be valuable. 2. 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 Electronically Signed   By: Odessa Fleming M.D.   On: 09/09/2016 12:41    Time Spent in minutes  25   Eddie North M.D on 09/10/2016 at 11:06 AM  Between 7am to 7pm - Pager - [phone]  After 7pm go to [email or web address] - password  [facility] -  Office  [phone]

## 2016-09-11 DIAGNOSIS — E1165 Type 2 diabetes mellitus with hyperglycemia: Secondary | ICD-10-CM | POA: Diagnosis present

## 2016-09-11 DIAGNOSIS — I712 Thoracic aortic aneurysm, without rupture: Secondary | ICD-10-CM

## 2016-09-11 DIAGNOSIS — I5022 Chronic systolic (congestive) heart failure: Secondary | ICD-10-CM | POA: Diagnosis present

## 2016-09-11 DIAGNOSIS — K224 Dyskinesia of esophagus: Secondary | ICD-10-CM | POA: Diagnosis present

## 2016-09-11 LAB — BASIC METABOLIC PANEL
Anion gap: 8 (ref 5–15)
BUN: 24 mg/dL — AB (ref 6–20)
CHLORIDE: 102 mmol/L (ref 101–111)
CO2: 26 mmol/L (ref 22–32)
Calcium: 9.1 mg/dL (ref 8.9–10.3)
Creatinine, Ser: 1.24 mg/dL (ref 0.61–1.24)
GFR calc Af Amer: >60 mL/min (ref >60)
GFR calc non Af Amer: 56 mL/min — ABNORMAL LOW (ref >60)
Glucose, Bld: 290 mg/dL — ABNORMAL HIGH (ref 65–99)
POTASSIUM: 4 mmol/L (ref 3.5–5.1)
SODIUM: 136 mmol/L (ref 135–145)

## 2016-09-11 LAB — GLUCOSE, CAPILLARY: Glucose-Capillary: 249 mg/dL — ABNORMAL HIGH (ref 65–99)

## 2016-09-11 MED ORDER — POTASSIUM CHLORIDE ER 20 MEQ PO TBCR: 20.0000 meq | EXTENDED_RELEASE_TABLET | Freq: Every day | ORAL | 0 refills | Status: DC

## 2016-09-11 MED ORDER — SIMVASTATIN 10 MG PO TABS: 10.0000 mg | ORAL_TABLET | Freq: Every day | ORAL | 0 refills | Status: DC

## 2016-09-11 MED ORDER — FUROSEMIDE 40 MG PO TABS: 40.0000 mg | ORAL_TABLET | Freq: Two times a day (BID) | ORAL | 0 refills | Status: DC

## 2016-09-11 MED ORDER — SPIRONOLACTONE 25 MG PO TABS: 25.0000 mg | ORAL_TABLET | Freq: Every day | ORAL | 0 refills | Status: AC

## 2016-09-11 NOTE — Progress Notes (Signed)
Pt got discharged to home, discharge instructions provided and patient showed understanding to it, IV taken out,Telemonitor DC,pt left unit in wheelchair with all of the belongings accompanied with a family member (wife) 

## 2016-09-11 NOTE — Discharge Summary (Signed)
Physician Discharge Summary  Alan [name] ZOX:[id number] DOB: 1940/06/18 DOA: 09/08/2016  PCP: Dr Everlene Other Admit date: 09/08/2016 Discharge date: 09/11/2016  Admitted From: Home Disposition: Home  Recommendations for Outpatient Follow-up:  1. Follow up with PCP Dr. Everlene Other on 09/17/16 at 2 PM. 2. 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 3. Please discuss starting patient on insulin for his uncontrolled diabetes. 4. Patient has follow-up with cardiology on 6/6. 5. Patient will be followed by cardiothoracic surgery for his ascending aortic aneurysm.  Home Health: RN Equipment/Devices: None  Discharge Condition: Fair CODE STATUS: Full code Diet recommendation: Heart Healthy / Carb Modified   Discharge Diagnoses:  Principal Problem:   Acute on chronic systolic CHF (congestive heart failure) (HCC)   Active Problems:   Thyroid nodule   Uncontrolled type 2 diabetes mellitus with hyperglycemia, without long-term current use of insulin (HCC)   NICM (nonischemic cardiomyopathy) (HCC)   Thoracic aortic aneurysm without rupture (HCC)   Esophageal dysmotility  Brief narrative/history of present illness 76 year old male with nonischemic cardiomyopathy (EF of 45-50% on echo and 33% on stress test back in January 2018), global hypokinesis on stress test being managed medically, ascending aortic aneurysm, thyroid nodule who presented with progressive dyspnea associated with orthopnea and PND for past 8-9 days with findings of acute on chronic systolic CHF.  Hospital course   Principal Problem:   Acute On chronic systolic (congestive) heart failure (HCC) Diuresing well with IV Lasix (60 mg every 12 hours). Weight down 15 pounds since admission. Orthopnea and PND resolved and patient able to ambulate better without getting dyspneic. -Repeat 2-D echo shows worsened LVEF of 35-40%. Has akinesis of basal and mid inferior  lateral and anterolateral walls.  -Continue aspirin and lisinopril. Resume beta blocker upon discharge. Adding statin (LDL of 100) and Aldactone. Patient will be discharged on oral Lasix 40 mg twice a day. Also added potassium supplement.  Patient stable to be discharged home. We will arrange home health RN. Provided he shows monitoring his diet, medication adherence and monitoring daily weight.   Active Problems:   NICM (nonischemic cardiomyopathy) (HCC) Nonreversible ischemia on Lexiscan Myoview in January 2018 with global hypokinesis, most severe in inferior wall with EF of 33%. Cardiology had recommended medical management.  Resumed beta blocker upon discharge. I have added statin and Aldactone. Will be discharged on Lasix as well. Continue aspirin and lisinopril. Patient has follow-up with cardiology in 3 weeks.    Thoracic aortic aneurysm without rupture (HCC) Stable 5.1 cm ascending aortic aneurysm seen on CT angiogram in January 2018. Recommend semiannual imaging followed by CTA or MRA and referral to cardiothoracic surgery as outpatient.  I have spoken with cardiothoracic surgeon Dr. Morton Peters and he will arrange for outpatient evaluation.     DM (diabetes mellitus), uncontrolled with hyperglycemia  (HCC) A1c of 8.5. Patient is already on optimal dose of glipizide and metformin. Discussed starting insulin and he would like to discuss with his new primary care provider before deciding on it. Provided resource on meal planning and monitoring his blood glucose closely.    Thyroid nodule Seen dating last hospitalization. 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 No weight loss. Discussed with oncologist Vira Agar recommends outpatient workup by endocrinologist (either with radioiodine uptake study or excisional biopsy). Patient will need outpatient referral to  endocrinology.  Dysphagia   patient informs having dysphagia to thin  liquid. TSH in January was normal. Esophagogram negative for aspiration. Suggests presbyesophagus and decreased esophageal motility. He has been able to tolerate regular diet without difficulty. Recommend outpatient swallow evaluation if symptoms recur or persist.   abdominal distention Secondary to volume overload. LFTs normal. Resolved with diuresis.     Family Communication  : None at bedside  Disposition Plan  : Home   Consults  none  Procedures  : 2-D echo   Discharge Instructions   Allergies as of 09/11/2016      Reactions   Avandia [rosiglitazone] Other (See Comments)   Caused heart issues   Metformin And Related Other (See Comments)   Vertigo and possible sleeplessness??      Medication List    TAKE these medications   aspirin 81 MG tablet Take 81 mg by mouth daily.   b complex vitamins tablet Take 1 tablet by mouth 2 (two) times daily.   dorzolamide-timolol 22.3-6.8 MG/ML ophthalmic solution Commonly known as:  COSOPT Place 1 drop into both eyes 2 (two) times daily.   furosemide 40 MG tablet Commonly known as:  LASIX Take 1 tablet (40 mg total) by mouth 2 (two) times daily.   glipiZIDE 10 MG tablet Commonly known as:  GLUCOTROL Take 10 mg by mouth 2 (two) times daily before a meal.   latanoprost 0.005 % ophthalmic solution Commonly known as:  XALATAN Place 1 drop into both eyes at bedtime.   lisinopril 10 MG tablet Commonly known as:  PRINIVIL,ZESTRIL Take 1 tablet (10 mg total) by mouth daily. What changed:  when to take this   meclizine 25 MG tablet Commonly known as:  ANTIVERT Take 25 mg by mouth 2 (two) times daily.   metFORMIN 850 MG tablet Commonly known as:  GLUCOPHAGE Take 1,275 mg by mouth 2 (two) times daily with a meal.   metoprolol succinate 50 MG 24 hr tablet Commonly known as:  TOPROL XL Take 1 tablet (50 mg total) by mouth daily. What changed:   when to take this   Potassium Chloride ER 20 MEQ Tbcr Take 20 mEq by mouth daily.   simvastatin 10 MG tablet Commonly known as:  ZOCOR Take 1 tablet (10 mg total) by mouth daily at 6 PM.   spironolactone 25 MG tablet Commonly known as:  ALDACTONE Take 1 tablet (25 mg total) by mouth daily. Start taking on:  09/12/2016   Vitamin D 2000 units Caps Take 2,000 Units by mouth daily.      Follow-up Information    Tracey Harries, MD Follow up on 09/17/2016.   Specialty:  Family Medicine Why:  at 2 pm; please try to keep your apt or call to reschedule Contact information: [id number] [place] [address] [phone]        [name] T, PA-C Follow up on 09/29/2016.   Specialties:  Cardiology, Physician Assistant Why:  9:15 am Contact information: [address] [place] [address] [phone]        Donata Clay, Theron Arista, MD Follow up in 4 week(s).   Specialty:  Cardiothoracic Surgery Why:  office will call Contact information: [address] [place] [address] [phone]          Allergies  Allergen Reactions  . Avandia [Rosiglitazone] Other (See Comments)    Caused heart issues  . Metformin And Related Other (See Comments)    Vertigo and possible sleeplessness??        Procedures/Studies: Dg Chest  2 View  Result Date: 09/08/2016 CLINICAL DATA:  Shortness of breath EXAM: CHEST  2 VIEW COMPARISON:  05/10/2016 FINDINGS: Chronic cardiomegaly. There is vascular pedicle widening, Kerley lines, and fissural thickening. Negative for pleural effusion or air bronchograms. Stable aortic contours. No acute osseous finding IMPRESSION: Mild CHF. Electronically Signed   By: Marnee Spring M.D.   On: 09/08/2016 13:13   Dg Esophagus  Result Date: 09/09/2016 CLINICAL DATA:  76 year old male inpatient with dysphagia to thin liquids, also reports intermittent difficulty handling secretions. He reports occasional regurgitation  of some liquids, and also coughing while drinking. EXAM: ESOPHOGRAM / BARIUM SWALLOW / BARIUM TABLET STUDY TECHNIQUE: Combined double contrast and single contrast examination performed using effervescent crystals and barium liquid. The patient was observed with fluoroscopy swallowing a 13 mm barium sulphate tablet. FLUOROSCOPY TIME:  Fluoroscopy Time:  1 minutes 24 seconds Radiation Exposure Index (if provided by the fluoroscopic device): Number of Acquired Spot Images: 0 COMPARISON:  Chest radiographs 09/08/2016.  Chest CT 05/10/2016 FINDINGS: A double contrast study was undertaken and the patient tolerated this well and without difficulty. Frequent tertiary contractions, and decreased esophageal motility, but no other obstruction to the forward flow of contrast throughout the esophagus and into the stomach. In the absence of tertiary contractions the esophageal course and contour are normal. A 12.5 mm barium tablet was administered and passed freely to the stomach without delay (Series 10). Normal gastroesophageal junction. Direct observation of the cervical esophagus was performed, and the cervical esophagus appears normal. Furthermore, no penetration or aspiration occurred during the exam. IMPRESSION: 1. No aspiration occurred during this exam, and the cervical esophagus appears normal. Still, if coughing with liquids persists or progresses, or if there is a recent history of pneumonia, follow-up Speech Pathology evaluation would be valuable. 2. 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 Electronically Signed   By: Odessa Fleming M.D.   On: 09/09/2016 12:41    2-D echo Study Conclusions  - Left ventricle: The cavity size was moderately dilated. There was   moderate concentric hypertrophy. Systolic function was moderately   reduced. The estimated ejection fraction was in the range of 35%   to 40%. Wall motion was normal; there were no  regional wall   motion abnormalities. Doppler parameters are consistent with   elevated ventricular end-diastolic filling pressure. - Aortic valve: There was moderate regurgitation. - Aortic root: The aortic root was normal in size. - Mitral valve: Structurally normal valve. There was moderate   regurgitation. - Left atrium: The atrium was moderately dilated. - Right ventricle: The cavity size was normal. Wall thickness was   normal. Systolic function was normal. - Tricuspid valve: There was no regurgitation. - Pulmonic valve: There was mild regurgitation. - Pulmonary arteries: Systolic pressure could not be accurately   estimated. - Inferior vena cava: The vessel was normal in size. - Pericardium, extracardiac: There was no pericardial effusion.  Impressions:  - There is akinesis of the basal and mid inferolateral,   anterolateral walls, LVEF 35-40%.   Subjective: Shortness of breath much improved.  Discharge Exam: Vitals:   09/10/16 2015 09/11/16 0326  BP: (!) 131/55 (!) 133/58  Pulse: 67 78  Resp: 18 18  Temp: 98.4 F (36.9 C) 98.3 F (36.8 C)   Vitals:   09/10/16 0520 09/10/16 1144 09/10/16 2015 09/11/16 0326  BP: (!) 123/56 (!) 116/51 (!) 131/55 (!) 133/58  Pulse: 69 64 67 78  Resp: 16 18 18  18  Temp: 98.4 F (36.9 C) 98.5 F (36.9 C) 98.4 F (36.9 C) 98.3 F (36.8 C)  TempSrc: Oral Oral Oral Oral  SpO2: 96% 96% 97% 96%  Weight:    103.1 kg (227 lb 4.8 oz)  Height:        Gen.: Elderly male not in distress HEENT: Moist mucosa, supple neck Chest: Clear breath sounds bilaterally CVS: Normal S1 and S2, no murmurs GI: Soft, nontender, abdominal distention resolved Musculoskeletal: Warm, trace bilateral pitting edema (improved)     The results of significant diagnostics from this hospitalization (including imaging, microbiology, ancillary and laboratory) are listed below for reference.     Microbiology: No results found for this or any previous  visit (from the past 240 hour(s)).   Labs: BNP (last 3 results)  Recent Labs  09/08/16 1717  BNP 757.9*   Basic Metabolic Panel:  Recent Labs Lab 09/08/16 1235 09/09/16 0406 09/10/16 0511 09/11/16 0351  NA 140 139 139 136  K 4.3 4.0 3.6 4.0  CL 110 105 104 102  CO2 23 24 26 26   GLUCOSE 186* 216* 182* 290*  BUN 15 19 23* 24*  CREATININE 0.92 1.17 1.12 1.24  CALCIUM 9.2 9.0 8.8* 9.1   Liver Function Tests:  Recent Labs Lab 09/08/16 1235  AST 36  ALT 69*  ALKPHOS 47  BILITOT 0.7  PROT 6.3*  ALBUMIN 3.8   No results for input(s): LIPASE, AMYLASE in the last 168 hours. No results for input(s): AMMONIA in the last 168 hours. CBC:  Recent Labs Lab 09/08/16 1235  WBC 6.2  NEUTROABS 3.7  HGB 12.1*  HCT 37.1*  MCV 86.7  PLT 172   Cardiac Enzymes: No results for input(s): CKTOTAL, CKMB, CKMBINDEX, TROPONINI in the last 168 hours. BNP: Invalid input(s): POCBNP CBG:  Recent Labs Lab 09/10/16 0731 09/10/16 1123 09/10/16 1612 09/10/16 2106 09/11/16 0836  GLUCAP 177* 352* 287* 163* 249*   D-Dimer No results for input(s): DDIMER in the last 72 hours. Hgb A1c  Recent Labs  09/08/16 2016  HGBA1C 8.5*   Lipid Profile  Recent Labs  09/09/16 0406  CHOL 166  HDL 35*  LDLCALC 100*  TRIG 157*  CHOLHDL 4.7   Thyroid function studies No results for input(s): TSH, T4TOTAL, T3FREE, THYROIDAB in the last 72 hours.  Invalid input(s): FREET3 Anemia work up No results for input(s): VITAMINB12, FOLATE, FERRITIN, TIBC, IRON, RETICCTPCT in the last 72 hours. Urinalysis    Component Value Date/Time   COLORURINE YELLOW 02/16/2016 0431   APPEARANCEUR CLEAR 02/16/2016 0431   LABSPEC 1.020 02/16/2016 0431   PHURINE 5.0 02/16/2016 0431   GLUCOSEU NEGATIVE 02/16/2016 0431   HGBUR NEGATIVE 02/16/2016 0431   BILIRUBINUR NEGATIVE 02/16/2016 0431   KETONESUR NEGATIVE 02/16/2016 0431   PROTEINUR NEGATIVE 02/16/2016 0431   NITRITE NEGATIVE 02/16/2016 0431    LEUKOCYTESUR NEGATIVE 02/16/2016 0431   Sepsis Labs Invalid input(s): PROCALCITONIN,  WBC,  LACTICIDVEN Microbiology No results found for this or any previous visit (from the past 240 hour(s)).   Time coordinating discharge: Over 30 minutes  SIGNED:   Eddie North, MD  [facility] 09/11/2016, 9:48 AM Pager   If 7PM-7AM, please contact night-coverage [email or web address] Password [email or web address]

## 2016-09-11 NOTE — Discharge Instructions (Signed)
Heart Failure °Heart failure is a condition in which the heart has trouble pumping blood because it has become weak or stiff. This means that the heart does not pump blood efficiently for the body to work well. For some people with heart failure, fluid may back up into the lungs and there may be swelling (edema) in the lower legs. Heart failure is usually a long-term (chronic) condition. It is important for you to take good care of yourself and follow the treatment plan from your health care provider. °What are the causes? °This condition is caused by some health problems, including: °· High blood pressure (hypertension). Hypertension causes the heart muscle to work harder than normal. High blood pressure eventually causes the heart to become stiff and weak. °· Coronary artery disease (CAD). CAD is the buildup of cholesterol and fat (plaques) in the arteries of the heart. °· Heart attack (myocardial infarction). Injured tissue, which is caused by the heart attack, does not contract as well and the heart's ability to pump blood is weakened. °· Abnormal heart valves. When the heart valves do not open and close properly, the heart muscle must pump harder to keep the blood flowing. °· Heart muscle disease (cardiomyopathy or myocarditis). Heart muscle disease is damage to the heart muscle from a variety of causes, such as drug or alcohol abuse, infections, or unknown causes. These can increase the risk of heart failure. °· Lung disease. When the lungs do not work properly, the heart must work harder. °What increases the risk? °Risk of heart failure increases as a person ages. This condition is also more likely to develop in people who: °· Are overweight. °· Are male. °· Smoke or chew tobacco. °· Abuse alcohol or illegal drugs. °· Have taken medicines that can damage the heart, such as chemotherapy drugs. °· Have diabetes. °¨ High blood sugar (glucose) is associated with high fat (lipid) levels in the blood. °¨ Diabetes  can also damage tiny blood vessels that carry nutrients to the heart muscle. °· Have abnormal heart rhythms. °· Have thyroid problems. °· Have low blood counts (anemia). °What are the signs or symptoms? °Symptoms of this condition include: °· Shortness of breath with activity, such as when climbing stairs. °· Persistent cough. °· Swelling of the feet, ankles, legs, or abdomen. °· Unexplained weight gain. °· Difficulty breathing when lying flat (orthopnea). °· Waking from sleep because of the need to sit up and get more air. °· Rapid heartbeat. °· Fatigue and loss of energy. °· Feeling light-headed, dizzy, or close to fainting. °· Loss of appetite. °· Nausea. °· Increased urination during the night (nocturia). °· Confusion. °How is this diagnosed? °This condition is diagnosed based on: °· Medical history, symptoms, and a physical exam. °· Diagnostic tests, which may include: °¨ Echocardiogram. °¨ Electrocardiogram (ECG). °¨ Chest X-ray. °¨ Blood tests. °¨ Exercise stress test. °¨ Radionuclide scans. °¨ Cardiac catheterization and angiogram. °How is this treated? °Treatment for this condition is aimed at managing the symptoms of heart failure. Medicines, behavioral changes, or other treatments may be necessary to treat heart failure. °Medicines  °These may include: °· Angiotensin-converting enzyme (ACE) inhibitors. This type of medicine blocks the effects of a blood protein called angiotensin-converting enzyme. ACE inhibitors relax (dilate) the blood vessels and help to lower blood pressure. °· Angiotensin receptor blockers (ARBs). This type of medicine blocks the actions of a blood protein called angiotensin. ARBs dilate the blood vessels and help to lower blood pressure. °· Water pills (diuretics). Diuretics   cause the kidneys to remove salt and water from the blood. The extra fluid is removed through urination, leaving a lower volume of blood that the heart has to pump.  Beta blockers. These improve heart muscle  strength and they prevent the heart from beating too quickly.  Digoxin. This increases the force of the heartbeat. Healthy behavior changes  These may include:  Reaching and maintaining a healthy weight.  Stopping smoking or chewing tobacco.  Eating heart-healthy foods.  Limiting or avoiding alcohol.  Stopping use of street drugs (illegal drugs).  Physical activity. Other treatments  These may include:  Surgery to open blocked coronary arteries or repair damaged heart valves.  Placement of a biventricular pacemaker to improve heart muscle function (cardiac resynchronization therapy). This device paces both the right ventricle and left ventricle.  Placement of a device to treat serious abnormal heart rhythms (implantable cardioverter defibrillator, or ICD).  Placement of a device to improve the pumping ability of the heart (left ventricular assist device, or LVAD).  Heart transplant. This can cure heart failure, and it is considered for certain patients who do not improve with other therapies. Follow these instructions at home: Medicines   Take over-the-counter and prescription medicines only as told by your health care provider. Medicines are important in reducing the workload of your heart, slowing the progression of heart failure, and improving your symptoms.  Do not stop taking your medicine unless your health care provider told you to do that.  Do not skip any dose of medicine.  Refill your prescriptions before you run out of medicine. You need your medicines every day. Eating and drinking    Eat heart-healthy foods. Talk with a dietitian to make an eating plan that is right for you.  Choose foods that contain no trans fat and are low in saturated fat and cholesterol. Healthy choices include fresh or frozen fruits and vegetables, fish, lean meats, legumes, fat-free or low-fat dairy products, and whole-grain or high-fiber foods.  Limit salt (sodium) if directed by  your health care provider. Sodium restriction may reduce symptoms of heart failure. Ask a dietitian to recommend heart-healthy seasonings.  Use healthy cooking methods instead of frying. Healthy methods include roasting, grilling, broiling, baking, poaching, steaming, and stir-frying.  Limit your fluid intake if directed by your health care provider. Fluid restriction may reduce symptoms of heart failure. Lifestyle   Stop smoking or using chewing tobacco. Nicotine and tobacco can damage your heart and your blood vessels. Do not use nicotine gum or patches before talking to your health care provider.  Limit alcohol intake to no more than 1 drink per day for non-pregnant women and 2 drinks per day for men. One drink equals 12 oz of beer, 5 oz of wine, or 1 oz of hard liquor.  Drinking more than that is harmful to your heart. Tell your health care provider if you drink alcohol several times a week.  Talk with your health care provider about whether any level of alcohol use is safe for you.  If your heart has already been damaged by alcohol or you have severe heart failure, drinking alcohol should be stopped completely.  Stop use of illegal drugs.  Lose weight if directed by your health care provider. Weight loss may reduce symptoms of heart failure.  Do moderate physical activity if directed by your health care provider. People who are elderly and people with severe heart failure should consult with a health care provider for physical activity recommendations. Monitor  important information   Weigh yourself every day. Keeping track of your weight daily helps you to notice excess fluid sooner.  Weigh yourself every morning after you urinate and before you eat breakfast.  Wear the same amount of clothing each time you weigh yourself.  Record your daily weight. Provide your health care provider with your weight record.  Monitor and record your blood pressure as told by your health care  provider.  Check your pulse as told by your health care provider. Dealing with extreme temperatures   If the weather is extremely hot:  Avoid vigorous physical activity.  Use air conditioning or fans or seek a cooler location.  Avoid caffeine and alcohol.  Wear loose-fitting, lightweight, and light-colored clothing.  If the weather is extremely cold:  Avoid vigorous physical activity.  Layer your clothes.  Wear mittens or gloves, a hat, and a scarf when you go outside.  Avoid alcohol. General instructions   Manage other health conditions such as hypertension, diabetes, thyroid disease, or abnormal heart rhythms as told by your health care provider.  Learn to manage stress. If you need help to do this, ask your health care provider.  Plan rest periods when fatigued.  Get ongoing education and support as needed.  Participate in or seek rehabilitation as needed to maintain or improve independence and quality of life.  Stay up to date with immunizations. Keeping current on pneumococcal and influenza immunizations is especially important to prevent respiratory infections.  Keep all follow-up visits as told by your health care provider. This is important. Contact a health care provider if:  You have a rapid weight gain.  You have increasing shortness of breath that is unusual for you.  You are unable to participate in your usual physical activities.  You tire easily.  You cough more than normal, especially with physical activity.  You have any swelling or more swelling in areas such as your hands, feet, ankles, or abdomen.  You are unable to sleep because it is hard to breathe.  You feel like your heart is beating quickly (palpitations).  You become dizzy or light-headed when you stand up. Get help right away if:  You have difficulty breathing.  You notice or your family notices a change in your awareness, such as having trouble staying awake or having  difficulty with concentration.  You have pain or discomfort in your chest.  You have an episode of fainting (syncope). This information is not intended to replace advice given to you by your health care provider. Make sure you discuss any questions you have with your health care provider. Document Released: 04/12/2005 Document Revised: 12/16/2015 Document Reviewed: 11/05/2015 Elsevier Interactive Patient Education  2017 [employer]   Diabetes Mellitus and Food It is important for you to manage your blood sugar (glucose) level. Your blood glucose level can be greatly affected by what you eat. Eating healthier foods in the appropriate amounts throughout the day at about the same time each day will help you control your blood glucose level. It can also help slow or prevent worsening of your diabetes mellitus. Healthy eating may even help you improve the level of your blood pressure and reach or maintain a healthy weight. General recommendations for healthful eating and cooking habits include:  Eating meals and snacks regularly. Avoid going long periods of time without eating to lose weight.  Eating a diet that consists mainly of plant-based foods, such as fruits, vegetables, nuts, legumes, and whole grains.  Using low-heat  cooking methods, such as baking, instead of high-heat cooking methods, such as deep frying. Work with your dietitian to make sure you understand how to use the Nutrition Facts information on food labels. How can food affect me? Carbohydrates  Carbohydrates affect your blood glucose level more than any other type of food. Your dietitian will help you determine how many carbohydrates to eat at each meal and teach you how to count carbohydrates. Counting carbohydrates is important to keep your blood glucose at a healthy level, especially if you are using insulin or taking certain medicines for diabetes mellitus. Alcohol  Alcohol can cause sudden decreases in blood glucose  (hypoglycemia), especially if you use insulin or take certain medicines for diabetes mellitus. Hypoglycemia can be a life-threatening condition. Symptoms of hypoglycemia (sleepiness, dizziness, and disorientation) are similar to symptoms of having too much alcohol. If your health care provider has given you approval to drink alcohol, do so in moderation and use the following guidelines:  Women should not have more than one drink per day, and men should not have more than two drinks per day. One drink is equal to:  12 oz of beer.  5 oz of wine.  1 oz of hard liquor.  Do not drink on an empty stomach.  Keep yourself hydrated. Have water, diet soda, or unsweetened iced tea.  Regular soda, juice, and other mixers might contain a lot of carbohydrates and should be counted. What foods are not recommended? As you make food choices, it is important to remember that all foods are not the same. Some foods have fewer nutrients per serving than other foods, even though they might have the same number of calories or carbohydrates. It is difficult to get your body what it needs when you eat foods with fewer nutrients. Examples of foods that you should avoid that are high in calories and carbohydrates but low in nutrients include:  Trans fats (most processed foods list trans fats on the Nutrition Facts label).  Regular soda.  Juice.  Candy.  Sweets, such as cake, pie, doughnuts, and cookies.  Fried foods. What foods can I eat? Eat nutrient-rich foods, which will nourish your body and keep you healthy. The food you should eat also will depend on several factors, including:  The calories you need.  The medicines you take.  Your weight.  Your blood glucose level.  Your blood pressure level.  Your cholesterol level. You should eat a variety of foods, including:  Protein.  Lean cuts of meat.  Proteins low in saturated fats, such as fish, egg whites, and beans. Avoid processed  meats.  Fruits and vegetables.  Fruits and vegetables that may help control blood glucose levels, such as apples, mangoes, and yams.  Dairy products.  Choose fat-free or low-fat dairy products, such as milk, yogurt, and cheese.  Grains, bread, pasta, and rice.  Choose whole grain products, such as multigrain bread, whole oats, and brown rice. These foods may help control blood pressure.  Fats.  Foods containing healthful fats, such as nuts, avocado, olive oil, canola oil, and fish. Does everyone with diabetes mellitus have the same meal plan? Because every person with diabetes mellitus is different, there is not one meal plan that works for everyone. It is very important that you meet with a dietitian who will help you create a meal plan that is just right for you. This information is not intended to replace advice given to you by your health care provider. Make sure you  discuss any questions you have with your health care provider. Document Released: 01/07/2005 Document Revised: 09/18/2015 Document Reviewed: 03/09/2013 Elsevier Interactive Patient Education  2017 [employer].

## 2016-09-21 ENCOUNTER — Other Ambulatory Visit: Payer: Self-pay

## 2016-09-21 NOTE — Patient Outreach (Signed)
[facility]) Care Management  09/21/2016  Alan [name] 1940/11/25 [id number]   Telephone Screen  Referral Date: 09/21/16 Referral Source: Nurse Call Center Report Referral Reason: "caller states her husband was recently dx with HF, discharged from hospital on 09/11/16, SOB worse today and leg cramping causes him not to be able to walk, legs not swollen, he is on diuretics" Insurance:   Outreach attempt # 1 to patient. Spoke with patient. Reviewed report called in to 24 hr nurse call line. Patient states that he has since been to the hospital since then. Noted in records he went to [facility] ER was seen and treated for pulmonary nodule and hyperkalemia. Patient declined to discuss and talk with nurse further stating he was "fine and feeling a little better" then he preceded to hang up the phone.     Plan: RN CM will notify [facility] administrative assistant of case status.   Antionette Fairy, RN,BSN,CCM [facility] Care Management Telephonic Care Management Coordinator Direct Phone: [phone] Toll Free: [phone] Fax: [phone]

## 2016-09-29 ENCOUNTER — Encounter: Payer: Self-pay | Admitting: Physician Assistant

## 2016-09-29 ENCOUNTER — Ambulatory Visit (INDEPENDENT_AMBULATORY_CARE_PROVIDER_SITE_OTHER): Payer: Medicare Other | Admitting: Physician Assistant

## 2016-09-29 VITALS — BP 138/52 | HR 61 | Ht 70.0 in | Wt 235.4 lb

## 2016-09-29 DIAGNOSIS — G4700 Insomnia, unspecified: Secondary | ICD-10-CM | POA: Diagnosis not present

## 2016-09-29 DIAGNOSIS — I351 Nonrheumatic aortic (valve) insufficiency: Secondary | ICD-10-CM

## 2016-09-29 DIAGNOSIS — I712 Thoracic aortic aneurysm, without rupture, unspecified: Secondary | ICD-10-CM

## 2016-09-29 DIAGNOSIS — I10 Essential (primary) hypertension: Secondary | ICD-10-CM | POA: Diagnosis not present

## 2016-09-29 DIAGNOSIS — E041 Nontoxic single thyroid nodule: Secondary | ICD-10-CM | POA: Diagnosis not present

## 2016-09-29 DIAGNOSIS — I5022 Chronic systolic (congestive) heart failure: Secondary | ICD-10-CM | POA: Diagnosis not present

## 2016-09-29 DIAGNOSIS — I428 Other cardiomyopathies: Secondary | ICD-10-CM

## 2016-09-29 DIAGNOSIS — I200 Unstable angina: Secondary | ICD-10-CM

## 2016-09-29 LAB — BASIC METABOLIC PANEL
BUN/Creatinine Ratio: 21 (ref 10–24)
BUN: 25 mg/dL (ref 8–27)
CALCIUM: 9.5 mg/dL (ref 8.6–10.2)
CHLORIDE: 100 mmol/L (ref 96–106)
CO2: 24 mmol/L (ref 18–29)
Creatinine, Ser: 1.19 mg/dL (ref 0.76–1.27)
GFR calc non Af Amer: 60 mL/min/{1.73_m2} (ref >59)
GFR, EST AFRICAN AMERICAN: 69 mL/min/{1.73_m2} (ref >59)
GLUCOSE: 157 mg/dL — AB (ref 65–99)
POTASSIUM: 4.8 mmol/L (ref 3.5–5.2)
Sodium: 138 mmol/L (ref 134–144)

## 2016-09-29 MED ORDER — LOSARTAN POTASSIUM 25 MG PO TABS: 25.0000 mg | ORAL_TABLET | Freq: Every day | ORAL | 3 refills | Status: DC

## 2016-09-29 NOTE — Progress Notes (Signed)
Cardiology Office Note:    Date:  09/29/2016   ID:  Ruven Corradi, DOB Nov 13, 1940, MRN [id number]  PCP:  Macy Mis, MD  Cardiologist:  Dr. [name]    Referring MD: No ref. provider found   Chief Complaint  Patient presents with  . Hospitalization Follow-up    Admitted with CHF    History of Present Illness:    Alan [name] is a 76 y.o. male with a hx of NICM, ascending thoracic aortic aneurysm, mod AI, diabetes, HTN, prostate CA, thyroid nodule.  EF has improved in the past.  Echo in 2016 demonstrated EF 40%. Cardiac catheterization in 2014 demonstrated no significant CAD. CT angiogram in 04/2016 demonstrated stable ascending aortic aneurysm 5.1 cm. He was last seen by Dr. [name] in 10/2015.  He was admitted in 1/18 with chest pain. Myoview demonstrated no ischemia. EF was 45-50% by echocardiogram. CHF medications were adjusted. He was recently admitted 5/16-5/19 with acute on chronic systolic CHF. Echo demonstrated EF 35-40%. He was diuresed.  He was not seen by Cardiology. Of note, follow-up was to be arranged with [facility] to manage his aneurysm.  He was then seen in the emergency room at [facility] in [place] 09/17/16. He complained of shortness of breath and labs indicated that he was dehydrated (K 5.6, creatinine 1.65, BUN 60). His d-dimer was elevated. Chest CT was negative for pulmonary embolism. BNP was 566. Troponin T was 0.015. He was given IV fluids. His potassium was decreased and his oral furosemide was decreased to 20 mg twice a day.  Mr. Musial returns for post hospital follow up.  He is here today with his friend. His biggest complaint is that of insomnia. He tells me he's had bad dreams since he started on all his medications for his heart. In the last 6 weeks, he has been unable to sleep through the night. He describes having panic attacks. He denies chest pain. He does feel his breathing is improved. He still gets short of breath at times. He denies  syncope. He denies orthopnea, PND or edema.  Prior CV studies:   The following studies were reviewed today:  Echo 09/09/16 Moderate concentric LVH, EF 35-40, inferolateral and anterolateral AK, normal wall motion, moderate AI, moderate MR, moderate LAE, mild PI  Chest CTA 05/10/16 IMPRESSION: 1. 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 Ascending thoracic aortic aneurysm. Recommend continued semi-annual imaging followup by CTA or MRA and referral to cardiothoracic surgery if not already obtained. This recommendation follows 2010 ACCF/AHA/AATS/ACR/ASA/SCA/SCAI/SIR/STS/SVM Guidelines for the Diagnosis and Management of Patients With Thoracic Aortic Disease. Circulation. 2010; 121: N829-F621. 2. Mild cardiomegaly and mild coronary artery calcifications . 3. Upper lobe small airway disease. 4. Cholelithiasis without CT findings of acute cholecystitis. 5. **An incidental finding of potential clinical significance has been found. 16 mm RIGHT thyroid nodule, recommend follow-up thyroid sonogram on nonemergent basis. This follows ACR consensus guidelines: Managing Incidental Thyroid Nodules Detected on Imaging: White Paper of the ACR Incidental Thyroid Findings Committee. J Am Coll Radiol 2015; 12:143-150. **  Myoview 05/11/16 No ischemia, EF 33, high risk  Echo 7/16 Diff HK worse in inf base, EF 40%, Gr 1 DD, mild AI, mild MR, mod LAE   LHC 11/14 LM:   minor irregularity. LAD:  diffuse irregularities but no significant stenoses throughout the course of the LAD LCx:   widely patent without obstructive disease. RCA:  widely patent.  Left ventriculography:   inferolateral wall is akinetic. The  anterolateral wall and apex appear hypokinetic. The basal inferior wall is hypokinetic. EF 35%.   Past Medical History:  Diagnosis Date  . Arthritis   . Chronic systolic CHF (congestive heart failure) (HCC)   . Diabetes mellitus ORAL MED  . ED (erectile  dysfunction)   . Glaucoma of right eye   . History of nuclear stress test    MV 1/18: EF 33, no ischemia; high risk  . History of prostate cancer   . Malfunction of artificial urethral sphincter (HCC)   . Malfunction of penile prosthesis (HCC)   . NICM (nonischemic cardiomyopathy) (HCC) 02/06/2015   LHC 11/14: EF 35, no significant CAD // Echo 7/16: Diff HK worse in inf base, EF 40%, Gr 1 DD, mild AI, mild MR, mod LAE // Echo 5/18: Moderate concentric LVH, EF 35-40, inferolateral and anterolateral AK, normal wall motion, moderate AI, moderate MR, moderate LAE, mild PI  . Nocturia   . SUI (stress urinary incontinence), male   . Thoracic aortic aneurysm without rupture (HCC) 02/06/2015   Chest CTA 1/18:  5.1 cm ascending aortic aneurysm, mild cardiomegaly, mild coronary artery calcification, upper lobe small airway disease, cholelithiasis, 16 mm right thyroid nodule    Past Surgical History:  Procedure Laterality Date  . I & D/ ORIF RIGHT INDEX FINGER  01-19-2005  . INSERTION INFLATABLE PENILE PROSTHESIS  06-22-1999   MENTOR ALPHA I  . IR GENERIC HISTORICAL  06/17/2016   IR RADIOLOGIST EVAL & MGMT 06/17/2016 Jolaine Click, MD [facility] INTERV RAD  . LEFT HEART CATHETERIZATION WITH CORONARY ANGIOGRAM N/A 03/15/2013   Procedure: LEFT HEART CATHETERIZATION WITH CORONARY ANGIOGRAM;  Surgeon: [name], MD;  Location: [facility] CATH LAB;  Service: Cardiovascular;  Laterality: N/A;  . PENILE PROSTHESIS IMPLANT  09/24/2011   Procedure: PENILE PROTHESIS INFLATABLE;  Surgeon: [name], MD;  Location: [facility];  Service: Urology;  Laterality: N/A;  REPLACEMENT OF INFLATABLE PENILE PROTHESIS (COLOPLAST)    . PLACEMENT ARTIFICIAL URINARY SPHINTER  01-20-2001   [id number]  . REMOVAL AND REPLACEMENT GU SPHINTER CUFF  05-15-2004   STRESS URINARY INCONTINENCE    Current Medications: Current Meds  Medication Sig  . aspirin 81 MG tablet Take 81 mg by mouth daily.  . Cholecalciferol  (VITAMIN D) 2000 UNITS CAPS Take 2,000 Units by mouth daily.   . dorzolamide-timolol (COSOPT) 22.3-6.8 MG/ML ophthalmic solution Place 1 drop into both eyes 2 (two) times daily.   . furosemide (LASIX) 40 MG tablet TAKE 1/2 TABLET BY MOUTH IN THE MORNING THEN 1/2 TABLET BY MOUTH AT 4 PM IN AFTERNOON  . glipiZIDE (GLUCOTROL) 10 MG tablet Take 10 mg by mouth 2 (two) times daily before a meal.  . latanoprost (XALATAN) 0.005 % ophthalmic solution Place 1 drop into both eyes at bedtime.   . meclizine (ANTIVERT) 25 MG tablet Take 25 mg by mouth 2 (two) times daily.  . metFORMIN (GLUCOPHAGE) 850 MG tablet Take 1,275 mg by mouth 2 (two) times daily with a meal.  . metoprolol succinate (TOPROL-XL) 50 MG 24 hr tablet Take 1 tablet (50 mg total) by mouth daily.  . simvastatin (ZOCOR) 10 MG tablet Take 1 tablet (10 mg total) by mouth daily at 6 PM.  . spironolactone (ALDACTONE) 25 MG tablet Take 1 tablet (25 mg total) by mouth daily.  . [DISCONTINUED] lisinopril (PRINIVIL,ZESTRIL) 10 MG tablet Take 1 tablet (10 mg total) by mouth daily.     Allergies:   Avandia [rosiglitazone] and Metformin and related  Social History   Social History  . Marital status: Divorced    Spouse name: N/A  . Number of children: N/A  . Years of education: N/A   Social History Main Topics  . Smoking status: Never Smoker  . Smokeless tobacco: Never Used  . Alcohol use No  . Drug use: No  . Sexual activity: Not Asked   Other Topics Concern  . None   Social History Narrative  . None     Family Hx: The patient's family history includes Healthy in his sister and sister; Heart Problems in his mother; Heart attack in his father.  ROS:   Please see the history of present illness.    Review of Systems  Cardiovascular: Positive for dyspnea on exertion.  Respiratory: Positive for shortness of breath.    All other systems reviewed and are negative.   EKGs/Labs/Other Test Reviewed:    EKG:  EKG is  ordered today.   The ekg ordered today demonstrates NSR, HR 61, PACs, first-degree AV block (PR 230 ms), RBBB, QTc 479 ms, no change from prior tracings  Recent Labs: 05/11/2016: TSH 0.570 09/08/2016: ALT 69; B Natriuretic Peptide 757.9; Hemoglobin 12.1; Platelets 172 09/11/2016: BUN 24; Creatinine, Ser 1.24; Potassium 4.0; Sodium 136   Recent Lipid Panel Lab Results  Component Value Date/Time   CHOL 166 09/09/2016 04:06 AM   TRIG 157 (H) 09/09/2016 04:06 AM   HDL 35 (L) 09/09/2016 04:06 AM   CHOLHDL 4.7 09/09/2016 04:06 AM   LDLCALC 100 (H) 09/09/2016 04:06 AM    Physical Exam:    VS:  BP (!) 138/52   Pulse 61   Ht 5\' 10"  (1.778 m)   Wt 235 lb 6.4 oz (106.8 kg)   SpO2 97%   BMI 33.78 kg/m     Wt Readings from Last 3 Encounters:  09/29/16 235 lb 6.4 oz (106.8 kg)  09/11/16 227 lb 4.8 oz (103.1 kg)  05/25/16 234 lb 8 oz (106.4 kg)     Physical Exam  Constitutional: He is oriented to person, place, and time. He appears well-developed and well-nourished. No distress.  HENT:  Head: Normocephalic and atraumatic.  Eyes: No scleral icterus.  Neck: Normal range of motion. No JVD present.  Cardiovascular: Normal rate, regular rhythm, S1 normal, S2 normal and normal heart sounds.   No murmur heard. Pulmonary/Chest: Effort normal and breath sounds normal. He has no wheezes. He has no rhonchi. He has no rales.  Abdominal: Soft. There is no tenderness.  Musculoskeletal: He exhibits no edema.  Neurological: He is alert and oriented to person, place, and time.  Skin: Skin is warm and dry.  Psychiatric: He has a normal mood and affect.    ASSESSMENT:    1. Chronic systolic CHF (congestive heart failure) (HCC)   2. NICM (nonischemic cardiomyopathy) (HCC)   3. Thoracic aortic aneurysm without rupture (HCC)   4. Aortic valve insufficiency, etiology of cardiac valve disease unspecified   5. Essential hypertension   6. Insomnia, unspecified type   7. Thyroid nodule    PLAN:    In order of  problems listed above:  1. Chronic systolic CHF (congestive heart failure) (HCC) -  He had a recent admission to the hospital with volume excess. EF was 35-40% which is consistent with his previous studies. Unfortunately, he went back to the emergency room in [place] with symptoms of dehydration. His potassium was elevated as well as his creatinine. His Lasix dose was reduced. Overall, his volume appears  to be stable. He is NYHA 2b. He is convinced that his medications are causing his insomnia. He has a prior history of anxiety. He was also in combat in [place]. He is a retired Hydrographic surveyor. He certainly has a lot of reasons to have PTSD.  We can try to change his ACE inhibitor and beta blocker to see if this helps. He does have a fairly nonproductive cough. I will start by changing his ACE inhibitor.  -  DC lisinopril  -  Start losartan 25 mg daily  -  Arrange follow-up BMET today; repeat 2 weeks  -  If potassium high on today's lab work, consider stopping spironolactone  -  If symptoms continue, consider changing metoprolol to carvedilol at next visit  2. NICM (nonischemic cardiomyopathy) (HCC) EF has remained 35-40%. As noted, his ACE inhibitor will be changed ARB. Consider changing beta blocker at follow-up. Continue aldosterone antagonist for now.  3. Thoracic aortic aneurysm without rupture (HCC) -  Ascending thoracic aortic aneurysm measurements have been stable. Referral to cardiothoracic surgery was discussed with him in the hospital. This is certainly reasonable and the patient is agreeable to meeting with a surgeon.   -  Plan: Ambulatory referral to Cardiothoracic Surgery  4. Aortic valve insufficiency, etiology of cardiac valve disease unspecified - Moderate aortic insufficiency on recent echo. This has overall been stable.   5. Essential hypertension - Blood pressure stable.   6. Insomnia, unspecified type - Etiology not entirely clear. I have asked him to  continue close follow-up with primary care. He may need evaluation for PTSD through psychiatry. I will try to adjust his CHF medications to see if this helps.   7. Thyroid nodule - Prior biopsy inconclusive. I have asked him to continue follow-up with primary care for further evaluation.  Total time spent with patient today 40 minutes. This includes reviewing records, evaluating the patient and coordinating care. Face-to-face time >50%.   Dispo:  Return in about 4 weeks (around 10/27/2016) for Close Follow Up, w/ [name], PA-C or Dr. [name].   Medication Adjustments/Labs and Tests Ordered: Current medicines are reviewed at length with the patient today.  Concerns regarding medicines are outlined above.  Orders/Tests:  Orders Placed This Encounter  Procedures  . Basic Metabolic Panel (BMET)  . Basic Metabolic Panel (BMET)  . Ambulatory referral to Cardiothoracic Surgery  . EKG 12-Lead   Medication changes: Meds ordered this encounter  Medications  . losartan (COZAAR) 25 MG tablet    Sig: Take 1 tablet (25 mg total) by mouth daily.    Dispense:  90 tablet    Refill:  3   Signed, [name], PA-C  09/29/2016 4:16 PM    [facility] [address] [place], [place], [place]  [address] Phone: [phone]; Fax: [phone]

## 2016-09-29 NOTE — Patient Instructions (Signed)
Medication Instructions:  1. STOP LISINOPRIL   2. START LOSARTAN 25 MG 1 TABLET DAILY; RX HAS BEEN SENT  Labwork: 1. BMET TODAY  2. BMET IN 2 WEEKS  Testing/Procedures: NONE ORDERED  Follow-Up: 1. YOU ARE BEING REFERRED TO [facility] FOR THORACIC ANEURYSM  2. SCOTT WEAVER, PAC 3-4 WEEKS SAME DAY DR. [name] IS IN THE OFFICE PER PA  Any Other Special Instructions Will Be Listed Below (If Applicable). MONITOR WEIGHT DAILY AND CALL IF WEIGHT IS UP BY 3 LB'S OR MORE IN 1 DAY OR 5 LB'S IN 1 WEEK [phone]    If you need a refill on your cardiac medications before your next appointment, please call your pharmacy.

## 2016-09-30 ENCOUNTER — Telehealth: Payer: Self-pay | Admitting: Physician Assistant

## 2016-09-30 NOTE — Telephone Encounter (Signed)
Follow Up:   Returning Carol's call from yesterday,concerning his lab results.

## 2016-09-30 NOTE — Telephone Encounter (Signed)
Informed pt of lab results. Pt verbalized understanding. 

## 2016-09-30 NOTE — Telephone Encounter (Signed)
Left message to call back  

## 2016-10-07 ENCOUNTER — Institutional Professional Consult (permissible substitution) (INDEPENDENT_AMBULATORY_CARE_PROVIDER_SITE_OTHER): Payer: Medicare Other | Admitting: Cardiothoracic Surgery

## 2016-10-07 ENCOUNTER — Encounter: Payer: Self-pay | Admitting: Cardiothoracic Surgery

## 2016-10-07 VITALS — BP 118/56 | HR 67 | Resp 16 | Ht 70.0 in | Wt 229.0 lb

## 2016-10-07 DIAGNOSIS — I5022 Chronic systolic (congestive) heart failure: Secondary | ICD-10-CM

## 2016-10-07 DIAGNOSIS — I200 Unstable angina: Secondary | ICD-10-CM | POA: Diagnosis not present

## 2016-10-07 DIAGNOSIS — I428 Other cardiomyopathies: Secondary | ICD-10-CM | POA: Diagnosis not present

## 2016-10-07 DIAGNOSIS — I351 Nonrheumatic aortic (valve) insufficiency: Secondary | ICD-10-CM | POA: Diagnosis not present

## 2016-10-07 DIAGNOSIS — I712 Thoracic aortic aneurysm, without rupture, unspecified: Secondary | ICD-10-CM

## 2016-10-07 NOTE — Progress Notes (Signed)
PCP is Briscoe, Sharrie Rothman, MD Referring Provider is Eddie North, MD  Chief Complaint  Patient presents with  . TAA    CTA CHEST 05/10/16, ECHO 09/09/16 (Dr. [name])  Patient examined, most recent echocardiogram and CT scans dating back  to 2014 personally reviewed and counseled with patient   HPI: 76 year old obese Caucasian male reformed smoker without history of hypertension or family history of aortic aneurysm disease presents for evaluation of a moderate fusiform ascending aortic aneurysm measuring at 5.0 cm. The patient has had a CT scan of the chest annually since 2014. The aortic measurement that time was 4.8 cm. The fusiform aneurysm is asymptomatic. The patient denies hypertension.  The patient was recently hospitalized for nonischemic cardiomyopathy and class III heart failure, responded to diuretics. His ejection fraction is 40%. He has mild-moderate aortic insufficiency. The patient had a cardiac catheterization by Dr. [name] approximately 18 months ago which apparently did not show significant CAD. The patient denies history of heavy alcohol intake. Patient currently is on losartan and a beta blocker and aspirin. He is unable to tolerate statin therapy.  Past Medical History:  Diagnosis Date  . Arthritis   . Chronic systolic CHF (congestive heart failure) (HCC)   . Diabetes mellitus ORAL MED  . ED (erectile dysfunction)   . Glaucoma of right eye   . History of nuclear stress test    MV 1/18: EF 33, no ischemia; high risk  . History of prostate cancer   . Malfunction of artificial urethral sphincter (HCC)   . Malfunction of penile prosthesis (HCC)   . NICM (nonischemic cardiomyopathy) (HCC) 02/06/2015   LHC 11/14: EF 35, no significant CAD // Echo 7/16: Diff HK worse in inf base, EF 40%, Gr 1 DD, mild AI, mild MR, mod LAE // Echo 5/18: Moderate concentric LVH, EF 35-40, inferolateral and anterolateral AK, normal wall motion, moderate AI, moderate MR, moderate LAE, mild PI  .  Nocturia   . SUI (stress urinary incontinence), male   . Thoracic aortic aneurysm without rupture (HCC) 02/06/2015   Chest CTA 1/18:  5.1 cm ascending aortic aneurysm, mild cardiomegaly, mild coronary artery calcification, upper lobe small airway disease, cholelithiasis, 16 mm right thyroid nodule    Past Surgical History:  Procedure Laterality Date  . I & D/ ORIF RIGHT INDEX FINGER  01-19-2005  . INSERTION INFLATABLE PENILE PROSTHESIS  06-22-1999   MENTOR ALPHA I  . IR GENERIC HISTORICAL  06/17/2016   IR RADIOLOGIST EVAL & MGMT 06/17/2016 Jolaine Click, MD [facility] INTERV RAD  . LEFT HEART CATHETERIZATION WITH CORONARY ANGIOGRAM N/A 03/15/2013   Procedure: LEFT HEART CATHETERIZATION WITH CORONARY ANGIOGRAM;  Surgeon: [name], MD;  Location[facility] CATH LAB;  Service: Cardiovascular;  Laterality: N/A;  . PENILE PROSTHESIS IMPLANT  09/24/2011   Procedure: PENILE PROTHESIS INFLATABLE;  Surgeon: [name], MD;  Location: [facility];  Service: Urology;  Laterality: N/A;  REPLACEMENT OF INFLATABLE PENILE PROTHESIS (COLOPLAST)    . PLACEMENT ARTIFICIAL URINARY SPHINTER  01-20-2001   [id number]  . REMOVAL AND REPLACEMENT GU SPHINTER CUFF  05-15-2004   STRESS URINARY INCONTINENCE    Family History  Problem Relation Age of Onset  . Heart Problems Mother   . Heart attack Father   . Healthy Sister        NO HEART ISSUES  . Healthy Sister        NO HEART ISSUES    Social History Social History  Substance Use Topics  .  Smoking status: Never Smoker  . Smokeless tobacco: Never Used  . Alcohol use No    Current Outpatient Prescriptions  Medication Sig Dispense Refill  . aspirin 81 MG tablet Take 81 mg by mouth daily.    . Cholecalciferol (VITAMIN D) 2000 UNITS CAPS Take 2,000 Units by mouth daily.     . dorzolamide-timolol (COSOPT) 22.3-6.8 MG/ML ophthalmic solution Place 1 drop into both eyes 2 (two) times daily.     . furosemide (LASIX) 40 MG tablet TAKE 1/2 TABLET  BY MOUTH IN THE MORNING THEN 1/2 TABLET BY MOUTH AT 4 PM IN AFTERNOON    . glipiZIDE (GLUCOTROL) 10 MG tablet Take 10 mg by mouth 2 (two) times daily before a meal.    . latanoprost (XALATAN) 0.005 % ophthalmic solution Place 1 drop into both eyes at bedtime.     Marland Kitchen losartan (COZAAR) 25 MG tablet Take 1 tablet (25 mg total) by mouth daily. 90 tablet 3  . meclizine (ANTIVERT) 25 MG tablet Take 25 mg by mouth 2 (two) times daily.    . metFORMIN (GLUCOPHAGE) 850 MG tablet Take 1,275 mg by mouth 2 (two) times daily with a meal.    . metoprolol succinate (TOPROL-XL) 50 MG 24 hr tablet Take 1 tablet (50 mg total) by mouth daily. (Patient taking differently: Take 50 mg by mouth every evening. ) 30 tablet 0  . spironolactone (ALDACTONE) 25 MG tablet Take 1 tablet (25 mg total) by mouth daily. 30 tablet 0   No current facility-administered medications for this visit.     Allergies  Allergen Reactions  . Statins Other (See Comments)    Myalgias.Marland KitchenMarland KitchenALL STATINS     Review of Systems      Patient complains of insomnia which has been severe for 6 months.      Patient denies any history of thoracic surgery or trauma.  Review of Systems :  [ y ] = yes, [  ] = no        General :  Weight gain [   ]    Weight loss  [   ]  Fatigue Cove.Etienne  ]  Fever [  ]  Chills  [  ]                                Weakness  [  ]           HEENT    Headache [  ]  Dizziness [  ]  Blurred vision [  ] Glaucoma  [  ]                          Nosebleeds [  ] Painful or loose teeth [  ]        Cardiac :  Chest pain/ pressure [  ]  Resting SOB [  ] exertional SOB [ y ]                        Orthopnea Cove.Etienne  ]  Pedal edema  [  ]  Palpitations [  ] Syncope/presyncope [ ]                         Paroxysmal nocturnal dyspnea [  ]         Pulmonary : cough Cove.Etienne  ]  wheezing [  ]  Hemoptysis [  ] Sputum [  ] Snoring [  ]                              Pneumothorax [  ]  Sleep apnea [  ]        GI : Vomiting [  ]  Dysphagia [  ]  Melena  [  ]   Abdominal pain [  ] BRBPR [  ]              Heart burn [  ]  Constipation [  ] Diarrhea  [  ] Colonoscopy [   ]        GU : Hematuria [  ]  Dysuria [  ]  Nocturia [ y ] UTI's [  ]        Vascular : Claudication [  ]  Rest pain [  ]  DVT [  ] Vein stripping [  ] leg ulcers [  ]                          TIA [  ] Stroke [  ]  Varicose veins [  ]        NEURO :  Headaches  [  ] Seizures [  ] Vision changes [  ] Paresthesias [  ]                                       Seizures [  ]        Musculoskeletal :  Arthritis [ y ] Gout  [  ]  Back pain [  ]  Joint pain [  ]        Skin :  Rash [  ]  Melanoma [  ] Sores [  ]        Heme : Bleeding problems [  ]Clotting Disorders [  ] Anemia [  ]Blood Transfusion [ ]         Endocrine : Diabetes [  ] Heat or Cold intolerance [  ] Polyuria [  ]excessive thirst [ ]         Psych : Depression [  ]  Anxiety [  ]  Psych hospitalizations [  ] Memory change [  ]                                               BP (!) 118/56 (BP Location: Right Arm, Patient Position: Sitting, Cuff Size: Large)   Pulse 67   Resp 16   Ht 5\' 10"  (1.778 m)   Wt 229 lb (103.9 kg)   SpO2 93% Comment: ON RA  BMI 32.86 kg/m  Physical Exam       Exam    General- alert and comfortable   Lungs- clear without rales, wheezes   Neck- no JVD, adenopathy   Cor- regular rate and rhythm, no murmur , gallop   Abdomen- soft, non-tender   Extremities - warm, non-tender, minimal edema   Neuro- oriented, appropriate, no focal weakness   Diagnostic Tests: Echo and CT scans personally reviewed  Impression: Nonischemic myocardiopathy ejection fraction 35-40 percent Mild-moderate aortic insufficiency Moderate  fusiform ascending aneurysm stable since 2014 without evidence of mural thickening or ulceration  Plan: Blood pressure control and serial scans are the patient's best therapy. Since his  aorta has been stable for several years we will have him return with a CTA in one year. He  knows importance of taking his blood pressure medications including beta blocker and losartan. He is given information about signs and symptoms of aortic dissection. Surgery would not be indicated until the diameter of the ascending aorta is > 5.5 cm. His surgery would be at increased risk because of his cardiomyopathy.  Mikey Bussing, MD [facility] [phone]

## 2016-10-18 ENCOUNTER — Other Ambulatory Visit: Payer: Medicare Other | Admitting: *Deleted

## 2016-10-18 ENCOUNTER — Telehealth: Payer: Self-pay | Admitting: *Deleted

## 2016-10-18 DIAGNOSIS — I5022 Chronic systolic (congestive) heart failure: Secondary | ICD-10-CM

## 2016-10-18 LAB — BASIC METABOLIC PANEL
BUN/Creatinine Ratio: 19 (ref 10–24)
BUN: 25 mg/dL (ref 8–27)
CALCIUM: 9.4 mg/dL (ref 8.6–10.2)
CHLORIDE: 100 mmol/L (ref 96–106)
CO2: 23 mmol/L (ref 20–29)
CREATININE: 1.31 mg/dL — AB (ref 0.76–1.27)
GFR calc Af Amer: 62 mL/min/{1.73_m2} (ref >59)
GFR calc non Af Amer: 53 mL/min/{1.73_m2} — ABNORMAL LOW (ref >59)
GLUCOSE: 233 mg/dL — AB (ref 65–99)
Potassium: 4.5 mmol/L (ref 3.5–5.2)
Sodium: 137 mmol/L (ref 134–144)

## 2016-10-18 NOTE — Telephone Encounter (Signed)
Pt has been notified of lab results by phone with verbal understanding. Pt thanked me for my call.  

## 2016-10-18 NOTE — Telephone Encounter (Signed)
-----   Message from Rosalio Macadamia, NP sent at 10/18/2016  4:14 PM EDT ----- Ok to report. Reviewed for [name], PA  BMET is stable - just slight rise in his kidney function - would continue with current plan of care and plan on rechecking labs at his return visit next week with Scott.

## 2016-10-25 ENCOUNTER — Ambulatory Visit: Payer: Medicare Other | Admitting: Physician Assistant

## 2016-11-03 ENCOUNTER — Other Ambulatory Visit (HOSPITAL_BASED_OUTPATIENT_CLINIC_OR_DEPARTMENT_OTHER): Payer: Self-pay

## 2016-11-03 DIAGNOSIS — R0683 Snoring: Secondary | ICD-10-CM

## 2016-11-23 ENCOUNTER — Ambulatory Visit (INDEPENDENT_AMBULATORY_CARE_PROVIDER_SITE_OTHER): Payer: Medicare Other | Admitting: Physician Assistant

## 2016-11-23 ENCOUNTER — Encounter: Payer: Self-pay | Admitting: Physician Assistant

## 2016-11-23 VITALS — BP 120/50 | HR 60 | Ht 70.0 in | Wt 232.2 lb

## 2016-11-23 DIAGNOSIS — I712 Thoracic aortic aneurysm, without rupture, unspecified: Secondary | ICD-10-CM

## 2016-11-23 DIAGNOSIS — I5022 Chronic systolic (congestive) heart failure: Secondary | ICD-10-CM

## 2016-11-23 DIAGNOSIS — I200 Unstable angina: Secondary | ICD-10-CM

## 2016-11-23 DIAGNOSIS — I10 Essential (primary) hypertension: Secondary | ICD-10-CM | POA: Insufficient documentation

## 2016-11-23 MED ORDER — FUROSEMIDE 40 MG PO TABS: 20.0000 mg | ORAL_TABLET | Freq: Every day | ORAL | 0 refills | Status: DC

## 2016-11-23 NOTE — Patient Instructions (Signed)
Medication Instructions:  Change Furosemide to 40 mg take 1/2 tablet daily.  Labwork: Today - BMET   Testing/Procedures: None   Follow-Up: Dr. [name] in 6 months.   Any Other Special Instructions Will Be Listed Below (If Applicable). Call me if your dizziness does not get better with taking Furosemide once a day every day.  If you need a refill on your cardiac medications before your next appointment, please call your pharmacy.

## 2016-11-23 NOTE — Progress Notes (Signed)
Cardiology Office Note:    Date:  11/23/2016   ID:  Alan [name], DOB Sep 09, 1940, MRN [id number]  PCP:  Macy Mis, MD  Cardiologist:  Dr. [name]    Referring MD: Macy Mis, MD   Chief Complaint  Patient presents with  . Congestive Heart Failure    follow up    History of Present Illness:    Alan [name] is a 76 y.o. male with a hx of NICM, ascending thoracic aortic aneurysm, mod AI, diabetes, HTN, prostate CA, thyroid nodule.  EF has improved in the past.  Echo in 2016 demonstrated EF 40%. Cardiac catheterization in 2014 demonstrated no significant CAD. CT angiogram in 04/2016 demonstrated stable ascending aortic aneurysm 5.1 cm.  He was admitted in 1/18 with chest pain. Myoview demonstrated no ischemia. EF was 45-50% by echocardiogram. CHF medications were adjusted. He was admitted again in 5/18 with a/c systolic HF. Echo demonstrated EF 35-40%.  He then went to the ED at [facility] in [place] 09/17/16 with shortness of breath and labs indicated that he was dehydrated (K 5.6, creatinine 1.65, BUN 60). His d-dimer was elevated and a Chest CT was negative for pulmonary embolism.  He was given IVFs ad his potassium dose was reduced.   I saw him in follow up 09/29/16.  He complained of insomnia that he attributed to his medications.  We agreed to change his ACE inhibitor to an angiotensin receptor blocker.  If he continues to have symptoms, I will change his beta-blocker to Carvedilol.    He was seen by Dr. Donata Clay in 6/18 for evaluation of his thoracic aortic aneurysm.  He will be seen again in 1 year for a repeat CT.  He will not need surgery until his aneurysm is > 5.5 cm.    Alan [name] returns for follow up.  He is doing well.  His psychiatrist started him on Escitalopram.  He is sleeping much better now.  He denies any chest pain, shortness of breath, syncope, paroxysmal nocturnal dyspnea or edema.  He does get lightheaded if he stands quickly sometimes.    Prior CV studies:   The following studies were reviewed today:  Echo 09/09/16 Moderate concentric LVH, EF 35-40, inferolateral and anterolateral AK, normal wall motion, moderate AI, moderate MR, moderate LAE, mild PI   Chest CTA 05/10/16 IMPRESSION: 1. 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 Ascending thoracic aortic aneurysm. Recommend continued semi-annual imaging followup by CTA or MRA and referral to cardiothoracic surgery if not already obtained. This recommendation follows 2010 ACCF/AHA/AATS/ACR/ASA/SCA/SCAI/SIR/STS/SVM Guidelines for the Diagnosis and Management of Patients With Thoracic Aortic Disease. Circulation. 2010; 121: X324-M010. 2. Mild cardiomegaly and mild coronary artery calcifications . 3. Upper lobe small airway disease. 4. Cholelithiasis without CT findings of acute cholecystitis. 5. **An incidental finding of potential clinical significance has been found. 16 mm RIGHT thyroid nodule, recommend follow-up thyroid sonogram on nonemergent basis. This follows ACR consensus guidelines: Managing Incidental Thyroid Nodules Detected on Imaging: White Paper of the ACR Incidental Thyroid Findings Committee. J Am Coll Radiol 2015; 12:143-150. **   Myoview 05/11/16 No ischemia, EF 33, high risk   Echo 7/16 Diff HK worse in inf base, EF 40%, Gr 1 DD, mild AI, mild MR, mod LAE   LHC 11/14 LM:   minor irregularity. LAD:  diffuse irregularities but no significant stenoses throughout the course of the LAD LCx:   widely patent without obstructive disease. RCA:  widely patent.  Left ventriculography:   inferolateral wall is akinetic. The anterolateral wall and apex appear hypokinetic. The basal inferior wall is hypokinetic. EF 35%.  Past Medical History:  Diagnosis Date  . Arthritis   . Chronic systolic CHF (congestive heart failure) (HCC)   . Diabetes mellitus ORAL MED  . ED (erectile dysfunction)   . Glaucoma of right eye   . History  of nuclear stress test    MV 1/18: EF 33, no ischemia; high risk  . History of prostate cancer   . Malfunction of artificial urethral sphincter (HCC)   . Malfunction of penile prosthesis (HCC)   . NICM (nonischemic cardiomyopathy) (HCC) 02/06/2015   LHC 11/14: EF 35, no significant CAD // Echo 7/16: Diff HK worse in inf base, EF 40%, Gr 1 DD, mild AI, mild MR, mod LAE // Echo 5/18: Moderate concentric LVH, EF 35-40, inferolateral and anterolateral AK, normal wall motion, moderate AI, moderate MR, moderate LAE, mild PI  . Nocturia   . SUI (stress urinary incontinence), male   . Thoracic aortic aneurysm without rupture (HCC) 02/06/2015   Chest CTA 1/18:  5.1 cm ascending aortic aneurysm, mild cardiomegaly, mild coronary artery calcification, upper lobe small airway disease, cholelithiasis, 16 mm right thyroid nodule    Past Surgical History:  Procedure Laterality Date  . I & D/ ORIF RIGHT INDEX FINGER  01-19-2005  . INSERTION INFLATABLE PENILE PROSTHESIS  06-22-1999   MENTOR ALPHA I  . IR GENERIC HISTORICAL  06/17/2016   IR RADIOLOGIST EVAL & MGMT 06/17/2016 Jolaine Click, MD [facility] INTERV RAD  . LEFT HEART CATHETERIZATION WITH CORONARY ANGIOGRAM N/A 03/15/2013   Procedure: LEFT HEART CATHETERIZATION WITH CORONARY ANGIOGRAM;  Surgeon: [name], MD;  Location: [facility] CATH LAB;  Service: Cardiovascular;  Laterality: N/A;  . PENILE PROSTHESIS IMPLANT  09/24/2011   Procedure: PENILE PROTHESIS INFLATABLE;  Surgeon: [name], MD;  Location[facility];  Service: Urology;  Laterality: N/A;  REPLACEMENT OF INFLATABLE PENILE PROTHESIS (COLOPLAST)    . PLACEMENT ARTIFICIAL URINARY SPHINTER  01-20-2001   [id number]  . REMOVAL AND REPLACEMENT GU SPHINTER CUFF  05-15-2004   STRESS URINARY INCONTINENCE    Current Medications: Current Meds  Medication Sig  . aspirin 81 MG tablet Take 81 mg by mouth daily.  . Cholecalciferol (VITAMIN D) 2000 UNITS CAPS Take 2,000 Units by mouth  daily.   . dorzolamide-timolol (COSOPT) 22.3-6.8 MG/ML ophthalmic solution Place 1 drop into both eyes 2 (two) times daily.   Marland Kitchen escitalopram (LEXAPRO) 10 MG tablet Take 10 mg by mouth daily.  Marland Kitchen glipiZIDE (GLUCOTROL) 10 MG tablet Take 10 mg by mouth 2 (two) times daily before a meal.  . latanoprost (XALATAN) 0.005 % ophthalmic solution Place 1 drop into both eyes at bedtime.   Marland Kitchen losartan (COZAAR) 25 MG tablet Take 1 tablet (25 mg total) by mouth daily.  . meclizine (ANTIVERT) 25 MG tablet Take 25 mg by mouth 2 (two) times daily.  . metFORMIN (GLUCOPHAGE) 500 MG tablet TAKE 1 1/2 TABLET (750 MG TOTAL) BY MOUTH 2 (TWO ) TIMES DAILY  . metoprolol succinate (TOPROL-XL) 50 MG 24 hr tablet Take 1 tablet (50 mg total) by mouth daily.  Marland Kitchen spironolactone (ALDACTONE) 25 MG tablet Take 1 tablet (25 mg total) by mouth daily.  . [DISCONTINUED] furosemide (LASIX) 40 MG tablet TAKE 1/2 TABLET BY MOUTH IN THE MORNING THEN 1/2 TABLET BY MOUTH AT 4 PM IN AFTERNOON     Allergies:   Statins  Social History   Social History  . Marital status: Divorced    Spouse name: N/A  . Number of children: N/A  . Years of education: N/A   Social History Main Topics  . Smoking status: Never Smoker  . Smokeless tobacco: Never Used  . Alcohol use No  . Drug use: No  . Sexual activity: Not Asked   Other Topics Concern  . None   Social History Narrative  . None     Family Hx: The patient's family history includes Healthy in his sister and sister; Heart Problems in his mother; Heart attack in his father.  ROS:   Please see the history of present illness.    ROS All other systems reviewed and are negative.   EKGs/Labs/Other Test Reviewed:    EKG:  EKG is  ordered today.  The ekg ordered today demonstrates NSR, HR 60, RBBB, QTc 474, no change when compared to prior tracing.   Recent Labs: 05/11/2016: TSH 0.570 09/08/2016: ALT 69; B Natriuretic Peptide 757.9; Hemoglobin 12.1; Platelets 172 10/18/2016: BUN 25;  Creatinine, Ser 1.31; Potassium 4.5; Sodium 137   Recent Lipid Panel Lab Results  Component Value Date/Time   CHOL 166 09/09/2016 04:06 AM   TRIG 157 (H) 09/09/2016 04:06 AM   HDL 35 (L) 09/09/2016 04:06 AM   CHOLHDL 4.7 09/09/2016 04:06 AM   LDLCALC 100 (H) 09/09/2016 04:06 AM    Physical Exam:    VS:  BP (!) 120/50   Pulse 60   Ht 5\' 10"  (1.778 m)   Wt 232 lb 3.2 oz (105.3 kg)   BMI 33.32 kg/m     Wt Readings from Last 3 Encounters:  11/23/16 232 lb 3.2 oz (105.3 kg)  10/07/16 229 lb (103.9 kg)  09/29/16 235 lb 6.4 oz (106.8 kg)     Physical Exam  Constitutional: He is oriented to person, place, and time. He appears well-developed and well-nourished. No distress.  HENT:  Head: Normocephalic and atraumatic.  Eyes: No scleral icterus.  Cardiovascular: Normal rate and regular rhythm.   No murmur heard. Pulmonary/Chest: Effort normal. He has no wheezes. He has no rales.  Abdominal: There is no tenderness.  Musculoskeletal: He exhibits no edema.  Neurological: He is alert and oriented to person, place, and time.  Skin: Skin is warm and dry.  Psychiatric: He has a normal mood and affect.    ASSESSMENT:    1. Chronic systolic CHF (congestive heart failure) (HCC)   2. Thoracic aortic aneurysm without rupture (HCC)    PLAN:    In order of problems listed above:  1. Chronic systolic CHF (congestive heart failure) (HCC) -  EF 35-40.  He is doing very well on current therapy.  He is NYHA 2-2b.  His volume is stable.  He is now feeling much better on Escitalopram which is helping him sleep.  Therefore, I will not change any more of his HF medications.  Last BMET did demonstrate a slightly elevated Creatinine (after starting Losartan).  He has occasional dizziness with standing quickly.  -  Continue Losartan, Metoprolol, Spironolactone  -  BMET today  -  Reduce Lasix to 20 mg QD  -  If dizziness continues, consider changing Spironolactone to 12.5 mg QD  2. Thoracic  aortic aneurysm without rupture (HCC) 5.1 cm.  He is now followed by Dr. Donata Clay.  He will have another CT in 1 year for surveillance.   Dispo:  Return in about 6 months (around 05/26/2017) for  Routine Follow Up, w/ Dr. [name].   Medication Adjustments/Labs and Tests Ordered: Current medicines are reviewed at length with the patient today.  Concerns regarding medicines are outlined above.  Tests Ordered: Orders Placed This Encounter  Procedures  . Basic metabolic panel  . EKG 12-Lead   Medication Changes: Meds ordered this encounter  Medications  . furosemide (LASIX) 40 MG tablet    Sig: Take 0.5 tablets (20 mg total) by mouth daily.    Dispense:  90 tablet    Refill:  0    Order Specific Question:   Supervising Provider    Answer:   Regan Lemming [[id number]]    Signed, [name], PA-C  11/23/2016 3:23 PM    [facility] [address] [place], [place], [place]  [address] Phone: [phone]; Fax: [phone]

## 2016-11-24 LAB — BASIC METABOLIC PANEL
BUN / CREAT RATIO: 21 (ref 10–24)
BUN: 21 mg/dL (ref 8–27)
CHLORIDE: 100 mmol/L (ref 96–106)
CO2: 26 mmol/L (ref 20–29)
Calcium: 9.6 mg/dL (ref 8.6–10.2)
Creatinine, Ser: 1 mg/dL (ref 0.76–1.27)
GFR calc non Af Amer: 74 mL/min/{1.73_m2} (ref >59)
GFR, EST AFRICAN AMERICAN: 85 mL/min/{1.73_m2} (ref >59)
GLUCOSE: 186 mg/dL — AB (ref 65–99)
Potassium: 4.9 mmol/L (ref 3.5–5.2)
SODIUM: 140 mmol/L (ref 134–144)

## 2017-07-01 IMAGING — US US THYROID BIOPSY
1 series · 13 of 13 positions shown · non-contrast
Comparison: Ultrasound done 06/04/2016

MEDICATIONS:
1% Lidocaine = 5 mL

COMPLICATIONS:
None immediate.

ADDENDUM:
The patient returned to the [REDACTED]
approximately 3 hours following the uneventful ultrasound-guided
right-sided thyroid nodule biopsy complaining of right-sided neck
pain, swelling and difficulty swallowing.

By the time the patient returned to the IR clinic, his symptoms had
improved.
Ultrasound scanning demonstrated development of a small hematoma
cranial to the biopsy site. The adjacent vasculature appear normal.
No definitive color flow was seen within the hematoma.
The patient was instructed to try to remain seated upright at least
45 degrees for the next 2-3 hours. He was also encouraged to
maintain diligence with applying an ice pack.
No additional intervention is warranted at this time.
The patient was encouraged to call the [REDACTED] with any subsequent concerns.
INDICATION: Indeterminate right thyroid nodule
EXAM:
ULTRASOUND GUIDED FINE NEEDLE ASPIRATION OF INDETERMINATE RIGHT
THYROID NODULE
TECHNIQUE: Informed written consent was obtained from the patient after a
discussion of the risks, benefits and alternatives to treatment.
Questions regarding the procedure were encouraged and answered. A
timeout was performed prior to the initiation of the procedure.

[Series 1: us thyroid biopsy · 0.07mm/px · 13 acquisitions, 13 frames shown]
[im 1/13]
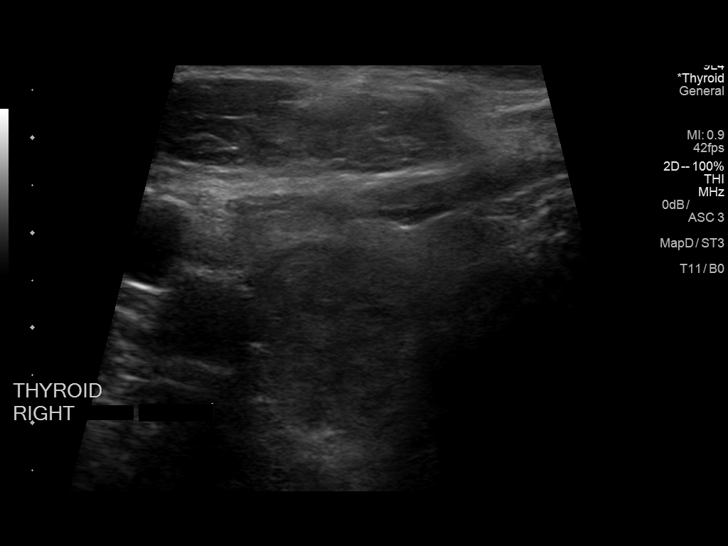
[im 2/13]
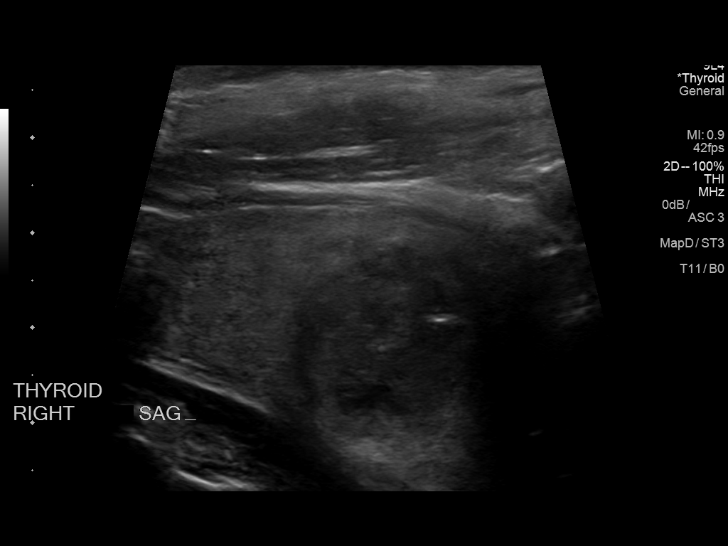
[im 3/13]
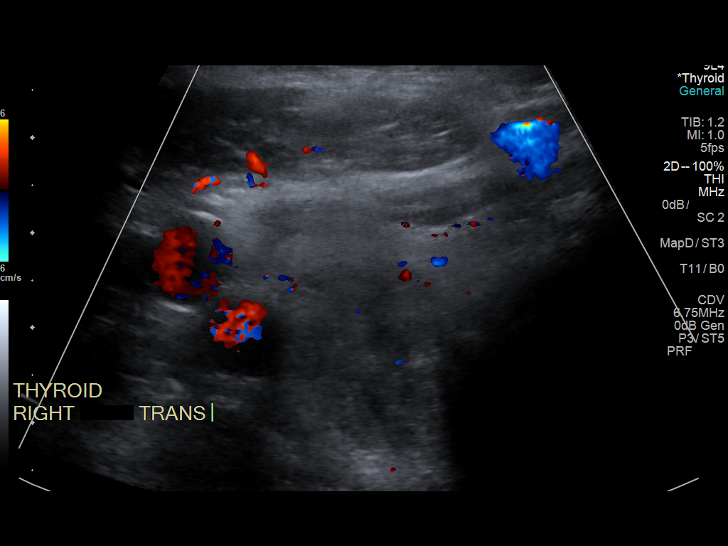
[im 4/13]
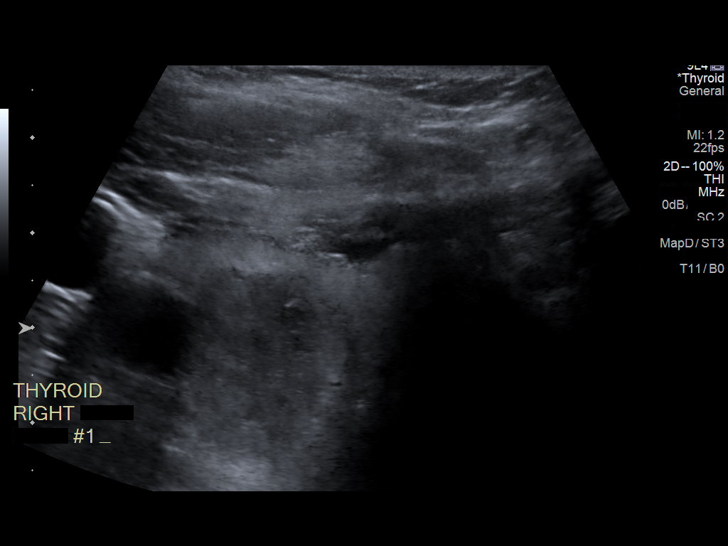
[im 5/13]
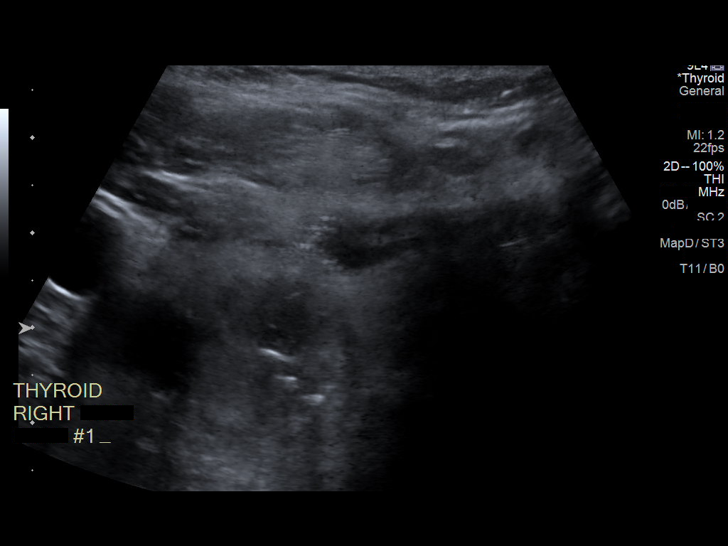
[im 6/13]
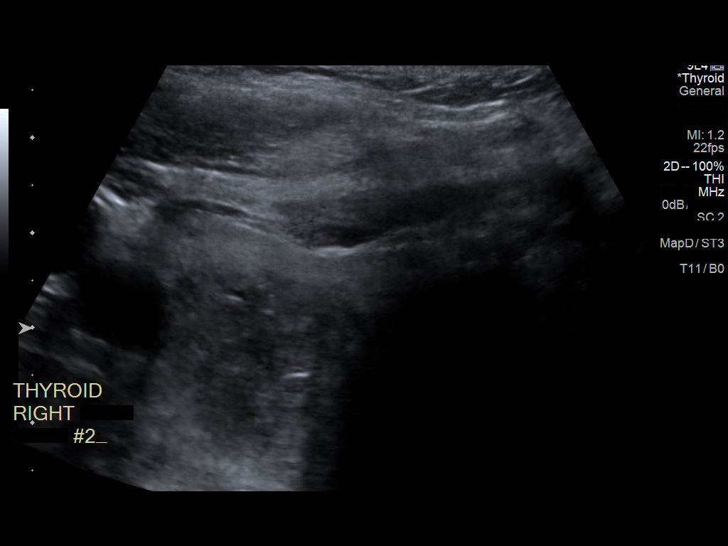
[im 7/13]
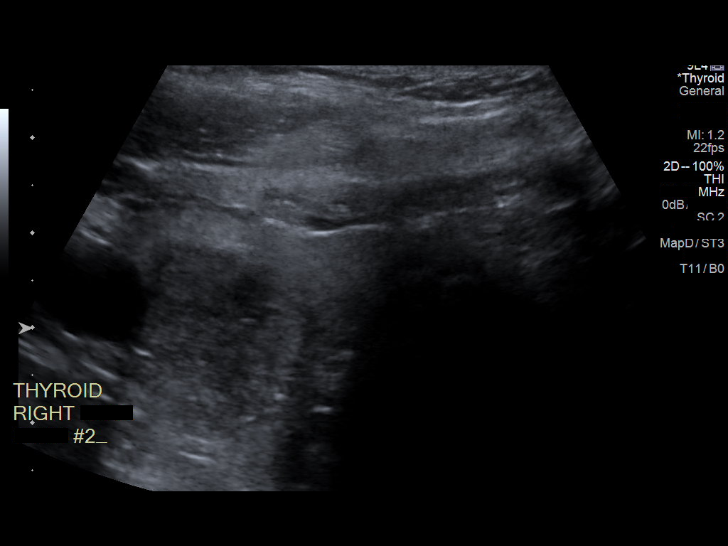
[im 8/13]
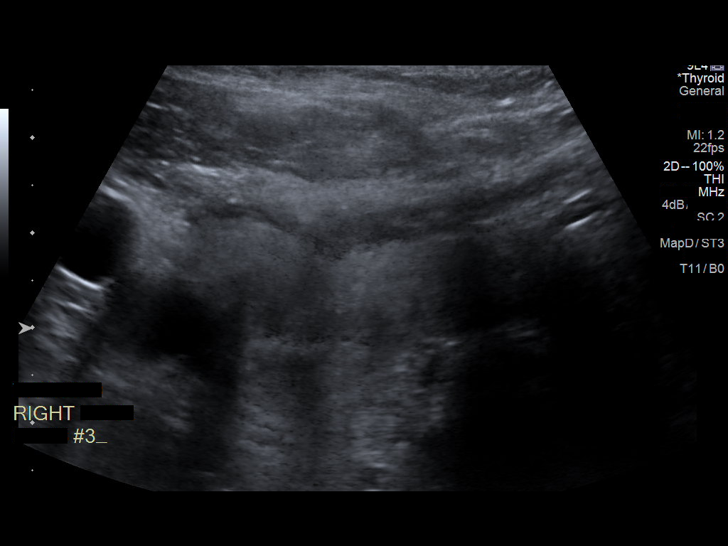
[im 9/13]
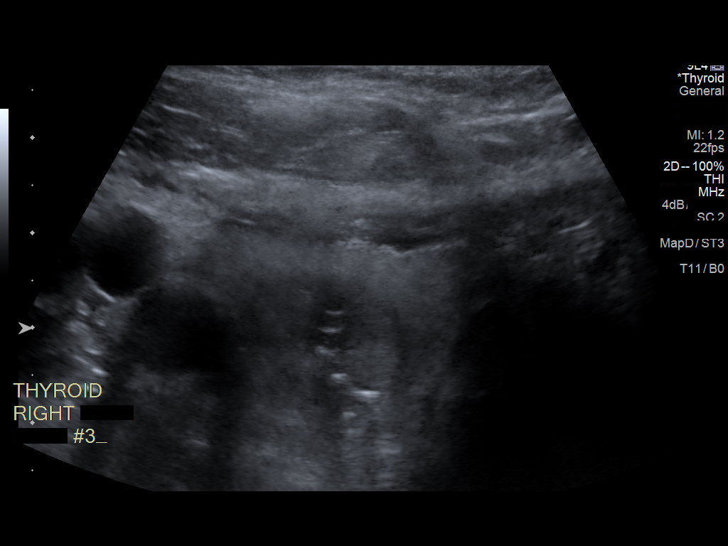
[im 10/13]
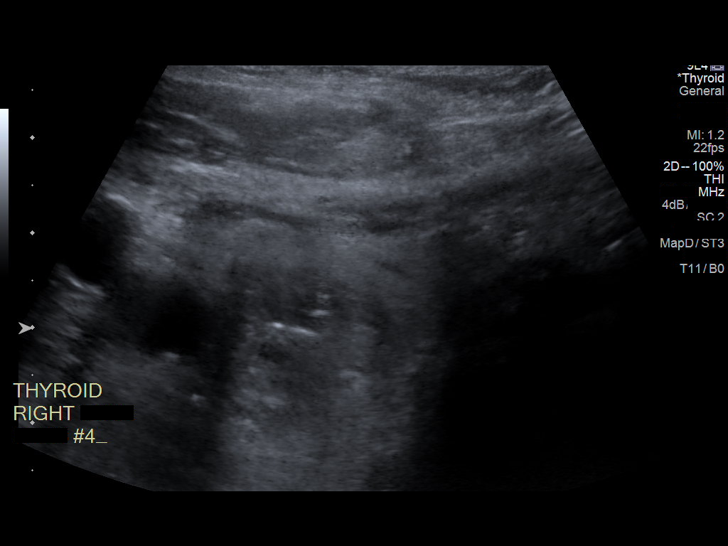
[im 11/13]
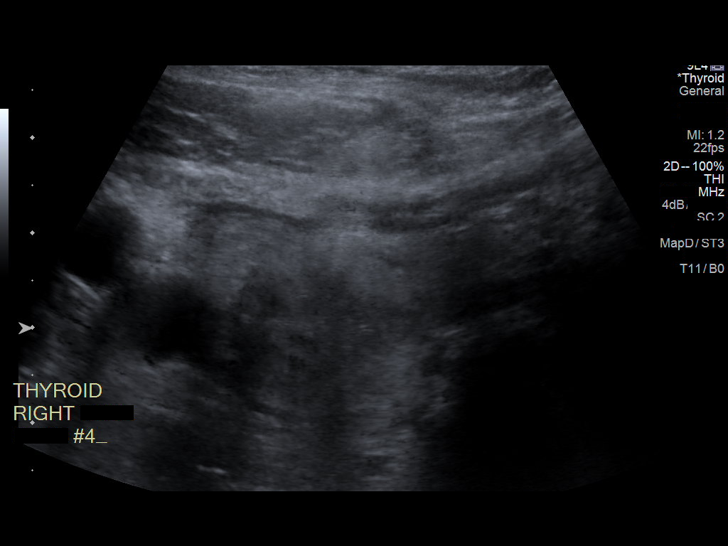
[im 12/13]
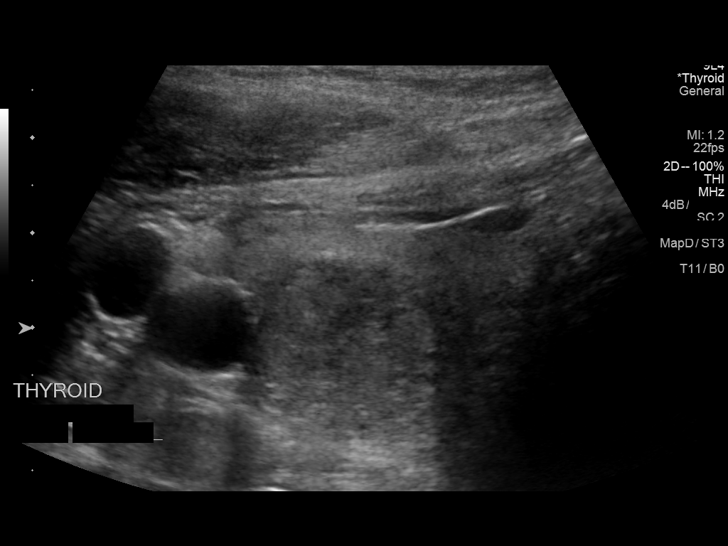
[im 13/13]
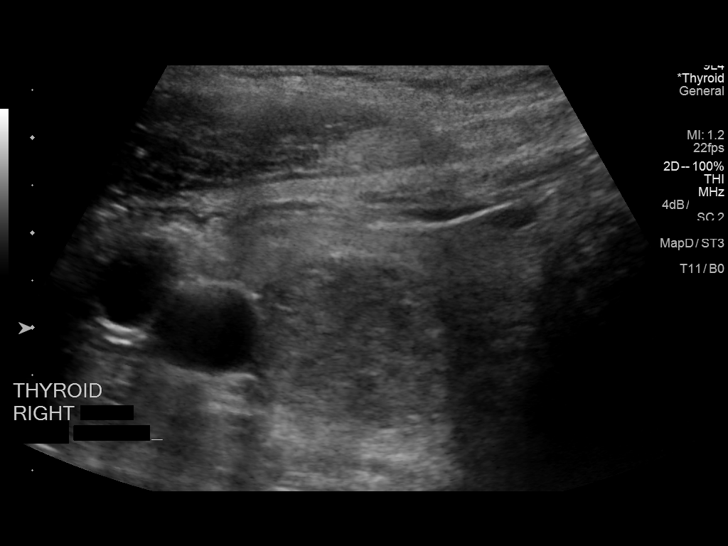

[13 of 13 positions shown; findings below may reference images not displayed]

Pre-procedural ultrasound scanning demonstrated unchanged size and
appearance of the indeterminate nodule within the right inferior
lobe of the thyroid.

The procedure was planned. The neck was prepped in the usual sterile
fashion, and a sterile drape was applied covering the operative
field. A timeout was performed prior to the initiation of the
procedure. Local anesthesia was provided with 1% lidocaine.

Under direct ultrasound guidance, 4 FNA biopsies were performed of
the right thyroid nodule with a 25 gauge needle. Multiple ultrasound
images were saved for procedural documentation purposes. The samples
were prepared and submitted to pathology.

Limited post procedural scanning was negative for hematoma or
additional complication. Dressings were placed. The patient
tolerated the above procedures procedure well without immediate
postprocedural complication.
FINDINGS: FINDINGS
Nodule reference number based on prior diagnostic ultrasound: 2

Maximum size:  2.5 cm

Location: Right; Inferior

ACR TI-RADS risk category: TR4 (4-6 points)

Reason for biopsy: meets ACR TI-RADS criteria

Ultrasound imaging confirms appropriate placement of the needles
within the thyroid nodule.
IMPRESSION: Technically successful ultrasound guided fine needle aspiration of
the right inferior thyroid nodule.

## 2017-08-02 ENCOUNTER — Telehealth: Payer: Self-pay | Admitting: Physician Assistant

## 2017-08-02 NOTE — Telephone Encounter (Signed)
  Patient called because he is having problems with shortness of breath.  He is short of breath whenever he does anything.  This includes going to the mailbox, or walking through the house.  He has a sit down and rest.  He has to sleep sitting up.  This has been going on for a while, but it has gotten worse.  He thinks he is having panic attacks, but I explained that I feel this is related to shortness of breath.  I explained that I feel he needs to be evaluated, and requested he go to the emergency room.  He was initially reluctant to go and stated he would see how he felt in the next few minutes.  However, he seemed to change his mind and I again encouraged him to go to the emergency room.  He has not been seen since July 2018, I will route this to the schedulers so they can make him an appointment with Dr. [name] or someone on his team and call him.  Theodore Demark, PA-C 08/02/2017 7:14 PM Beeper [phone]

## 2017-08-03 ENCOUNTER — Telehealth: Payer: Self-pay

## 2017-08-03 NOTE — Telephone Encounter (Signed)
Patient reports several concerns.  He states he has not slept in 6 months. He "stays up all night until the sun comes up." He states he feels like he is in a constant haze and "feels drunk," but he doesn't drink alcohol.  About 4-5 months ago, he started having panic attacks at night. This is accompanied by "really bad shortness of breath."  He reports the panic attacks are becoming progressively worse and in the last week they have been occurring during the day and are getting more serious.  He called and spoke with Theodore Demark last night and was instructed to go to the Emergency Room for evaluation, but he did not go. He states "the emergency room is a waste of time." He now reports he does not typically experience SOB normally, just during these episodes. Encouraged patient to contact PCP regarding panic attacks, but he states "the [place] won't do anything."  Informed him the [place] would be called to discuss his care and schedule appointment.  He also reports dry mouth. He states he cannot go 5 minutes without having to drink any water and his mouth is so dry he can't talk. He states he monitors his feet daily looking for swelling and he has none. He is convinced his medications are "wrong." He thinks he is taking too much Lasix and wants to stop taking it. Encouraged him not to stop medication, but to take a 1/2 dose daily instead until he can be seen.  Scheduled him for appointment Monday, 4/15 for evaluation.  [facility] The available nurse said they could not accommodate an earlier appointment than already scheduled in May, but he could contact Mental Health for aid. He states she will call him later today to assist him with this.  Called the patient back and informed him the [place] will call him later today to discuss Mental Health. Confirmed OV with Cardiology next Monday for evaluation. He understands to seek emergency medical attention if SOB worsens in the  meantime.

## 2017-08-03 NOTE — Telephone Encounter (Signed)
Thanks - agree with plan as outlined. 

## 2017-08-08 ENCOUNTER — Ambulatory Visit (INDEPENDENT_AMBULATORY_CARE_PROVIDER_SITE_OTHER): Payer: Medicare Other | Admitting: Physician Assistant

## 2017-08-08 ENCOUNTER — Encounter: Payer: Self-pay | Admitting: Physician Assistant

## 2017-08-08 VITALS — BP 140/60 | HR 66 | Ht 70.0 in | Wt 239.0 lb

## 2017-08-08 DIAGNOSIS — I712 Thoracic aortic aneurysm, without rupture, unspecified: Secondary | ICD-10-CM

## 2017-08-08 DIAGNOSIS — I5022 Chronic systolic (congestive) heart failure: Secondary | ICD-10-CM

## 2017-08-08 DIAGNOSIS — I10 Essential (primary) hypertension: Secondary | ICD-10-CM | POA: Diagnosis not present

## 2017-08-08 DIAGNOSIS — R0602 Shortness of breath: Secondary | ICD-10-CM | POA: Diagnosis not present

## 2017-08-08 MED ORDER — FUROSEMIDE 20 MG PO TABS: 10.0000 mg | ORAL_TABLET | Freq: Every day | ORAL | Status: DC

## 2017-08-08 NOTE — Patient Instructions (Signed)
Medication Instructions:  1. CONTINUE TO ON YOUR CURRENT DOSE OF LASIX; MED LIST HAS BEEN CHANGED TO SHOW CORRECT DOSE   Labwork: NONE ORDERED TODAY  Testing/Procedures: .Your physician has requested that you have an echocardiogram. Echocardiography is a painless test that uses sound waves to create images of your heart. It provides your doctor with information about the size and shape of your heart and how well your heart's chambers and valves are working. This procedure takes approximately one hour. There are no restrictions for this procedure.    Follow-Up: Your physician wants you to follow-up in: 6 MONTHS WITH DR. Theodoro Parma will receive a reminder letter in the mail two months in advance. If you don't receive a letter, please call our office to schedule the follow-up appointment.   Any Other Special Instructions Will Be Listed Below (If Applicable).     If you need a refill on your cardiac medications before your next appointment, please call your pharmacy.

## 2017-08-08 NOTE — Progress Notes (Signed)
Cardiology Office Note:    Date:  08/08/2017   ID:  Alan [name], DOB [birth year], MRN [id number]  PCP:  Macy Mis, MD  Cardiologist:  [name], MD   Referring MD: Macy Mis, MD   Chief Complaint  Patient presents with  . Shortness of Breath    History of Present Illness:    Alan [name] is a 77 y.o. male with NICM, ascending thoracic aortic aneurysm, mod AI, diabetes,HTN,prostate CA, thyroid nodule. EF has improved in the past. Echo in 2016 demonstrated EF 40%. Cardiac catheterization in 2014 demonstrated no significant CAD. CT angiogram in 04/2016 demonstrated stable ascending aortic aneurysm 5.1 cm.  He was admitted in 1/18 with chest pain. Myoview demonstrated no ischemia. EF was 45-50% by echocardiogram. CHF medications were adjusted. He was admitted again in 5/18 with a/c systolic HF. Echo demonstrated EF 35-40%.    He has been evaluated by Dr. Maren Beach for his thoracic aortic aneurysm in June 2018.  He was last seen November 23, 2016.  He recently called in with complaints of shortness of breath.  Alan [name] returns for evaluation of shortness of breath.  He is here alone.  He notes waking up short of breath for the last several weeks.  He has to go outside to catch his breath.  He feels panicked.  He went to the ED at [facility] last month.  His Tn, BNP, Chest CT were all negative.  He was given an anxiolytic with improved symptoms.  He did reduce his Lasix to 10 mg QD and feels much better.  He notes dyspnea on exertion with mod to extreme activities.  He denies chest pain, edema, syncope.  He denies any bleeding issues.   Prior CV studies:   The following studies were reviewed today:  Echo 09/09/16 Moderate concentric LVH, EF 35-40,inferolateral and anterolateral AK, normal wall motion, moderate AI, moderate MR, moderate LAE, mild PI  Chest CTA 05/10/16 IMPRESSION: 1. 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 Ascending  thoracic aortic aneurysm. Recommend continued semi-annual imaging followup by CTA or MRA and referral to cardiothoracic surgery if not already obtained. This recommendation follows 2010 ACCF/AHA/AATS/ACR/ASA/SCA/SCAI/SIR/STS/SVM Guidelines for the Diagnosis and Management of Patients With Thoracic Aortic Disease. Circulation. 2010; 121: A213-Y865. 2. Mild cardiomegaly and mild coronary artery calcifications . 3. Upper lobe small airway disease. 4. Cholelithiasis without CT findings of acute cholecystitis. 5. **An incidental finding of potential clinical significance has been found. 16 mm RIGHT thyroid nodule, recommend follow-up thyroid sonogram on nonemergent basis. This follows ACR consensus guidelines: Managing Incidental Thyroid Nodules Detected on Imaging: White Paper of the ACR Incidental Thyroid Findings Committee. J Am Coll Radiol 2015; 12:143-150. **  Myoview 05/11/16 No ischemia, EF 33, high risk  Echo 7/16 Diff HK worse in inf base, EF 40%, Gr 1 DD, mild AI, mild MR, mod LAE  LHC 11/14 LM: minor irregularity. LAD: diffuse irregularities but no significant stenoses throughout the course of the LAD LCx: widely patent without obstructive disease. RCA: widely patent.  Left ventriculography: inferolateral wall is akinetic. The anterolateral wall and apex appear hypokinetic. The basal inferior wall is hypokinetic. EF 35%.  Past Medical History:  Diagnosis Date  . Arthritis   . Chronic systolic CHF (congestive heart failure) (HCC)   . Diabetes mellitus ORAL MED  . ED (erectile dysfunction)   . Glaucoma of right eye   . History of nuclear stress test    MV 1/18: EF 33, no ischemia;  high risk  . History of prostate cancer   . Malfunction of artificial urethral sphincter (HCC)   . Malfunction of penile prosthesis (HCC)   . NICM (nonischemic cardiomyopathy) (HCC) 02/06/2015   LHC 11/14: EF 35, no significant CAD // Echo 7/16: Diff HK worse in inf base, EF 40%, Gr 1  DD, mild AI, mild MR, mod LAE // Echo 5/18: Moderate concentric LVH, EF 35-40, inferolateral and anterolateral AK, normal wall motion, moderate AI, moderate MR, moderate LAE, mild PI  . Nocturia   . SUI (stress urinary incontinence), male   . Thoracic aortic aneurysm without rupture (HCC) 02/06/2015   Chest CTA 1/18:  5.1 cm ascending aortic aneurysm, mild cardiomegaly, mild coronary artery calcification, upper lobe small airway disease, cholelithiasis, 16 mm right thyroid nodule   Surgical Hx: The patient  has a past surgical history that includes I & D/ ORIF RIGHT INDEX FINGER (01-19-2005); REMOVAL AND REPLACEMENT GU SPHINTER CUFF (05-15-2004); PLACEMENT ARTIFICIAL URINARY SPHINTER (01-20-2001); INSERTION INFLATABLE PENILE PROSTHESIS (06-22-1999); Penile prosthesis implant (09/24/2011); left heart catheterization with coronary angiogram (N/A, 03/15/2013); and ir generic historical (06/17/2016).   Current Medications: Current Meds  Medication Sig  . aspirin 81 MG tablet Take 81 mg by mouth daily.  . Cholecalciferol (VITAMIN D) 2000 UNITS CAPS Take 2,000 Units by mouth daily.   . dorzolamide-timolol (COSOPT) 22.3-6.8 MG/ML ophthalmic solution Place 1 drop into both eyes 2 (two) times daily.   Marland Kitchen glipiZIDE (GLUCOTROL) 10 MG tablet Take 10 mg by mouth 2 (two) times daily before a meal.  . latanoprost (XALATAN) 0.005 % ophthalmic solution Place 1 drop into both eyes at bedtime.   Marland Kitchen losartan (COZAAR) 25 MG tablet Take 25 mg by mouth daily.  . meclizine (ANTIVERT) 25 MG tablet Take 25 mg by mouth 2 (two) times daily.  . metFORMIN (GLUCOPHAGE) 500 MG tablet TAKE 1 1/2 TABLET (750 MG TOTAL) BY MOUTH 2 (TWO ) TIMES DAILY  . metoprolol succinate (TOPROL-XL) 50 MG 24 hr tablet Take 1 tablet (50 mg total) by mouth daily.  Marland Kitchen spironolactone (ALDACTONE) 25 MG tablet Take 1 tablet (25 mg total) by mouth daily.  . [DISCONTINUED] escitalopram (LEXAPRO) 10 MG tablet Take 10 mg by mouth daily.  . [DISCONTINUED]  furosemide (LASIX) 40 MG tablet Take 40 mg by mouth.     Allergies:   Statins   Social History   Tobacco Use  . Smoking status: Never Smoker  . Smokeless tobacco: Never Used  Substance Use Topics  . Alcohol use: No  . Drug use: No     Family Hx: The patient's family history includes Healthy in his sister and sister; Heart Problems in his mother; Heart attack in his father.  ROS:   Please [name] the history of present illness.    Review of Systems  Cardiovascular: Positive for dyspnea on exertion.  Respiratory: Positive for shortness of breath.   Musculoskeletal: Positive for joint pain.   All other systems reviewed and are negative.   EKGs/Labs/Other Test Reviewed:    EKG:  EKG is  ordered today.  The ekg ordered today demonstrates normal sinus rhythm, HR 66, RBBB, no change since 11/23/17  Recent Labs: 09/08/2016: ALT 69; B Natriuretic Peptide 757.9; Hemoglobin 12.1; Platelets 172 11/23/2016: BUN 21; Creatinine, Ser 1.00; Potassium 4.9; Sodium 140   Recent Lipid Panel Lab Results  Component Value Date/Time   CHOL 166 09/09/2016 04:06 AM   TRIG 157 (H) 09/09/2016 04:06 AM   HDL 35 (L) 09/09/2016 04:06 AM  CHOLHDL 4.7 09/09/2016 04:06 AM   LDLCALC 100 (H) 09/09/2016 04:06 AM    Physical Exam:    VS:  BP 140/60   Pulse 66   Ht 5\' 10"  (1.778 m)   Wt 239 lb (108.4 kg)   SpO2 96%   BMI 34.29 kg/m     Wt Readings from Last 3 Encounters:  08/08/17 239 lb (108.4 kg)  11/23/16 232 lb 3.2 oz (105.3 kg)  10/07/16 229 lb (103.9 kg)     Physical Exam  Constitutional: He is oriented to person, place, and time. He appears well-developed and well-nourished. No distress.  HENT:  Head: Normocephalic and atraumatic.  Neck: No JVD present.  Cardiovascular: Normal rate and regular rhythm.  No murmur heard. Pulmonary/Chest: Effort normal. He has no rales.  Abdominal: Soft.  Musculoskeletal: He exhibits no edema.  Neurological: He is alert and oriented to person, place, and  time.  Skin: Skin is warm and dry.    ASSESSMENT & PLAN:    #1.  Shortness of breath  Etiology of his shortness of breath is not entirely clear.  He has experienced this over the last several.  He describes waking up at night feeling short of breath and has to go outside in the cold air to catch his breath.  He otherwise does not have any signs or symptoms of volume excess.  He went to the emergency room at [facility] in March 2019 with a negative workup.  He felt better with an anxiolytic at that time.  He did call into our office and had his Lasix cut in half.  Since that time, he has felt much better without recurrent symptoms.  Since he feels better on his current dose of Lasix, I have asked her to continue his current therapy.  I will obtain a follow-up echocardiogram.  He sees his primary care provider at the [place] later today.  He will need further workup for panic disorder +/- reactive airway disease.  #2.  Chronic systolic CHF (congestive heart failure) (HCC)  EF 35-40 by echo in 5/18.  He is NYHA 2.  He does not appear to be volume overloaded on exam.  Continue current therapy which includes losartan, metoprolol succinate, spironolactone.  Obtain follow-up echo as noted.  #3.  Essential hypertension The patient's blood pressure is controlled on his current regimen.  Continue current therapy.   #4.  Thoracic aortic aneurysm without rupture (HCC) Follow-up with Dr. Donata Clay as planned.   Dispo:  Return in about 6 months (around 02/07/2018) for Routine Follow Up, w/ Dr. [name].   Medication Adjustments/Labs and Tests Ordered: Current medicines are reviewed at length with the patient today.  Concerns regarding medicines are outlined above.  Tests Ordered: Orders Placed This Encounter  Procedures  . EKG 12-Lead  . ECHOCARDIOGRAM COMPLETE   Medication Changes: Meds ordered this encounter  Medications  . furosemide (LASIX) 20 MG tablet    Sig: Take 0.5 tablets (10 mg total) by mouth  daily.    Signed, [name], PA-C  08/08/2017 4:38 PM    [facility] [address] [place], [place], [place]  [address] Phone: [phone]; Fax: [phone]

## 2017-08-15 ENCOUNTER — Ambulatory Visit (HOSPITAL_COMMUNITY): Payer: Medicare Other | Attending: Internal Medicine

## 2017-08-15 ENCOUNTER — Other Ambulatory Visit: Payer: Self-pay

## 2017-08-15 DIAGNOSIS — R0602 Shortness of breath: Secondary | ICD-10-CM

## 2017-08-15 DIAGNOSIS — I5022 Chronic systolic (congestive) heart failure: Secondary | ICD-10-CM | POA: Diagnosis present

## 2017-08-15 DIAGNOSIS — I110 Hypertensive heart disease with heart failure: Secondary | ICD-10-CM | POA: Diagnosis not present

## 2017-08-15 DIAGNOSIS — E119 Type 2 diabetes mellitus without complications: Secondary | ICD-10-CM | POA: Insufficient documentation

## 2017-08-15 DIAGNOSIS — I080 Rheumatic disorders of both mitral and aortic valves: Secondary | ICD-10-CM | POA: Diagnosis not present

## 2017-08-16 ENCOUNTER — Telehealth: Payer: Self-pay | Admitting: *Deleted

## 2017-08-16 NOTE — Telephone Encounter (Signed)
Follow up    Please call with echo results 

## 2017-08-16 NOTE — Telephone Encounter (Signed)
-----   Message from Lori C Gerhardt, NP sent at 08/16/2017  7:49 AM EDT ----- Ok to report. Reviewed for Alan Weaver, PA His echo shows that his pumping function has improved in comparison to prior study.  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 VanTrigt For now, no change in current regimen.  Copy to PCP 

## 2017-08-16 NOTE — Telephone Encounter (Signed)
Lmtcb to go over echo results.  

## 2017-08-16 NOTE — Telephone Encounter (Signed)
Pt has been notified of echo results by phone with verbal understanding. Pt advised to continue his follow up with Dr. Maren Beach. Pt aware no changes to be made with his current regimen. I will fax a copy of results to Dr. Donata Clay as well as PCP Dr. Delbert Harness. Pt thanked me for my call today.

## 2017-08-16 NOTE — Telephone Encounter (Signed)
-----   Message from Rosalio Macadamia, NP sent at 08/16/2017  7:49 AM EDT ----- Ok to report. Reviewed for [name], PA His echo shows that his pumping function has improved in comparison to prior study.  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 Maren Beach For now, no change in current regimen.  Copy to PCP

## 2017-09-16 ENCOUNTER — Other Ambulatory Visit: Payer: Self-pay | Admitting: Cardiothoracic Surgery

## 2017-09-16 DIAGNOSIS — I712 Thoracic aortic aneurysm, without rupture, unspecified: Secondary | ICD-10-CM

## 2017-10-12 ENCOUNTER — Other Ambulatory Visit: Payer: Self-pay

## 2017-10-12 ENCOUNTER — Ambulatory Visit (INDEPENDENT_AMBULATORY_CARE_PROVIDER_SITE_OTHER): Payer: Medicare Other | Admitting: Cardiothoracic Surgery

## 2017-10-12 ENCOUNTER — Encounter: Payer: Self-pay | Admitting: Cardiothoracic Surgery

## 2017-10-12 ENCOUNTER — Ambulatory Visit
Admission: RE | Admit: 2017-10-12 | Discharge: 2017-10-12 | Disposition: A | Discharge status: Home / Self Care | Payer: Medicare Other | Source: Ambulatory Visit | Attending: Cardiothoracic Surgery | Admitting: Cardiothoracic Surgery

## 2017-10-12 VITALS — BP 114/76 | HR 64 | Resp 16 | Ht 70.0 in | Wt 232.0 lb

## 2017-10-12 DIAGNOSIS — I712 Thoracic aortic aneurysm, without rupture, unspecified: Secondary | ICD-10-CM

## 2017-10-12 MED ORDER — IOPAMIDOL (ISOVUE-370) INJECTION 76%
75.0000 mL | Freq: Once | INTRAVENOUS | Status: AC | PRN
  Administered 2017-10-12: 75 mL via INTRAVENOUS

## 2017-10-12 NOTE — Progress Notes (Signed)
PCP is No primary care provider on file. Referring Provider is Eddie North, MD  Chief Complaint  Patient presents with  . Thoracic Aortic Aneurysm    1 yr f/u with CTA CHEST    HPI: One year follow-up with CTA of thoracic aorta. 77 year old diabetic with well-controlled hypertension. Patient has an asymptomatic fusiform ascending aortic aneurysm stable at 4.9-5.0 cm since 2015.  He is a non-smoker.  He has non-ischemic cardiomyopathy with EF approximately 45% and mild aortic insufficiency by last echo.  He denies chest pain back pain or palpitation.  He is taking beta-blocker and losartan and is medically compliant.  He has a large BSA and the risk calculation of dissection for his fusiform aneurysm is low.    I reviewed the images of today's scan with the patient personally.  The ascending aorta remains stable at 4.9 cm.  There is no intimal ulceration or hematoma.  No other abnormalities on CT scan other than a well-documented thyroid nodule which he states his primary care physician has followed.  Past Medical History:  Diagnosis Date  . Arthritis   . Chronic systolic CHF (congestive heart failure) (HCC)   . Diabetes mellitus ORAL MED  . ED (erectile dysfunction)   . Glaucoma of right eye   . History of nuclear stress test    MV 1/18: EF 33, no ischemia; high risk  . History of prostate cancer   . Malfunction of artificial urethral sphincter (HCC)   . Malfunction of penile prosthesis (HCC)   . NICM (nonischemic cardiomyopathy) (HCC) 02/06/2015   LHC 11/14: EF 35, no significant CAD // Echo 7/16: Diff HK worse in inf base, EF 40%, Gr 1 DD, mild AI, mild MR, mod LAE // Echo 5/18: Moderate concentric LVH, EF 35-40, inferolateral and anterolateral AK, normal wall motion, moderate AI, moderate MR, moderate LAE, mild PI  . Nocturia   . SUI (stress urinary incontinence), male   . Thoracic aortic aneurysm without rupture (HCC) 02/06/2015   Chest CTA 1/18:  5.1 cm ascending aortic  aneurysm, mild cardiomegaly, mild coronary artery calcification, upper lobe small airway disease, cholelithiasis, 16 mm right thyroid nodule    Past Surgical History:  Procedure Laterality Date  . I & D/ ORIF RIGHT INDEX FINGER  01-19-2005  . INSERTION INFLATABLE PENILE PROSTHESIS  06-22-1999   MENTOR ALPHA I  . IR GENERIC HISTORICAL  06/17/2016   IR RADIOLOGIST EVAL & MGMT 06/17/2016 Jolaine Click, MD [facility] INTERV RAD  . LEFT HEART CATHETERIZATION WITH CORONARY ANGIOGRAM N/A 03/15/2013   Procedure: LEFT HEART CATHETERIZATION WITH CORONARY ANGIOGRAM;  Surgeon: [name], MD;  Location[facility] CATH LAB;  Service: Cardiovascular;  Laterality: N/A;  . PENILE PROSTHESIS IMPLANT  09/24/2011   Procedure: PENILE PROTHESIS INFLATABLE;  Surgeon: [name], MD;  Location: [facility];  Service: Urology;  Laterality: N/A;  REPLACEMENT OF INFLATABLE PENILE PROTHESIS (COLOPLAST)    . PLACEMENT ARTIFICIAL URINARY SPHINTER  01-20-2001   [id number]  . REMOVAL AND REPLACEMENT GU SPHINTER CUFF  05-15-2004   STRESS URINARY INCONTINENCE    Family History  Problem Relation Age of Onset  . Heart Problems Mother   . Heart attack Father   . Healthy Sister        NO HEART ISSUES  . Healthy Sister        NO HEART ISSUES    Social History Social History   Tobacco Use  . Smoking status: Never Smoker  . Smokeless tobacco: Never Used  Substance Use Topics  . Alcohol use: No  . Drug use: No    Current Outpatient Medications  Medication Sig Dispense Refill  . aspirin 81 MG tablet Take 81 mg by mouth daily.    . Cholecalciferol (VITAMIN D) 2000 UNITS CAPS Take 2,000 Units by mouth daily.     . dorzolamide-timolol (COSOPT) 22.3-6.8 MG/ML ophthalmic solution Place 1 drop into both eyes 2 (two) times daily.     . furosemide (LASIX) 20 MG tablet Take 0.5 tablets (10 mg total) by mouth daily.    Marland Kitchen glipiZIDE (GLUCOTROL) 10 MG tablet Take 10 mg by mouth 2 (two) times daily before a meal.     . latanoprost (XALATAN) 0.005 % ophthalmic solution Place 1 drop into both eyes at bedtime.     Marland Kitchen losartan (COZAAR) 25 MG tablet Take 25 mg by mouth daily.    . meclizine (ANTIVERT) 25 MG tablet Take 25 mg by mouth 2 (two) times daily.    . metFORMIN (GLUCOPHAGE) 500 MG tablet TAKE 1 1/2 TABLET (750 MG TOTAL) BY MOUTH 2 (TWO ) TIMES DAILY    . metoprolol succinate (TOPROL-XL) 50 MG 24 hr tablet Take 1 tablet (50 mg total) by mouth daily. 30 tablet 0  . spironolactone (ALDACTONE) 25 MG tablet Take 1 tablet (25 mg total) by mouth daily. 30 tablet 0   No current facility-administered medications for this visit.     Allergies  Allergen Reactions  . Statins Other (See Comments)    Other reaction(s): Other Myalgias.Marland KitchenMarland KitchenALL STATINS Myalgias.Marland KitchenMarland KitchenALL STATINS     Review of Systems  Weight stable no dental complaints No syncope or TIA No chest pain or upper back pain No orthopnea or PND No claudication No ankle edema No change in bowel habits or bleeding  BP 114/76 (BP Location: Right Arm, Patient Position: Sitting, Cuff Size: Large)   Pulse 64   Resp 16   Ht 5\' 10"  (1.778 m)   Wt 232 lb (105.2 kg)   SpO2 98% Comment: ON RA  BMI 33.29 kg/m  Physical Exam      Exam    General- alert and comfortable    Neck- no JVD, no cervical adenopathy palpable, no carotid bruit   Lungs- clear without rales, wheezes   Cor- regular rate and rhythm, no murmur , gallop   Abdomen- soft, non-tender   Extremities - warm, non-tender, minimal edema   Neuro- oriented, appropriate, no focal weakness   Diagnostic Tests: CTA images personally reviewed with patient noting a stable 4.9 cm fusiform aneurysm  Impression: Continued conservative management with surveillance CT scans annually and compliance with  blood pressure medication are the best therapy for this patient's aortic disease.  Plan: Return in 1 year with CTA of thoracic aorta. Importance of heart healthy diet and lifestyle emphasized to  patient.  Mikey Bussing, MD [facility] [phone]

## 2018-03-16 ENCOUNTER — Encounter: Payer: Self-pay | Admitting: Physician Assistant

## 2018-04-04 ENCOUNTER — Encounter: Payer: Self-pay | Admitting: Physician Assistant

## 2018-04-04 ENCOUNTER — Ambulatory Visit (INDEPENDENT_AMBULATORY_CARE_PROVIDER_SITE_OTHER): Payer: Medicare Other | Admitting: Physician Assistant

## 2018-04-04 VITALS — BP 110/60 | HR 66 | Ht 70.0 in | Wt 234.4 lb

## 2018-04-04 DIAGNOSIS — I712 Thoracic aortic aneurysm, without rupture, unspecified: Secondary | ICD-10-CM

## 2018-04-04 DIAGNOSIS — I5022 Chronic systolic (congestive) heart failure: Secondary | ICD-10-CM

## 2018-04-04 DIAGNOSIS — I351 Nonrheumatic aortic (valve) insufficiency: Secondary | ICD-10-CM | POA: Diagnosis not present

## 2018-04-04 DIAGNOSIS — I10 Essential (primary) hypertension: Secondary | ICD-10-CM

## 2018-04-04 NOTE — Patient Instructions (Signed)
Medication Instructions:  No changes.   If you need a refill on your cardiac medications before your next appointment, please call your pharmacy.   Lab work: None   If you have labs (blood work) drawn today and your tests are completely normal, you will receive your results only by: Marland Kitchen MyChart Message (if you have MyChart) OR . A paper copy in the mail If you have any lab test that is abnormal or we need to change your treatment, we will call you to review the results.  Testing/Procedures: None   Follow-Up: At [facility], you and your health needs are our priority.  As part of our continuing mission to provide you with exceptional heart care, we have created designated Provider Care Teams.  These Care Teams include your primary Cardiologist (physician) and Advanced Practice Providers (APPs -  Physician Assistants and Nurse Practitioners) who all work together to provide you with the care you need, when you need it. [name], PA-C in 6 months.  Any Other Special Instructions Will Be Listed Below (If Applicable).

## 2018-04-04 NOTE — Progress Notes (Signed)
Cardiology Office Note:    Date:  04/04/2018   ID:  Alan [name], DOB [birth year], MRN [id number]  PCP:  [facility]  Cardiologist:  Sherren Mocha, MD / Richardson Dopp, PA-C  Electrophysiologist:  None  TCTS:  Dr. Prescott Gum  Referring MD: No ref. provider found   Chief Complaint  Patient presents with  . Follow-up    CHF    History of Present Illness:    Alan [name] is a 77 y.o. male with NICM, ascending thoracic aortic aneurysm, mod AI, diabetes,HTN,prostate CA, thyroid nodule. EF has improved in the past. Echo in 2016 demonstrated EF 40%. Cardiac catheterization in 2014 demonstrated no significant CAD. CT angiogram in 04/2016 demonstrated stable ascending aortic aneurysm 5.1 cm. He was admitted in 1/18 with chest pain. Myoview demonstrated no ischemia. EF was 45-50% by echocardiogram. CHF medications were adjusted. He was admitted again [id number] a/c systolic HF.  Echo demonstrated EF 35-40%.An echo in 4/19 demonstrated improved LVF with and EF 62-70 and mild diastolic dysfunction.    Alan [name] returns for follow-up.  He is here alone.  He has been feeling well lately.  He denies any chest discomfort, shortness of breath, syncope, paroxysmal nocturnal dyspnea or lower extremity swelling.  Prior CV studies:   The following studies were reviewed today:  Echo 08/15/2017 Moderate LVH, EF 50-55, global HK, grade 1 diastolic dysfunction, mild AI, dilated ascending aorta (4.3 cm), MAC, mild MR, mild LAE  Echo 09/09/16 Moderate concentric LVH, EF 35-40,inferolateral and anterolateral AK, normal wall motion, moderate AI, moderate MR, moderate LAE, mild PI  Myoview 05/11/16 No ischemia, EF 33, high risk  Echo 7/16 Diff HK worse in inf base, EF 40%, Gr 1 DD, mild AI, mild MR, mod LAE  LHC 11/14 LM: minor irregularity. LAD: diffuse irregularities but no significant stenoses throughout the course of the LAD LCx: widely patent without  obstructive disease. RCA: widely patent.  Left ventriculography: inferolateral wall is akinetic. The anterolateral wall and apex appear hypokinetic. The basal inferior wall is hypokinetic. EF 35%.  Past Medical History:  Diagnosis Date  . Arthritis   . Chronic systolic CHF (congestive heart failure) ([place])   . Diabetes mellitus ORAL MED  . ED (erectile dysfunction)   . Glaucoma of right eye   . History of nuclear stress test    MV 1/18: EF 33, no ischemia; high risk  . History of prostate cancer   . Malfunction of artificial urethral sphincter ([place])   . Malfunction of penile prosthesis ([place])   . NICM (nonischemic cardiomyopathy) ([place]) 02/06/2015   LHC 11/14: EF 35, no significant CAD // Echo 7/16: Diff HK worse in inf base, EF 40%, Gr 1 DD, mild AI, mild MR, mod LAE // Echo 5/18: Moderate concentric LVH, EF 35-40, inferolateral and anterolateral AK, normal wall motion, moderate AI, moderate MR, moderate LAE, mild PI  . Nocturia   . SUI (stress urinary incontinence), male   . Thoracic aortic aneurysm without rupture ([place]) 02/06/2015   Chest CTA 1/18:  5.1 cm ascending aortic aneurysm, mild cardiomegaly, mild coronary artery calcification, upper lobe small airway disease, cholelithiasis, 16 mm right thyroid nodule   Surgical Hx: The patient  has a past surgical history that includes I & D/ ORIF RIGHT INDEX FINGER (01-19-2005); REMOVAL AND REPLACEMENT GU SPHINTER CUFF (05-15-2004); PLACEMENT ARTIFICIAL URINARY SPHINTER (01-20-2001); INSERTION INFLATABLE PENILE PROSTHESIS (06-22-1999); Penile prosthesis implant (09/24/2011); left heart catheterization with coronary angiogram (N/A, 03/15/2013); and ir generic historical (06/17/2016).  Current Medications: Current Meds  Medication Sig  . aspirin 81 MG tablet Take 81 mg by mouth daily.  Marland Kitchen b complex vitamins tablet Take 1 tablet by mouth daily.  . brimonidine (ALPHAGAN) 0.2 % ophthalmic solution Place 2 drops into both eyes daily.  .  busPIRone (BUSPAR) 10 MG tablet Take 5 mg by mouth at bedtime.  . Cholecalciferol (VITAMIN D) 2000 UNITS CAPS Take 2,000 Units by mouth daily.   . dorzolamide-timolol (COSOPT) 22.3-6.8 MG/ML ophthalmic solution Place 1 drop into both eyes 2 (two) times daily.   . furosemide (LASIX) 20 MG tablet Take 0.5 tablets (10 mg total) by mouth daily.  Marland Kitchen glipiZIDE (GLUCOTROL) 10 MG tablet Take 10 mg by mouth 2 (two) times daily before a meal.  . latanoprost (XALATAN) 0.005 % ophthalmic solution Place 1 drop into both eyes at bedtime.   Marland Kitchen losartan (COZAAR) 25 MG tablet Take 25 mg by mouth daily.  . meclizine (ANTIVERT) 25 MG tablet Take 25 mg by mouth 2 (two) times daily.  . metFORMIN (GLUCOPHAGE) 500 MG tablet TAKE 1 1/2 TABLET (750 MG TOTAL) BY MOUTH 2 (TWO ) TIMES DAILY  . metoprolol succinate (TOPROL-XL) 50 MG 24 hr tablet Take 1 tablet (50 mg total) by mouth daily.  Marland Kitchen spironolactone (ALDACTONE) 25 MG tablet Take 1 tablet (25 mg total) by mouth daily.     Allergies:   Statins   Social History   Tobacco Use  . Smoking status: Never Smoker  . Smokeless tobacco: Never Used  Substance Use Topics  . Alcohol use: No  . Drug use: No     Family Hx: The patient's family history includes Healthy in his sister and sister; Heart Problems in his mother; Heart attack in his father.  ROS:   Please see the history of present illness.    ROS All other systems reviewed and are negative.   EKGs/Labs/Other Test Reviewed:    EKG:  EKG is  ordered today.  The ekg ordered today demonstrates normal sinus rhythm, heart rate 66, left axis deviation, right bundle branch block, PR 240, QTC 480, no change from prior tracing  Recent Labs: No results found for requested labs within last 8760 hours.   Recent Lipid Panel Lab Results  Component Value Date/Time   CHOL 166 09/09/2016 04:06 AM   TRIG 157 (H) 09/09/2016 04:06 AM   HDL 35 (L) 09/09/2016 04:06 AM   CHOLHDL 4.7 09/09/2016 04:06 AM   LDLCALC 100 (H)  09/09/2016 04:06 AM    Physical Exam:    VS:  BP 110/60   Pulse 66   Ht _0  (1.778 m)   Wt 234 lb 6.4 oz (106.3 kg)   SpO2 95%   BMI 33.63 kg/m     Wt Readings from Last 3 Encounters:  04/04/18 234 lb 6.4 oz (106.3 kg)  10/12/17 232 lb (105.2 kg)  08/08/17 239 lb (108.4 kg)     Physical Exam  Constitutional: He is oriented to person, place, and time. He appears well-developed and well-nourished. No distress.  HENT:  Head: Normocephalic and atraumatic.  Eyes: No scleral icterus.  Neck: No JVD present. No thyromegaly present.  Cardiovascular: Normal rate and regular rhythm.  No murmur heard. Pulmonary/Chest: Effort normal. He has no rales.  Abdominal: Soft. He exhibits no distension.  Musculoskeletal: He exhibits no edema.  Lymphadenopathy:    He has no cervical adenopathy.  Neurological: He is alert and oriented to person, place, and time.  Skin: Skin is  warm and dry.  Psychiatric: He has a normal mood and affect.    ASSESSMENT & PLAN:    Chronic systolic CHF (congestive heart failure) (HCC) Nonischemic cardiomyopathy.  EF was previously 35-40.  Echocardiogram in April 2019 demonstrated normal LV function.  He is NYHA 2.  Volume status appears stable.  Continue current medical regimen which includes losartan, metoprolol succinate and spironolactone.  Request recent BMET from the [place].  Aortic valve insufficiency, etiology of cardiac valve disease unspecified Mild aortic insufficiency by most recent echocardiogram.  Thoracic aortic aneurysm without rupture ([place]) Continue follow-up with Dr. Prescott Gum for surveillance.  Essential hypertension The patient's blood pressure is controlled on his current regimen.  Continue current therapy.    Dispo:  Return in about 6 months (around 10/04/2018) for Routine Follow Up, w/ Richardson Dopp, PA-C.   Medication Adjustments/Labs and Tests Ordered: Current medicines are reviewed at length with the patient today.  Concerns  regarding medicines are outlined above.  Tests Ordered: No orders of the defined types were placed in this encounter.  Medication Changes: No orders of the defined types were placed in this encounter.   Signed, Richardson Dopp, PA-C  04/04/2018 3:24 PM    [facility] [place] [address], [place], [place]  [address] Phone: [phone]; Fax: [phone]

## 2018-05-02 ENCOUNTER — Encounter: Payer: Self-pay | Admitting: Physician Assistant

## 2018-08-03 ENCOUNTER — Other Ambulatory Visit: Payer: Self-pay | Admitting: *Deleted

## 2018-08-03 DIAGNOSIS — I712 Thoracic aortic aneurysm, without rupture, unspecified: Secondary | ICD-10-CM

## 2018-09-06 ENCOUNTER — Telehealth (INDEPENDENT_AMBULATORY_CARE_PROVIDER_SITE_OTHER): Payer: Medicare Other | Admitting: Physician Assistant

## 2018-09-06 ENCOUNTER — Telehealth: Payer: Self-pay | Admitting: *Deleted

## 2018-09-06 ENCOUNTER — Telehealth: Payer: Self-pay | Admitting: Physician Assistant

## 2018-09-06 ENCOUNTER — Encounter: Payer: Self-pay | Admitting: Physician Assistant

## 2018-09-06 ENCOUNTER — Other Ambulatory Visit: Payer: Self-pay

## 2018-09-06 VITALS — Ht 70.0 in | Wt 240.0 lb

## 2018-09-06 DIAGNOSIS — I712 Thoracic aortic aneurysm, without rupture, unspecified: Secondary | ICD-10-CM

## 2018-09-06 DIAGNOSIS — R0789 Other chest pain: Secondary | ICD-10-CM

## 2018-09-06 DIAGNOSIS — I351 Nonrheumatic aortic (valve) insufficiency: Secondary | ICD-10-CM

## 2018-09-06 DIAGNOSIS — I5022 Chronic systolic (congestive) heart failure: Secondary | ICD-10-CM

## 2018-09-06 DIAGNOSIS — I10 Essential (primary) hypertension: Secondary | ICD-10-CM

## 2018-09-06 NOTE — Telephone Encounter (Signed)
[facility] Home Visit Initial Request  Agency Requested:    [facility] Contact:  Alcide Clever, NP [address] [place], [place] [address] Phone #:  [phone] Fax #:  [phone]  Patient Demographic Information: Name:  Alan [name] Age:  78 y.o.   DOB:  06/13/1940  MRN:  [id number]   Address:   [address] [place] [address]   Phone Numbers:   Home Phone [phone]  Mobile [phone]     Emergency Contact Information on File:   Contact Information    Name Relation Home Work Mobile   Chapman,Michelle Daughter   [phone]      The above family members may be contacted for information on this patient (review DPR on file):  Yes    Patient Clinical Information:  Primary Care Provider:  [facility]  Primary Cardiologist:  [name], MD  Primary Electrophysiologist:  None   Requesting Provider:  [name], PA-C     Past Medical Hx: Mr. Mizzell  has a past medical history of Arthritis, Chronic systolic CHF (congestive heart failure) (HCC), Diabetes mellitus (ORAL MED), ED (erectile dysfunction), Glaucoma of right eye, History of nuclear stress test, History of prostate cancer, Malfunction of artificial urethral sphincter (HCC), Malfunction of penile prosthesis (HCC), NICM (nonischemic cardiomyopathy) (HCC) (02/06/2015), Nocturia, SUI (stress urinary incontinence), male, and Thoracic aortic aneurysm without rupture (HCC) (02/06/2015).   Allergies: He is allergic to statins.   Medications: Current Outpatient Medications on File Prior to Visit  Medication Sig  . aspirin 81 MG tablet Take 81 mg by mouth daily.  Marland Kitchen b complex vitamins tablet Take 1 tablet by mouth daily.  . brimonidine (ALPHAGAN) 0.2 % ophthalmic solution Place 2 drops into both eyes daily.  . busPIRone (BUSPAR) 10 MG tablet Take 10 mg by mouth at bedtime as needed.   . Cholecalciferol (VITAMIN D) 2000 UNITS CAPS Take 2,000  Units by mouth daily.   . dorzolamide-timolol (COSOPT) 22.3-6.8 MG/ML ophthalmic solution Place 1 drop into both eyes 2 (two) times daily.   . furosemide (LASIX) 20 MG tablet Take 0.5 tablets (10 mg total) by mouth daily.  Marland Kitchen glipiZIDE (GLUCOTROL) 10 MG tablet Take 10 mg by mouth 2 (two) times daily before a meal.  . latanoprost (XALATAN) 0.005 % ophthalmic solution Place 1 drop into both eyes at bedtime.   Marland Kitchen losartan (COZAAR) 25 MG tablet Take 25 mg by mouth daily.  . meclizine (ANTIVERT) 25 MG tablet Take 25 mg by mouth daily.   . metFORMIN (GLUCOPHAGE) 500 MG tablet Take 1000mg  in the am and 500mg  in the evening by mouth daily  . metoprolol succinate (TOPROL-XL) 50 MG 24 hr tablet Take 1 tablet (50 mg total) by mouth daily.  Marland Kitchen spironolactone (ALDACTONE) 25 MG tablet Take 1 tablet (25 mg total) by mouth daily.   No current facility-administered medications on file prior to visit.      Social Hx: He  reports that he has never smoked. He has never used smokeless tobacco. He reports that he does not drink alcohol or use drugs.    Diagnosis/Reason for Visit:   Chest pain, shortness of breath   Services Requested:  Vital Signs (BP, Pulse, O2, Weight)  Physical Exam  Rhythm Strip (AliveCor Device)  Labs:  BMET, CBC, BNP  Please check BP in both arms and record.  # of Visits Needed/Frequency per Week: 1   A copy of the office note will be faxed with  this form.  All labs ordered for this home visit have been released.

## 2018-09-06 NOTE — Telephone Encounter (Signed)
New message:   Patient calling concering having some SOB and his chest hurt some times. Patient can not sleep well, and SOB when he sleeping. Please call patient.

## 2018-09-06 NOTE — Progress Notes (Signed)
Virtual Visit via Telephone Note   This visit type was conducted due to national recommendations for restrictions regarding the COVID-19 Pandemic (e.g. social distancing) in an effort to limit this patient's exposure and mitigate transmission in our community.  Due to his co-morbid illnesses, this patient is at least at moderate risk for complications without adequate follow up.  This format is felt to be most appropriate for this patient at this time.  The patient did not have access to video technology/had technical difficulties with video requiring transitioning to audio format only (telephone).  All issues noted in this document were discussed and addressed.  No physical exam could be performed with this format.  Please refer to the patient's chart for his  consent to telehealth for [facility].   Date:  09/06/2018   ID:  Bebe Liter, DOB 1940-09-07, MRN [id number]  Patient Location: Home Provider Location: Home  PCP:  [facility]  Cardiologist:  Sherren Mocha, MD / Richardson Dopp, PA-C  Electrophysiologist:  None  TCTS:  Dr. Prescott Gum  Evaluation Performed:  Follow-Up Visit  Chief Complaint: Chest pain, shortness of breath  History of Present Illness:    Alan [name] is a 78 y.o. male with NICM, ascending thoracic aortic aneurysm, mod AI, diabetes,HTN,prostate CA, thyroid nodule. EF has improved in the past. Echo in 2016 demonstrated EF 40%. Cardiac catheterization in 2014 demonstrated no significant CAD. CT angiogram in 04/2016 demonstrated stable ascending aortic aneurysm 5.1 cm. Myoview in January 2018 demonstrated no ischemia. EF was 45-50% by echocardiogram in January 2018.  He was admitted [id number] a/c systolic HF.  Echo demonstrated EF 35-40%.An follow-up echo in 4/19 demonstrated improved LVF with and EF 40-08 and mild diastolic dysfunction.    He was last seen in December 2019.  He called in today with complaints of chest pain and  shortness of breath.  He notes that he has been having chest discomfort for the past 6 days.  It seems to be worse with positional changes.  He has not had any ripping or tearing sensation.  Although his pain is increased with activity, it does not seem to be exertional.  It is also worse with palpation.  He has also been having trouble with dysphasia, early satiety and increased mucus.  He has not had significant cough or fever.  He does note shortness of breath with minimal activity.  This is a chronic symptom but seems to have been worse over the past week or 2.  He has been sleeping in a recliner.  He has not had paroxysmal nocturnal dyspnea.  He has some mild pedal edema.  He does admit to poor compliance with limiting salt.  He has not had syncope or near syncope.  Of note, his primary care provider at the [facility] recently sent him a prescription for omeprazole.  He does not have a way to check his blood pressure.  The patient does not have symptoms concerning for COVID-19 infection (fever, chills, cough, or new shortness of breath).    Past Medical History:  Diagnosis Date   Arthritis    Chronic systolic CHF (congestive heart failure) (HCC)    Diabetes mellitus ORAL MED   ED (erectile dysfunction)    Glaucoma of right eye    History of nuclear stress test    MV 1/18: EF 33, no ischemia; high risk   History of prostate cancer    Malfunction of artificial urethral sphincter (HCC)    Malfunction of  penile prosthesis (HCC)    NICM (nonischemic cardiomyopathy) ([place]) 02/06/2015   LHC 11/14: EF 35, no significant CAD // Echo 7/16: Diff HK worse in inf base, EF 40%, Gr 1 DD, mild AI, mild MR, mod LAE // Echo 5/18: Moderate concentric LVH, EF 35-40, inferolateral and anterolateral AK, normal wall motion, moderate AI, moderate MR, moderate LAE, mild PI   Nocturia    SUI (stress urinary incontinence), male    Thoracic aortic aneurysm without rupture ([place]) 02/06/2015   Chest CTA 1/18:  5.1  cm ascending aortic aneurysm, mild cardiomegaly, mild coronary artery calcification, upper lobe small airway disease, cholelithiasis, 16 mm right thyroid nodule   Past Surgical History:  Procedure Laterality Date   I & D/ ORIF RIGHT INDEX FINGER  01-19-2005   INSERTION INFLATABLE PENILE PROSTHESIS  06-22-1999   MENTOR ALPHA I   IR GENERIC HISTORICAL  06/17/2016   IR RADIOLOGIST EVAL & MGMT 06/17/2016 Marybelle Killings, MD [facility] INTERV RAD   LEFT HEART CATHETERIZATION WITH CORONARY ANGIOGRAM N/A 03/15/2013   Procedure: LEFT HEART CATHETERIZATION WITH CORONARY ANGIOGRAM;  Surgeon: Blane Ohara, MD;  Location: [facility] CATH LAB;  Service: Cardiovascular;  Laterality: N/A;   PENILE PROSTHESIS IMPLANT  09/24/2011   Procedure: PENILE PROTHESIS INFLATABLE;  Surgeon: Claybon Jabs, MD;  Location[facility];  Service: Urology;  Laterality: N/A;  REPLACEMENT OF INFLATABLE PENILE PROTHESIS (COLOPLAST)     PLACEMENT ARTIFICIAL URINARY SPHINTER  01-20-2001   [id number]   REMOVAL AND REPLACEMENT GU SPHINTER CUFF  05-15-2004   STRESS URINARY INCONTINENCE     Current Meds  Medication Sig   aspirin 81 MG tablet Take 81 mg by mouth daily.   b complex vitamins tablet Take 1 tablet by mouth daily.   brimonidine (ALPHAGAN) 0.2 % ophthalmic solution Place 2 drops into both eyes daily.   busPIRone (BUSPAR) 10 MG tablet Take 10 mg by mouth at bedtime as needed.    Cholecalciferol (VITAMIN D) 2000 UNITS CAPS Take 2,000 Units by mouth daily.    dorzolamide-timolol (COSOPT) 22.3-6.8 MG/ML ophthalmic solution Place 1 drop into both eyes 2 (two) times daily.    furosemide (LASIX) 20 MG tablet Take 0.5 tablets (10 mg total) by mouth daily.   glipiZIDE (GLUCOTROL) 10 MG tablet Take 10 mg by mouth 2 (two) times daily before a meal.   latanoprost (XALATAN) 0.005 % ophthalmic solution Place 1 drop into both eyes at bedtime.    losartan (COZAAR) 25 MG tablet Take 25 mg by mouth daily.    meclizine (ANTIVERT) 25 MG tablet Take 25 mg by mouth daily.    metFORMIN (GLUCOPHAGE) 500 MG tablet Take 1079m in the am and 5015min the evening by mouth daily   metoprolol succinate (TOPROL-XL) 50 MG 24 hr tablet Take 1 tablet (50 mg total) by mouth daily.   spironolactone (ALDACTONE) 25 MG tablet Take 1 tablet (25 mg total) by mouth daily.     Allergies:   Statins   Social History   Tobacco Use   Smoking status: Never Smoker   Smokeless tobacco: Never Used  Substance Use Topics   Alcohol use: No   Drug use: No     Family Hx: The patient's family history includes Healthy in his sister and sister; Heart Problems in his mother; Heart attack in his father.  ROS:   Please see the history of present illness.     All other systems reviewed and are negative.   Prior CV studies:  The following studies were reviewed today:   Chest CTA 10/12/2017 IMPRESSION: Grossly stable 4.9 cm ascending thoracic aortic aneurysm. Recommend semi-annual imaging followup by CTA or MRA and referral to cardiothoracic surgery if not already obtained. This recommendation follows 2010 ACCF/AHA/AATS/ACR/ASA/SCA/SCAI/SIR/STS/SVM Guidelines for the Diagnosis and Management of Patients With Thoracic Aortic Disease. Circulation. 2010; 121: I097-D532.  Stable 16 mm right thyroid nodule. Further evaluation with thyroid ultrasound is recommended if not already performed.  Cholelithiasis.   Echo 08/15/2017 Moderate LVH, EF 50-55, global HK, grade 1 diastolic dysfunction, mild AI, dilated ascending aorta (4.3 cm), MAC, mild MR, mild LAE  Echo 09/09/16 Moderate concentric LVH, EF 35-40,inferolateral and anterolateral AK, normal wall motion, moderate AI, moderate MR, moderate LAE, mild PI  Myoview 05/11/16 No ischemia, EF 33, high risk  Echo 7/16 Diff HK worse in inf base, EF 40%, Gr 1 DD, mild AI, mild MR, mod LAE  LHC 11/14 LM: minor irregularity. LAD: diffuse irregularities but no  significant stenoses throughout the course of the LAD LCx: widely patent without obstructive disease. RCA: widely patent.  Left ventriculography: inferolateral wall is akinetic. The anterolateral wall and apex appear hypokinetic. The basal inferior wall is hypokinetic. EF 35%.  Labs/Other Tests and Data Reviewed:    EKG:  No ECG reviewed.  Recent Labs: No results found for requested labs within last 8760 hours.   Recent Lipid Panel Lab Results  Component Value Date/Time   CHOL 166 09/09/2016 04:06 AM   TRIG 157 (H) 09/09/2016 04:06 AM   HDL 35 (L) 09/09/2016 04:06 AM   CHOLHDL 4.7 09/09/2016 04:06 AM   LDLCALC 100 (H) 09/09/2016 04:06 AM    Wt Readings from Last 3 Encounters:  09/06/18 240 lb (108.9 kg)  04/04/18 234 lb 6.4 oz (106.3 kg)  10/12/17 232 lb (105.2 kg)     Objective:    Vital Signs:  Ht _0  (1.778 m)    Wt 240 lb (108.9 kg)    BMI 34.44 kg/m    VITAL SIGNS:  reviewed GEN:  no acute distress RESPIRATORY:  No labored breathing NEURO:  Alert and oriented PSYCH:  He seems to be in good spirits  ASSESSMENT & PLAN:     Other chest pain His chest pain is somewhat atypical for ischemia.  He seems to have more symptoms with positional changes.  He is also had issues with dysphasia, early satiety and increased mucus.  Question if his symptoms may be related to a gastrointestinal issue.  His symptoms are also suspicious for musculoskeletal etiology.  Cardiac catheterization in 2014 did not demonstrate any significant CAD.  At this point, I do not believe that further ischemic testing is needed.  He does seem to be somewhat volume overloaded.  His symptoms may be related to heart failure as well.  I will arrange a home visit for labs and exam.  I have asked him to take the omeprazole that his primary care provider prescribed to him.  I have also asked him to follow-up with his primary care provider at some point in the next 2 weeks to review his  symptoms.  Chronic systolic CHF (congestive heart failure) ([place]) Most recent echocardiogram demonstrated EF 50-55.  EF has been as low as 35-40.  His weight is up and he has been sleeping on an incline recently.  I suspect some of his shortness of breath may be related to volume excess.  I have asked him to increase his Lasix to 20 mg daily for 3  days.  I will arrange a home visit for an exam, labs (BMET, BNP, CBC) and a rhythm strip.  He has been advised to take the omeprazole as outlined above and follow-up with his primary care provider.  I will arrange follow-up with me in the next 7 to 14 days.  Aortic valve insufficiency, etiology of cardiac valve disease unspecified Mild AI by echo in 4/19.  Thoracic aortic aneurysm without rupture (HCC) 4.9 cm by CT in 6/19.  He has a follow-up CT pending next month as well as follow-up with Dr. Prescott Gum.  His symptoms do not sound suspicious for worsening thoracic aortic aneurysm.  Essential hypertension He is unable to check his blood pressure.  COVID-19 Education: The signs and symptoms of COVID-19 were discussed with the patient and how to seek care for testing (follow up with PCP or arrange E-visit).  The importance of social distancing was discussed today.  Time:   Today, I have spent 22 minutes with the patient with telehealth technology discussing the above problems.     Medication Adjustments/Labs and Tests Ordered: Current medicines are reviewed at length with the patient today.  Concerns regarding medicines are outlined above.   Tests Ordered: Orders Placed This Encounter  Procedures   Basic metabolic panel   Pro b natriuretic peptide (BNP)   CBC    Medication Changes: No orders of the defined types were placed in this encounter.   Disposition:  Follow up in 2 week(s)  Signed, Richardson Dopp, PA-C  09/06/2018 4:16 PM    [facility]

## 2018-09-06 NOTE — Telephone Encounter (Signed)
Called patient back about his message. Patient stated he woke up 6 days ago with soreness in his chest. Patient stated it comes and goes and as long as he is not doing anything he does not feel the soreness. Patient stated the has been going on for 3 to 4 days. Patient also complaining about SOB. Patient stated he was SOB with activity, but it seems to be almost all the time now. Patient did not sound SOB on the phone. Made patient a virtual visit appointment with [name] PA this afternoon. Encouraged patient to go to ED if his symptoms got worse. Patient verbalized understanding.

## 2018-09-06 NOTE — Telephone Encounter (Signed)
Virtual Visit Pre-Appointment Phone Call  "(Name), I am calling you today to discuss your upcoming appointment. We are currently trying to limit exposure to the virus that causes COVID-19 by seeing patients at home rather than in the office."  1. "What is the BEST phone number to call the day of the visit?" - include this in appointment notes  2. "Do you have or have access to (through a family member/friend) a smartphone with video capability that we can use for your visit?" a. If yes - list this number in appt notes as "cell" (if different from BEST phone #) and list the appointment type as a VIDEO visit in appointment notes b. If no - list the appointment type as a PHONE visit in appointment notes  3. Confirm consent - "In the setting of the current Covid19 crisis, you are scheduled for a (phone or video) visit with your provider on (date) at (time).  Just as we do with many in-office visits, in order for you to participate in this visit, we must obtain consent.  If you'd like, I can send this to your mychart (if signed up) or email for you to review.  Otherwise, I can obtain your verbal consent now.  All virtual visits are billed to your insurance company just like a normal visit would be.  By agreeing to a virtual visit, we'd like you to understand that the technology does not allow for your provider to perform an examination, and thus may limit your provider's ability to fully assess your condition. If your provider identifies any concerns that need to be evaluated in person, we will make arrangements to do so.  Finally, though the technology is pretty good, we cannot assure that it will always work on either your or our end, and in the setting of a video visit, we may have to convert it to a phone-only visit.  In either situation, we cannot ensure that we have a secure connection.  Are you willing to proceed?" STAFF: Did the patient verbally acknowledge consent to telehealth visit? Document  YES/NO here: yes  4. Advise patient to be prepared - "Two hours prior to your appointment, go ahead and check your blood pressure, pulse, oxygen saturation, and your weight (if you have the equipment to check those) and write them all down. When your visit starts, your provider will ask you for this information. If you have an Apple Watch or Kardia device, please plan to have heart rate information ready on the day of your appointment. Please have a pen and paper handy nearby the day of the visit as well."  5. Give patient instructions for MyChart download to smartphone OR Doximity/Doxy.me as below if video visit (depending on what platform provider is using)  6. Inform patient they will receive a phone call 15 minutes prior to their appointment time (may be from unknown caller ID) so they should be prepared to answer    TELEPHONE CALL NOTE  Henrik Vidaurri has been deemed a candidate for a follow-up tele-health visit to limit community exposure during the Covid-19 pandemic. I spoke with the patient via phone to ensure availability of phone/video source, confirm preferred email & phone number, and discuss instructions and expectations.  I reminded Moses Meier to be prepared with any vital sign and/or heart rhythm information that could potentially be obtained via home monitoring, at the time of his visit. I reminded Tzvi Pott to expect a phone call prior to  his visit.  Orie Fisherman, CMA 09/06/2018 12:26 PM   INSTRUCTIONS FOR DOWNLOADING THE MYCHART APP TO SMARTPHONE  - The patient must first make sure to have activated MyChart and know their login information - If Apple, go to [employer] and type in MyChart in the search bar and download the app. If Android, ask patient to go to [employer] and type in [place] in the search bar and download the app. The app is free but as with any other app downloads, their phone may require them to verify saved payment  information or Apple/Android password.  - The patient will need to then log into the app with their MyChart username and password, and select [facility] as their healthcare provider to link the account. When it is time for your visit, go to the MyChart app, find appointments, and click Begin Video Visit. Be sure to Select Allow for your device to access the Microphone and Camera for your visit. You will then be connected, and your provider will be with you shortly.  **If they have any issues connecting, or need assistance please contact MyChart service desk [phone])**  **If using a computer, in order to ensure the best quality for their visit they will need to use either of the following Internet Browsers: [employer] or Google Chrome**  IF USING DOXIMITY or DOXY.ME - The patient will receive a link just prior to their visit by text.     FULL LENGTH CONSENT FOR TELE-HEALTH VISIT   I hereby voluntarily request, consent and authorize [facility] and its employed or contracted physicians, physician assistants, nurse practitioners or other licensed health care professionals (the Practitioner), to provide me with telemedicine health care services (the "Services") as deemed necessary by the treating Practitioner. I acknowledge and consent to receive the Services by the Practitioner via telemedicine. I understand that the telemedicine visit will involve communicating with the Practitioner through live audiovisual communication technology and the disclosure of certain medical information by electronic transmission. I acknowledge that I have been given the opportunity to request an in-person assessment or other available alternative prior to the telemedicine visit and am voluntarily participating in the telemedicine visit.  I understand that I have the right to withhold or withdraw my consent to the use of telemedicine in the course of my care at any time, without affecting my right  to future care or treatment, and that the Practitioner or I may terminate the telemedicine visit at any time. I understand that I have the right to inspect all information obtained and/or recorded in the course of the telemedicine visit and may receive copies of available information for a reasonable fee.  I understand that some of the potential risks of receiving the Services via telemedicine include:  Marland Kitchen Delay or interruption in medical evaluation due to technological equipment failure or disruption; . Information transmitted may not be sufficient (e.g. poor resolution of images) to allow for appropriate medical decision making by the Practitioner; and/or  . In rare instances, security protocols could fail, causing a breach of personal health information.  Furthermore, I acknowledge that it is my responsibility to provide information about my medical history, conditions and care that is complete and accurate to the best of my ability. I acknowledge that Practitioner's advice, recommendations, and/or decision may be based on factors not within their control, such as incomplete or inaccurate data provided by me or distortions of diagnostic images or specimens that may result from electronic transmissions. I understand  that the practice of medicine is not an exact science and that Practitioner makes no warranties or guarantees regarding treatment outcomes. I acknowledge that I will receive a copy of this consent concurrently upon execution via email to the email address I last provided but may also request a printed copy by calling the office of [facility].    I understand that my insurance will be billed for this visit.   I have read or had this consent read to me. . I understand the contents of this consent, which adequately explains the benefits and risks of the Services being provided via telemedicine.  . I have been provided ample opportunity to ask questions regarding this consent and the Services  and have had my questions answered to my satisfaction. . I give my informed consent for the services to be provided through the use of telemedicine in my medical care  By participating in this telemedicine visit I agree to the above.

## 2018-09-06 NOTE — Telephone Encounter (Signed)
Label Here   [facility]  E-Req  [place]           [facility] TM E-Req [facility] Health Page 1 of 1  Account #: [id number] Collection Date:       Req/Control #: [id number] Collection Time:       Client / Ordering Site Information: Physician Information:  Account Name: [facility] Office  Address 1: [address], [address]  Address 2:   [place], [place] Zip: [place], [place] [address]  Phone: [phone]   Ordering: Beatrice Lecher  Degree: PA-C  NPI: [id number]  UPIN: [id number]  Physician ID:       Patient Information:  Name: Alan [name]  Gender: Male  Date of Birth: 1940/08/07  Age: 78 years  Address: [address]  [place], [place] Zip: [place], [place] [address]   SSN: [id number]  Patient ID: [id number]  Phone: [phone]     Alt Control#: [id number]           Comments:      Order Code Tests Ordered (Total: 3)   Order Code Tests Ordered    [phone] CBC  143000 Pro b natriuretic peptide (BNP)   263785 Basic metabolic panel          Diagnosis Codes:  R07.89 I50.22        Bill Type: Third-Party   Carrier Code 05         Responsible Party / Guarantor Information:  Name: Alan [name]  Address: [address], [place] Zip: [place], [place] [address]  Phone: [phone]  Relation to Pt: Self  Employer Name:       ABN:    Worker's Comp: N Date of Injury:         Insurance Information:  Primary Insurance: Secondary Insurance:  Ins Co Name: MEDICARE PART A AND B  Address 1: [address]  Address 2:   [place], [place] Zip: [place], [place] [address]  Policy Number: [id number]  Group #:    Ins Co Name: TRIC[employer] FOR LIFE  Address 1: [address]  Address 2:   [place], [place] [address]  Policy Number: [id number]  Group #:     Primary Policy Holder / Insured: Secondary Policy Holder / Insured:  Name: BRISTOL, GINLEY  Address: [address]   [place], [place] [address]  Pt Relation to Subscriber: Self    Name: Alan [name]  Address: [address]   [place], [place] [address]  Pt Relation to Subscriber: Self      Authorization - Please Sign and Date I hereby authorize the release of medical information related to the services described hereon and authorize payment directly to [facility].   __________________________________                    ______________ Patient Signature                                                                    Date   __________________________________                     ______________ Physician Signature  Date     RIEN, PATRICE  12-21-1940 [id number]  MRN: [id number]  Coll Dt/Time: Hilda Blades PAUL  08/02/1940 [id number]  MRN: [id number]  Coll Dt/Time: Hilda Blades PAUL  15-Nov-1940 [id number]  MRN: [id number]  Coll Dt/Time: DELMAS, KALKOWSKI  April 12, 1941 [id number]  MRN: [id number]  Coll Dt/Time: MEHMED, STODGHILL  Aug 23, 1940 [id number]  MRN: [id number]  Coll Dt/Time: ADIL, CIOCCA  Apr 04, 1941 [id number]  MRN: [id number]  Coll Dt/Time: MARKEZ, ULRICH  Feb 25, 1941 [id number]  MRN: [id number]  Coll Dt/Time: SHAMAAR, KEMPSON  09-12-1940 [id number]  MRN: [id number]  Coll Dt/Time: -

## 2018-09-06 NOTE — Telephone Encounter (Signed)
See my note from 09/06/2018. [name], PA-C    09/06/2018 4:15 PM

## 2018-09-06 NOTE — Patient Instructions (Signed)
Medication Instructions:  Increase Lasix to 20 mg daily for 3 days, then resume 10 mg daily Take the omeprazole that your primary care provider at the [facility] provided for you.  If you need a refill on your cardiac medications before your next appointment, please call your pharmacy.   Lab work: We will arrange a home visit for labs: BMET, CBC, BNP  If you have labs (blood work) drawn today and your tests are completely normal, you will receive your results only by: Marland Kitchen MyChart Message (if you have MyChart) OR . A paper copy in the mail If you have any lab test that is abnormal or we need to change your treatment, we will call you to review the results.  Testing/Procedures: None  Follow-Up: At [facility], you and your health needs are our priority.  As part of our continuing mission to provide you with exceptional heart care, we have created designated Provider Care Teams.  These Care Teams include your primary Cardiologist (physician) and Advanced Practice Providers (APPs -  Physician Assistants and Nurse Practitioners) who all work together to provide you with the care you need, when you need it. Alan Newcomer, PA-C in 2 weeks  Any Other Special Instructions Will Be Listed Below (If Applicable).  We will arrange a home visit so that your blood pressure can be checked, we can obtain labs, get a rhythm strip and do an exam.  If your symptoms worsen, call 911 and go to the emergency room.

## 2018-09-08 ENCOUNTER — Telehealth: Payer: Self-pay

## 2018-09-08 LAB — BASIC METABOLIC PANEL
BUN/Creatinine Ratio: 21 (ref 10–24)
BUN: 24 mg/dL (ref 8–27)
CO2: 23 mmol/L (ref 20–29)
Calcium: 9.8 mg/dL (ref 8.6–10.2)
Chloride: 102 mmol/L (ref 96–106)
Creatinine, Ser: 1.17 mg/dL (ref 0.76–1.27)
GFR calc Af Amer: 70 mL/min/{1.73_m2} (ref >59)
GFR calc non Af Amer: 60 mL/min/{1.73_m2} (ref >59)
Glucose: 208 mg/dL — ABNORMAL HIGH (ref 65–99)
Potassium: 5 mmol/L (ref 3.5–5.2)
Sodium: 139 mmol/L (ref 134–144)

## 2018-09-08 LAB — CBC
Hematocrit: 39.2 % (ref 37.5–51.0)
Hemoglobin: 13.3 g/dL (ref 13.0–17.7)
MCH: 28.4 pg (ref 26.6–33.0)
MCHC: 33.9 g/dL (ref 31.5–35.7)
MCV: 84 fL (ref 79–97)
Platelets: 209 10*3/uL (ref 150–450)
RBC: 4.69 x10E6/uL (ref 4.14–5.80)
RDW: 14.4 % (ref 11.6–15.4)
WBC: 6.3 10*3/uL (ref 3.4–10.8)

## 2018-09-08 LAB — PRO B NATRIURETIC PEPTIDE: NT-Pro BNP: 1328 pg/mL — ABNORMAL HIGH (ref 0–486)

## 2018-09-08 NOTE — Progress Notes (Signed)
See labs from 09/08/2018  Reviewed rhythm strip - this appears to be normal sinus rhythm. I called the patient at home yesterday and discussed.   Continue current medications and follow up as planned.  [name], PA-C    09/08/2018 11:31 AM

## 2018-09-08 NOTE — Telephone Encounter (Signed)
Notes recorded by Sigurd Sos, RN on 09/08/2018 at 8:44 AM EDT The patient has been notified of the result and verbalized understanding. Scheduled visit with Lorin Picket 5/19. All questions (if any) were answered. Sigurd Sos, RN 09/08/2018 8:44 AM   ------

## 2018-09-08 NOTE — Telephone Encounter (Signed)
-----   Message from Beatrice Lecher, [place] sent at 09/08/2018  7:49 AM EDT ----- Creatinine, K+, Hgb normal.  BNP slightly elevated related to age. Recommendations:  - Continue with increased dose of Lasix as directed during visit on Wed (Lasix 20 mg once daily x 3 days, then resume 10 mg once daily)  - He was supposed to be set up to see me in 1 week.  I wanted to see him sooner than 6/2.  Please arrange earlier follow up. [name], PA-C    09/08/2018 7:47 AM

## 2018-09-12 ENCOUNTER — Other Ambulatory Visit: Payer: Self-pay

## 2018-09-12 ENCOUNTER — Telehealth (INDEPENDENT_AMBULATORY_CARE_PROVIDER_SITE_OTHER): Payer: Medicare Other | Admitting: Physician Assistant

## 2018-09-12 ENCOUNTER — Encounter: Payer: Self-pay | Admitting: Physician Assistant

## 2018-09-12 VITALS — Ht 70.0 in

## 2018-09-12 DIAGNOSIS — I10 Essential (primary) hypertension: Secondary | ICD-10-CM

## 2018-09-12 DIAGNOSIS — I5022 Chronic systolic (congestive) heart failure: Secondary | ICD-10-CM | POA: Diagnosis not present

## 2018-09-12 DIAGNOSIS — I712 Thoracic aortic aneurysm, without rupture, unspecified: Secondary | ICD-10-CM

## 2018-09-12 DIAGNOSIS — R0789 Other chest pain: Secondary | ICD-10-CM

## 2018-09-12 NOTE — Progress Notes (Signed)
Virtual Visit via Telephone Note   This visit type was conducted due to national recommendations for restrictions regarding the COVID-19 Pandemic (e.g. social distancing) in an effort to limit this patient's exposure and mitigate transmission in our community.  Due to his co-morbid illnesses, this patient is at least at moderate risk for complications without adequate follow up.  This format is felt to be most appropriate for this patient at this time.  The patient did not have access to video technology/had technical difficulties with video requiring transitioning to audio format only (telephone).  All issues noted in this document were discussed and addressed.  No physical exam could be performed with this format.  Please refer to the patient's chart for his  consent to telehealth for [facility].   Date:  09/12/2018   ID:  Alan [name], DOB 02/23/1941, MRN [id number]  Patient Location: Home Provider Location: Home  PCP:  [facility]  Cardiologist:  Sherren Mocha, MD / Richardson Dopp, PA-C  Electrophysiologist:  None  TCTS: Dr. Prescott Gum  Evaluation Performed:  Follow-Up Visit  Chief Complaint:  Follow up on CHF, chest pain  History of Present Illness:    Alan [name] is a 78 y.o. male with NICM, ascending thoracic aortic aneurysm, mod AI, diabetes,HTN,prostate CA, thyroid nodule. EF was 35% but has improved to normal by Echo in 4/19.  Cardiac catheterization in 2014 demonstrated no significant CAD.  Myoview in January 2018 demonstrated no ischemia.  CT angiogram in 09/2017 demonstrated ascending aortic aneurysm 4.9 cm. He was admitted UM3/53IRWE a/c systolic HF. I saw him via virtual visit last week for the evaluation of chest pain.  He seemed to be having symptoms either from a MSK etiology or GERD.  I asked him to take a PPI.  He also had some symptoms of volume excess and I adjusted is Lasix.  I set up a home visit.  The BNP was minimally elevated.   A rhythms strip showed NSR.    Today, he notes his breathing is improved and his swelling is resolved.  He still has some chest pain that is mainly brought on by positional changes.  He denies pleuritic pain.  He does feel some radiation to his back.  He does admit to a diet high in salt and does drink a lot of fluids.    The patient does not have symptoms concerning for COVID-19 infection (fever, chills, cough, or new shortness of breath).    Past Medical History:  Diagnosis Date  . Arthritis   . Chronic systolic CHF (congestive heart failure) ([place])   . Diabetes mellitus ORAL MED  . ED (erectile dysfunction)   . Glaucoma of right eye   . History of nuclear stress test    MV 1/18: EF 33, no ischemia; high risk  . History of prostate cancer   . Malfunction of artificial urethral sphincter ([place])   . Malfunction of penile prosthesis ([place])   . NICM (nonischemic cardiomyopathy) ([place]) 02/06/2015   LHC 11/14: EF 35, no significant CAD // Echo 7/16: Diff HK worse in inf base, EF 40%, Gr 1 DD, mild AI, mild MR, mod LAE // Echo 5/18: Moderate concentric LVH, EF 35-40, inferolateral and anterolateral AK, normal wall motion, moderate AI, moderate MR, moderate LAE, mild PI  . Nocturia   . SUI (stress urinary incontinence), male   . Thoracic aortic aneurysm without rupture ([place]) 02/06/2015   Chest CTA 1/18:  5.1 cm ascending aortic aneurysm, mild  cardiomegaly, mild coronary artery calcification, upper lobe small airway disease, cholelithiasis, 16 mm right thyroid nodule   Past Surgical History:  Procedure Laterality Date  . I & D/ ORIF RIGHT INDEX FINGER  01-19-2005  . INSERTION INFLATABLE PENILE PROSTHESIS  06-22-1999   MENTOR ALPHA I  . IR GENERIC HISTORICAL  06/17/2016   IR RADIOLOGIST EVAL & MGMT 06/17/2016 Marybelle Killings, MD [facility] INTERV RAD  . LEFT HEART CATHETERIZATION WITH CORONARY ANGIOGRAM N/A 03/15/2013   Procedure: LEFT HEART CATHETERIZATION WITH CORONARY ANGIOGRAM;  Surgeon: Blane Ohara, MD;  Location: [facility] CATH LAB;  Service: Cardiovascular;  Laterality: N/A;  . PENILE PROSTHESIS IMPLANT  09/24/2011   Procedure: PENILE PROTHESIS INFLATABLE;  Surgeon: Claybon Jabs, MD;  Location[facility];  Service: Urology;  Laterality: N/A;  REPLACEMENT OF INFLATABLE PENILE PROTHESIS ([place])    . PLACEMENT ARTIFICIAL URINARY SPHINTER  01-20-2001   [id number]  . REMOVAL AND REPLACEMENT GU SPHINTER CUFF  05-15-2004   STRESS URINARY INCONTINENCE     Current Meds  Medication Sig  . aspirin 81 MG tablet Take 81 mg by mouth daily.  Marland Kitchen b complex vitamins tablet Take 1 tablet by mouth daily.  . brimonidine (ALPHAGAN) 0.2 % ophthalmic solution Place 2 drops into both eyes daily.  . busPIRone (BUSPAR) 10 MG tablet Take 10 mg by mouth at bedtime as needed.   . Cholecalciferol (VITAMIN D) 2000 UNITS CAPS Take 2,000 Units by mouth daily.   . dorzolamide-timolol (COSOPT) 22.3-6.8 MG/ML ophthalmic solution Place 1 drop into both eyes 2 (two) times daily.   . furosemide (LASIX) 20 MG tablet Take 0.5 tablets (10 mg total) by mouth daily.  Marland Kitchen glipiZIDE (GLUCOTROL) 10 MG tablet Take 10 mg by mouth 2 (two) times daily before a meal.  . latanoprost (XALATAN) 0.005 % ophthalmic solution Place 1 drop into both eyes at bedtime.   Marland Kitchen losartan (COZAAR) 25 MG tablet Take 25 mg by mouth daily.  . meclizine (ANTIVERT) 25 MG tablet Take 25 mg by mouth as needed.   . metFORMIN (GLUCOPHAGE) 500 MG tablet Take 1039m in the am and 5012min the evening by mouth daily  . metoprolol succinate (TOPROL-XL) 50 MG 24 hr tablet Take 1 tablet (50 mg total) by mouth daily.  . Marland Kitchenpironolactone (ALDACTONE) 25 MG tablet Take 1 tablet (25 mg total) by mouth daily.     Allergies:   Statins   Social History   Tobacco Use  . Smoking status: Never Smoker  . Smokeless tobacco: Never Used  Substance Use Topics  . Alcohol use: No  . Drug use: No     Family Hx: The patient's family history includes Healthy  in his sister and sister; Heart Problems in his mother; Heart attack in his father.  ROS:   Please see the history of present illness.     All other systems reviewed and are negative.   Prior CV studies:   The following studies were reviewed today:   Chest CTA 10/12/2017 IMPRESSION: Grossly stable 4.9 cm ascending thoracic aortic aneurysm. Recommend semi-annual imaging followup by CTA or MRA and referral to cardiothoracic surgery if not already obtained. This recommendation follows 2010 ACCF/AHA/AATS/ACR/ASA/SCA/SCAI/SIR/STS/SVM Guidelines for the Diagnosis and Management of Patients With Thoracic Aortic Disease. Circulation. 2010; 121: : M034-J179 Stable 16 mm right thyroid nodule. Further evaluation with thyroid ultrasound is recommended if not already performed.  Cholelithiasis.   Echo 08/15/2017 Moderate LVH, EF 50-55, global HK, grade 1 diastolic dysfunction, mild AI,  dilated ascending aorta (4.3 cm), MAC, mild MR, mild LAE  Echo 09/09/16 Moderate concentric LVH, EF 35-40,inferolateral and anterolateral AK, normal wall motion, moderate AI, moderate MR, moderate LAE, mild PI  Myoview 05/11/16 No ischemia, EF 33, high risk  Echo 7/16 Diff HK worse in inf base, EF 40%, Gr 1 DD, mild AI, mild MR, mod LAE  LHC 11/14 LM: minor irregularity. LAD: diffuse irregularities but no significant stenoses throughout the course of the LAD LCx: widely patent without obstructive disease. RCA: widely patent.  Left ventriculography: inferolateral wall is akinetic. The anterolateral wall and apex appear hypokinetic. The basal inferior wall is hypokinetic. EF 35%.  Labs/Other Tests and Data Reviewed:    EKG:  No ECG reviewed.  Recent Labs: 09/07/2018: BUN 24; Creatinine, Ser 1.17; Hemoglobin 13.3; NT-Pro BNP 1,328; Platelets 209; Potassium 5.0; Sodium 139   Recent Lipid Panel Lab Results  Component Value Date/Time   CHOL 166 09/09/2016 04:06 AM   TRIG 157 (H)  09/09/2016 04:06 AM   HDL 35 (L) 09/09/2016 04:06 AM   CHOLHDL 4.7 09/09/2016 04:06 AM   LDLCALC 100 (H) 09/09/2016 04:06 AM    Wt Readings from Last 3 Encounters:  09/06/18 240 lb (108.9 kg)  04/04/18 234 lb 6.4 oz (106.3 kg)  10/12/17 232 lb (105.2 kg)     Objective:    Vital Signs:  Ht _0  (1.778 m)   BMI 34.44 kg/m    VITAL SIGNS:  reviewed GEN:  no acute distress RESPIRATORY:  no labored breathing NEURO:  alert and oriented PSYCH:  he seems to be in good spirits  ASSESSMENT & PLAN:     Chronic systolic CHF (congestive heart failure) (HCC) EF has improved to 50-55 by most recent echo with evidence of mild diastolic dysfunction.  His weight is down and he feels his breathing is better.  Continue current dose of Lasix.  He knows when to take extra lasix.  Continue angiotensin receptor blocker, beta-blocker, spironolactone.   Other chest pain His pain sounds MSK.  He does have some radiation to his back.  As he is due for his follow up CTA, I will go ahead and arrange this now.  Otherwise, he can use heat/ice and try to limit his activity for now.  Essential hypertension He cannot check his BP but his BP with the home visit last week was optimal.  Thoracic aortic aneurysm without rupture (HCC)  4.9 cm by CTA last year.  As noted, I will go ahead and get a follow up scan now.  If his aneurysm is stable, follow up with Dr. Prescott Gum as planned in July.   Time:   Today, I have spent 11.25 minutes with the patient with telehealth technology discussing the above problems.     Medication Adjustments/Labs and Tests Ordered: Current medicines are reviewed at length with the patient today.  Concerns regarding medicines are outlined above.   Tests Ordered: Orders Placed This Encounter  Procedures  . CT ANGIO CHEST AORTA W/CM &/OR WO/CM    Medication Changes: No orders of the defined types were placed in this encounter.   Disposition:  Follow up in 3 month(s)   Signed, Richardson Dopp, PA-C  09/12/2018 4:56 PM    [facility]

## 2018-09-12 NOTE — Patient Instructions (Signed)
Medication Instructions:  Continue your current medications.  If you need a refill on your cardiac medications before your next appointment, please call your pharmacy.   Lab work: None   If you have labs (blood work) drawn today and your tests are completely normal, you will receive your results only by: Marland Kitchen MyChart Message (if you have MyChart) OR . A paper copy in the mail If you have any lab test that is abnormal or we need to change your treatment, we will call you to review the results.  Testing/Procedures: We will arrange a Chest CT Angiogram to recheck your aneurysm.    Follow-Up: At [facility], you and your health needs are our priority.  As part of our continuing mission to provide you with exceptional heart care, we have created designated Provider Care Teams.  These Care Teams include your primary Cardiologist (physician) and Advanced Practice Providers (APPs -  Physician Assistants and Nurse Practitioners) who all work together to provide you with the care you need, when you need it. You will need a follow up appointment in:  3 months.  Please call our office 2 months in advance to schedule this appointment.  You may see Alan Bollman, MD or Alan Newcomer, PA-C   Any Other Special Instructions Will Be Listed Below (If Applicable).  Weigh daily. If your weight increases 3 lbs or more in 1 day or you notice your legs are more swollen, you can take a whole Lasix (Furosemide) tablet that day.  Otherwise, you can remain on the 1/2 tablet daily.  Limit your fluids to about 60 ounces a day.  That should keep you hydrated and prevent you from gaining too much fluid.  Try to limit your salt to 2 grams (2000 mg) per day.

## 2018-09-19 ENCOUNTER — Telehealth: Payer: Self-pay

## 2018-09-19 NOTE — Telephone Encounter (Signed)
-----   Message from [name], MD sent at 09/19/2018  8:00 AM EDT ----- Orpha Bur - can you get him set up for a virtual visit with me for ER follow-up? He was seen at [facility], had minimally elevated trop. Thx!

## 2018-09-19 NOTE — Telephone Encounter (Signed)
Per Dr. [name], scheduled patient for e-visit 5/29. Consent obtained for phone call as he only has a flip phone.     Virtual Visit Pre-Appointment Phone Call Confirm consent - "In the setting of the current Covid19 crisis, you are scheduled for a (phone or video) visit with your provider on (date) at (time).  Just as we do with many in-office visits, in order for you to participate in this visit, we must obtain consent.  If you'd like, I can send this to your mychart (if signed up) or email for you to review.  Otherwise, I can obtain your verbal consent now.  All virtual visits are billed to your insurance company just like a normal visit would be.  By agreeing to a virtual visit, we'd like you to understand that the technology does not allow for your provider to perform an examination, and thus may limit your provider's ability to fully assess your condition. If your provider identifies any concerns that need to be evaluated in person, we will make arrangements to do so.  Finally, though the technology is pretty good, we cannot assure that it will always work on either your or our end, and in the setting of a video visit, we may have to convert it to a phone-only visit.  In either situation, we cannot ensure that we have a secure connection.  Are you willing to proceed?" STAFF: Did the patient verbally acknowledge consent to telehealth visit? Document YES/NO here: YES  TELEPHONE CALL NOTE  Alan [name] has been deemed a candidate for a follow-up tele-health visit to limit community exposure during the Covid-19 pandemic. I spoke with the patient via phone to ensure availability of phone/video source, confirm preferred email & phone number, and discuss instructions and expectations.  I reminded Alan [name] to be prepared with any vital sign and/or heart rhythm information that could potentially be obtained via home monitoring, at the time of his visit. I reminded Alan [name] to  expect a phone call prior to his visit.

## 2018-09-22 ENCOUNTER — Encounter: Payer: Self-pay | Admitting: Cardiovascular Disease

## 2018-09-22 ENCOUNTER — Other Ambulatory Visit: Payer: Self-pay

## 2018-09-22 ENCOUNTER — Telehealth (INDEPENDENT_AMBULATORY_CARE_PROVIDER_SITE_OTHER): Payer: Medicare Other | Admitting: Cardiovascular Disease

## 2018-09-22 VITALS — Ht 70.0 in | Wt 236.0 lb

## 2018-09-22 DIAGNOSIS — I5033 Acute on chronic diastolic (congestive) heart failure: Secondary | ICD-10-CM | POA: Diagnosis not present

## 2018-09-22 DIAGNOSIS — R0789 Other chest pain: Secondary | ICD-10-CM

## 2018-09-22 DIAGNOSIS — I10 Essential (primary) hypertension: Secondary | ICD-10-CM

## 2018-09-22 DIAGNOSIS — I712 Thoracic aortic aneurysm, without rupture, unspecified: Secondary | ICD-10-CM

## 2018-09-22 MED ORDER — FUROSEMIDE 20 MG PO TABS: 20.0000 mg | ORAL_TABLET | Freq: Every day | ORAL | 3 refills | Status: DC

## 2018-09-22 NOTE — Patient Instructions (Addendum)
Medication Instructions:  1) INCREASE LASIX (furosemide) to 20 mg daily  Testing/Procedures: Your provider has requested that you have an echocardiogram. Echocardiography is a painless test that uses sound waves to create images of your heart. It provides your doctor with information about the size and shape of your heart and how well your heart's chambers and valves are working. This procedure takes approximately one hour. There are no restrictions for this procedure. You are scheduled for your echo on 01/10/2019. Please arrive to our office at 2:30PM.  Your CT scheduled in late June has been cancelled.   Labwork: You will labs drawn when you return for your echo on 01/10/2019. You do not need to be fasting.  Follow-Up: Please keep your appointment with Dr. Maren Beach.   You have an appointment scheduled with Dr. [name] on 01/12/2019 at 4:20PM.

## 2018-09-22 NOTE — Addendum Note (Signed)
Addended by: Gunnar Fusi A on: 09/22/2018 02:45 PM   Modules accepted: Orders

## 2018-09-22 NOTE — Progress Notes (Signed)
Virtual Visit via Telephone Note   This visit type was conducted due to national recommendations for restrictions regarding the COVID-19 Pandemic (e.g. social distancing) in an effort to limit this patient's exposure and mitigate transmission in our community.  Due to his co-morbid illnesses, this patient is at least at moderate risk for complications without adequate follow up.  This format is felt to be most appropriate for this patient at this time.  The patient did not have access to video technology/had technical difficulties with video requiring transitioning to audio format only (telephone).  All issues noted in this document were discussed and addressed.  No physical exam could be performed with this format.  Please refer to the patient's chart for his  consent to telehealth for [facility].   Date:  09/22/2018   ID:  Alan [name], DOB 09/23/1940, MRN [id number]  Patient Location: Home Provider Location: Home  [facility]  Cardiologist:  Tonny BollmanMichael Lorik Guo, MD  Electrophysiologist:  None   Evaluation Performed:  Follow-Up Visit  Chief Complaint:  Shortness of breath  History of Present Illness:    Alan [name] is a 78 y.o. male with thoracic aortic aneurysm under surveillance, nonobstructive CAD, and chronic diastolic heart failure, presenting for follow-up evaluation.  The patient's aneurysm has been followed by serial CTA studies.  He is followed by Dr. Maren BeachVantrigt.  He is on chronic diuretic therapy for fluid management and has tolerated this fairly well.  He admits to being too liberal with sodium over the last few months.  [facility] recently with chest pain.  This evaluation is reviewed and he underwent lab studies, and EKG, and a CT angiogram of the chest.  The CT angiogram demonstrated stability of his ascending thoracic aneurysm at 4.8 x 4.6 cm in maximal diameter.  The patient was noted to have minimal troponin  elevation, he opted to leave the emergency room without further evaluation.  He has had no change in his symptoms.  He has had an atypical near constant chest discomfort that is completely unrelated to physical exertion.  He is undergoing GI evaluation for hiatal hernia.  He is also complained of orthopnea and PND.  He has not had significant leg swelling of late.  The patient does not have symptoms concerning for COVID-19 infection (fever, chills, cough, or new shortness of breath).    Past Medical History:  Diagnosis Date   Arthritis    Chronic systolic CHF (congestive heart failure) (HCC)    Diabetes mellitus ORAL MED   ED (erectile dysfunction)    Glaucoma of right eye    History of nuclear stress test    MV 1/18: EF 33, no ischemia; high risk   History of prostate cancer    Malfunction of artificial urethral sphincter (HCC)    Malfunction of penile prosthesis (HCC)    NICM (nonischemic cardiomyopathy) (HCC) 02/06/2015   LHC 11/14: EF 35, no significant CAD // Echo 7/16: Diff HK worse in inf base, EF 40%, Gr 1 DD, mild AI, mild MR, mod LAE // Echo 5/18: Moderate concentric LVH, EF 35-40, inferolateral and anterolateral AK, normal wall motion, moderate AI, moderate MR, moderate LAE, mild PI   Nocturia    SUI (stress urinary incontinence), male    Thoracic aortic aneurysm without rupture (HCC) 02/06/2015   Chest CTA 1/18:  5.1 cm ascending aortic aneurysm, mild cardiomegaly, mild coronary artery calcification, upper lobe small airway disease, cholelithiasis, 16 mm  right thyroid nodule   Past Surgical History:  Procedure Laterality Date   I & D/ ORIF RIGHT INDEX FINGER  01-19-2005   INSERTION INFLATABLE PENILE PROSTHESIS  06-22-1999   MENTOR ALPHA I   IR GENERIC HISTORICAL  06/17/2016   IR RADIOLOGIST EVAL & MGMT 06/17/2016 Jolaine Click, MD [facility] INTERV RAD   LEFT HEART CATHETERIZATION WITH CORONARY ANGIOGRAM N/A 03/15/2013   Procedure: LEFT HEART CATHETERIZATION WITH  CORONARY ANGIOGRAM;  Surgeon: [name], MD;  Location: [facility] CATH LAB;  Service: Cardiovascular;  Laterality: N/A;   PENILE PROSTHESIS IMPLANT  09/24/2011   Procedure: PENILE PROTHESIS INFLATABLE;  Surgeon: [name], MD;  Location: [facility];  Service: Urology;  Laterality: N/A;  REPLACEMENT OF INFLATABLE PENILE PROTHESIS (COLOPLAST)     PLACEMENT ARTIFICIAL URINARY SPHINTER  01-20-2001   [id number]   REMOVAL AND REPLACEMENT GU SPHINTER CUFF  05-15-2004   STRESS URINARY INCONTINENCE     Current Meds  Medication Sig   aspirin 81 MG tablet Take 81 mg by mouth daily.   b complex vitamins tablet Take 1 tablet by mouth daily.   brimonidine (ALPHAGAN) 0.2 % ophthalmic solution Place 2 drops into both eyes daily.   busPIRone (BUSPAR) 10 MG tablet Take 10 mg by mouth at bedtime as needed.    Cholecalciferol (VITAMIN D) 2000 UNITS CAPS Take 2,000 Units by mouth daily.    dorzolamide-timolol (COSOPT) 22.3-6.8 MG/ML ophthalmic solution Place 1 drop into both eyes 2 (two) times daily.    furosemide (LASIX) 20 MG tablet Take 0.5 tablets (10 mg total) by mouth daily.   glipiZIDE (GLUCOTROL) 10 MG tablet Take 10 mg by mouth 2 (two) times daily before a meal.   latanoprost (XALATAN) 0.005 % ophthalmic solution Place 1 drop into both eyes at bedtime.    losartan (COZAAR) 25 MG tablet Take 25 mg by mouth daily.   meclizine (ANTIVERT) 25 MG tablet Take 25 mg by mouth as needed.    metFORMIN (GLUCOPHAGE) 500 MG tablet Take  in the am and  in the evening by mouth daily   metoprolol succinate (TOPROL-XL) 50 MG 24 hr tablet Take 1 tablet (50 mg total) by mouth daily.   omeprazole (PRILOSEC) 40 MG capsule Take 1 capsule by mouth daily.   spironolactone (ALDACTONE) 25 MG tablet Take 1 tablet (25 mg total) by mouth daily.     Allergies:   Statins   Social History   Tobacco Use   Smoking status: Never Smoker   Smokeless tobacco: Never Used  Substance  Use Topics   Alcohol use: No   Drug use: No     Family Hx: The patient's family history includes Healthy in his sister and sister; Heart Problems in his mother; Heart attack in his father.  ROS:   Please see the history of present illness.    All other systems reviewed and are negative.   Prior CV studies:   The following studies were reviewed today:  Echocardiogram 08/15/2017: Study Conclusions  - Left ventricle: The cavity size was normal. Wall thickness was   increased in a pattern of moderate LVH. Systolic function was   normal. The estimated ejection fraction was in the range of 50%   to 55%. Global hypokinesis. Doppler parameters are consistent   with abnormal left ventricular relaxation (grade 1 diastolic   dysfunction). The E/e&' ratio is >15, suggesting elevated LV   filling pressure. - Aortic valve: Poorly visualized. There was mild regurgitation. - Ascending aorta:  The ascending aorta is dilated to 4.3 cm. - Mitral valve: Calcified annulus. Mildly thickened leaflets .   There was mild regurgitation. - Left atrium: The atrium was mildly dilated.  Impressions:  - Compared to a prior study in 08/2016, the LVEF has improved to   50-55%. The ascending aorta is dilated to 4.3 cm.  CT angiogram 09/15/2018: Vascular:  Atherosclerotic changes are visualized in the thoracic aorta and abdominal aorta. There is mild aneurysmal dilatation of the ascending thoracic aorta measuring 4.8 x 4.6 cm. This has not changed significantly. No dissection is visualized. There is no  evidence of aneurysmal dilatation descending thoracic aorta abdominal aorta or iliac vessels. Atherosclerotic changes are noted in the iliac arteries without high-grade stenosis or occlusion.  Labs/Other Tests and Data Reviewed:    EKG:  An ECG dated 04/04/2018 was personally reviewed today and demonstrated:  Sinus rhythm with right bundle branch block, left axis deviation  Recent Labs: 09/07/2018: BUN  24; Creatinine, Ser 1.17; Hemoglobin 13.3; NT-Pro BNP 1,328; Platelets 209; Potassium 5.0; Sodium 139   Recent Lipid Panel Lab Results  Component Value Date/Time   CHOL 166 09/09/2016 04:06 AM   TRIG 157 (H) 09/09/2016 04:06 AM   HDL 35 (L) 09/09/2016 04:06 AM   CHOLHDL 4.7 09/09/2016 04:06 AM   LDLCALC 100 (H) 09/09/2016 04:06 AM    Wt Readings from Last 3 Encounters:  09/22/18 236 lb (107 kg)  09/06/18 240 lb (108.9 kg)  04/04/18 234 lb 6.4 oz (106.3 kg)     Objective:    Vital Signs:  Ht 5\' 10"  (1.778 m)    Wt 236 lb (107 kg)    BMI 33.86 kg/m    VITAL SIGNS:  reviewed The patient is alert, oriented, in no distress.  He is breathing comfortably with conversation.  Remaining exam deferred as this is a telephone/virtual visit  ASSESSMENT & PLAN:    1. Acute on chronic diastolic heart failure: I have personally reviewed the patient's last echo study.  His symptoms are fairly typical of fluid overload with orthopnea and PND.  I recommended that he continue Spironolactone 25 mg daily and increase furosemide to 20 mg daily.  We had a lengthy discussion about the importance of sodium restriction and he will do his best to improve on this.  He has been eating a lot of salt during the pandemic but has recently cut way back.  He is starting to feel better.  When he returns in 3 months I would like him to have an echocardiogram prior to his visit. 2. Ascending thoracic aortic aneurysm: Stable by recent CTA study done in the [facility] system.  The study as outlined above with maximal diameter 4.8 x 4.6 cm.  Will cancel his CTA study scheduled next month.  The patient will keep his follow-up with Dr. Maren Beach. 3. Hypertension: Blood pressure well controlled, continue sodium restriction and current medical therapy. 4. Chest pain: Highly atypical.  Has been noted to have minimally elevated troponin over time.  He underwent a heart catheterization in 2014 and the study is reviewed today.  It  demonstrated minimal nonobstructive CAD.  He has no exertional component to his chest discomfort.  He is undergoing GI evaluation and is suspected of having a hiatal hernia.  Continue observation at this point.  For follow-up I would like to see Mr. Kocik back in 3 months with an echocardiogram, metabolic panel, and BNP prior to the visit.  COVID-19 Education: The signs and symptoms of  COVID-19 were discussed with the patient and how to seek care for testing (follow up with PCP or arrange E-visit). The importance of social distancing was discussed today.  Time:   Today, I have spent 25 minutes with the patient with telehealth technology discussing the above problems.     Medication Adjustments/Labs and Tests Ordered: Current medicines are reviewed at length with the patient today.  Concerns regarding medicines are outlined above.   Tests Ordered: No orders of the defined types were placed in this encounter.   Medication Changes: No orders of the defined types were placed in this encounter.   Disposition:  Follow up in 3 month(s)  Signed, [name], MD  09/22/2018 1:40 PM    [facility]

## 2018-09-25 ENCOUNTER — Ambulatory Visit: Payer: Medicare Other | Admitting: Physician Assistant

## 2018-09-26 ENCOUNTER — Ambulatory Visit: Payer: Medicare Other | Admitting: Physician Assistant

## 2018-10-06 ENCOUNTER — Telehealth: Payer: Self-pay | Admitting: Cardiology

## 2018-10-06 NOTE — Telephone Encounter (Signed)
Ginger, his care taker called stating patient does not want to go to ER, he would like a callback.

## 2018-10-06 NOTE — Telephone Encounter (Signed)
I tried to call the patient.  There was no answer. He was just seen by Dr. Burt Knack a few weeks ago. Recent trip to ED at [facility].  He has a hx of chronically, minimally elevated Troponin levels.  CT demonstrated stable TAA. He is being worked up by GI for ?hiatal hernia and Ba swallow is pending. PLAN:  Will send to Triage - Please call regarding his symptoms. Please see if he can be added on to [facility] APP schedule.  I have no openings. Richardson Dopp, PA-C    10/06/2018 8:44 AM

## 2018-10-06 NOTE — Telephone Encounter (Signed)
Agree he needs to go to the ED. He has had issues with CHF in the past and assoc CP.  If he decides to not go to the ED, he can take an extra dose of Lasix 20 mg today and extra 20 mg tomorrow. His CT next week can be canceled.  This was ordered from before to follow up on his aneurysm.  But, he had at CT done at [facility] on 09/15/2018.  So, it does not need to be repeated.  Reviewed notes from Dr Burt Knack 5/29 >> he wanted it canceled as well. Richardson Dopp, PA-C    10/06/2018 1:28 PM

## 2018-10-06 NOTE — Telephone Encounter (Signed)
Left message for patient to call back  

## 2018-10-06 NOTE — Telephone Encounter (Signed)
Pt with recurrent chest pain.  Prefers to avoid emergency room.  Will send to Richardson Dopp PA who knows the pt and see if he can eval.  Today in office.

## 2018-10-06 NOTE — Telephone Encounter (Signed)
Spoke with Ginger caretaker while patient talked in background  been up all night and having  Shortness of breath chest pain and back pain and gave 3 aspirin 81 mg  7:30 am. Patients is still experiencing pain

## 2018-10-06 NOTE — Telephone Encounter (Signed)
DOD, Dr. Radford Pax, advised patient to go to the ED. Called patient again with advisement. Patient verbalized understanding, but still refuses to go to the ED. Informed patient that this is the only advise we have for him at this time.

## 2018-10-06 NOTE — Telephone Encounter (Signed)
Called patient back with Alan Dopp PA's advisement. Patient stated he would take an extra lasix today and tomorrow. Canceled patient's CT for Monday and advised him to keep his up coming appointment with Dr. Prescott Gum. Patient verbalized understanding.

## 2018-10-06 NOTE — Telephone Encounter (Signed)
Patient is refusing to go to ED. Patient stated his chest pain eased off after taking 3 baby aspirin. Patient stated now he has back pain and feels a little disorientated. Tried to reassure patient that the ED is safe to go to that they are using precautions with the [facility]. Patient stated he will not go to the ED.  Spoke with Richardson Dopp PA, who will review his chart and advise.

## 2018-10-06 NOTE — Telephone Encounter (Signed)
If he is having ongoing/continuous chest pain, he needs to go to the ED. Richardson Dopp, PA-C    10/06/2018 10:42 AM

## 2018-10-09 ENCOUNTER — Inpatient Hospital Stay: Admission: RE | Admit: 2018-10-09 | Payer: Medicare Other | Source: Ambulatory Visit

## 2018-10-18 ENCOUNTER — Other Ambulatory Visit: Payer: Medicare Other

## 2018-10-18 ENCOUNTER — Ambulatory Visit: Payer: Medicare Other | Admitting: Cardiothoracic Surgery

## 2018-10-24 ENCOUNTER — Other Ambulatory Visit: Payer: Self-pay

## 2018-10-25 ENCOUNTER — Telehealth: Payer: Self-pay | Admitting: Physician Assistant

## 2018-10-25 ENCOUNTER — Ambulatory Visit: Payer: Medicare Other | Admitting: Cardiothoracic Surgery

## 2018-10-25 NOTE — Telephone Encounter (Signed)
Spoke with the patient, he had a visit with Dr. Prescott Gum. However, his appointment was cancelled since he did not have a recent CTA. In [place] he had a CTA on 09/15/18 at [facility].

## 2018-10-25 NOTE — Telephone Encounter (Signed)
F/U Message           Patient is up set because Dr. Arville Lime office would not see him because Richardson Dopp did not provide them with a some type of heart scan. Patient is very up set if someone could call him to see what he needs he will greatly appreciate it.

## 2018-10-25 NOTE — Telephone Encounter (Signed)
Patient wants a call back

## 2018-10-25 NOTE — Telephone Encounter (Signed)
The patient had his CT at [facility] when he went to the ED in May.  Please let Dr. Lucianne Lei Trigt's office know. Richardson Dopp, PA-C    10/25/2018 5:29 PM

## 2018-11-01 ENCOUNTER — Encounter: Payer: Self-pay | Admitting: Emergency Medicine

## 2018-11-29 ENCOUNTER — Other Ambulatory Visit: Payer: Self-pay

## 2018-11-29 ENCOUNTER — Encounter: Payer: Self-pay | Admitting: Cardiothoracic Surgery

## 2018-11-29 ENCOUNTER — Ambulatory Visit (INDEPENDENT_AMBULATORY_CARE_PROVIDER_SITE_OTHER): Payer: Medicare Other | Admitting: Cardiothoracic Surgery

## 2018-11-29 VITALS — BP 109/61 | HR 71 | Temp 97.3°F | Resp 18 | Ht 70.0 in | Wt 232.0 lb

## 2018-11-29 DIAGNOSIS — I712 Thoracic aortic aneurysm, without rupture, unspecified: Secondary | ICD-10-CM

## 2018-11-29 NOTE — Progress Notes (Signed)
PCP is [facility] Referring Provider is Louellen Molder, MD  Chief Complaint  Patient presents with  . 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  Patient has well-controlled hypertension.  The patient is a non-smoker.  Patient's recent echo shows mild AI with a trileaflet aortic valve, EF 50-55%.  Patient denies chest pain presyncope or shortness of breath.   Past Medical History:  Diagnosis Date  . Arthritis   . Chronic systolic CHF (congestive heart failure) ([place])   . Diabetes mellitus [facility]  . ED (erectile dysfunction)   . Glaucoma of right eye   . History of nuclear stress test    MV 1/18: EF 33, no ischemia; high risk  . History of prostate cancer   . Malfunction of artificial urethral sphincter ([place])   . Malfunction of penile prosthesis ([place])   . NICM (nonischemic cardiomyopathy) ([place]) 02/06/2015   LHC 11/14: EF 35, no significant CAD // Echo 7/16: Diff HK worse in inf base, EF 40%, Gr 1 DD, mild AI, mild MR, mod LAE // Echo 5/18: Moderate concentric LVH, EF 35-40, inferolateral and anterolateral AK, normal wall motion, moderate AI, moderate MR, moderate LAE, mild PI  . Nocturia   . SUI (stress urinary incontinence), male   . Thoracic aortic aneurysm without rupture ([place]) 02/06/2015   Chest CTA 1/18:  5.1 cm ascending aortic aneurysm, mild cardiomegaly, mild coronary artery calcification, upper lobe small airway disease, cholelithiasis, 16 mm right thyroid nodule    Past Surgical History:  Procedure Laterality Date  . I & D/ ORIF RIGHT INDEX FINGER  01-19-2005  . INSERTION INFLATABLE PENILE PROSTHESIS  06-22-1999   MENTOR ALPHA I  . IR GENERIC HISTORICAL  06/17/2016   IR RADIOLOGIST EVAL & MGMT 06/17/2016 Marybelle Killings, MD [facility] INTERV RAD  . LEFT HEART CATHETERIZATION WITH CORONARY ANGIOGRAM N/A 03/15/2013   Procedure: LEFT  HEART CATHETERIZATION WITH CORONARY ANGIOGRAM;  Surgeon: Blane Ohara, MD;  Location: [facility] CATH LAB;  Service: Cardiovascular;  Laterality: N/A;  . PENILE PROSTHESIS IMPLANT  09/24/2011   Procedure: PENILE PROTHESIS INFLATABLE;  Surgeon: Claybon Jabs, MD;  Location: [facility];  Service: Urology;  Laterality: N/A;  REPLACEMENT OF INFLATABLE PENILE PROTHESIS ([place])    . PLACEMENT ARTIFICIAL URINARY SPHINTER  01-20-2001   [id number]  . REMOVAL AND REPLACEMENT GU SPHINTER CUFF  05-15-2004   STRESS URINARY INCONTINENCE    Family History  Problem Relation Age of Onset  . Heart Problems Mother   . Heart attack Father   . Healthy Sister        NO HEART ISSUES  . Healthy Sister        NO HEART ISSUES    Social History Social History   Tobacco Use  . Smoking status: Never Smoker  . Smokeless tobacco: Never Used  Substance Use Topics  . Alcohol use: No  . Drug use: No    Current Outpatient Medications  Medication Sig Dispense Refill  . aspirin 81 MG tablet Take 81 mg by mouth daily.    Marland Kitchen b complex vitamins tablet Take 1 tablet by mouth daily.    . brimonidine (ALPHAGAN) 0.2 % ophthalmic solution Place 2 drops into both eyes daily.    . busPIRone (BUSPAR) 10 MG tablet Take 10 mg by mouth at bedtime as needed.     Marland Kitchen  Cholecalciferol (VITAMIN D) 2000 UNITS CAPS Take 2,000 Units by mouth daily.     . dorzolamide-timolol (COSOPT) 22.3-6.8 MG/ML ophthalmic solution Place 1 drop into both eyes 2 (two) times daily.     . furosemide (LASIX) 20 MG tablet Take 1 tablet (20 mg total) by mouth daily. 90 tablet 3  . glipiZIDE (GLUCOTROL) 10 MG tablet Take 10 mg by mouth 2 (two) times daily before a meal.    . latanoprost (XALATAN) 0.005 % ophthalmic solution Place 1 drop into both eyes at bedtime.     Marland Kitchen losartan (COZAAR) 25 MG tablet Take 25 mg by mouth daily.    . meclizine (ANTIVERT) 25 MG tablet Take 25 mg by mouth as needed.     . metFORMIN (GLUCOPHAGE) 500 MG tablet Take  1000mg  in the am and 500mg  in the evening by mouth daily    . metoprolol succinate (TOPROL-XL) 50 MG 24 hr tablet Take 1 tablet (50 mg total) by mouth daily. 30 tablet 0  . omeprazole (PRILOSEC) 40 MG capsule Take 1 capsule by mouth daily.    Marland Kitchen spironolactone (ALDACTONE) 25 MG tablet Take 1 tablet (25 mg total) by mouth daily. 30 tablet 0   No current facility-administered medications for this visit.     Allergies  Allergen Reactions  . Statins Other (See Comments)    Other reaction(s): Other Myalgias.Marland KitchenMarland KitchenALL STATINS Myalgias.Marland KitchenMarland KitchenALL STATINS  Myalgias.Marland KitchenMarland KitchenALL STATINS    Review of Systems   Patient has had previous successful surgery for prostate cancer. He is retired. He tries to avoid heavy lifting. He is not taking blood thinners. He denies palpitations or A. Fib. He does not check his blood pressure at home but he is encouraged to do so.   BP 109/61 (BP Location: Left Arm, Patient Position: Sitting, Cuff Size: Normal)   Pulse 71   Temp (!) 97.3 F (36.3 C)   Resp 18   Ht 5\' 10"  (1.778 m)   Wt 232 lb (105.2 kg)   SpO2 93% Comment: RA  BMI 33.29 kg/m  Physical Exam      Exam    General- alert and comfortable    Neck- no JVD, no cervical adenopathy palpable, no carotid bruit   Lungs- clear without rales, wheezes   Cor- regular rate and rhythm, no murmur , gallop   Abdomen- soft, non-tender   Extremities - warm, non-tender, minimal edema   Neuro- oriented, appropriate, no focal weakness   Diagnostic Tests: CTA images performed at outside facility reviewed on disc provided by the patient. The ascending aorta remains at 4.8 cm.  There is no mural thickening hematoma or ulceration.  Patient understands that his abdominal scan also showed the presence of gallstones.  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  Current risk for dissection is less than 1%. Well-controlled hypertension History of prostate cancer History of nonischemic  cardiomyopathy now improved  Plan: Patient return with surveillance CTA in 1 year.  He will try to measure his blood pressure once a week and keep records for his primary care physician.  The goal is to keep systolic pressure that less than 150.  Mikey Bussing, MD [facility] [phone]

## 2019-01-10 ENCOUNTER — Other Ambulatory Visit: Payer: Medicare Other | Admitting: *Deleted

## 2019-01-10 ENCOUNTER — Other Ambulatory Visit: Payer: Self-pay

## 2019-01-10 ENCOUNTER — Ambulatory Visit (HOSPITAL_COMMUNITY): Payer: Medicare Other | Attending: Cardiovascular Disease

## 2019-01-10 DIAGNOSIS — I5033 Acute on chronic diastolic (congestive) heart failure: Secondary | ICD-10-CM

## 2019-01-11 LAB — BASIC METABOLIC PANEL
BUN/Creatinine Ratio: 20 (ref 10–24)
BUN: 26 mg/dL (ref 8–27)
CO2: 23 mmol/L (ref 20–29)
Calcium: 9.8 mg/dL (ref 8.6–10.2)
Chloride: 103 mmol/L (ref 96–106)
Creatinine, Ser: 1.29 mg/dL — ABNORMAL HIGH (ref 0.76–1.27)
GFR calc Af Amer: 62 mL/min/{1.73_m2} (ref >59)
GFR calc non Af Amer: 53 mL/min/{1.73_m2} — ABNORMAL LOW (ref >59)
Glucose: 232 mg/dL — ABNORMAL HIGH (ref 65–99)
Potassium: 5.2 mmol/L (ref 3.5–5.2)
Sodium: 141 mmol/L (ref 134–144)

## 2019-01-11 LAB — PRO B NATRIURETIC PEPTIDE: NT-Pro BNP: 760 pg/mL — ABNORMAL HIGH (ref 0–486)

## 2019-01-12 ENCOUNTER — Other Ambulatory Visit: Payer: Self-pay

## 2019-01-12 ENCOUNTER — Encounter: Payer: Self-pay | Admitting: Cardiovascular Disease

## 2019-01-12 ENCOUNTER — Ambulatory Visit (INDEPENDENT_AMBULATORY_CARE_PROVIDER_SITE_OTHER): Payer: Medicare Other | Admitting: Cardiovascular Disease

## 2019-01-12 VITALS — BP 138/62 | HR 72 | Ht 70.0 in | Wt 237.4 lb

## 2019-01-12 DIAGNOSIS — I5042 Chronic combined systolic (congestive) and diastolic (congestive) heart failure: Secondary | ICD-10-CM

## 2019-01-12 DIAGNOSIS — I10 Essential (primary) hypertension: Secondary | ICD-10-CM

## 2019-01-12 DIAGNOSIS — I712 Thoracic aortic aneurysm, without rupture, unspecified: Secondary | ICD-10-CM

## 2019-01-12 DIAGNOSIS — I2510 Atherosclerotic heart disease of native coronary artery without angina pectoris: Secondary | ICD-10-CM

## 2019-01-12 NOTE — Patient Instructions (Signed)
Medication Instructions:  Your provider recommends that you continue on your current medications as directed. Please refer to the Current Medication list given to you today.    Labwork: None  Testing/Procedures: None  Follow-Up: You are scheduled to see Richardson Dopp in 6 months.

## 2019-01-12 NOTE — Progress Notes (Signed)
Cardiology Office Note:    Date:  01/13/2019   ID:  Alan [name], DOB 07/18/1940, MRN [id number]  [facility]  Cardiologist:  Tonny BollmanMichael Elenna Spratling, MD  Electrophysiologist:  None   Referring MD: [facility]   Chief Complaint  Patient presents with   Shortness of Breath    History of Present Illness:    Alan BraverKenneth Paul [name] is a 78 y.o. male with a hx of thoracic aortic aneurysm (4.9 cm), chronic combined systolic and diastolic heart failure, and nonobstructive CAD. He was last seen via telemedicine visit in May 2020. He is here with his wife today. The patient is doing well. He denies any recent symptoms of chest pain. He has mild chronic shortness of breath, but denies leg swelling, orthopnea, or PND. He is compliant with his medications. He recently underwent lab and echo studies.   Past Medical History:  Diagnosis Date   Arthritis    Chronic systolic CHF (congestive heart failure) (HCC)    Diabetes mellitus ORAL MED   ED (erectile dysfunction)    Glaucoma of right eye    History of nuclear stress test    MV 1/18: EF 33, no ischemia; high risk   History of prostate cancer    Malfunction of artificial urethral sphincter (HCC)    Malfunction of penile prosthesis (HCC)    NICM (nonischemic cardiomyopathy) (H[facility]) 02/06/2015   LHC 11/14: EF 35, no significant CAD // Echo 7/16: Diff HK worse in inf base, EF 40%, Gr 1 DD, mild AI, mild MR, mod LAE // Echo 5/18: Moderate concentric LVH, EF 35-40, inferolateral and anterolateral AK, normal wall motion, moderate AI, moderate MR, moderate LAE, mild PI   Nocturia    SUI (stress urinary incontinence), male    Thoracic aortic aneurysm without rupture (HCC) 02/06/2015   Chest CTA 1/18:  5.1 cm ascending aortic aneurysm, mild cardiomegaly, mild coronary artery calcification, upper lobe small airway disease, cholelithiasis, 16 mm right thyroid nodule    Past Surgical History:  Procedure  Laterality Date   I & D/ ORIF RIGHT INDEX FINGER  01-19-2005   INSERTION INFLATABLE PENILE PROSTHESIS  06-22-1999   MENTOR ALPHA I   IR GENERIC HISTORICAL  06/17/2016   IR RADIOLOGIST EVAL & MGMT 06/17/2016 Jolaine ClickArthur Hoss, MD [facility] INTERV RAD   LEFT HEART CATHETERIZATION WITH CORONARY ANGIOGRAM N/A 03/15/2013   Procedure: LEFT HEART CATHETERIZATION WITH CORONARY ANGIOGRAM;  Surgeon: Micheline ChapmanMichael D Baylie Drakes, MD;  [facility];  Service: Cardiovascular;  Laterality: N/A;   PENILE PROSTHESIS IMPLANT  09/24/2011   Procedure: PENILE PROTHESIS INFLATABLE;  Surgeon: Garnett FarmMark C Ottelin, MD;  [facility];  Service: Urology;  Laterality: N/A;  REPLACEMENT OF INFLATABLE PENILE PROTHESIS ([place])     PLACEMENT ARTIFICIAL URINARY SPHINTER  01-20-2001   [id number]   REMOVAL AND REPLACEMENT GU SPHINTER CUFF  05-15-2004   STRESS URINARY INCONTINENCE    Current Medications: Current Meds  Medication Sig   aspirin 81 MG tablet Take 81 mg by mouth daily.   b complex vitamins tablet Take 1 tablet by mouth daily.   brimonidine (ALPHAGAN) 0.2 % ophthalmic solution Place 2 drops into both eyes daily.   busPIRone (BUSPAR) 10 MG tablet Take 10 mg by mouth at bedtime as needed.    Cholecalciferol (VITAMIN D) 2000 UNITS CAPS Take 2,000 Units by mouth daily.    dorzolamide-timolol (COSOPT) 22.3-6.8 MG/ML ophthalmic solution Place 1 drop into both eyes 2 (two) times daily.  furosemide (LASIX) 20 MG tablet Take 1 tablet (20 mg total) by mouth daily.   glipiZIDE (GLUCOTROL) 10 MG tablet Take 10 mg by mouth 2 (two) times daily before a meal.   latanoprost (XALATAN) 0.005 % ophthalmic solution Place 1 drop into both eyes at bedtime.    losartan (COZAAR) 25 MG tablet Take 25 mg by mouth daily.   meclizine (ANTIVERT) 25 MG tablet Take 25 mg by mouth as needed.    metFORMIN (GLUCOPHAGE) 500 MG tablet Take 1000mg  in the am and 500mg  in the evening by mouth daily   metoprolol  succinate (TOPROL-XL) 50 MG 24 hr tablet Take 1 tablet (50 mg total) by mouth daily.   omeprazole (PRILOSEC) 40 MG capsule Take 1 capsule by mouth daily.   spironolactone (ALDACTONE) 25 MG tablet Take 1 tablet (25 mg total) by mouth daily.     Allergies:   Statins, Ezetimibe, Simvastatin, and Pravastatin   Social History   Socioeconomic History   Marital status: Divorced    Spouse name: Not on file   Number of children: Not on file   Years of education: Not on file   Highest education level: Not on file  Occupational History   Not on file  Social Needs   Financial resource strain: Not on file   Food insecurity    Worry: Not on file    Inability: Not on file   Transportation needs    Medical: Not on file    Non-medical: Not on file  Tobacco Use   Smoking status: Never Smoker   Smokeless tobacco: Never Used  Substance and Sexual Activity   Alcohol use: No   Drug use: No   Sexual activity: Not on file  Lifestyle   Physical activity    Days per week: Not on file    Minutes per session: Not on file   Stress: Not on file  Relationships   Social connections    Talks on phone: Not on file    Gets together: Not on file    Attends religious service: Not on file    Active member of club or organization: Not on file    Attends meetings of clubs or organizations: Not on file    Relationship status: Not on file  Other Topics Concern   Not on file  Social History Narrative   Not on file     Family History: The patient's family history includes Healthy in his sister and sister; Heart Problems in his mother; Heart attack in his father.  ROS:   Please see the history of present illness.    All other systems reviewed and are negative.  EKGs/Labs/Other Studies Reviewed:    The following studies were reviewed today: Echo 01-10-2019: IMPRESSIONS    1. The left ventricle has mild-moderately reduced systolic function, with an ejection fraction of 40-45%.  The cavity size was normal. Left ventricular diastolic Doppler parameters are consistent with impaired relaxation. Elevated mean left atrial  pressure Left ventricular diffuse hypokinesis.  2. The right ventricle has normal systolic function. The cavity was normal. There is No increase in right ventricular wall thickness.  3. Left atrial size was mildly dilated.  4. Right atrial size was normal.  5. Mild thickening of the mitral valve leaflet. No evidence of mitral valve stenosis.  6. The aortic valve is tricuspid. Mild thickening of the aortic valve. Mild calcification of the aortic valve. Aortic valve regurgitation is moderate by color flow Doppler.  7. Pulmonic valve  regurgitation is moderate by color flow Doppler.  8. The aorta is abnormal unless otherwise noted.  9. There is dilatation of the ascending aorta measuring 49 mm. 10. The average left ventricular global longitudinal strain is -11.1 %.  EKG:  EKG is not ordered today.    Recent Labs: 09/07/2018: Hemoglobin 13.3; Platelets 209 01/10/2019: BUN 26; Creatinine, Ser 1.29; NT-Pro BNP 760; Potassium 5.2; Sodium 141  Recent Lipid Panel    Component Value Date/Time   CHOL 166 09/09/2016 0406   TRIG 157 (H) 09/09/2016 0406   HDL 35 (L) 09/09/2016 0406   CHOLHDL 4.7 09/09/2016 0406   VLDL 31 09/09/2016 0406   LDLCALC 100 (H) 09/09/2016 0406    Physical Exam:    VS:  BP 138/62    Pulse 72    Ht 5\' 10"  (1.778 m)    Wt 237 lb 6.4 oz (107.7 kg)    SpO2 97%    BMI 34.06 kg/m     Wt Readings from Last 3 Encounters:  01/12/19 237 lb 6.4 oz (107.7 kg)  11/29/18 232 lb (105.2 kg)  09/22/18 236 lb (107 kg)     GEN:  Well nourished, well developed in no acute distress HEENT: Normal NECK: No JVD; No carotid bruits LYMPHATICS: No lymphadenopathy CARDIAC: RRR, no murmurs, rubs, gallops RESPIRATORY:  Clear to auscultation without rales, wheezing or rhonchi  ABDOMEN: Soft, non-tender, non-distended MUSCULOSKELETAL:  No edema; No  deformity  SKIN: Warm and dry NEUROLOGIC:  Alert and oriented x 3 PSYCHIATRIC:  Normal affect   ASSESSMENT:    1. Chronic combined systolic and diastolic heart failure ([place])   2. Thoracic aortic aneurysm without rupture ([place])   3. Essential hypertension   4. Coronary artery disease involving native coronary artery of native heart without angina pectoris    PLAN:    In order of problems listed above:  1. Appears stable with NYHA functional class 2 symptoms. Most recent echo reviewed and compared to previous studies. His LVEF has been all over the place, ranging from 30% to 55% on past studies, but in general his LVEF at 40-45% with mild-moderate LV dysfunction is stable. He will continue on current doses of losartan, metoprolol succinate, lasix, and aldactone. His labs demonstrate mild hyperkalemia and I don't think he will tolerate an increase in his ARB. Discussed sodium restriction, efforts at diet and weight loss. 2. Reviewed most recent CTA study with 4.9 cm thoracic aneurysm noted. No significant change from past studies. Followed by Dr Prescott Gum. 3. BP well-controlled on current Rx. 4. No angina. Continue ASA for antiplatelet Rx. Intolerant to statins.    Medication Adjustments/Labs and Tests Ordered: Current medicines are reviewed at length with the patient today.  Concerns regarding medicines are outlined above.  No orders of the defined types were placed in this encounter.  No orders of the defined types were placed in this encounter.   Patient Instructions  Medication Instructions:  Your provider recommends that you continue on your current medications as directed. Please refer to the Current Medication list given to you today.    Labwork: None  Testing/Procedures: None  Follow-Up: You are scheduled to see Richardson Dopp in 6 months.     Signed, Sherren Mocha, MD  01/13/2019 7:28 AM    [facility]

## 2019-01-13 ENCOUNTER — Encounter: Payer: Self-pay | Admitting: Cardiovascular Disease

## 2019-03-14 ENCOUNTER — Telehealth: Payer: Self-pay | Admitting: Cardiovascular Disease

## 2019-03-14 NOTE — Telephone Encounter (Signed)
Pt c/o swelling: STAT is pt has developed SOB within 24 hours  1) How much weight have you gained and in what time span? Pt does not track weight  2) If swelling, where is the swelling located? Feet  3) Are you currently taking a fluid pill? yes  4) Are you currently SOB? no  5) Do you have a log of your daily weights (if so, list)? No. He just watches his feet   6) Have you gained 3 pounds in a day or 5 pounds in a week?   7) Have you traveled recently? No   Patient was just told to call the nurse if his feet swell. He had major heart failure and does not want that to happen again

## 2019-03-14 NOTE — Telephone Encounter (Signed)
The patient is calling because he has been having swelling in his R/L feet over the past few days.  He denies CP/SOB.  He has cut back on salted foods since last OV with Dr Burt Knack as recommended.  He would like to take an extra Lasix 20 mg for a few days and see if that helps reduce swelling.  He will try this and call with update.

## 2019-04-04 ENCOUNTER — Telehealth: Payer: Self-pay | Admitting: Cardiovascular Disease

## 2019-04-04 NOTE — Telephone Encounter (Signed)
Pt c/o swelling: STAT is pt has developed SOB within 24 hours  1) How much weight have you gained and in what time span? Not sure  2) If swelling, where is the swelling located? In the patient feet  3) Are you currently taking a fluid pill? Yes furosemide 20 mg   4) Are you currently SOB? Some   5) Do you have a log of your daily weights (if so, list)? Not sure  6) Have you gained 3 pounds in a day or 5 pounds in a week? no  7) Have you traveled recently? No

## 2019-04-04 NOTE — Telephone Encounter (Signed)
Spoke with the patient's caregiver, who is currently with him. She states he has swelling in his feet and SOB. He is not adding salt to his food and does not eat a lot of canned or frozen foods. He does not elevate his legs because it is uncomfortable.  He called in a few weeks ago and took extra Lasix for a few days. The caregiver states the extra Lasix did not help with the swelling or breathing.  The patient's kidney function was elevated on last check, and with no improvement with extra diuretics, he will continue current medications at this time. He will attempt to elevate feet as much as possible. Scheduled patient for visit with Dr. Burt Knack this Friday. He was grateful for assistance.

## 2019-04-06 ENCOUNTER — Encounter: Payer: Self-pay | Admitting: Cardiovascular Disease

## 2019-04-06 ENCOUNTER — Telehealth: Payer: Self-pay

## 2019-04-06 ENCOUNTER — Other Ambulatory Visit: Payer: Self-pay

## 2019-04-06 ENCOUNTER — Ambulatory Visit (INDEPENDENT_AMBULATORY_CARE_PROVIDER_SITE_OTHER): Payer: Medicare Other | Admitting: Cardiovascular Disease

## 2019-04-06 VITALS — BP 100/50 | HR 75 | Ht 70.0 in | Wt 246.8 lb

## 2019-04-06 DIAGNOSIS — I5043 Acute on chronic combined systolic (congestive) and diastolic (congestive) heart failure: Secondary | ICD-10-CM | POA: Diagnosis not present

## 2019-04-06 DIAGNOSIS — I2510 Atherosclerotic heart disease of native coronary artery without angina pectoris: Secondary | ICD-10-CM | POA: Diagnosis not present

## 2019-04-06 DIAGNOSIS — I712 Thoracic aortic aneurysm, without rupture, unspecified: Secondary | ICD-10-CM

## 2019-04-06 DIAGNOSIS — I10 Essential (primary) hypertension: Secondary | ICD-10-CM | POA: Diagnosis not present

## 2019-04-06 MED ORDER — FUROSEMIDE 40 MG PO TABS: ORAL_TABLET | ORAL | 1 refills | Status: DC

## 2019-04-06 NOTE — Telephone Encounter (Signed)
The patient had an OV with Dr. Burt Knack today.  Informed the patient he will be called Monday to check fluid status and to get daily weights for over the weekend.

## 2019-04-06 NOTE — Patient Instructions (Addendum)
Medication Instructions:  1) INCREASE LASIX to 80 mg in the morning and 40 mg in the afternoon 2) STOP LOSARTAN  Labwork: You will have your labs drawn when you return for your appointment in a couple weeks. You do not need to be fasting.  Follow-Up: You have an appointment with Cecilie Kicks on 04/18/19 at 2:00PM.   Weigh yourself daily over the weekend in the morning, prior to eating. I will call you Monday to see how you are.

## 2019-04-06 NOTE — Progress Notes (Signed)
Cardiology Office Note:    Date:  04/10/2019   ID:  Alan BraverKenneth Paul Jou, DOB 04/13/1941, MRN [id number]  [facility]  [facility]  Cardiologist:  Tonny BollmanMichael Raidyn Breiner, MD  Electrophysiologist:  None   Referring MD: [facility]   Chief Complaint  Patient presents with  . Shortness of Breath    History of Present Illness:    Alan [name] is a 78 y.o. male with a hx of thoracic aortic aneurysm and chronic combined systolic and diastolic heart failure, presenting for follow-up evaluation.  The patient has nonobstructive coronary artery disease based on previous cardiac catheterization.  He was last seen in September 2020.  He had been doing well until the past 2 weeks when he has developed progressive shortness of breath, weakness, poor appetite, and orthopnea.  [facility] and was felt to have pneumonia.  He was started on antibiotics.  He is here today for follow-up evaluation and he continues to feel poorly.  He is very weak with activity.  He is comfortable at rest but reports shortness of breath with low-level activity.  He feels that his abdomen is swollen and he is gained a good bit of weight.  He is also had leg swelling.  He denies any chest pain or pressure.  Past Medical History:  Diagnosis Date  . Arthritis   . Chronic systolic CHF (congestive heart failure) (HCC)   . Diabetes mellitus ORAL MED  . ED (erectile dysfunction)   . Glaucoma of right eye   . History of nuclear stress test    MV 1/18: EF 33, no ischemia; high risk  . History of prostate cancer   . Malfunction of artificial urethral sphincter (HCC)   . Malfunction of penile prosthesis (HCC)   . NICM (nonischemic cardiomyopathy) (HCC) 02/06/2015   LHC 11/14: EF 35, no significant CAD // Echo 7/16: Diff HK worse in inf base, EF 40%, Gr 1 DD, mild AI, mild MR, mod LAE // Echo 5/18: Moderate concentric LVH, EF 35-40, inferolateral and anterolateral AK,  normal wall motion, moderate AI, moderate MR, moderate LAE, mild PI  . Nocturia   . SUI (stress urinary incontinence), male   . Thoracic aortic aneurysm without rupture (HCC) 02/06/2015   Chest CTA 1/18:  5.1 cm ascending aortic aneurysm, mild cardiomegaly, mild coronary artery calcification, upper lobe small airway disease, cholelithiasis, 16 mm right thyroid nodule    Past Surgical History:  Procedure Laterality Date  . I & D/ ORIF RIGHT INDEX FINGER  01-19-2005  . INSERTION INFLATABLE PENILE PROSTHESIS  06-22-1999   MENTOR ALPHA I  . IR GENERIC HISTORICAL  06/17/2016   IR RADIOLOGIST EVAL & MGMT 06/17/2016 Jolaine ClickArthur Hoss, MD [facility] INTERV RAD  . LEFT HEART CATHETERIZATION WITH CORONARY ANGIOGRAM N/A 03/15/2013   Procedure: LEFT HEART CATHETERIZATION WITH CORONARY ANGIOGRAM;  Surgeon: Micheline ChapmanMichael D Curley Fayette, MD;  [facility];  Service: Cardiovascular;  Laterality: N/A;  . PENILE PROSTHESIS IMPLANT  09/24/2011   Procedure: PENILE PROTHESIS INFLATABLE;  Surgeon: Garnett FarmMark C Ottelin, MD;  [facility];  Service: Urology;  Laterality: N/A;  REPLACEMENT OF INFLATABLE PENILE PROTHESIS (COLOPLAST)    . PLACEMENT ARTIFICIAL URINARY SPHINTER  01-20-2001   [id number]  . REMOVAL AND REPLACEMENT GU SPHINTER CUFF  05-15-2004   STRESS URINARY INCONTINENCE    Current Medications: Current Meds  Medication Sig  . aspirin 81 MG tablet Take 81 mg by mouth daily.  .Marland Kitchen  b complex vitamins tablet Take 1 tablet by mouth daily.  . brimonidine (ALPHAGAN) 0.2 % ophthalmic solution Place 2 drops into both eyes daily.  . busPIRone (BUSPAR) 10 MG tablet Take 10 mg by mouth at bedtime as needed.   . Cholecalciferol (VITAMIN D) 2000 UNITS CAPS Take 2,000 Units by mouth daily.   . ciprofloxacin (CIPRO) 500 MG tablet Take 500 mg by mouth 2 (two) times daily.  . dorzolamide-timolol (COSOPT) 22.3-6.8 MG/ML ophthalmic solution Place 1 drop into both eyes 2 (two) times daily.   . furosemide (LASIX) 40 MG  tablet Take 2 tablets (80 mg) in the morning. Take 1 tablet (40 mg) in the afternoon.  Marland Kitchen glipiZIDE (GLUCOTROL) 10 MG tablet Take 10 mg by mouth 2 (two) times daily before a meal.  . latanoprost (XALATAN) 0.005 % ophthalmic solution Place 1 drop into both eyes at bedtime.   . meclizine (ANTIVERT) 25 MG tablet Take 25 mg by mouth as needed.   . metFORMIN (GLUCOPHAGE) 500 MG tablet Take 1,000 mg by mouth 2 (two) times daily. Take 1000mg  in the am and 1000mg  in the evening by mouth daily  . metoprolol succinate (TOPROL-XL) 50 MG 24 hr tablet Take 1 tablet (50 mg total) by mouth daily.  . metroNIDAZOLE (FLAGYL) 500 MG tablet Take 500 mg by mouth 3 (three) times daily.  omeprazole (PRILOSEC) 40 MG capsule Take 1 capsule by mouth daily.  spironolactone (ALDACTONE) 25 MG tablet Take 1 tablet (25 mg total) by mouth daily.  . [DISCONTINUED] furosemide (LASIX) 20 MG tablet Take 1 tablet (20 mg total) by mouth daily. (Patient taking differently: Take 20 mg by mouth 2 (two) times daily. )  . [DISCONTINUED] furosemide (LASIX) 40 MG tablet Take 2 tablets (80 mg) in the morning. Take 1 tablet (40 mg) in the afternoon.  . [DISCONTINUED] losartan (COZAAR) 25 MG tablet Take 25 mg by mouth daily.     Allergies:   Statins, Ezetimibe, Simvastatin, and Pravastatin   Social History   Socioeconomic History  . Marital status: Divorced    Spouse name: Not on file  . Number of children: Not on file  . Years of education: Not on file  . Highest education level: Not on file  Occupational History  . Not on file  Tobacco Use  . Smoking status: Never Smoker  . Smokeless tobacco: Never Used  Substance and Sexual Activity  . Alcohol use: No  . Drug use: No  . Sexual activity: Not on file  Other Topics Concern  . Not on file  Social History Narrative  . Not on file   Social Determinants of Health   Financial Resource Strain:   . Difficulty of Paying Living Expenses: Not on file  Food Insecurity:   .  Worried About Marland Kitchen in the Last Year: Not on file  . Ran Out of Food in the Last Year: Not on file  Transportation Needs:   . Lack of Transportation (Medical): Not on file  . Lack of Transportation (Non-Medical): Not on file  Physical Activity:   . Days of Exercise per Week: Not on file  . Minutes of Exercise per Session: Not on file  Stress:   . Feeling of Stress : Not on file  Social Connections:   . Frequency of Communication with Friends and Family: Not on file  . Frequency of Social Gatherings with Friends and Family: Not on file  . Attends Religious Services: Not on file  .  Active Member of Clubs or Organizations: Not on file  . Attends BankerClub or Organization Meetings: Not on file  . Marital Status: Not on file     Family History: The patient's family history includes Healthy in his sister and sister; Heart Problems in his mother; Heart attack in his father.  ROS:   Please see the history of present illness.    All other systems reviewed and are negative.  EKGs/Labs/Other Studies Reviewed:    The following studies were reviewed today: Echo 01-10-2019: IMPRESSIONS    1. The left ventricle has mild-moderately reduced systolic function, with an ejection fraction of 40-45%. The cavity size was normal. Left ventricular diastolic Doppler parameters are consistent with impaired relaxation. Elevated mean left atrial  pressure Left ventricular diffuse hypokinesis.  2. The right ventricle has normal systolic function. The cavity was normal. There is No increase in right ventricular wall thickness.  3. Left atrial size was mildly dilated.  4. Right atrial size was normal.  5. Mild thickening of the mitral valve leaflet. No evidence of mitral valve stenosis.  6. The aortic valve is tricuspid. Mild thickening of the aortic valve. Mild calcification of the aortic valve. Aortic valve regurgitation is moderate by color flow Doppler.  7. Pulmonic valve regurgitation is  moderate by color flow Doppler.  8. The aorta is abnormal unless otherwise noted.  9. There is dilatation of the ascending aorta measuring 49 mm. 10. The average left ventricular global longitudinal strain is -11.1 %.  FINDINGS  Left Ventricle: The left ventricle has mild-moderately reduced systolic function, with an ejection fraction of 40-45%. The cavity size was normal. There is no increase in left ventricular wall thickness. Left ventricular diastolic Doppler parameters are  consistent with impaired relaxation. Elevated mean left atrial pressure Left ventricular diffuse hypokinesis. The average left ventricular global longitudinal strain is -11.1 %.  Right Ventricle: The right ventricle has normal systolic function. The cavity was normal. There is no increase in right ventricular wall thickness.  Left Atrium: Left atrial size was mildly dilated.  Right Atrium: Right atrial size was normal in size  Interatrial Septum: No atrial level shunt detected by color flow Doppler.  Pericardium: There is no evidence of pericardial effusion.  Mitral Valve: The mitral valve is normal in structure. mild thickening of the mitral valve leaflet. Mitral valve regurgitation is mild by color flow Doppler. No evidence of mitral valve stenosis.  Tricuspid Valve: The tricuspid valve is normal in structure. Tricuspid valve regurgitation was not visualized by color flow Doppler.  Aortic Valve: The aortic valve is tricuspid Mild thickening of the aortic valve. Mild calcification of the aortic valve. Aortic valve regurgitation is moderate by color flow Doppler. There is no evidence of aortic valve stenosis.  Pulmonic Valve: The pulmonic valve was normal in structure. Pulmonic valve regurgitation is moderate by color flow Doppler.  Aorta: The aorta is abnormal unless otherwise noted. There is dilatation of the ascending aorta measuring 49 mm.  Venous: The inferior vena cava is normal in size with  greater than 50% respiratory variability, suggesting right atrial pressure of 3 mmHg.   EKG:  EKG is not ordered today.   Recent Labs: 09/07/2018: Hemoglobin 13.3; Platelets 209 01/10/2019: BUN 26; Creatinine, Ser 1.29; NT-Pro BNP 760; Potassium 5.2; Sodium 141  Recent Lipid Panel    Component Value Date/Time   CHOL 166 09/09/2016 0406   TRIG 157 (H) 09/09/2016 0406   HDL 35 (L) 09/09/2016 0406   CHOLHDL 4.7 09/09/2016  0406   VLDL 31 09/09/2016 0406   LDLCALC 100 (H) 09/09/2016 0406    Physical Exam:    VS:  BP (!) 100/50   Pulse 75   Ht 5\' 10"  (1.778 m)   Wt 246 lb 12.8 oz (111.9 kg)   SpO2 98%   BMI 35.41 kg/m     Wt Readings from Last 3 Encounters:  04/06/19 246 lb 12.8 oz (111.9 kg)  01/12/19 237 lb 6.4 oz (107.7 kg)  11/29/18 232 lb (105.2 kg)    GEN:  Well nourished, well developed in no acute distress HEENT: Normal NECK: JVP moderately elevated; No carotid bruits LYMPHATICS: No lymphadenopathy CARDIAC: RRR, no murmurs, rubs, gallops RESPIRATORY:  Clear to auscultation without rales, wheezing or rhonchi  ABDOMEN: Soft, non-tender, distended MUSCULOSKELETAL:  1+ BL pretibial edema; No deformity  SKIN: Warm and dry NEUROLOGIC:  Alert and oriented x 3 PSYCHIATRIC:  Normal affect   ASSESSMENT:    1. Acute on chronic combined systolic and diastolic CHF (congestive heart failure) (HCC)   2. Thoracic aortic aneurysm without rupture (HCC)   3. Essential hypertension    PLAN:    In order of problems listed above:  1. The patient has clinical findings and symptoms highly suggestive of volume overload and decompensated heart failure.  Recent echocardiogram is reviewed with mild to moderate LV systolic dysfunction, LVEF 40 to 45%.  We discussed potential treatment strategies, including increase of oral diuretic therapy and close follow-up versus hospital admission for IV diuretics.  He does not have hypoxemia or hemodynamic instability and I feel like it is safe to  treat him as an outpatient initially.  I recommended that he increase furosemide to 80 mg in the morning and 40 mg in the afternoon which represents a significant increase from his baseline furosemide dosing.  We will follow-up with him by telephone early next week to make sure that he is responding well.  If he has a suboptimal response and does not lose a significant amount of weight or have improvement in symptoms, he will likely require hospital admission.  If he does well, we will bring him back in 1 to 2 weeks for follow-up labs and close clinical follow-up.  I also recommended that he hold losartan in the short-term to allow for adequate blood pressure to diurese him more aggressively.  All of this is reviewed at length with the patient and his wife today, their questions are answered. 2. Stable findings on most recent CTA.  Maximal dimensions of ascending aortic aneurysm 4.9 cm. 3. Hold losartan as above, increase diuretic therapy.  Medically complex patient with high-level decision making during today's visit in order to decide on inpatient versus outpatient treatment, and management of acute on chronic heart failure.  Medication Adjustments/Labs and Tests Ordered: Current medicines are reviewed at length with the patient today.  Concerns regarding medicines are outlined above.  No orders of the defined types were placed in this encounter.  Meds ordered this encounter  Medications  . DISCONTD: furosemide (LASIX) 40 MG tablet    Sig: Take 2 tablets (80 mg) in the morning. Take 1 tablet (40 mg) in the afternoon.    Dispense:  90 tablet    Refill:  1  . furosemide (LASIX) 40 MG tablet    Sig: Take 2 tablets (80 mg) in the morning. Take 1 tablet (40 mg) in the afternoon.    Dispense:  90 tablet    Refill:  1    Patient  Instructions  Medication Instructions:  1) INCREASE LASIX to 80 mg in the morning and 40 mg in the afternoon 2) STOP LOSARTAN  Labwork: You will have your labs drawn  when you return for your appointment in a couple weeks. You do not need to be fasting.  Follow-Up: You have an appointment with Cecilie Kicks on 04/18/19 at 2:00PM.   Weigh yourself daily over the weekend in the morning, prior to eating. I will call you Monday to see how you are.    Signed, Sherren Mocha, MD  04/10/2019 9:52 AM    [place]

## 2019-04-07 ENCOUNTER — Encounter: Payer: Self-pay | Admitting: Cardiovascular Disease

## 2019-04-09 NOTE — Telephone Encounter (Signed)
Follow Up  Patient is calling back in to go over fluid status and weights over the weekend. Please give patient a call back.

## 2019-04-09 NOTE — Telephone Encounter (Signed)
Spoke with the patient's caregiver. She states the patient is breathing better and doesn't seem to be gasping for air anymore after the increased dose of lasix.  His weight has decreased as well - his weight was 246 lbs at home Friday, 239.8 lbs Saturday morning, 233.2 lbs Sunday morning, and 234 lbs today.  She states the patients legs are not as swollen and the edema waxes and wanes in severity, but overall has improved.  They will continue to try to elevate feet when sitting and limit salt.  They will keep appointment next week for evaluation. She was grateful for follow-up.

## 2019-04-10 ENCOUNTER — Encounter: Payer: Self-pay | Admitting: Cardiovascular Disease

## 2019-04-16 ENCOUNTER — Telehealth: Payer: Self-pay | Admitting: Cardiovascular Disease

## 2019-04-16 NOTE — Telephone Encounter (Signed)
Spoke with pt and obtained permission to speak with caregiver, Malachy Mood.  She states since starting Lasix 80mg  AM/40mg  PM, pt has had issues with tongue swelling, slight worsening in breathing, loss of sense of taste, not eating well and right ankle is red and itching but not swollen.  Swelling is actually improved.  Weight consistent at 233lbs.  Pt complains that it is hard to breath when lying down.  Has not taken Benadryl to see if swelling improves.  Pt was on speaker phone so was able to ask questions.  He says tongue is very dry and swollen.  Says breathing is hard.  Pt winded on the phone and would have to take breaks when speaking.  Advised pt to go to ER for eval d/t difficulty breathing.  He asked if we could just decrease meds and I told him since he is having trouble breathing he really needed to be evaluated.  Pt ask that I speak with Doctor.  Spoke with Dr. Johnsie Cancel, DOD and he agreed pt needed to go to ER.  Spoke with caregiver and pt and made them aware.  They will go to [facility] as this is the closest hospital.

## 2019-04-16 NOTE — Telephone Encounter (Signed)
New Message  Pt c/o medication issue:  1. Name of Medication: furosemide (LASIX) 40 MG tablet  2. How are you currently taking this medication (dosage and times per day)? 4 tablets in the morning. 2 in the afternoon  3. Are you having a reaction (difficulty breathing--STAT)? Not breathing, swelling of the tougue, hard time eating, lost sense of taste, skin on right foot and ankle is red and tingling  4. What is your medication issue? Wants to know if dosage should be changed due to reaction from increase of furosemide dosage and times per day  Please call to discuss

## 2019-04-18 ENCOUNTER — Other Ambulatory Visit: Payer: Self-pay

## 2019-04-18 ENCOUNTER — Ambulatory Visit (INDEPENDENT_AMBULATORY_CARE_PROVIDER_SITE_OTHER): Payer: Medicare Other | Admitting: Cardiology

## 2019-04-18 ENCOUNTER — Encounter: Payer: Self-pay | Admitting: Cardiology

## 2019-04-18 VITALS — BP 128/68 | HR 75 | Ht 70.0 in | Wt 231.8 lb

## 2019-04-18 DIAGNOSIS — R0602 Shortness of breath: Secondary | ICD-10-CM | POA: Diagnosis not present

## 2019-04-18 DIAGNOSIS — I10 Essential (primary) hypertension: Secondary | ICD-10-CM | POA: Diagnosis not present

## 2019-04-18 DIAGNOSIS — Z79899 Other long term (current) drug therapy: Secondary | ICD-10-CM | POA: Diagnosis not present

## 2019-04-18 DIAGNOSIS — I5043 Acute on chronic combined systolic (congestive) and diastolic (congestive) heart failure: Secondary | ICD-10-CM | POA: Diagnosis not present

## 2019-04-18 DIAGNOSIS — I2510 Atherosclerotic heart disease of native coronary artery without angina pectoris: Secondary | ICD-10-CM | POA: Diagnosis not present

## 2019-04-18 MED ORDER — NYSTATIN 100000 UNIT/ML MT SUSP: 5.0000 mL | Freq: Four times a day (QID) | OROMUCOSAL | 1 refills | Status: DC

## 2019-04-18 NOTE — Progress Notes (Signed)
Cardiology Office Note   Date:  04/19/2019   ID:  Jl, Mandler 05-09-1940, MRN [id number]  PCP:  [facility]  Cardiologist:  Dr. [name]    Chief Complaint  Patient presents with  . Shortness of Breath      History of Present Illness: Alan [name] is a 78 y.o. male who presents for   He has a hx of thoracic aortic aneurysm and chronic combined systolic and diastolic heart failure.  The patient has nonobstructive coronary artery disease based on previous cardiac catheterization.  also with ascending aorta of 4.9 cm.    When seen 04/06/19   He had been doing well until the past 2 weeks when he has developed progressive shortness of breath, weakness, poor appetite, and orthopnea.  He was seen in the emergency department at [facility] and was felt to have pneumonia.  He was started on antibiotics.  He continued to feel poorly.  He is very weak with activity.  He is comfortable at rest but reports shortness of breath with low-level activity.  He feels that his abdomen is swollen and he is gained a good bit of weight.  He also had leg swelling.  He denied any chest pain or pressure. lasix increased to 80 in AM and 40 in pm up from 40 mg daily.    Pt on the 21st had very dry tongue and increased SOB.  He was instructed to go to ER. I see no record of this.    Today he was upset because care giver could not come up, but we did allow her up.  Losartan  Is held and resume when improved.   He still has issues with his tongue issue - slick red painful tongue due to antibiotics.  He finished  5 days ago.  He tried his barbecue sauce and it burned - candidiasis --  With his edema he Initially he lost 13 lbs.  Then it has slowed down, he has had less regurgitation of fluids with diuresis. He is to see GI in [place].  Overall he is feeling better but still with tight stomach, lower ext edema has improved.  No chest pain.  He did fall the other day and bump on head  but no changes other wise, he did not pas out.   No chest pain.  Wt today - he did not weigh on our scales but at home he was 231.8 down from 246 lbs.    Past Medical History:  Diagnosis Date  . Arthritis   . Chronic systolic CHF (congestive heart failure) (HCC)   . Diabetes mellitus ORAL MED  . ED (erectile dysfunction)   . Glaucoma of right eye   . History of nuclear stress test    MV 1/18: EF 33, no ischemia; high risk  . History of prostate cancer   . Malfunction of artificial urethral sphincter (HCC)   . Malfunction of penile prosthesis (HCC)   . NICM (nonischemic cardiomyopathy) (HCC) 02/06/2015   LHC 11/14: EF 35, no significant CAD // Echo 7/16: Diff HK worse in inf base, EF 40%, Gr 1 DD, mild AI, mild MR, mod LAE // Echo 5/18: Moderate concentric LVH, EF 35-40, inferolateral and anterolateral AK, normal wall motion, moderate AI, moderate MR, moderate LAE, mild PI  . Nocturia   . SUI (stress urinary incontinence), male   . Thoracic aortic aneurysm without rupture (HCC) 02/06/2015   Chest CTA 1/18:  5.1 cm ascending aortic aneurysm,  mild cardiomegaly, mild coronary artery calcification, upper lobe small airway disease, cholelithiasis, 16 mm right thyroid nodule    Past Surgical History:  Procedure Laterality Date  . I & D/ ORIF RIGHT INDEX FINGER  01-19-2005  . INSERTION INFLATABLE PENILE PROSTHESIS  06-22-1999   MENTOR ALPHA I  . IR GENERIC HISTORICAL  06/17/2016   IR RADIOLOGIST EVAL & MGMT 06/17/2016 Jolaine Click, MD [facility] INTERV RAD  . LEFT HEART CATHETERIZATION WITH CORONARY ANGIOGRAM N/A 03/15/2013   Procedure: LEFT HEART CATHETERIZATION WITH CORONARY ANGIOGRAM;  Surgeon: [name], MD;  Location[facility] CATH LAB;  Service: Cardiovascular;  Laterality: N/A;  . PENILE PROSTHESIS IMPLANT  09/24/2011   Procedure: PENILE PROTHESIS INFLATABLE;  Surgeon: [name], MD;  Location: [facility];  Service: Urology;  Laterality: N/A;  REPLACEMENT OF INFLATABLE  PENILE PROTHESIS (COLOPLAST)    . PLACEMENT ARTIFICIAL URINARY SPHINTER  01-20-2001   [id number]  . REMOVAL AND REPLACEMENT GU SPHINTER CUFF  05-15-2004   STRESS URINARY INCONTINENCE     Current Outpatient Medications  Medication Sig Dispense Refill  . aspirin 81 MG tablet Take 81 mg by mouth daily.    Marland Kitchen b complex vitamins tablet Take 1 tablet by mouth daily.    . brimonidine (ALPHAGAN) 0.2 % ophthalmic solution Place 2 drops into both eyes daily.    . busPIRone (BUSPAR) 10 MG tablet Take 10 mg by mouth at bedtime as needed.     . Cholecalciferol (VITAMIN D) 2000 UNITS CAPS Take 2,000 Units by mouth daily.     . ciprofloxacin (CIPRO) 500 MG tablet Take 500 mg by mouth 2 (two) times daily.    . dorzolamide-timolol (COSOPT) 22.3-6.8 MG/ML ophthalmic solution Place 1 drop into both eyes 2 (two) times daily.     Marland Kitchen glipiZIDE (GLUCOTROL) 10 MG tablet Take 10 mg by mouth 2 (two) times daily before a meal.    . latanoprost (XALATAN) 0.005 % ophthalmic solution Place 1 drop into both eyes at bedtime.     . meclizine (ANTIVERT) 25 MG tablet Take 25 mg by mouth as needed.     . metFORMIN (GLUCOPHAGE) 500 MG tablet Take 1,000 mg by mouth 2 (two) times daily. Take 1000mg  in the am and 1000mg  in the evening by mouth daily    . metoprolol succinate (TOPROL-XL) 50 MG 24 hr tablet Take 1 tablet (50 mg total) by mouth daily. 30 tablet 0  . metroNIDAZOLE (FLAGYL) 500 MG tablet Take 500 mg by mouth 3 (three) times daily.    . Netarsudil-Latanoprost (ROCKLATAN) 0.02-0.005 % SOLN Place 1 drop into the right eye nightly for 30 days.    omeprazole (PRILOSEC) 40 MG capsule Take 1 capsule by mouth daily.    08-05-1988 spironolactone (ALDACTONE) 25 MG tablet Take 1 tablet (25 mg total) by mouth daily. 30 tablet 0  . furosemide (LASIX) 40 MG tablet Take 2 tablets (80 mg total) by mouth daily. 90 tablet 1  . nystatin (MYCOSTATIN) 100000 UNIT/ML suspension Take 5 mLs (500,000 Units total) by mouth 4 (four) times daily. 60 mL 1    No current facility-administered medications for this visit.    Allergies:   Statins, Ezetimibe, Simvastatin, and Pravastatin    Social History:  The patient  reports that he has never smoked. He has never used smokeless tobacco. He reports that he does not drink alcohol or use drugs.   Family History:  The patient's family history includes Healthy in his sister and sister; Heart  Problems in his mother; Heart attack in his father.    ROS:  General:no colds or fevers,+ weight loss - Skin:no rashes or ulcers HEENT:no blurred vision, no congestion. + red tongue CV:see HPI PUL:see HPI GI:no diarrhea constipation or melena, no indigestion GU:no hematuria, no dysuria MS:no joint pain, no claudication Neuro:no syncope, no lightheadedness Endo:no diabetes, no thyroid disease  Wt Readings from Last 3 Encounters:  04/18/19 231 lb 12.8 oz (105.1 kg)  04/06/19 246 lb 12.8 oz (111.9 kg)  01/12/19 237 lb 6.4 oz (107.7 kg)     PHYSICAL EXAM: VS:  BP 128/68   Pulse 75   Ht 5\' 10"  (1.778 m)   Wt 231 lb 12.8 oz (105.1 kg)   SpO2 99%   BMI 33.26 kg/m  , BMI Body mass index is 33.26 kg/m. General:Pleasant affect, NAD Skin:Warm and dry, brisk capillary refill HEENT:normocephalic, sclera clear, mucus membranes moist Neck:supple, no JVD, no bruits  Heart:S1S2 RRR without murmur, gallup, rub or click Lungs:clear without rales, rhonchi, or wheezes NIO:EVOJ, non tender, + BS, do not palpate liver spleen or masses Ext:no lower ext edema, 2+ pedal pulses, 2+ radial pulses Neuro:alert and oriented X 3, MAE, follows commands, + facial symmetry    EKG:  EKG is ordered today. 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    Recent Labs: 04/18/2019: BUN 33; Creatinine, Ser 1.60; Hemoglobin 13.0; NT-Pro BNP 975; Platelets 238; Potassium 4.9; Sodium 138    Lipid Panel    Component Value Date/Time    CHOL 166 09/09/2016 0406   TRIG 157 (H) 09/09/2016 0406   HDL 35 (L) 09/09/2016 0406   CHOLHDL 4.7 09/09/2016 0406   VLDL 31 09/09/2016 0406   LDLCALC 100 (H) 09/09/2016 0406       Other studies Reviewed: Additional studies/ records that were reviewed today include:  Echo 01/10/19 . IMPRESSIONS    1. The left ventricle has mild-moderately reduced systolic function, with an ejection fraction of 40-45%. The cavity size was normal. Left ventricular diastolic Doppler parameters are consistent with impaired relaxation. Elevated mean left atrial  pressure Left ventricular diffuse hypokinesis.  2. The right ventricle has normal systolic function. The cavity was normal. There is No increase in right ventricular wall thickness.  3. Left atrial size was mildly dilated.  4. Right atrial size was normal.  5. Mild thickening of the mitral valve leaflet. No evidence of mitral valve stenosis.  6. The aortic valve is tricuspid. Mild thickening of the aortic valve. Mild calcification of the aortic valve. Aortic valve regurgitation is moderate by color flow Doppler.  7. Pulmonic valve regurgitation is moderate by color flow Doppler.  8. The aorta is abnormal unless otherwise noted.  9. There is dilatation of the ascending aorta measuring 49 mm. 10. The average left ventricular global longitudinal strain is -11.1 %.  FINDINGS  Left Ventricle: The left ventricle has mild-moderately reduced systolic function, with an ejection fraction of 40-45%. The cavity size was normal. There is no increase in left ventricular wall thickness. Left ventricular diastolic Doppler parameters are  consistent with impaired relaxation. Elevated mean left atrial pressure Left ventricular diffuse hypokinesis. The average left ventricular global longitudinal strain is -11.1 %.  Right Ventricle: The right ventricle has normal systolic function. The cavity was normal. There is no increase in right ventricular wall  thickness.  Left Atrium: Left atrial size was mildly dilated.  Right Atrium: Right atrial size was normal in size  Interatrial Septum: No atrial level shunt detected by color flow Doppler.  Pericardium: There is no evidence of pericardial effusion.  Mitral Valve: The mitral valve is normal in structure. mild thickening of the mitral valve leaflet. Mitral valve regurgitation is mild by color flow Doppler. No evidence of mitral valve stenosis.  Tricuspid Valve: The tricuspid valve is normal in structure. Tricuspid valve regurgitation was not visualized by color flow Doppler.  Aortic Valve: The aortic valve is tricuspid Mild thickening of the aortic valve. Mild calcification of the aortic valve. Aortic valve regurgitation is moderate by color flow Doppler. There is no evidence of aortic valve stenosis.  Pulmonic Valve: The pulmonic valve was normal in structure. Pulmonic valve regurgitation is moderate by color flow Doppler.  Aorta: The aorta is abnormal unless otherwise noted. There is dilatation of the ascending aorta measuring 49 mm.  Venous: The inferior vena cava is normal in size with greater than 50% respiratory variability, suggesting right atrial pressure of 3 mmHg.   ASSESSMENT AND PLAN:  1.  Acute on chronic combined systolic and diastolic CHF.  Has improved with increased lasix.  Wt decreased from 246 with Dr. Excell Seltzerooper now 231.8 on home scales.   Will check CBC, BMP today may need to decrease lasix.  2.  SOB improving will check pro BNP   3.  HTN stable continue meds.  4.  Candida of tongue, add nystatin suspension swish and swallow 4 times per day, see if this will help clear.    5.  Severe regurg with liquids - improved with diuresis      Current medicines are reviewed with the patient today.  The patient Has no concerns regarding medicines.  The following changes have been made:  See above Labs/ tests ordered today include:see above  Disposition:   FU:   see above  Signed, Nada BoozerLaura Itzael Liptak, NP  04/19/2019 9:19 PM    [facility] [address], [place], [place]  [address]/ [address] [place], [place] Phone: [phone]) [phone]; Fax: [phone]  [phone]

## 2019-04-18 NOTE — Patient Instructions (Addendum)
Medication Instructions:  Start: Nystatin (mycostatin) 100000 units/ ML- Use 4 times a day after meals, Swish and spit   *If you need a refill on your cardiac medications before your next appointment, please call your pharmacy*  Lab Work: BMET, CBC & PRO BNP - Today   If you have labs (blood work) drawn today and your tests are completely normal, you will receive your results only by: Marland Kitchen MyChart Message (if you have MyChart) OR . A paper copy in the mail If you have any lab test that is abnormal or we need to change your treatment, we will call you to review the results.  Testing/Procedures: None ordered   Follow-Up: You are scheduled to see Cecilie Kicks, NP on 05/09/2019 @ 2:30 PM  Keep follow up appointment with Richardson Dopp PA-C on 07/16/2019 @ 2:15 PM  Other Instructions None

## 2019-04-19 ENCOUNTER — Telehealth: Payer: Self-pay

## 2019-04-19 ENCOUNTER — Encounter: Payer: Self-pay | Admitting: Cardiology

## 2019-04-19 LAB — BASIC METABOLIC PANEL
BUN/Creatinine Ratio: 21 (ref 10–24)
BUN: 33 mg/dL — ABNORMAL HIGH (ref 8–27)
CO2: 25 mmol/L (ref 20–29)
Calcium: 9.7 mg/dL (ref 8.6–10.2)
Chloride: 97 mmol/L (ref 96–106)
Creatinine, Ser: 1.6 mg/dL — ABNORMAL HIGH (ref 0.76–1.27)
GFR calc Af Amer: 47 mL/min/{1.73_m2} — ABNORMAL LOW (ref >59)
GFR calc non Af Amer: 41 mL/min/{1.73_m2} — ABNORMAL LOW (ref >59)
Glucose: 333 mg/dL — ABNORMAL HIGH (ref 65–99)
Potassium: 4.9 mmol/L (ref 3.5–5.2)
Sodium: 138 mmol/L (ref 134–144)

## 2019-04-19 LAB — CBC
Hematocrit: 38.5 % (ref 37.5–51.0)
Hemoglobin: 13 g/dL (ref 13.0–17.7)
MCH: 29.2 pg (ref 26.6–33.0)
MCHC: 33.8 g/dL (ref 31.5–35.7)
MCV: 87 fL (ref 79–97)
Platelets: 238 10*3/uL (ref 150–450)
RBC: 4.45 x10E6/uL (ref 4.14–5.80)
RDW: 13.3 % (ref 11.6–15.4)
WBC: 6.2 10*3/uL (ref 3.4–10.8)

## 2019-04-19 LAB — PRO B NATRIURETIC PEPTIDE: NT-Pro BNP: 975 pg/mL — ABNORMAL HIGH (ref 0–486)

## 2019-04-19 MED ORDER — FUROSEMIDE 40 MG PO TABS: 80.0000 mg | ORAL_TABLET | Freq: Every day | ORAL | 1 refills | Status: DC

## 2019-04-19 NOTE — Telephone Encounter (Signed)
Spoke with pt and spouse, advised of elevated kidney function, recommendation to decrease lasix to 80mg  once daily.

## 2019-04-19 NOTE — Telephone Encounter (Signed)
-----   Message from Isaiah Serge, NP sent at 04/19/2019  8:57 AM EST ----- Kidney function is slightly elevated so decrease lasix to 80 mg every AM only none in afternoon.

## 2019-04-23 ENCOUNTER — Telehealth: Payer: Self-pay | Admitting: Cardiology

## 2019-04-23 NOTE — Telephone Encounter (Signed)
   *  STAT* If patient is at the pharmacy, call can be transferred to refill team.   1. Which medications need to be refilled? (please list name of each medication and dose if known) nystatin (MYCOSTATIN) 100000 UNIT/ML suspension  2. Which pharmacy/location (including street and city if local pharmacy) is medication to be sent to? [facility]  3. Do they need a 30 day or 90 day supply? [place]

## 2019-05-02 ENCOUNTER — Telehealth: Payer: Self-pay | Admitting: Cardiology

## 2019-05-02 DIAGNOSIS — R0602 Shortness of breath: Secondary | ICD-10-CM

## 2019-05-02 DIAGNOSIS — I5042 Chronic combined systolic (congestive) and diastolic (congestive) heart failure: Secondary | ICD-10-CM

## 2019-05-02 NOTE — Telephone Encounter (Signed)
Left message to call back  

## 2019-05-02 NOTE — Telephone Encounter (Signed)
Pt c/o swelling: STAT is pt has developed SOB within 24 hours  1) How much weight have you gained and in what time span? His weight goes up and down a pound or two everyday.   2) If swelling, where is the swelling located? Feet and stomach  3) Are you currently taking a fluid pill? yes  4) Are you currently SOB? yes  5) Do you have a log of your daily weights (if so, list)? No   6) Have you gained 3 pounds in a day or 5 pounds in a week? No   7) Have you traveled recently? No   Originally he was taking osemide (LASIX) 40 MG tablet 2 pills in the morning and one pill at night.  They want to know if he can start taking the one pill at night again since he is retaining fluid.

## 2019-05-03 NOTE — Telephone Encounter (Signed)
Follow up   Patient's caregiver is returning your call. Please call.

## 2019-05-04 MED ORDER — TORSEMIDE 20 MG PO TABS: 40.0000 mg | ORAL_TABLET | Freq: Every day | ORAL | 3 refills | Status: DC

## 2019-05-04 NOTE — Telephone Encounter (Signed)
Spoke with [employer] after confirming plan with Nada Boozer, NP. Instructed Sherol to have patient STOP LASIX and START TORSEMIDE 40 mg daily. [facility] referral placed. Rx faxed to [facility] pharmacy at [phone]. A copy was also placed at check-in for pickup. She was grateful for assistance.

## 2019-05-04 NOTE — Telephone Encounter (Signed)
Spoke with [employer], the patient's caregiver. She is concerned because he is not sleeping at night because he can't lie flat to breathe and he can't get comfortable in the chair or with multiple pillows. He is very fatigued all the time due to lack of sleep. She states his weight is down to 228 lbs, but he isn't eating much lately. He has so much phlegm it makes it hard for him to eat.  His tummy is still very taut (she states this is how he always gages how bad his swelling is). She states his LLE looks "pretty normal" but his RLE is still a little puffy.  He is taking Lasix 80 mg daily. He was taking an extra 20 mg in the afternoon and had some relief and was sleeping better but it was stopped due to worsening kidney function.  He went to the [place] yesterday and was given trazodone 50mg  to help him sleep. He took a half dose last night and did not sleep well and is extremely groggy today.   Sherol understands she will be called back with recommendations.

## 2019-05-04 NOTE — Telephone Encounter (Signed)
Katy let's see if he would be willing to go for [facility] consult. I think it would help to get the HF team involved. In the meantime let's try torsemide 40 mg daily to see if he has a better response.

## 2019-05-07 ENCOUNTER — Telehealth: Payer: Self-pay | Admitting: Cardiovascular Disease

## 2019-05-07 NOTE — Telephone Encounter (Signed)
Spoke with [employer]. Will fax Dr. Earmon Phoenix note to [facility] so they will provide torsemide.  Confirmed appointment with Dr. Shirlee Latch 1/13 at 2:20PM. Provided directions, phone number and parking deck code. Alan [name] was grateful for assistance.

## 2019-05-07 NOTE — Telephone Encounter (Signed)
Follow Up:; ° ° °Returning your call. °

## 2019-05-07 NOTE — Telephone Encounter (Signed)
Spoke with [facility] RN.  Cancelled visit with Nada Boozer and arranged visit with Dr. Shirlee Latch this Wednesday at 2:20PM.   Left message for patient's caregiver to call back to discuss plan.

## 2019-05-07 NOTE — Telephone Encounter (Signed)
Alan [name], Caregiver to the patient had some questions for [facility], Dr. Earmon Phoenix Nurse.  The caregiver states the [facility] needs the [facility] notes that state the reason for the change from furosemide to the new drug.  The [facility] will need this before submitting  The new rx to the pharmacy

## 2019-05-08 ENCOUNTER — Telehealth (HOSPITAL_COMMUNITY): Payer: Self-pay

## 2019-05-08 NOTE — Telephone Encounter (Signed)

## 2019-05-09 ENCOUNTER — Ambulatory Visit (HOSPITAL_COMMUNITY)
Admission: RE | Admit: 2019-05-09 | Discharge: 2019-05-09 | Disposition: A | Discharge status: Home / Self Care | Payer: Medicare Other | Source: Ambulatory Visit | Attending: Cardiology | Admitting: Cardiology

## 2019-05-09 ENCOUNTER — Ambulatory Visit: Payer: Medicare Other | Admitting: Cardiology

## 2019-05-09 ENCOUNTER — Other Ambulatory Visit: Payer: Self-pay

## 2019-05-09 ENCOUNTER — Encounter (HOSPITAL_COMMUNITY): Payer: Self-pay | Admitting: Cardiology

## 2019-05-09 VITALS — BP 124/70 | HR 75 | Wt 239.6 lb

## 2019-05-09 DIAGNOSIS — I428 Other cardiomyopathies: Secondary | ICD-10-CM | POA: Diagnosis not present

## 2019-05-09 DIAGNOSIS — N183 Chronic kidney disease, stage 3 unspecified: Secondary | ICD-10-CM | POA: Insufficient documentation

## 2019-05-09 DIAGNOSIS — Z8249 Family history of ischemic heart disease and other diseases of the circulatory system: Secondary | ICD-10-CM | POA: Diagnosis not present

## 2019-05-09 DIAGNOSIS — Z7982 Long term (current) use of aspirin: Secondary | ICD-10-CM | POA: Diagnosis not present

## 2019-05-09 DIAGNOSIS — Z79899 Other long term (current) drug therapy: Secondary | ICD-10-CM | POA: Insufficient documentation

## 2019-05-09 DIAGNOSIS — E1122 Type 2 diabetes mellitus with diabetic chronic kidney disease: Secondary | ICD-10-CM | POA: Diagnosis not present

## 2019-05-09 DIAGNOSIS — R0602 Shortness of breath: Secondary | ICD-10-CM | POA: Diagnosis not present

## 2019-05-09 DIAGNOSIS — Z7984 Long term (current) use of oral hypoglycemic drugs: Secondary | ICD-10-CM | POA: Insufficient documentation

## 2019-05-09 DIAGNOSIS — I712 Thoracic aortic aneurysm, without rupture, unspecified: Secondary | ICD-10-CM

## 2019-05-09 DIAGNOSIS — I5022 Chronic systolic (congestive) heart failure: Secondary | ICD-10-CM

## 2019-05-09 LAB — BASIC METABOLIC PANEL
Anion gap: 9 (ref 5–15)
BUN: 25 mg/dL — ABNORMAL HIGH (ref 8–23)
CO2: 27 mmol/L (ref 22–32)
Calcium: 9.3 mg/dL (ref 8.9–10.3)
Chloride: 101 mmol/L (ref 98–111)
Creatinine, Ser: 1.52 mg/dL — ABNORMAL HIGH (ref 0.61–1.24)
GFR calc Af Amer: 50 mL/min — ABNORMAL LOW (ref >60)
GFR calc non Af Amer: 44 mL/min — ABNORMAL LOW (ref >60)
Glucose, Bld: 278 mg/dL — ABNORMAL HIGH (ref 70–99)
Potassium: 4.3 mmol/L (ref 3.5–5.1)
Sodium: 137 mmol/L (ref 135–145)

## 2019-05-09 LAB — BRAIN NATRIURETIC PEPTIDE: B Natriuretic Peptide: 352.7 pg/mL — ABNORMAL HIGH (ref 0.0–100.0)

## 2019-05-09 MED ORDER — ENTRESTO 24-26 MG PO TABS: 1.0000 | ORAL_TABLET | Freq: Two times a day (BID) | ORAL | 3 refills | Status: DC

## 2019-05-09 MED ORDER — METOPROLOL SUCCINATE ER 25 MG PO TB24: 25.0000 mg | ORAL_TABLET | Freq: Every day | ORAL | 3 refills | Status: DC

## 2019-05-09 MED ORDER — TORSEMIDE 20 MG PO TABS: 60.0000 mg | ORAL_TABLET | Freq: Every day | ORAL | 3 refills | Status: DC

## 2019-05-09 NOTE — Patient Instructions (Addendum)
DECREASE Toprol XL to 25mg  daily.  START Entresto 24/26mg  twice daily.  INCREASE Torsemide to 60mg  daily.   Your provider has ordered you a home sleep study. **they will call you to schedule shipment**  Your provider requests you have a Cardiac MRI. **they will call you to schedule appointment**  Your provider requests you have a right heart cath. **please see attached "cath instruction letter**  Routine lab work today. Will notify you of abnormal results  Repeat labs in 10 days.  Follow up in 3 weeks.

## 2019-05-09 NOTE — Progress Notes (Signed)
Patient Name:         DOB:       Height:     Weight:  Office Name:         Referring Provider:  Today's Date:  Date:   STOP BANG RISK ASSESSMENT S (snore) Have you been told that you snore?     YES   T (tired) Are you often tired, fatigued, or sleepy during the day?   YES  O (obstruction) Do you stop breathing, choke, or gasp during sleep? YES   P (pressure) Do you have or are you being treated for high blood pressure? YES   B (BMI) Is your body index greater than 35 kg/m? NO   A (age) Are you 78 years old or older? YES   N (neck) Do you have a neck circumference greater than 16 inches?   YES   G (gender) Are you a male? YES   TOTAL STOP/BANG "YES" ANSWERS                                                                        For Office Use Only              Procedure Order Form    YES to 3+ Stop Bang questions OR two clinical symptoms - patient qualifies for WatchPAT (CPT 95800)     Submit: This Form + Patient Face Sheet + Clinical Note via CloudPAT or Fax: [phone]         Clinical Notes: Will consult Sleep Specialist and refer for management of therapy due to patient increased risk of Sleep Apnea. Ordering a sleep study due to the following two clinical symptoms: Excessive daytime sleepiness G47.10 / Gastroesophageal reflux K21.9 / Nocturia R35.1 / Morning Headaches G44.221 / Difficulty concentrating R41.840 / Memory problems or poor judgment G31.84 / Personality changes or irritability R45.4 / Loud snoring R06.83 / Depression F32.9 / Unrefreshed by sleep G47.8 / Impotence N52.9 / History of high blood pressure R03.0 / Insomnia G47.00    I understand that I am proceeding with a home sleep apnea test as ordered by my treating physician. I understand that untreated sleep apnea is a serious cardiovascular risk factor and it is my responsibility to perform the test and seek management for sleep apnea. I will be contacted with the results and be managed for sleep apnea by a  local sleep physician. I will be receiving equipment and further instructions from [facility]. I shall promptly ship back the equipment via the included mailing label. I understand my insurance will be billed for the test and as the patient I am responsible for any insurance related out-of-pocket costs incurred. I have been provided with written instructions and can call for additional video or telephonic instruction, with 24-hour availability of qualified personnel to answer any questions: Patient Help Desk [phone].  Patient Signature ______________________________________________________   Date______________________ Patient Telemedicine Verbal Consent

## 2019-05-09 NOTE — Progress Notes (Signed)
PCP: [facility] Cardiology: Dr. [name] HF Cardiology: Dr. Shirlee Latch  79 y.o. with history of diabetes, CKD stage 3, thoracic aortic aneurysm, and chronic systolic CHF was referred by Dr. [name] for assessment of CHF.  Patient has a long history of CHF, diagnosed around 2014.  Cath in 2014 showed no significant CAD.  Over the years, his EF has ranged from 35-45%, never markedly low.  Most recent echo from 9/20 was reviewed, EF 40-45% with normal RV function.    Over the last 2 years, he reports gradually worsening exertional dyspnea and decreased exercise tolerance.  Currently doing quite poorly per his report.  Weight had been increasing steadily (though down over the last week or two).  He has had to increase his pants size.  He sleeps very poor due to orthopnea and PND.  He is very sleepy during the day.  He is short of breath walking around the house, walking to the mailbox, and walking up any incline.  No chest pain. No lightheadedness unless he coughs hard.  He does have a chronic dry cough.  He tries to watch fluid and salt intake.  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  He was taken off Lasix and started on torsemide about 2 days ago.  He has not noted a difference yet.   ECG (12/20, personally reviewed): NSR, 1st degree AVB, RBBB  Labs (12/20): NT-proBNP 975, hgb 13, K 4.9, creatinine 1.6  PMH: 1. Type II diabetes 2. Thoracic aortic aneurysm: - CTA chest 1/18 with 5.1 cm ascending aorta.  - CTA chest 6/19 with 4.9 cm ascending aorta 3. Chronic systolic CHF: Nonischemic cardiomyopathy.  - LHC (11/14): No significant CAD.  - Echo (7/16): EF 40% - Echo (5/18): EF 35-40% - Echo (9/20): EF 40-45%, normal RV.  4. CKD: Stage 3  Social History   Socioeconomic History  . Marital status: Divorced    Spouse name: Not on file  . Number of children: Not on file  . Years of education: Not on file  . Highest education level: Not on file  Occupational  History  . Not on file  Tobacco Use  . Smoking status: Never Smoker  . Smokeless tobacco: Never Used  Substance and Sexual Activity  . Alcohol use: No  . Drug use: No  . Sexual activity: Not on file  Other Topics Concern  . Not on file  Social History Narrative  . Not on file   Social Determinants of Health   Financial Resource Strain:   . Difficulty of Paying Living Expenses: Not on file  Food Insecurity:   . Worried About Programme researcher, broadcasting/film/video in the Last Year: Not on file  . Ran Out of Food in the Last Year: Not on file  Transportation Needs:   . Lack of Transportation (Medical): Not on file  . Lack of Transportation (Non-Medical): Not on file  Physical Activity:   . Days of Exercise per Week: Not on file  . Minutes of Exercise per Session: Not on file  Stress:   . Feeling of Stress : Not on file  Social Connections:   . Frequency of Communication with Friends and Family: Not on file  . Frequency of Social Gatherings with Friends and Family: Not on file  . Attends Religious Services: Not on file  . Active Member of Clubs or Organizations: Not on file  . Attends Banker Meetings: Not on file  . Marital Status: Not on  file  Intimate Partner Violence:   . Fear of Current or Ex-Partner: Not on file  . Emotionally Abused: Not on file  . Physically Abused: Not on file  . Sexually Abused: Not on file   Family History  Problem Relation Age of Onset  . Heart Problems Mother   . Heart attack Father   . Healthy Sister        NO HEART ISSUES  . Healthy Sister        NO HEART ISSUES   ROS: All systems reviewed and negative except as per HPI.   Current Outpatient Medications  Medication Sig Dispense Refill  . aspirin 81 MG tablet Take 81 mg by mouth daily.    Marland Kitchen b complex vitamins tablet Take 1 tablet by mouth daily.    . brimonidine (ALPHAGAN) 0.2 % ophthalmic solution Place 2 drops into both eyes daily.    . Cholecalciferol (VITAMIN D) 2000 UNITS CAPS Take  2,000 Units by mouth daily.     . dorzolamide-timolol (COSOPT) 22.3-6.8 MG/ML ophthalmic solution Place 1 drop into both eyes 2 (two) times daily.     Marland Kitchen glipiZIDE (GLUCOTROL) 10 MG tablet Take 10 mg by mouth 2 (two) times daily before a meal.    . latanoprost (XALATAN) 0.005 % ophthalmic solution Place 1 drop into both eyes at bedtime.     . meclizine (ANTIVERT) 25 MG tablet Take 25 mg by mouth as needed.     . metFORMIN (GLUCOPHAGE) 500 MG tablet Take 1,000 mg by mouth 2 (two) times daily. Take 1000mg  in the am and 1000mg  in the evening by mouth daily    . metoprolol succinate (TOPROL-XL) 25 MG 24 hr tablet Take 1 tablet (25 mg total) by mouth daily. 30 tablet 3  . Netarsudil-Latanoprost (ROCKLATAN) 0.02-0.005 % SOLN Place 1 drop into the right eye nightly for 30 days.    Marland Kitchen nystatin (MYCOSTATIN) 100000 UNIT/ML suspension Take 5 mLs (500,000 Units total) by mouth 4 (four) times daily. 60 mL 1  . spironolactone (ALDACTONE) 25 MG tablet Take 1 tablet (25 mg total) by mouth daily. 30 tablet 0  . torsemide (DEMADEX) 20 MG tablet Take 3 tablets (60 mg total) by mouth daily. 270 tablet 3  . sacubitril-valsartan (ENTRESTO) 24-26 MG Take 1 tablet by mouth 2 (two) times daily. 60 tablet 3   No current facility-administered medications for this encounter.   BP 124/70   Pulse 75   Wt 108.7 kg (239 lb 9.6 oz)   SpO2 96%   BMI 34.38 kg/m  General: NAD Neck: JVP 8 cm, no thyromegaly or thyroid nodule.  Lungs: Clear to auscultation bilaterally with normal respiratory effort. CV: Nondisplaced PMI.  Heart regular S1/S2 with widely split S2, no S3/S4, no murmur.  1+ right ankle edema (he reports long-term asymmetrical right leg swelling).  No carotid bruit.  Normal pedal pulses.  Abdomen: Soft, nontender, no hepatosplenomegaly, mild distention.  Skin: Intact without lesions or rashes.  Neurologic: Alert and oriented x 3.  Psych: Normal affect. Extremities: No clubbing or cyanosis.  HEENT: Normal.    Assessment/Plan: 1. Chronic systolic CHF: Nonischemic cardiomyopathy by cath in 2014.  Over the years, EF has ranged 35-45%, most recent echo in 9/20 with EF 40-45%. He is very symptomatic, NYHA class IIIb.  Gradually progressive over two years.  Exam is somewhat difficult for volume though his abdomen is mildly distended.  However, REDS clip reading today was 43%, suggesting significant volume overload and fitting his symptoms.  -  There is now robust evidence for Entresto in patients with HF and mid-range EF (40-50%). I would like him to stop lisinopril and in 36 hrs, start on Entresto 24/26 bid.  - With initiation of Entresto, I will decrease Toprol XL to 25 mg daily.  - Increase torsemide to 60 mg daily with symptoms and high REDS clip.  - As exam is somewhat difficult to ascertain on exam, I will arrange for RHC for full hemodynamic evaluation.  We discussed risks/benefits and he agrees to procedure.  - I will arrange for cardiac MRI, ?infiltrative disease such as cardiac amyloidosis.  - BMET/BNP today and repeat BMET in 10 days.  2. CKD: Stage 3.  Follow creatinine closely with diuresis,  3. Suspect OSA: I will arrange for home sleep study.  4. Diabetes: He would be a good candidate for dapagliflozin in the future.  5. Ascending aortic aneurysm: 6/19 CTA showed 4.9 cm ascending aorta.  - Should have re-evaluation at some point this year.   Marca Ancona 05/09/2019

## 2019-05-10 ENCOUNTER — Other Ambulatory Visit (HOSPITAL_COMMUNITY): Payer: Self-pay

## 2019-05-10 MED ORDER — METOPROLOL SUCCINATE ER 25 MG PO TB24: 25.0000 mg | ORAL_TABLET | Freq: Every day | ORAL | 3 refills | Status: DC

## 2019-05-10 MED ORDER — ENTRESTO 24-26 MG PO TABS: 1.0000 | ORAL_TABLET | Freq: Two times a day (BID) | ORAL | 3 refills | Status: DC

## 2019-05-10 NOTE — Telephone Encounter (Signed)
Wife called back requesting Rx to be faxed to Dr Yetta Barre at [facility].  Rx fpr Toprol and Entresto faxed to [phone]. Confirmation received

## 2019-05-10 NOTE — Telephone Encounter (Signed)
Pts wife called to verify dose of toprol XL.  Patient is supposed to take 25mg  daily.  She states she has 100mg  tabs at home. She was going to cut them in quarters. Advised she cannot do that because dose will be inaccurate.   Advised to pick up script from office to take to so he can get medication with correct dose today. She will pick up script this afternoon

## 2019-05-12 ENCOUNTER — Encounter (HOSPITAL_COMMUNITY): Payer: Self-pay

## 2019-05-14 ENCOUNTER — Other Ambulatory Visit (HOSPITAL_COMMUNITY): Payer: Self-pay | Admitting: *Deleted

## 2019-05-14 MED ORDER — ENTRESTO 24-26 MG PO TABS: 1.0000 | ORAL_TABLET | Freq: Two times a day (BID) | ORAL | 3 refills | Status: DC

## 2019-05-15 ENCOUNTER — Other Ambulatory Visit (HOSPITAL_COMMUNITY): Payer: Self-pay

## 2019-05-15 ENCOUNTER — Encounter (HOSPITAL_COMMUNITY): Payer: Self-pay

## 2019-05-15 MED ORDER — ENTRESTO 24-26 MG PO TABS: 1.0000 | ORAL_TABLET | Freq: Two times a day (BID) | ORAL | 3 refills | Status: DC

## 2019-05-16 ENCOUNTER — Other Ambulatory Visit (HOSPITAL_COMMUNITY): Payer: Self-pay

## 2019-05-16 MED ORDER — ENTRESTO 24-26 MG PO TABS: 1.0000 | ORAL_TABLET | Freq: Two times a day (BID) | ORAL | 11 refills | Status: DC

## 2019-05-22 ENCOUNTER — Other Ambulatory Visit (HOSPITAL_COMMUNITY): Payer: Medicare Other

## 2019-05-23 ENCOUNTER — Encounter (HOSPITAL_COMMUNITY): Admission: RE | Payer: Self-pay | Source: Home / Self Care

## 2019-05-23 ENCOUNTER — Ambulatory Visit (HOSPITAL_COMMUNITY): Admission: RE | Admit: 2019-05-23 | Payer: Medicare Other | Source: Home / Self Care | Admitting: Cardiology

## 2019-05-23 SURGERY — RIGHT HEART CATH
Anesthesia: LOCAL

## 2019-05-27 ENCOUNTER — Encounter (HOSPITAL_COMMUNITY): Payer: Self-pay

## 2019-05-28 ENCOUNTER — Encounter (HOSPITAL_COMMUNITY): Payer: Medicare Other | Admitting: Cardiology

## 2019-05-31 ENCOUNTER — Other Ambulatory Visit: Payer: Self-pay

## 2019-05-31 ENCOUNTER — Ambulatory Visit (HOSPITAL_COMMUNITY): Admission: RE | Admit: 2019-05-31 | Payer: Medicare Other | Source: Ambulatory Visit | Admitting: Cardiology

## 2019-06-01 ENCOUNTER — Telehealth (HOSPITAL_COMMUNITY): Payer: Medicare Other | Admitting: Cardiology

## 2019-06-05 ENCOUNTER — Other Ambulatory Visit: Payer: Self-pay

## 2019-06-05 ENCOUNTER — Ambulatory Visit (HOSPITAL_COMMUNITY)
Admission: RE | Admit: 2019-06-05 | Discharge: 2019-06-05 | Disposition: A | Discharge status: Home / Self Care | Payer: Medicare Other | Source: Ambulatory Visit | Attending: Cardiology | Admitting: Cardiology

## 2019-06-05 ENCOUNTER — Encounter (HOSPITAL_COMMUNITY): Payer: Self-pay

## 2019-06-05 DIAGNOSIS — I5022 Chronic systolic (congestive) heart failure: Secondary | ICD-10-CM

## 2019-06-05 DIAGNOSIS — I428 Other cardiomyopathies: Secondary | ICD-10-CM | POA: Diagnosis not present

## 2019-06-05 NOTE — Progress Notes (Addendum)
AVS discussed with caregiver. Questions answered. AVS sent via mychart

## 2019-06-05 NOTE — Patient Instructions (Addendum)
You will be called to schedule the MRI of your heart   Your provider has recommended that you have a home sleep study.  [employer] is the company that does these test.  They will contact you by phone and must speak with you before they can ship the equipment.  Once they have spoken with you they will send the equipment right to your home with instructions on how to set it up.  Once you have completed the test simply box all the equipment back up and mail back to the company.  IF you have any questions or issues with the equipment please call the company directly at [phone].  If your test is positive for sleep apnea and you need a home CPAP machine you will be contacted by Dr Norris Cross office [facility]) to set this up.  Please be aware the sleep study company will call from a 800 number. They will make 3 attempts to reach you. If you do not answer, this will be cancelled.   Your physician recommends that you schedule a follow-up appointment in: 2 weeks with Dr Shirlee Latch     [facility] [id number] [place] [address] Dept: [phone] Loc: [phone]  Draiden Mirsky  06/05/2019  You are scheduled for a Cardiac Catheterization on Thursday, February 18 with Dr. Marca Ancona.  1. Please arrive at the [facility] (Main Entrance A) at [facility]: [address] [place], [place] [address] at 7:00 AM (This time is two hours before your procedure to ensure your preparation). Free valet parking service is available.   Special note: Every effort is made to have your procedure done on time. Please understand that emergencies sometimes delay scheduled procedures.  2. Diet: Do not eat solid foods or drink liquids after midnight.  The patient may have clear liquids until 5am upon the day of the procedure.  3. Labs:    1) At the [facility] Clinic on Monday February 15th,  2021 at 12:30p Garage Code [id number]   2) You will need a pre procedure COVID test      WHEN: Monday February 15th, 2021 at 1:05pm   WHERE: [facility]        [address] [place] [address]  This is a drive thru testing site, you will remain in your car. Be sure to get in the line FOR PROCEDURES Once you have been swabbed you will need to remain home in quarantine until you return for your procedure.   4. Medication instructions in preparation for your procedure:  Contrast Allergy: No   HOLD ALL medications on the morning of the procedure   5. Plan for one night stay--bring personal belongings. 6. Bring a current list of your medications and current insurance cards. 7. You MUST have a responsible person to drive you home. 8. Someone MUST be with you the first 24 hours after you arrive home or your discharge will be delayed. 9. Please wear clothes that are easy to get on and off and wear slip-on shoes.  Thank you for allowing [place] to care for you!   -- [facility]

## 2019-06-05 NOTE — Progress Notes (Signed)
Patient Name: Alan [name]        DOB: 03/03/1971      Height: 5'8    Weight: 239  Office Name: Heart and Vascular, [facility]         Referring Provider: Marca Ancona  Today's Date: 06/05/19  Date:   STOP BANG RISK ASSESSMENT S (snore) Have you been told that you snore?     NO   T (tired) Are you often tired, fatigued, or sleepy during the day?   YES  O (obstruction) Do you stop breathing, choke, or gasp during sleep? YES   P (pressure) Do you have or are you being treated for high blood pressure? NO   B (BMI) Is your body index greater than 35 kg/m? YES   A (age) Are you 87 years old or older? YES   N (neck) Do you have a neck circumference greater than 16 inches?   na   G (gender) Are you a male? YES   TOTAL STOP/BANG "YES" ANSWERS 5                                                                       For Office Use Only              Procedure Order Form    YES to 3+ Stop Bang questions OR two clinical symptoms - patient qualifies for [employer] (CPT 95800)     Submit: This Form + Patient Face Sheet + Clinical Note via CloudPAT or Fax: [phone]         Clinical Notes: Will consult Sleep Specialist and refer for management of therapy due to patient increased risk of Sleep Apnea. Ordering a sleep study due to the following two clinical symptoms: Excessive daytime sleepiness G47.10 /Difficulty concentrating R41.840 / Memory problems or poor judgment G31.84/ Insomnia G47.00    I understand that I am proceeding with a home sleep apnea test as ordered by my treating physician. I understand that untreated sleep apnea is a serious cardiovascular risk factor and it is my responsibility to perform the test and seek management for sleep apnea. I will be contacted with the results and be managed for sleep apnea by a local sleep physician. I will be receiving equipment and further instructions from [facility]. I shall promptly ship back the equipment via  the included mailing label. I understand my insurance will be billed for the test and as the patient I am responsible for any insurance related out-of-pocket costs incurred. I have been provided with written instructions and can call for additional video or telephonic instruction, with 24-hour availability of qualified personnel to answer any questions: Patient Help Desk [phone].  Patient Signature ______________________________________________________   Date______________________ Patient Telemedicine Verbal Consent

## 2019-06-05 NOTE — Progress Notes (Signed)
Heart Failure TeleHealth Note  Due to national recommendations of social distancing due to [place] 19, Audio/video telehealth visit is felt to be most appropriate for this patient at this time.  See MyChart message from today for patient consent regarding telehealth for [facility].  Date:  06/05/2019   ID:  Alan [name], DOB 1941/03/15, MRN [id number]  Location: Home  Provider location: [facility] Type of Visit: Established patient   PCP:  [facility]  Cardiologist:  Sherren Mocha, MD Primary HF: Dr. Aundra Dubin  Chief Complaint: Fatigue   History of Present Illness: Alan [name] is a 79 y.o. male who presents via audio/video conferencing for a telehealth visit today.     he denies symptoms worrisome for COVID 19.   Patient has a history of diabetes, CKD stage 3, thoracic aortic aneurysm, and chronic systolic CHF.  Patient has a long history of CHF, diagnosed around 2014.  Cath in 2014 showed no significant CAD.  Over the years, his EF has ranged from 35-45%, never markedly low.  Echo from 9/20 showed EF 40-45% with normal RV function.    Over the last 2 years, he reports gradually worsening exertional dyspnea and decreased exercise tolerance.  He sleeps very poor due to orthopnea and PND.  He is very sleepy during the day.  He is short of breath walking around the house, walking to the mailbox, and walking up any incline.  No chest pain.   He has had a history of frequent falls, his girlfriend says that he seems to fall asleep standing at times and will fall (does not pass out).  In 1/21, he fell and hit his head.  He was found to have a traumatic subarachnoid hemorrhage and a left radial fracture.  Aspirin was stopped.  He was noted to be in atrial fibrillation during this admission.  He was not anticoagulated due to falls and SAH.  Echo at [facility] in 1/21 showed EF 45-50% with severe LV dilation, moderate LVH, mild-moderate MR, mild-moderate  AI.   Patient is now at home.  He reports fatigue and generally feels poorly.  He gets short of breath with anxiety.  Sometimes he has symptoms consistent with orthopnea. Poor appetite, he has lost weight. He never started on Entresto as he was admitted to the hospital before it came in. No chest pain or palpitations.   Labs (12/20): NT-proBNP 975, hgb 13, K 4.9, creatinine 1.6 Labs (1/21): K 4.3, creatinine 1.5, BNP 353  PMH: 1. Type II diabetes 2. Thoracic aortic aneurysm: - CTA chest 1/18 with 5.1 cm ascending aorta.  - CTA chest 6/19 with 4.9 cm ascending aorta 3. Chronic systolic CHF: Nonischemic cardiomyopathy.  - LHC (11/14): No significant CAD.  - Echo (7/16): EF 40% - Echo (5/18): EF 35-40% - Echo (9/20): EF 40-45%, normal RV.  - Echo (1/21, [facility]): EF 45-50%, moderate LVH, mild-moderate MR, mild-moderate AI.   4. CKD: Stage 3 5. Traumatic subarachnoid hemorrhage (12/20).  6. Atrial fibrillation: Paroxysmal.  7. C-spine arthritis   Social History   Socioeconomic History  . Marital status: Divorced    Spouse name: Not on file  . Number of children: Not on file  . Years of education: Not on file  . Highest education level: Not on file  Occupational History  . Not on file  Tobacco Use  . Smoking status: Never Smoker  . Smokeless tobacco: Never Used  Substance and Sexual Activity  . Alcohol use: No  .  Drug use: No  . Sexual activity: Not on file  Other Topics Concern  . Not on file  Social History Narrative  . Not on file   Social Determinants of Health   Financial Resource Strain:   . Difficulty of Paying Living Expenses: Not on file  Food Insecurity:   . Worried About Programme researcher, broadcasting/film/video in the Last Year: Not on file  . Ran Out of Food in the Last Year: Not on file  Transportation Needs:   . Lack of Transportation (Medical): Not on file  . Lack of Transportation (Non-Medical): Not on file  Physical Activity:   . Days of Exercise per Week: Not on file   . Minutes of Exercise per Session: Not on file  Stress:   . Feeling of Stress : Not on file  Social Connections:   . Frequency of Communication with Friends and Family: Not on file  . Frequency of Social Gatherings with Friends and Family: Not on file  . Attends Religious Services: Not on file  . Active Member of Clubs or Organizations: Not on file  . Attends Banker Meetings: Not on file  . Marital Status: Not on file  Intimate Partner Violence:   . Fear of Current or Ex-Partner: Not on file  . Emotionally Abused: Not on file  . Physically Abused: Not on file  . Sexually Abused: Not on file   Family History  Problem Relation Age of Onset  . Heart Problems Mother   . Heart attack Father   . Healthy Sister        NO HEART ISSUES  . Healthy Sister        NO HEART ISSUES   ROS: All systems reviewed and negative except as per HPI.   Current Outpatient Medications  Medication Sig Dispense Refill  . Alogliptin Benzoate 25 MG TABS Take 25 mg by mouth daily.    Marland Kitchen aspirin 81 MG tablet Take 81 mg by mouth daily.    . brimonidine (ALPHAGAN) 0.2 % ophthalmic solution Place 1 drop into the right eye 3 (three) times daily.     . calcium carbonate (TUMS - DOSED IN MG ELEMENTAL CALCIUM) 500 MG chewable tablet Chew 1 tablet by mouth daily as needed for indigestion or heartburn.    . Cholecalciferol (VITAMIN D) 2000 UNITS CAPS Take 2,000 Units by mouth daily.     Marland Kitchen docusate sodium (COLACE) 50 MG capsule Take 50 mg by mouth 2 (two) times daily.    . dorzolamide-timolol (COSOPT) 22.3-6.8 MG/ML ophthalmic solution Place 1 drop into the right eye 2 (two) times daily.     Marland Kitchen glipiZIDE (GLUCOTROL) 10 MG tablet Take 10 mg by mouth 2 (two) times daily before a meal.    . ibuprofen (ADVIL) 200 MG tablet Take 200 mg by mouth daily as needed for headache or moderate pain.    Marland Kitchen latanoprost (XALATAN) 0.005 % ophthalmic solution Place 1 drop into the left eye at bedtime.     . meclizine  (ANTIVERT) 25 MG tablet Take 25 mg by mouth daily.     . metFORMIN (GLUCOPHAGE) 500 MG tablet Take 1,000 mg by mouth 2 (two) times daily.     . metoprolol succinate (TOPROL-XL) 25 MG 24 hr tablet Take 1 tablet (25 mg total) by mouth daily. 30 tablet 3  . Netarsudil-Latanoprost (ROCKLATAN) 0.02-0.005 % SOLN Place 1 drop into the right eye at bedtime.    Marland Kitchen nystatin (MYCOSTATIN) 100000 UNIT/ML suspension Take 5  mLs (500,000 Units total) by mouth 4 (four) times daily. (Patient not taking: Reported on 05/16/2019) 60 mL 1  . sacubitril-valsartan (ENTRESTO) 24-26 MG Take 1 tablet by mouth 2 (two) times daily. 60 tablet 11  . sodium chloride (OCEAN) 0.65 % SOLN nasal spray Place 1 spray into both nostrils as needed for congestion.    Marland Kitchen spironolactone (ALDACTONE) 25 MG tablet Take 1 tablet (25 mg total) by mouth daily. 30 tablet 0  . torsemide (DEMADEX) 20 MG tablet Take 3 tablets (60 mg total) by mouth daily. 270 tablet 3  . vitamin B-12 (CYANOCOBALAMIN) 500 MCG tablet Take 500 mcg by mouth 2 (two) times daily.     No current facility-administered medications for this encounter.   Exam:  (Video/Tele Health Call; Exam is subjective and or/visual.) General:  Speaks in full sentences. No resp difficulty. Lungs: Normal respiratory effort with conversation.  Abdomen: Non-distended per patient report Extremities: Pt denies edema. Neuro: Alert & oriented x 3.   Assessment/Plan: 1. Chronic systolic CHF: Nonischemic cardiomyopathy by cath in 2014.  Over the years, EF has ranged 35-45%, echo in 9/20 with EF 40-45%. Most recent echo at [facility] was reported to have EF 45-50%. He has been generally quite symptomatic, NYHA class IIIb.  Gradually progressive over two years.  - I was planning to start him on Entresto with low EF, but will hold off for now with recent falls, concern for orthostasis.    - Continue Toprol XL 12.5 mg bid.  - His torsemide has been cut back to 20 mg bid.  - Continue spironolactone 25 mg  daily.   - I will plan on RHC for full hemodynamic evaluation.  He missed cath last week as he was in the hospital.  We again discussed risks/benefits and he agrees to RHC.  - I will arrange for cardiac MRI, ?infiltrative disease such as cardiac amyloidosis.  - Check BMET today.  2. CKD: Stage 3.  Follow creatinine closely with diuresis,  3. Suspect OSA: I will arrange for home sleep study, will be ordered today.  4. Diabetes:  - He would be a good candidate for dapagliflozin in the future.  - Will need to discuss statin use at next appt.  5. Ascending aortic aneurysm: 6/19 CTA showed 4.9 cm ascending aorta.  - Should have re-evaluation at some point this year.  6. Atrial fibrillation: Paroxysmal.  Noted in the hospital when he had SAH.  He is in NSR today. Anticoagulation was not started due to frequent falls and SAH.  - Would hold off anticoagulation for now.   - Think ASA 81 would be reasonable to start soon, likely next appt.   COVID screen The patient does not have any symptoms that suggest any further testing/ screening at this time.  Social distancing reinforced today.  Patient Risk: After full review of this patients clinical status, I feel that they are at moderate risk for cardiac decompensation at this time.  Relevant cardiac medications were reviewed at length with the patient today. The patient does not have concerns regarding their medications at this time.   Recommended follow-up:  2 wks.  Today, I have spent 18 minutes with the patient with telehealth technology discussing the above issues .    Signed, Marca Ancona, MD  06/05/2019 11:00 PM  [facility] [address] Heart and Vascular [place] [address] [phone] (office) [phone] (fax)

## 2019-06-06 ENCOUNTER — Telehealth (HOSPITAL_COMMUNITY): Payer: Self-pay

## 2019-06-06 ENCOUNTER — Other Ambulatory Visit (HOSPITAL_COMMUNITY): Payer: Self-pay

## 2019-06-06 DIAGNOSIS — I5022 Chronic systolic (congestive) heart failure: Secondary | ICD-10-CM

## 2019-06-06 MED ORDER — SODIUM CHLORIDE 0.9% FLUSH: 3.0000 mL | Freq: Two times a day (BID) | INTRAVENOUS | Status: DC

## 2019-06-06 NOTE — Telephone Encounter (Signed)
Order, OV note, stop bang and demographics all faxed to [employer] at [phone] via epic  

## 2019-06-11 ENCOUNTER — Other Ambulatory Visit (HOSPITAL_COMMUNITY)
Admission: RE | Admit: 2019-06-11 | Discharge: 2019-06-11 | Disposition: A | Discharge status: Home / Self Care | Payer: Medicare Other | Source: Ambulatory Visit | Attending: Cardiology | Admitting: Cardiology

## 2019-06-11 ENCOUNTER — Ambulatory Visit (HOSPITAL_COMMUNITY)
Admission: RE | Admit: 2019-06-11 | Discharge: 2019-06-11 | Disposition: A | Discharge status: Home / Self Care | Payer: Medicare Other | Source: Ambulatory Visit | Attending: Cardiology | Admitting: Cardiology

## 2019-06-11 ENCOUNTER — Other Ambulatory Visit: Payer: Self-pay

## 2019-06-11 DIAGNOSIS — I428 Other cardiomyopathies: Secondary | ICD-10-CM | POA: Diagnosis not present

## 2019-06-11 DIAGNOSIS — I5022 Chronic systolic (congestive) heart failure: Secondary | ICD-10-CM | POA: Diagnosis not present

## 2019-06-11 DIAGNOSIS — Z01812 Encounter for preprocedural laboratory examination: Secondary | ICD-10-CM | POA: Insufficient documentation

## 2019-06-11 DIAGNOSIS — Z20822 Contact with and (suspected) exposure to covid-19: Secondary | ICD-10-CM | POA: Insufficient documentation

## 2019-06-11 LAB — CBC
HCT: 36.1 % — ABNORMAL LOW (ref 39.0–52.0)
Hemoglobin: 11.5 g/dL — ABNORMAL LOW (ref 13.0–17.0)
MCH: 27.1 pg (ref 26.0–34.0)
MCHC: 31.9 g/dL (ref 30.0–36.0)
MCV: 85.1 fL (ref 80.0–100.0)
Platelets: 268 10*3/uL (ref 150–400)
RBC: 4.24 MIL/uL (ref 4.22–5.81)
RDW: 13.9 % (ref 11.5–15.5)
WBC: 6.2 10*3/uL (ref 4.0–10.5)
nRBC: 0 % (ref 0.0–0.2)

## 2019-06-11 LAB — BASIC METABOLIC PANEL
Anion gap: 12 (ref 5–15)
BUN: 21 mg/dL (ref 8–23)
CO2: 26 mmol/L (ref 22–32)
Calcium: 9.6 mg/dL (ref 8.9–10.3)
Chloride: 101 mmol/L (ref 98–111)
Creatinine, Ser: 1.35 mg/dL — ABNORMAL HIGH (ref 0.61–1.24)
GFR calc Af Amer: 58 mL/min — ABNORMAL LOW (ref >60)
GFR calc non Af Amer: 50 mL/min — ABNORMAL LOW (ref >60)
Glucose, Bld: 239 mg/dL — ABNORMAL HIGH (ref 70–99)
Potassium: 4.4 mmol/L (ref 3.5–5.1)
Sodium: 139 mmol/L (ref 135–145)

## 2019-06-11 LAB — SARS CORONAVIRUS 2 (TAT 6-24 HRS): SARS Coronavirus 2: NEGATIVE

## 2019-06-13 ENCOUNTER — Telehealth (HOSPITAL_COMMUNITY): Payer: Self-pay | Admitting: *Deleted

## 2019-06-13 NOTE — Telephone Encounter (Signed)
Pts wife called stating he has a heart cath tomorrow. He has some questions about what exactly the cath involves and wants to know the benefits vs the risks of having of having the procedure.  Routed to Dr.McLean and his nurse Marisa Hua

## 2019-06-13 NOTE — Telephone Encounter (Signed)
I called him, he will have cath.

## 2019-06-13 NOTE — Telephone Encounter (Signed)
Pt called back at 12:45pm to say he did not want to proceed with cardiac cath and asked our office to cancel it.cath lab notified.

## 2019-06-14 ENCOUNTER — Encounter (HOSPITAL_COMMUNITY): Admission: RE | Payer: Self-pay | Source: Home / Self Care

## 2019-06-14 ENCOUNTER — Ambulatory Visit (HOSPITAL_COMMUNITY): Admission: RE | Admit: 2019-06-14 | Payer: Medicare Other | Source: Home / Self Care | Admitting: Cardiology

## 2019-06-14 SURGERY — RIGHT HEART CATH
Anesthesia: LOCAL

## 2019-06-19 ENCOUNTER — Telehealth (HOSPITAL_COMMUNITY): Payer: Self-pay

## 2019-06-19 NOTE — Telephone Encounter (Signed)
COVID-19 pre-appointment screening questions: CAREGIVER [facility] ANSWERED QUESTIONS    Do you have a history of COVID-19 or a positive test result in the past 7-10 days? NO  To the best of your knowledge, have you been in close contact with anyone with a confirmed diagnosis of COVID 19? NO  Have you had any one or more of the following: Fever, chills, cough, shortness of breath (out of the normal for you) or any flu-like symptoms?NO  Are you experiencing any of the following symptoms that is new or out of usual for you:NO  . Ear, nose or throat discomfort . Sore throat . Headache . Muscle Pain . Diarrhea . Loss of taste or smell   Reviewed all the following with patient: REVIEWED  . Use of hand sanitizer when entering the building . Everyone is required to wear a mask in the building, if you do not have a mask we are happy to provide you with one when you arrive . NO Visitor guidelines   If patient answers YES to any of questions they must change to a virtual visit and place note in comments about symptoms

## 2019-06-20 ENCOUNTER — Other Ambulatory Visit (HOSPITAL_COMMUNITY): Payer: Self-pay

## 2019-06-20 ENCOUNTER — Telehealth (HOSPITAL_COMMUNITY): Payer: Self-pay | Admitting: Cardiology

## 2019-06-20 ENCOUNTER — Ambulatory Visit (HOSPITAL_COMMUNITY)
Admission: RE | Admit: 2019-06-20 | Discharge: 2019-06-20 | Disposition: A | Discharge status: Home / Self Care | Payer: Medicare Other | Source: Ambulatory Visit | Attending: Cardiology | Admitting: Cardiology

## 2019-06-20 ENCOUNTER — Other Ambulatory Visit: Payer: Self-pay

## 2019-06-20 VITALS — BP 122/58 | HR 66

## 2019-06-20 DIAGNOSIS — N183 Chronic kidney disease, stage 3 unspecified: Secondary | ICD-10-CM | POA: Diagnosis not present

## 2019-06-20 DIAGNOSIS — I712 Thoracic aortic aneurysm, without rupture, unspecified: Secondary | ICD-10-CM

## 2019-06-20 DIAGNOSIS — Z79899 Other long term (current) drug therapy: Secondary | ICD-10-CM | POA: Insufficient documentation

## 2019-06-20 DIAGNOSIS — I5022 Chronic systolic (congestive) heart failure: Secondary | ICD-10-CM | POA: Diagnosis not present

## 2019-06-20 DIAGNOSIS — E1122 Type 2 diabetes mellitus with diabetic chronic kidney disease: Secondary | ICD-10-CM | POA: Insufficient documentation

## 2019-06-20 DIAGNOSIS — I428 Other cardiomyopathies: Secondary | ICD-10-CM | POA: Diagnosis not present

## 2019-06-20 DIAGNOSIS — Z794 Long term (current) use of insulin: Secondary | ICD-10-CM | POA: Insufficient documentation

## 2019-06-20 DIAGNOSIS — Z8249 Family history of ischemic heart disease and other diseases of the circulatory system: Secondary | ICD-10-CM | POA: Insufficient documentation

## 2019-06-20 DIAGNOSIS — R296 Repeated falls: Secondary | ICD-10-CM | POA: Diagnosis not present

## 2019-06-20 DIAGNOSIS — F4310 Post-traumatic stress disorder, unspecified: Secondary | ICD-10-CM | POA: Insufficient documentation

## 2019-06-20 DIAGNOSIS — I480 Paroxysmal atrial fibrillation: Secondary | ICD-10-CM | POA: Diagnosis not present

## 2019-06-20 DIAGNOSIS — I4510 Unspecified right bundle-branch block: Secondary | ICD-10-CM | POA: Diagnosis not present

## 2019-06-20 DIAGNOSIS — I7121 Aneurysm of the ascending aorta, without rupture: Secondary | ICD-10-CM

## 2019-06-20 LAB — BASIC METABOLIC PANEL
Anion gap: 13 (ref 5–15)
BUN: 23 mg/dL (ref 8–23)
CO2: 24 mmol/L (ref 22–32)
Calcium: 9.4 mg/dL (ref 8.9–10.3)
Chloride: 101 mmol/L (ref 98–111)
Creatinine, Ser: 1.29 mg/dL — ABNORMAL HIGH (ref 0.61–1.24)
GFR calc Af Amer: >60 mL/min (ref >60)
GFR calc non Af Amer: 53 mL/min — ABNORMAL LOW (ref >60)
Glucose, Bld: 299 mg/dL — ABNORMAL HIGH (ref 70–99)
Potassium: 4.2 mmol/L (ref 3.5–5.1)
Sodium: 138 mmol/L (ref 135–145)

## 2019-06-20 LAB — CBC
HCT: 36.7 % — ABNORMAL LOW (ref 39.0–52.0)
Hemoglobin: 11.7 g/dL — ABNORMAL LOW (ref 13.0–17.0)
MCH: 26.5 pg (ref 26.0–34.0)
MCHC: 31.9 g/dL (ref 30.0–36.0)
MCV: 83.2 fL (ref 80.0–100.0)
Platelets: 302 10*3/uL (ref 150–400)
RBC: 4.41 MIL/uL (ref 4.22–5.81)
RDW: 13.8 % (ref 11.5–15.5)
WBC: 6.2 10*3/uL (ref 4.0–10.5)
nRBC: 0 % (ref 0.0–0.2)

## 2019-06-20 MED ORDER — TORSEMIDE 20 MG PO TABS: ORAL_TABLET | ORAL | 5 refills | Status: DC

## 2019-06-20 NOTE — Progress Notes (Signed)
Opened in error

## 2019-06-20 NOTE — Patient Instructions (Addendum)
INCREASE Torsemide: Take 40mg  in the morning AND 20 mg in the evening  Labs today We will only contact you if something comes back abnormal or we need to make some changes. Otherwise no news is good news!  You have been ordered for a Cardiac MRI and MRA.  You will get a call to schedule the appointments.  You have been ordered a PYP Scan.  This is done in the Radiology Department of [facility].  When you come for this test please plan to be there 2-3 hours.  You will get a call to schedule this appointment.    Your physician recommends that you schedule a follow-up appointment in: 2 weeks with Dr [facility]   Please call office at [phone] option [phone] if you have any questions or concerns.   At the [facility], you and your health needs are our priority. As part of our continuing mission to provide you with exceptional heart care, we have created designated Provider Care Teams. These Care Teams include your primary Cardiologist (physician) and Advanced Practice Providers (APPs- Physician Assistants and Nurse Practitioners) who all work together to provide you with the care you need, when you need it.   You may see any of the following providers on your designated Care Team at your next follow up: [phone] Dr Marland Kitchen . Dr Arvilla Meres . Marca Ancona, NP . Tonye Becket, PA . Robbie Lis, PharmD   Please be sure to bring in all your medications bottles to every appointment.       Karle Plumber

## 2019-06-20 NOTE — Telephone Encounter (Signed)
Patients wife/care giver called to report they received appt information for cardiac mri on 06/23/19 however patient reports since machine closed he will NOT be able to proceed. Will need procedure scheduled at a facility that has an open mri machine.

## 2019-06-21 LAB — IMMUNOFIXATION, URINE

## 2019-06-21 NOTE — Progress Notes (Signed)
Date:  06/05/2019   ID:  Bebe Liter, DOB 07/26/1940, MRN [id number]   Provider location: [facility] Type of Visit: Established patient   PCP:  [facility]  Cardiologist:  Sherren Mocha, MD Primary HF: Dr. Aundra Dubin  Chief Complaint: Fatigue   History of Present Illness: Alan [name] is a 79 y.o. male who has a history of diabetes, CKD stage 3, thoracic aortic aneurysm, and chronic systolic CHF.  Patient has a long history of CHF, diagnosed around 2014.  Cath in 2014 showed no significant CAD.  Over the years, his EF has ranged from 35-45%, never markedly low.  Echo from 9/20 showed EF 40-45% with normal RV function.    Over the last 2 years, he reports gradually worsening exertional dyspnea and decreased exercise tolerance.  He sleeps very poor due to orthopnea and PND.  He is very sleepy during the day.  He is short of breath walking around the house, walking to the mailbox, and walking up any incline.  No chest pain.   He has had a history of frequent falls, his girlfriend says that he seems to fall asleep standing at times and will fall (does not pass out).  In 1/21, he fell and hit his head.  He was found to have a traumatic subarachnoid hemorrhage and a left radial fracture.  Aspirin was stopped.  He was noted to be in atrial fibrillation during this admission.  He was not anticoagulated due to falls and SAH.  Echo at [facility] in 1/21 showed EF 45-50% with severe LV dilation, moderate LVH, mild-moderate MR, mild-moderate AI.   Patient returns for followup of CHF.  He remains short of breath with mild exertion around the house.  He is fatigued.  No more falls since his last hospitalization.  Still gets lightheaded with standing.  Weight stable at home. Walks with walker. He has followup with neurosurgery at [facility].   Labs (12/20): NT-proBNP 975, hgb 13, K 4.9, creatinine 1.6 Labs (1/21): K 4.3, creatinine 1.5, BNP 353 Labs (2/21): K  4.4, creatinine 1.35, hgb 11.5  ECG (personally reviewed): NSR, RBBB, LAFB  PMH: 1. Type II diabetes 2. Thoracic aortic aneurysm: - CTA chest 1/18 with 5.1 cm ascending aorta.  - CTA chest 6/19 with 4.9 cm ascending aorta 3. Chronic systolic CHF: Nonischemic cardiomyopathy.  - LHC (11/14): No significant CAD.  - Echo (7/16): EF 40% - Echo (5/18): EF 35-40% - Echo (9/20): EF 40-45%, normal RV.  - Echo (1/21, [facility]): EF 45-50%, moderate LVH, mild-moderate MR, mild-moderate AI.   4. CKD: Stage 3 5. Traumatic subarachnoid hemorrhage (12/20).  6. Atrial fibrillation: Paroxysmal.  7. C-spine arthritis   Social History   Socioeconomic History  . Marital status: Divorced    Spouse name: Not on file  . Number of children: Not on file  . Years of education: Not on file  . Highest education level: Not on file  Occupational History  . Not on file  Tobacco Use  . Smoking status: Never Smoker  . Smokeless tobacco: Never Used  Substance and Sexual Activity  . Alcohol use: No  . Drug use: No  . Sexual activity: Not on file  Other Topics Concern  . Not on file  Social History Narrative  . Not on file   Social Determinants of Health   Financial Resource Strain:   . Difficulty of Paying Living Expenses: Not on file  Food Insecurity:   . Worried  About Running Out of Food in the Last Year: Not on file  . Ran Out of Food in the Last Year: Not on file  Transportation Needs:   . Lack of Transportation (Medical): Not on file  . Lack of Transportation (Non-Medical): Not on file  Physical Activity:   . Days of Exercise per Week: Not on file  . Minutes of Exercise per Session: Not on file  Stress:   . Feeling of Stress : Not on file  Social Connections:   . Frequency of Communication with Friends and Family: Not on file  . Frequency of Social Gatherings with Friends and Family: Not on file  . Attends Religious Services: Not on file  . Active Member of Clubs or Organizations: Not on  file  . Attends Banker Meetings: Not on file  . Marital Status: Not on file  Intimate Partner Violence:   . Fear of Current or Ex-Partner: Not on file  . Emotionally Abused: Not on file  . Physically Abused: Not on file  . Sexually Abused: Not on file   Family History  Problem Relation Age of Onset  . Heart Problems Mother   . Heart attack Father   . Healthy Sister        NO HEART ISSUES  . Healthy Sister        NO HEART ISSUES   ROS: All systems reviewed and negative except as per HPI.   Current Outpatient Medications  Medication Sig Dispense Refill  . Alogliptin Benzoate 25 MG TABS Take 25 mg by mouth daily.    . brimonidine (ALPHAGAN) 0.2 % ophthalmic solution Place 1 drop into the right eye 3 (three) times daily.     . calcium carbonate (TUMS - DOSED IN MG ELEMENTAL CALCIUM) 500 MG chewable tablet Chew 1 tablet by mouth daily as needed for indigestion or heartburn.    . Cholecalciferol (VITAMIN D) 2000 UNITS CAPS Take 2,000 Units by mouth daily.     . diphenhydrAMINE-zinc acetate (BENADRYL) cream Apply 1 application topically 3 (three) times daily as needed for itching.    . docusate sodium (COLACE) 100 MG capsule Take 100 mg by mouth 2 (two) times daily.    . dorzolamide-timolol (COSOPT) 22.3-6.8 MG/ML ophthalmic solution Place 1 drop into the right eye 2 (two) times daily.     Marland Kitchen glipiZIDE (GLUCOTROL) 10 MG tablet Take 10 mg by mouth 2 (two) times daily before a meal.    . hydrOXYzine (VISTARIL) 25 MG capsule Take 25 mg by mouth 3 (three) times daily as needed for itching.    Marland Kitchen ibuprofen (ADVIL) 200 MG tablet Take 200 mg by mouth daily as needed for headache or moderate pain.    Marland Kitchen LANTUS SOLOSTAR 100 UNIT/ML Solostar Pen Inject 15 Units into the skin at bedtime.    Marland Kitchen latanoprost (XALATAN) 0.005 % ophthalmic solution Place 1 drop into the left eye at bedtime.     . meclizine (ANTIVERT) 25 MG tablet Take 25 mg by mouth 2 (two) times daily.     . Melatonin 1 MG CAPS  Take 5 mg by mouth at bedtime as needed (sleep).    . metoprolol succinate (TOPROL-XL) 25 MG 24 hr tablet Take 1 tablet (25 mg total) by mouth daily. (Patient taking differently: Take 12.5 mg by mouth in the morning and at bedtime. ) 30 tablet 3  . mirtazapine (REMERON) 15 MG tablet Take 15 mg by mouth at bedtime.    . Netarsudil-Latanoprost (ROCKLATAN) 0.02-0.005 %  SOLN Place 1 drop into the right eye at bedtime.    . polyethylene glycol (MIRALAX / GLYCOLAX) 17 g packet Take 17 g by mouth daily as needed for moderate constipation.    . PREVIDENT 5000 PLUS 1.1 % CREA dental cream Place 1 application onto teeth at bedtime.    . sodium chloride (OCEAN) 0.65 % SOLN nasal spray Place 1 spray into both nostrils as needed for congestion.    . spironolactone (ALDACTONE) 25 MG tablet Take 1 tablet (25 mg total) by mouth daily. 30 tablet 0  . torsemide (DEMADEX) 20 MG tablet Take 2 tablets (40 mg total) by mouth every morning AND 1 tablet (20 mg total) every evening. 90 tablet 5  . traMADol (ULTRAM) 50 MG tablet Take 50 mg by mouth every 8 (eight) hours as needed.    . vitamin B-12 (CYANOCOBALAMIN) 1000 MCG tablet Take 1,000 mcg by mouth in the morning and at bedtime.     No current facility-administered medications for this encounter.   Exam:   BP (!) 122/58   Pulse 66   SpO2 96%  General: NAD Neck: JVP 10-11 cm, no thyromegaly or thyroid nodule.  Lungs: Clear to auscultation bilaterally with normal respiratory effort. CV: Nondisplaced PMI.  Heart regular S1/S2, no S3/S4, no murmur.  1+ edema R>L ankle.  No carotid bruit.  Normal pedal pulses.  Abdomen: Soft, nontender, no hepatosplenomegaly, no distention.  Skin: Intact without lesions or rashes.  Neurologic: Alert and oriented x 3.  Psych: Normal affect. Extremities: No clubbing or cyanosis.  HEENT: Normal.   Assessment/Plan: 1. Chronic systolic CHF: Nonischemic cardiomyopathy by cath in 2014.  Over the years, EF has ranged 35-45%, echo in  9/20 with EF 40-45%. Most recent echo at [facility] was reported to have EF 45-50%. He has been generally quite symptomatic, NYHA class IIIb.  Gradually progressive over two years.  He is volume overloaded.  - I was planning to start him on Entresto with low EF, but will hold off for now with recent falls, concern for orthostasis.    - Continue Toprol XL 12.5 mg bid.  - Increase torsemide to 40 qam/20 qpm.  BMET 10 days.  - Continue spironolactone 25 mg daily.   - I will plan on RHC for full hemodynamic evaluation.  We again discussed risks/benefits and he agrees to RHC.  - Will workup for cardiac amyloidosis.  He does not have a low voltage ECG but has moderate LVH and possible autonomic neuropathy with orthostasis. Will order myeloma panel and urine immunofixation.  Will arrange for PYP scan. He does not think he can do MRI even with sedation due to PTSD.  2. CKD: Stage 3.  Follow creatinine closely with diuresis,  3. Suspect OSA: home sleep study.  4. Diabetes:  - He would be a good candidate for dapagliflozin in the future.  - Will need to discuss statin use at next appt.  5. Ascending aortic aneurysm: 6/19 CTA showed 4.9 cm ascending aorta.  - Should have re-evaluation at some point this year, he does not think he could do an MRA due to claustrophobia so will likely need CT.  6. Atrial fibrillation: Paroxysmal.  Noted in the hospital when he had SAH.  He is in NSR today. Anticoagulation was not started due to frequent falls and SAH.  - Would hold off anticoagulation for now.   - He will see neurosurgery at [facility] soon.  Asked his caregiver to discuss with them when he could   potentially start on Eliquis (though not sure yet that I am going to do this).   Followup in 2 wks  Signed, Marca Ancona, MD  06/21/2019  [facility] [address] [facility] [place] [address] [phone] (office) [phone] (fax)

## 2019-06-21 NOTE — H&P (View-Only) (Signed)
Date:  06/05/2019   ID:  Bebe Liter, DOB 07/26/1940, MRN [id number]   Provider location: [facility] Type of Visit: Established patient   PCP:  [facility]  Cardiologist:  Sherren Mocha, MD Primary HF: Dr. Aundra Dubin  Chief Complaint: Fatigue   History of Present Illness: Alan [name] is a 79 y.o. male who has a history of diabetes, CKD stage 3, thoracic aortic aneurysm, and chronic systolic CHF.  Patient has a long history of CHF, diagnosed around 2014.  Cath in 2014 showed no significant CAD.  Over the years, his EF has ranged from 35-45%, never markedly low.  Echo from 9/20 showed EF 40-45% with normal RV function.    Over the last 2 years, he reports gradually worsening exertional dyspnea and decreased exercise tolerance.  He sleeps very poor due to orthopnea and PND.  He is very sleepy during the day.  He is short of breath walking around the house, walking to the mailbox, and walking up any incline.  No chest pain.   He has had a history of frequent falls, his girlfriend says that he seems to fall asleep standing at times and will fall (does not pass out).  In 1/21, he fell and hit his head.  He was found to have a traumatic subarachnoid hemorrhage and a left radial fracture.  Aspirin was stopped.  He was noted to be in atrial fibrillation during this admission.  He was not anticoagulated due to falls and SAH.  Echo at [facility] in 1/21 showed EF 45-50% with severe LV dilation, moderate LVH, mild-moderate MR, mild-moderate AI.   Patient returns for followup of CHF.  He remains short of breath with mild exertion around the house.  He is fatigued.  No more falls since his last hospitalization.  Still gets lightheaded with standing.  Weight stable at home. Walks with walker. He has followup with neurosurgery at [facility].   Labs (12/20): NT-proBNP 975, hgb 13, K 4.9, creatinine 1.6 Labs (1/21): K 4.3, creatinine 1.5, BNP 353 Labs (2/21): K  4.4, creatinine 1.35, hgb 11.5  ECG (personally reviewed): NSR, RBBB, LAFB  PMH: 1. Type II diabetes 2. Thoracic aortic aneurysm: - CTA chest 1/18 with 5.1 cm ascending aorta.  - CTA chest 6/19 with 4.9 cm ascending aorta 3. Chronic systolic CHF: Nonischemic cardiomyopathy.  - LHC (11/14): No significant CAD.  - Echo (7/16): EF 40% - Echo (5/18): EF 35-40% - Echo (9/20): EF 40-45%, normal RV.  - Echo (1/21, [facility]): EF 45-50%, moderate LVH, mild-moderate MR, mild-moderate AI.   4. CKD: Stage 3 5. Traumatic subarachnoid hemorrhage (12/20).  6. Atrial fibrillation: Paroxysmal.  7. C-spine arthritis   Social History   Socioeconomic History  . Marital status: Divorced    Spouse name: Not on file  . Number of children: Not on file  . Years of education: Not on file  . Highest education level: Not on file  Occupational History  . Not on file  Tobacco Use  . Smoking status: Never Smoker  . Smokeless tobacco: Never Used  Substance and Sexual Activity  . Alcohol use: No  . Drug use: No  . Sexual activity: Not on file  Other Topics Concern  . Not on file  Social History Narrative  . Not on file   Social Determinants of Health   Financial Resource Strain:   . Difficulty of Paying Living Expenses: Not on file  Food Insecurity:   . Worried  About Running Out of Food in the Last Year: Not on file  . Ran Out of Food in the Last Year: Not on file  Transportation Needs:   . Lack of Transportation (Medical): Not on file  . Lack of Transportation (Non-Medical): Not on file  Physical Activity:   . Days of Exercise per Week: Not on file  . Minutes of Exercise per Session: Not on file  Stress:   . Feeling of Stress : Not on file  Social Connections:   . Frequency of Communication with Friends and Family: Not on file  . Frequency of Social Gatherings with Friends and Family: Not on file  . Attends Religious Services: Not on file  . Active Member of Clubs or Organizations: Not on  file  . Attends Banker Meetings: Not on file  . Marital Status: Not on file  Intimate Partner Violence:   . Fear of Current or Ex-Partner: Not on file  . Emotionally Abused: Not on file  . Physically Abused: Not on file  . Sexually Abused: Not on file   Family History  Problem Relation Age of Onset  . Heart Problems Mother   . Heart attack Father   . Healthy Sister        NO HEART ISSUES  . Healthy Sister        NO HEART ISSUES   ROS: All systems reviewed and negative except as per HPI.   Current Outpatient Medications  Medication Sig Dispense Refill  . Alogliptin Benzoate 25 MG TABS Take 25 mg by mouth daily.    . brimonidine (ALPHAGAN) 0.2 % ophthalmic solution Place 1 drop into the right eye 3 (three) times daily.     . calcium carbonate (TUMS - DOSED IN MG ELEMENTAL CALCIUM) 500 MG chewable tablet Chew 1 tablet by mouth daily as needed for indigestion or heartburn.    . Cholecalciferol (VITAMIN D) 2000 UNITS CAPS Take 2,000 Units by mouth daily.     . diphenhydrAMINE-zinc acetate (BENADRYL) cream Apply 1 application topically 3 (three) times daily as needed for itching.    . docusate sodium (COLACE) 100 MG capsule Take 100 mg by mouth 2 (two) times daily.    . dorzolamide-timolol (COSOPT) 22.3-6.8 MG/ML ophthalmic solution Place 1 drop into the right eye 2 (two) times daily.     Marland Kitchen glipiZIDE (GLUCOTROL) 10 MG tablet Take 10 mg by mouth 2 (two) times daily before a meal.    . hydrOXYzine (VISTARIL) 25 MG capsule Take 25 mg by mouth 3 (three) times daily as needed for itching.    Marland Kitchen ibuprofen (ADVIL) 200 MG tablet Take 200 mg by mouth daily as needed for headache or moderate pain.    Marland Kitchen LANTUS SOLOSTAR 100 UNIT/ML Solostar Pen Inject 15 Units into the skin at bedtime.    Marland Kitchen latanoprost (XALATAN) 0.005 % ophthalmic solution Place 1 drop into the left eye at bedtime.     . meclizine (ANTIVERT) 25 MG tablet Take 25 mg by mouth 2 (two) times daily.     . Melatonin 1 MG CAPS  Take 5 mg by mouth at bedtime as needed (sleep).    . metoprolol succinate (TOPROL-XL) 25 MG 24 hr tablet Take 1 tablet (25 mg total) by mouth daily. (Patient taking differently: Take 12.5 mg by mouth in the morning and at bedtime. ) 30 tablet 3  . mirtazapine (REMERON) 15 MG tablet Take 15 mg by mouth at bedtime.    . Netarsudil-Latanoprost (ROCKLATAN) 0.02-0.005 %  SOLN Place 1 drop into the right eye at bedtime.    . polyethylene glycol (MIRALAX / GLYCOLAX) 17 g packet Take 17 g by mouth daily as needed for moderate constipation.    Marland Kitchen PREVIDENT 5000 PLUS 1.1 % CREA dental cream Place 1 application onto teeth at bedtime.    . sodium chloride (OCEAN) 0.65 % SOLN nasal spray Place 1 spray into both nostrils as needed for congestion.    Marland Kitchen spironolactone (ALDACTONE) 25 MG tablet Take 1 tablet (25 mg total) by mouth daily. 30 tablet 0  . torsemide (DEMADEX) 20 MG tablet Take 2 tablets (40 mg total) by mouth every morning AND 1 tablet (20 mg total) every evening. 90 tablet 5  . traMADol (ULTRAM) 50 MG tablet Take 50 mg by mouth every 8 (eight) hours as needed.    . vitamin B-12 (CYANOCOBALAMIN) 1000 MCG tablet Take 1,000 mcg by mouth in the morning and at bedtime.     No current facility-administered medications for this encounter.   Exam:   BP (!) 122/58   Pulse 66   SpO2 96%  General: NAD Neck: JVP 10-11 cm, no thyromegaly or thyroid nodule.  Lungs: Clear to auscultation bilaterally with normal respiratory effort. CV: Nondisplaced PMI.  Heart regular S1/S2, no S3/S4, no murmur.  1+ edema R>L ankle.  No carotid bruit.  Normal pedal pulses.  Abdomen: Soft, nontender, no hepatosplenomegaly, no distention.  Skin: Intact without lesions or rashes.  Neurologic: Alert and oriented x 3.  Psych: Normal affect. Extremities: No clubbing or cyanosis.  HEENT: Normal.   Assessment/Plan: 1. Chronic systolic CHF: Nonischemic cardiomyopathy by cath in 2014.  Over the years, EF has ranged 35-45%, echo in  9/20 with EF 40-45%. Most recent echo at [facility] was reported to have EF 45-50%. He has been generally quite symptomatic, NYHA class IIIb.  Gradually progressive over two years.  He is volume overloaded.  - I was planning to start him on Entresto with low EF, but will hold off for now with recent falls, concern for orthostasis.    - Continue Toprol XL 12.5 mg bid.  - Increase torsemide to 40 qam/20 qpm.  BMET 10 days.  - Continue spironolactone 25 mg daily.   - I will plan on RHC for full hemodynamic evaluation.  We again discussed risks/benefits and he agrees to RHC.  - Will workup for cardiac amyloidosis.  He does not have a low voltage ECG but has moderate LVH and possible autonomic neuropathy with orthostasis. Will order myeloma panel and urine immunofixation.  Will arrange for PYP scan. He does not think he can do MRI even with sedation due to PTSD.  2. CKD: Stage 3.  Follow creatinine closely with diuresis,  3. Suspect OSA: home sleep study.  4. Diabetes:  - He would be a good candidate for dapagliflozin in the future.  - Will need to discuss statin use at next appt.  5. Ascending aortic aneurysm: 6/19 CTA showed 4.9 cm ascending aorta.  - Should have re-evaluation at some point this year, he does not think he could do an MRA due to claustrophobia so will likely need CT.  6. Atrial fibrillation: Paroxysmal.  Noted in the hospital when he had SAH.  He is in NSR today. Anticoagulation was not started due to frequent falls and SAH.  - Would hold off anticoagulation for now.   - He will see neurosurgery at [facility] soon.  Asked his caregiver to discuss with them when he could  potentially start on Eliquis (though not sure yet that I am going to do this).   Followup in 2 wks  Signed, Marca Ancona, MD  06/21/2019  [facility] [address] [facility] [place] [address] [phone] (office) [phone] (fax)

## 2019-06-25 ENCOUNTER — Other Ambulatory Visit (HOSPITAL_COMMUNITY)
Admission: RE | Admit: 2019-06-25 | Discharge: 2019-06-25 | Disposition: A | Discharge status: Home / Self Care | Payer: Medicare Other | Source: Ambulatory Visit | Attending: Cardiology | Admitting: Cardiology

## 2019-06-25 DIAGNOSIS — Z01812 Encounter for preprocedural laboratory examination: Secondary | ICD-10-CM | POA: Insufficient documentation

## 2019-06-25 DIAGNOSIS — Z20822 Contact with and (suspected) exposure to covid-19: Secondary | ICD-10-CM | POA: Diagnosis not present

## 2019-06-25 LAB — MULTIPLE MYELOMA PANEL, SERUM
Albumin SerPl Elph-Mcnc: 3.4 g/dL (ref 2.9–4.4)
Albumin/Glob SerPl: 1.1 (ref 0.7–1.7)
Alpha 1: 0.2 g/dL (ref 0.0–0.4)
Alpha2 Glob SerPl Elph-Mcnc: 1.1 g/dL — ABNORMAL HIGH (ref 0.4–1.0)
B-Globulin SerPl Elph-Mcnc: 1.1 g/dL (ref 0.7–1.3)
Gamma Glob SerPl Elph-Mcnc: 0.8 g/dL (ref 0.4–1.8)
Globulin, Total: 3.1 g/dL (ref 2.2–3.9)
IgA: 143 mg/dL (ref 61–437)
IgG (Immunoglobin G), Serum: 842 mg/dL (ref 603–1613)
IgM (Immunoglobulin M), Srm: 141 mg/dL (ref 15–143)
Total Protein ELP: 6.5 g/dL (ref 6.0–8.5)

## 2019-06-26 LAB — SARS CORONAVIRUS 2 (TAT 6-24 HRS): SARS Coronavirus 2: NEGATIVE

## 2019-06-28 ENCOUNTER — Ambulatory Visit (HOSPITAL_COMMUNITY)
Admission: RE | Admit: 2019-06-28 | Discharge: 2019-06-28 | Disposition: A | Discharge status: Home / Self Care | Payer: Medicare Other | Attending: Cardiology | Admitting: Cardiology

## 2019-06-28 ENCOUNTER — Other Ambulatory Visit: Payer: Self-pay

## 2019-06-28 ENCOUNTER — Encounter (HOSPITAL_COMMUNITY)
Admission: RE | Disposition: A | Discharge status: Home / Self Care | Payer: Self-pay | Source: Home / Self Care | Attending: Cardiology

## 2019-06-28 DIAGNOSIS — Z79899 Other long term (current) drug therapy: Secondary | ICD-10-CM | POA: Insufficient documentation

## 2019-06-28 DIAGNOSIS — Z8249 Family history of ischemic heart disease and other diseases of the circulatory system: Secondary | ICD-10-CM | POA: Insufficient documentation

## 2019-06-28 DIAGNOSIS — F40240 Claustrophobia: Secondary | ICD-10-CM | POA: Insufficient documentation

## 2019-06-28 DIAGNOSIS — I509 Heart failure, unspecified: Secondary | ICD-10-CM

## 2019-06-28 DIAGNOSIS — I712 Thoracic aortic aneurysm, without rupture: Secondary | ICD-10-CM | POA: Insufficient documentation

## 2019-06-28 DIAGNOSIS — I5022 Chronic systolic (congestive) heart failure: Secondary | ICD-10-CM | POA: Insufficient documentation

## 2019-06-28 DIAGNOSIS — Z794 Long term (current) use of insulin: Secondary | ICD-10-CM | POA: Diagnosis not present

## 2019-06-28 DIAGNOSIS — N183 Chronic kidney disease, stage 3 unspecified: Secondary | ICD-10-CM | POA: Insufficient documentation

## 2019-06-28 DIAGNOSIS — I480 Paroxysmal atrial fibrillation: Secondary | ICD-10-CM | POA: Insufficient documentation

## 2019-06-28 DIAGNOSIS — E1122 Type 2 diabetes mellitus with diabetic chronic kidney disease: Secondary | ICD-10-CM | POA: Diagnosis not present

## 2019-06-28 DIAGNOSIS — F4310 Post-traumatic stress disorder, unspecified: Secondary | ICD-10-CM | POA: Diagnosis not present

## 2019-06-28 DIAGNOSIS — I428 Other cardiomyopathies: Secondary | ICD-10-CM | POA: Diagnosis not present

## 2019-06-28 DIAGNOSIS — Z8679 Personal history of other diseases of the circulatory system: Secondary | ICD-10-CM | POA: Diagnosis not present

## 2019-06-28 DIAGNOSIS — R296 Repeated falls: Secondary | ICD-10-CM | POA: Diagnosis not present

## 2019-06-28 HISTORY — PX: RIGHT HEART CATH: CATH118263

## 2019-06-28 LAB — POCT I-STAT EG7
Acid-Base Excess: 1 mmol/L (ref 0.0–2.0)
Bicarbonate: 25.7 mmol/L (ref 20.0–28.0)
Bicarbonate: 26.3 mmol/L (ref 20.0–28.0)
Calcium, Ion: 1.24 mmol/L (ref 1.15–1.40)
Calcium, Ion: 1.25 mmol/L (ref 1.15–1.40)
HCT: 33 % — ABNORMAL LOW (ref 39.0–52.0)
HCT: 34 % — ABNORMAL LOW (ref 39.0–52.0)
Hemoglobin: 11.2 g/dL — ABNORMAL LOW (ref 13.0–17.0)
Hemoglobin: 11.6 g/dL — ABNORMAL LOW (ref 13.0–17.0)
O2 Saturation: 64 %
O2 Saturation: 68 %
Potassium: 4.3 mmol/L (ref 3.5–5.1)
Potassium: 4.4 mmol/L (ref 3.5–5.1)
Sodium: 140 mmol/L (ref 135–145)
Sodium: 140 mmol/L (ref 135–145)
TCO2: 27 mmol/L (ref 22–32)
TCO2: 28 mmol/L (ref 22–32)
pCO2, Ven: 44.6 mmHg (ref 44.0–60.0)
pCO2, Ven: 45.7 mmHg (ref 44.0–60.0)
pH, Ven: 7.369 (ref 7.250–7.430)
pH, Ven: 7.369 (ref 7.250–7.430)
pO2, Ven: 34 mmHg (ref 32.0–45.0)
pO2, Ven: 37 mmHg (ref 32.0–45.0)

## 2019-06-28 LAB — GLUCOSE, CAPILLARY: Glucose-Capillary: 201 mg/dL — ABNORMAL HIGH (ref 70–99)

## 2019-06-28 SURGERY — RIGHT HEART CATH
Anesthesia: LOCAL

## 2019-06-28 MED ORDER — ONDANSETRON HCL 4 MG/2ML IJ SOLN: 4.0000 mg | Freq: Four times a day (QID) | INTRAMUSCULAR | Status: DC | PRN

## 2019-06-28 MED ORDER — SODIUM CHLORIDE 0.9 % IV SOLN: 250.0000 mL | INTRAVENOUS | Status: DC | PRN

## 2019-06-28 MED ORDER — HYDRALAZINE HCL 20 MG/ML IJ SOLN: 10.0000 mg | INTRAMUSCULAR | Status: DC | PRN

## 2019-06-28 MED ORDER — SODIUM CHLORIDE 0.9% FLUSH: 3.0000 mL | INTRAVENOUS | Status: DC | PRN

## 2019-06-28 MED ORDER — LIDOCAINE HCL (PF) 1 % IJ SOLN
INTRAMUSCULAR | Status: AC
  Filled 2019-06-28: qty 30

## 2019-06-28 MED ORDER — SODIUM CHLORIDE 0.9 % IV SOLN: INTRAVENOUS | Status: DC

## 2019-06-28 MED ORDER — HEPARIN (PORCINE) IN NACL 1000-0.9 UT/500ML-% IV SOLN
INTRAVENOUS | Status: AC
  Filled 2019-06-28: qty 500

## 2019-06-28 MED ORDER — SODIUM CHLORIDE 0.9% FLUSH: 3.0000 mL | Freq: Two times a day (BID) | INTRAVENOUS | Status: DC

## 2019-06-28 MED ORDER — ACETAMINOPHEN 325 MG PO TABS: 650.0000 mg | ORAL_TABLET | ORAL | Status: DC | PRN

## 2019-06-28 MED ORDER — LABETALOL HCL 5 MG/ML IV SOLN: 10.0000 mg | INTRAVENOUS | Status: DC | PRN

## 2019-06-28 MED ORDER — HEPARIN SODIUM (PORCINE) 1000 UNIT/ML IJ SOLN
INTRAMUSCULAR | Status: AC
  Filled 2019-06-28: qty 1

## 2019-06-28 MED ORDER — LIDOCAINE HCL (PF) 1 % IJ SOLN
INTRAMUSCULAR | Status: DC | PRN
  Administered 2019-06-28: 1 mL

## 2019-06-28 MED ORDER — HEPARIN (PORCINE) IN NACL 1000-0.9 UT/500ML-% IV SOLN
INTRAVENOUS | Status: DC | PRN
  Administered 2019-06-28: 500 mL

## 2019-06-28 SURGICAL SUPPLY — 8 items
CATH BALLN WEDGE 5F 110CM (CATHETERS) ×1 IMPLANT
PACK CARDIAC CATHETERIZATION (CUSTOM PROCEDURE TRAY) ×2 IMPLANT
PROTECTION STATION PRESSURIZED (MISCELLANEOUS) ×2
SHEATH GLIDE SLENDER 4/5FR (SHEATH) ×1 IMPLANT
STATION PROTECTION PRESSURIZED (MISCELLANEOUS) IMPLANT
TRANSDUCER W/STOPCOCK (MISCELLANEOUS) ×2 IMPLANT
TUBING ART PRESS 72  MALE/FEM (TUBING) ×2
TUBING ART PRESS 72 MALE/FEM (TUBING) IMPLANT

## 2019-06-28 NOTE — Interval H&P Note (Signed)
History and Physical Interval Note:  06/28/2019 8:49 AM  Alan [name]  has presented today for surgery, with the diagnosis of chf.  The various methods of treatment have been discussed with the patient and family. After consideration of risks, benefits and other options for treatment, the patient has consented to  Procedure(s): RIGHT HEART CATH (N/A) as a surgical intervention.  The patient's history has been reviewed, patient examined, no change in status, stable for surgery.  I have reviewed the patient's chart and labs.  Questions were answered to the patient's satisfaction.     Dalphine Cowie [employer]

## 2019-06-28 NOTE — Discharge Instructions (Signed)
Right Heart Catheterization  Resume home medications  Call for any signs or symptoms of infection: fever, redness or warmth at insertion site.

## 2019-06-28 NOTE — Progress Notes (Signed)
Instructed pt to remove underwear, he states he has on a pad and refuses to remove.

## 2019-06-30 ENCOUNTER — Ambulatory Visit (HOSPITAL_COMMUNITY): Payer: Medicare Other

## 2019-07-02 ENCOUNTER — Other Ambulatory Visit (HOSPITAL_COMMUNITY): Payer: Medicare Other

## 2019-07-02 ENCOUNTER — Ambulatory Visit (HOSPITAL_COMMUNITY): Payer: Medicare Other

## 2019-07-04 NOTE — Telephone Encounter (Signed)
Open mri machine not available Patient can further discuss with provider at followup

## 2019-07-05 ENCOUNTER — Encounter (HOSPITAL_COMMUNITY): Payer: Self-pay | Admitting: Cardiology

## 2019-07-05 ENCOUNTER — Other Ambulatory Visit: Payer: Self-pay

## 2019-07-05 ENCOUNTER — Ambulatory Visit (HOSPITAL_COMMUNITY)
Admission: RE | Admit: 2019-07-05 | Discharge: 2019-07-05 | Disposition: A | Discharge status: Home / Self Care | Payer: Medicare Other | Source: Ambulatory Visit | Attending: Cardiology | Admitting: Cardiology

## 2019-07-05 VITALS — BP 110/50 | HR 77 | Wt 228.6 lb

## 2019-07-05 DIAGNOSIS — Z79899 Other long term (current) drug therapy: Secondary | ICD-10-CM | POA: Insufficient documentation

## 2019-07-05 DIAGNOSIS — I428 Other cardiomyopathies: Secondary | ICD-10-CM | POA: Diagnosis not present

## 2019-07-05 DIAGNOSIS — I5022 Chronic systolic (congestive) heart failure: Secondary | ICD-10-CM | POA: Insufficient documentation

## 2019-07-05 DIAGNOSIS — I480 Paroxysmal atrial fibrillation: Secondary | ICD-10-CM | POA: Diagnosis not present

## 2019-07-05 DIAGNOSIS — Z794 Long term (current) use of insulin: Secondary | ICD-10-CM | POA: Diagnosis not present

## 2019-07-05 DIAGNOSIS — I4510 Unspecified right bundle-branch block: Secondary | ICD-10-CM | POA: Diagnosis not present

## 2019-07-05 DIAGNOSIS — E1122 Type 2 diabetes mellitus with diabetic chronic kidney disease: Secondary | ICD-10-CM | POA: Insufficient documentation

## 2019-07-05 DIAGNOSIS — I712 Thoracic aortic aneurysm, without rupture, unspecified: Secondary | ICD-10-CM

## 2019-07-05 DIAGNOSIS — N183 Chronic kidney disease, stage 3 unspecified: Secondary | ICD-10-CM | POA: Diagnosis not present

## 2019-07-05 DIAGNOSIS — R296 Repeated falls: Secondary | ICD-10-CM | POA: Diagnosis not present

## 2019-07-05 DIAGNOSIS — Z8249 Family history of ischemic heart disease and other diseases of the circulatory system: Secondary | ICD-10-CM | POA: Diagnosis not present

## 2019-07-05 DIAGNOSIS — F4310 Post-traumatic stress disorder, unspecified: Secondary | ICD-10-CM | POA: Diagnosis not present

## 2019-07-05 LAB — BASIC METABOLIC PANEL
Anion gap: 12 (ref 5–15)
BUN: 24 mg/dL — ABNORMAL HIGH (ref 8–23)
CO2: 25 mmol/L (ref 22–32)
Calcium: 8.9 mg/dL (ref 8.9–10.3)
Chloride: 99 mmol/L (ref 98–111)
Creatinine, Ser: 1.3 mg/dL — ABNORMAL HIGH (ref 0.61–1.24)
GFR calc Af Amer: >60 mL/min (ref >60)
GFR calc non Af Amer: 53 mL/min — ABNORMAL LOW (ref >60)
Glucose, Bld: 340 mg/dL — ABNORMAL HIGH (ref 70–99)
Potassium: 4.4 mmol/L (ref 3.5–5.1)
Sodium: 136 mmol/L (ref 135–145)

## 2019-07-05 MED ORDER — ROSUVASTATIN CALCIUM 5 MG PO TABS: 5.0000 mg | ORAL_TABLET | Freq: Every day | ORAL | 11 refills | Status: AC

## 2019-07-05 MED ORDER — ASPIRIN EC 81 MG PO TBEC: 81.0000 mg | DELAYED_RELEASE_TABLET | Freq: Every day | ORAL | 3 refills | Status: AC

## 2019-07-05 NOTE — Patient Instructions (Signed)
Labs done today. We will contact you only if your labs are abnormal.  START Crestor 5mg (1 tab) by mouth once daily  START Asprin 81mg (1 tab) by mouth once daily   No other medication changes were made. Please continue all other medication as prescribed.   Your physician recommends that you schedule a follow-up appointment in: 2 months with Dr.  Dr. request that you have a chest CTA done. Someone from our office will contact you about scheduling this appointment.   At the [facility], you and your health needs are our priority. As part of our continuing mission to provide you with exceptional heart care, we have created designated Provider Care Teams. These Care Teams include your primary Cardiologist (physician) and Advanced Practice Providers (APPs- Physician Assistants and Nurse Practitioners) who all work together to provide you with the care you need, when you need it.   You may see any of the following providers on your designated Care Team at your next follow up: Shirlee Latch Dr Shirlee Latch . Dr Marland Kitchen . Arvilla Meres, NP . Marca Ancona, PA . Tonye Becket, PharmD   Please be sure to bring in all your medications bottles to every appointment.

## 2019-07-06 ENCOUNTER — Encounter (HOSPITAL_COMMUNITY): Payer: Medicare Other

## 2019-07-08 NOTE — Progress Notes (Signed)
Date:  06/05/2019   ID:  Alan [name], DOB 07/08/1940, MRN [id number]   Provider location: [place] [facility] Type of Visit: Established patient   PCP:  [facility]  Cardiologist:  Sherren Mocha, MD Primary HF: Dr. Aundra Dubin  Chief Complaint: Fatigue   History of Present Illness: Alan [name] is a 79 y.o. male who has a history of diabetes, CKD stage 3, thoracic aortic aneurysm, and chronic systolic CHF.  Patient has a long history of CHF, diagnosed around 2014.  Cath in 2014 showed no significant CAD.  Over the years, his EF has ranged from 35-45%, never markedly low.  Echo from 9/20 showed EF 40-45% with normal RV function.    Over the last 2 years, he reports gradually worsening exertional dyspnea and decreased exercise tolerance.  He sleeps very poor due to orthopnea and PND.  He is very sleepy during the day.  He is short of breath walking around the house, walking to the mailbox, and walking up any incline.  No chest pain.   He has had a history of frequent falls, his girlfriend says that he seems to fall asleep standing at times and will fall (does not pass out).  In 1/21, he fell and hit his head.  He was found to have a traumatic subarachnoid hemorrhage and a left radial fracture.  Aspirin was stopped.  He was noted to be in atrial fibrillation during this admission.  He was not anticoagulated due to falls and SAH.  Echo at [facility] in 1/21 showed EF 45-50% with severe LV dilation, moderate LVH, mild-moderate MR, mild-moderate AI.   Patient had [place] in 3/21, showing optimized filling pressures and normal cardiac index.    Patient returns for followup of CHF.  He is doing better overall.  Able to walk to mailbox without dyspnea.  No chest pain.  No orthopnea/PND.  Still short of breath walking longer distances.  Still sleeps poorly and is anxious, thought to be due to PTSD.  No palpitations.  Poor balance, uses walker or cane.   Labs (12/20):  NT-proBNP 975, hgb 13, K 4.9, creatinine 1.6 Labs (1/21): K 4.3, creatinine 1.5, BNP 353 Labs (2/21): K 4.4, creatinine 1.35 => 1.29, hgb 11.5, myeloma panel negative, urine immunofixation negative  ECG (personally reviewed): NSR,RBBB, 1st degree AVB  PMH: 1. Type II diabetes 2. Thoracic aortic aneurysm: - CTA chest 1/18 with 5.1 cm ascending aorta.  - CTA chest 6/19 with 4.9 cm ascending aorta 3. Chronic systolic CHF: Nonischemic cardiomyopathy.  - LHC (11/14): No significant CAD.  - Echo (7/16): EF 40% - Echo (5/18): EF 35-40% - Echo (9/20): EF 40-45%, normal RV.  - Echo (1/21, [facility]): EF 45-50%, moderate LVH, mild-moderate MR, mild-moderate AI.   - RHC (3/21): mean RA 1, PA 39/7, mean PCWP 14, CI 2.53 4. CKD: Stage 3 5. Traumatic subarachnoid hemorrhage (12/20).  6. Atrial fibrillation: Paroxysmal.  7. C-spine arthritis 8. PTSD   Social History   Socioeconomic History  . Marital status: Divorced    Spouse name: Not on file  . Number of children: Not on file  . Years of education: Not on file  . Highest education level: Not on file  Occupational History  . Not on file  Tobacco Use  . Smoking status: Never Smoker  . Smokeless tobacco: Never Used  Substance and Sexual Activity  . Alcohol use: No  . Drug use: No  . Sexual activity: Not on file  Other  Topics Concern  . Not on file  Social History Narrative  . Not on file   Social Determinants of Health   Financial Resource Strain:   . Difficulty of Paying Living Expenses:   Food Insecurity:   . Worried About Programme researcher, broadcasting/film/video in the Last Year:   . Barista in the Last Year:   Transportation Needs:   . Freight forwarder (Medical):   Marland Kitchen Lack of Transportation (Non-Medical):   Physical Activity:   . Days of Exercise per Week:   . Minutes of Exercise per Session:   Stress:   . Feeling of Stress :   Social Connections:   . Frequency of Communication with Friends and Family:   . Frequency of  Social Gatherings with Friends and Family:   . Attends Religious Services:   . Active Member of Clubs or Organizations:   . Attends Banker Meetings:   Marland Kitchen Marital Status:   Intimate Partner Violence:   . Fear of Current or Ex-Partner:   . Emotionally Abused:   Marland Kitchen Physically Abused:   . Sexually Abused:    Family History  Problem Relation Age of Onset  . Heart Problems Mother   . Heart attack Father   . Healthy Sister        NO HEART ISSUES  . Healthy Sister        NO HEART ISSUES   ROS: All systems reviewed and negative except as per HPI.   Current Outpatient Medications  Medication Sig Dispense Refill  . Alogliptin Benzoate 25 MG TABS Take 12.5 mg by mouth daily.    . brimonidine (ALPHAGAN) 0.2 % ophthalmic solution Place 1 drop into the right eye 3 (three) times daily.     . calcium carbonate (TUMS - DOSED IN MG ELEMENTAL CALCIUM) 500 MG chewable tablet Chew 1 tablet by mouth daily as needed for indigestion or heartburn.    . Cholecalciferol (VITAMIN D) 2000 UNITS CAPS Take 2,000 Units by mouth daily.     . diphenhydrAMINE-zinc acetate (BENADRYL) cream Apply 1 application topically 3 (three) times daily as needed for itching.    . docusate sodium (COLACE) 100 MG capsule Take 100 mg by mouth 2 (two) times daily.    . dorzolamide-timolol (COSOPT) 22.3-6.8 MG/ML ophthalmic solution Place 1 drop into the right eye 2 (two) times daily.     Marland Kitchen glipiZIDE (GLUCOTROL) 10 MG tablet Take 10 mg by mouth 2 (two) times daily before a meal.    . ibuprofen (ADVIL) 200 MG tablet Take 200 mg by mouth daily as needed for headache or moderate pain.    Marland Kitchen LANTUS SOLOSTAR 100 UNIT/ML Solostar Pen Inject 15 Units into the skin at bedtime.    Marland Kitchen latanoprost (XALATAN) 0.005 % ophthalmic solution Place 1 drop into the left eye at bedtime.     . meclizine (ANTIVERT) 25 MG tablet Take 25 mg by mouth 2 (two) times daily.     . metFORMIN (GLUCOPHAGE) 500 MG tablet Take 500 mg by mouth 2 (two) times  daily with a meal.    . metoprolol succinate (TOPROL-XL) 25 MG 24 hr tablet Take 12.5 mg by mouth 2 (two) times daily.    . Netarsudil-Latanoprost (ROCKLATAN) 0.02-0.005 % SOLN Place 1 drop into the right eye at bedtime.    . polyethylene glycol (MIRALAX / GLYCOLAX) 17 g packet Take 17 g by mouth daily as needed for moderate constipation.    . sodium chloride (OCEAN) 0.65 %  SOLN nasal spray Place 1 spray into both nostrils as needed for congestion.    Marland Kitchen spironolactone (ALDACTONE) 25 MG tablet Take 1 tablet (25 mg total) by mouth daily. 30 tablet 0  . torsemide (DEMADEX) 20 MG tablet Take 40 mg by mouth daily.    . traMADol (ULTRAM) 50 MG tablet Take 50 mg by mouth every 8 (eight) hours as needed.    . vitamin B-12 (CYANOCOBALAMIN) 1000 MCG tablet Take 1,000 mcg by mouth in the morning and at bedtime.    Marland Kitchen aspirin EC 81 MG tablet Take 1 tablet (81 mg total) by mouth daily. 90 tablet 3  . rosuvastatin (CRESTOR) 5 MG tablet Take 1 tablet (5 mg total) by mouth at bedtime. 30 tablet 11   No current facility-administered medications for this encounter.   Exam:   BP (!) 110/50   Pulse 77   Wt 103.7 kg (228 lb 9.6 oz)   SpO2 96%   BMI 32.80 kg/m  General: NAD Neck: No JVD, no thyromegaly or thyroid nodule.  Lungs: Clear to auscultation bilaterally with normal respiratory effort. CV: Nondisplaced PMI.  Heart regular S1/S2, no S3/S4, no murmur.  No peripheral edema.  No carotid bruit.  Normal pedal pulses.  Abdomen: Soft, nontender, no hepatosplenomegaly, no distention.  Skin: Intact without lesions or rashes.  Neurologic: Alert and oriented x 3.  Psych: Normal affect. Extremities: No clubbing or cyanosis.  HEENT: Normal.   Assessment/Plan: 1. Chronic systolic CHF: Nonischemic cardiomyopathy by cath in 2014.  Over the years, EF has ranged 35-45%, echo in 9/20 with EF 40-45%. Most recent echo at [facility] was reported to have EF 45-50%. RHC in 3/21 showed normal filling pressures suggesting good  diuresis and preserved cardiac output.  On exam today, he does not look volume overloaded.  Improved symptoms, NYHA class II-III.  Now seems more limited by balance than dyspnea. - I was planning to start him on Entresto with low EF, but will hold off for now with recent falls, concern for orthostasis.    - Continue Toprol XL 12.5 mg bid.  - Continue torsemide 40 mg daily, BMET today.  - Continue spironolactone 25 mg daily.   - Will workup for cardiac amyloidosis.  He does not have a low voltage ECG but has moderate LVH and possible autonomic neuropathy with orthostasis. Myeloma panel and urine immunofixation negative.  Will arrange for PYP scan. He does not think he can do MRI even with sedation due to PTSD.  2. CKD: Stage 3.  Follow creatinine closely with diuresis, BMET today.  3. Suspect OSA: home sleep study.  4. Diabetes:  - He would be a good candidate for dapagliflozin in the future.  - He should ideally be on a statin, he agrees to start Crestor 5 mg daily with lipids/LFTs in 2 months.  5. Ascending aortic aneurysm: 6/19 CTA showed 4.9 cm ascending aorta.  - Repeat CTA chest.   6. Atrial fibrillation: Paroxysmal.  Noted in the hospital when he had SAH.  He is in NSR today. Anticoagulation was not started due to frequent falls and SAH.  - He saw neurosurgeon recently, ok'd for ASA 81 but not anticoagulation yet.  He will start ASA 81 daily.   Signed, Marca Ancona, MD  07/08/2019  [facility] [place] [address] [facility] [place] [address] [phone] (office) [phone] (fax)

## 2019-07-13 ENCOUNTER — Other Ambulatory Visit (HOSPITAL_COMMUNITY): Payer: Medicare Other

## 2019-07-16 ENCOUNTER — Ambulatory Visit: Payer: Medicare Other | Admitting: Physician Assistant

## 2019-07-17 ENCOUNTER — Other Ambulatory Visit: Payer: Self-pay

## 2019-07-17 ENCOUNTER — Ambulatory Visit (INDEPENDENT_AMBULATORY_CARE_PROVIDER_SITE_OTHER): Payer: Medicare Other | Admitting: Physician Assistant

## 2019-07-17 ENCOUNTER — Encounter: Payer: Self-pay | Admitting: Physician Assistant

## 2019-07-17 VITALS — BP 112/42 | HR 72 | Ht 70.0 in | Wt 230.0 lb

## 2019-07-17 DIAGNOSIS — K3184 Gastroparesis: Secondary | ICD-10-CM

## 2019-07-17 DIAGNOSIS — I712 Thoracic aortic aneurysm, without rupture, unspecified: Secondary | ICD-10-CM

## 2019-07-17 DIAGNOSIS — I5042 Chronic combined systolic (congestive) and diastolic (congestive) heart failure: Secondary | ICD-10-CM

## 2019-07-17 DIAGNOSIS — I480 Paroxysmal atrial fibrillation: Secondary | ICD-10-CM | POA: Diagnosis not present

## 2019-07-17 NOTE — Patient Instructions (Signed)
Medication Instructions:  Your physician recommends that you continue on your current medications as directed. Please refer to the Current Medication list given to you today.  *If you need a refill on your cardiac medications before your next appointment, please call your pharmacy*  Lab Work: None ordered today  Testing/Procedures: None ordered today  Follow-Up: At [facility], you and your health needs are our priority.  As part of our continuing mission to provide you with exceptional heart care, we have created designated Provider Care Teams.  These Care Teams include your primary Cardiologist (physician) and Advanced Practice Providers (APPs -  Physician Assistants and Nurse Practitioners) who all work together to provide you with the care you need, when you need it.  We recommend signing up for the patient portal called "MyChart".  Sign up information is provided on this After Visit Summary.  MyChart is used to connect with patients for Virtual Visits (Telemedicine).  Patients are able to view lab/test results, encounter notes, upcoming appointments, etc.  Non-urgent messages can be sent to your provider as well.   To learn more about what you can do with MyChart, go to [email or web address].    Your next appointment:   6 month(s)  The format for your next appointment:   In Person  Provider:   Michael Cooper, MD    

## 2019-07-17 NOTE — Progress Notes (Signed)
Cardiology Office Note:    Date:  07/17/2019   ID:  Alan [name], DOB 05/12/1940, MRN [id number]  PCP:  [facility]  Cardiologist:  Sherren Mocha, MD   Electrophysiologist:  None  [facility]:  Loralie Champagne, MD    Referring MD: [facility]   Chief Complaint:  Follow-up (CHF, AFib, aneurysm )    Patient Profile:    Alan [name] is a 79 y.o. male with:   Chronic combined systolic and diastolic CHF  Non-ischemic cardiomyopathy   Cath 2014: no significant CAD   Myoview 04/2016: no ischemia   EF 35-40 (2018)  EF 50-55 (Echocardiogram 07/2017)  Echo 01/14/2019: EF 40-45, GR 1 DD  Echo 05/16/2019 ([facility]): Moderate LVH, EF 45-50  Followed in CHF clinic; Dr. Aundra Dubin  PYP scan pending; myeloma panel and urine immunofixation negative; sleep study pending  Right heart cath 06/2019: Normal filling pressures  Paroxysmal atrial fibrillation  Noted during admission 04/2019 with subarachnoid hemorrhage; anticoagulation not started  Ascending thoracic aortic aneurysm (followed by Dr. Prescott Gum)  CT 6/19: 4.9 cm   CT 08/2018: 4.8 x 4.6 cm   Aortic insufficiency   Echo 12/2018: Moderate AI  Echo 04/2019: Mild to moderate AI, mild to moderate MR  Diabetes mellitus   Chronic kidney disease stage III  Hypertension   Prostate CA  Thyroid nodule  Status post fall 04/2019: Traumatic subarachnoid hemorrhage  Prior CV studies:  R cardiac catheterization 06/28/2019 Mean RA 1, RV 33/3, mean PA 22, PCWP mean 14; CO 5.51, CI 2.53 Normal filling pressures; preserved cardiac output  Echocardiogram 05/22/2019 ([facility]) Mod LVH, EF 45-50, mild to mod AI, mild to mod MR  Echocardiogram 01/10/2019 EF 40-45, diff HK, Gr 1 DD, normal RVSF, mild LAE, mod AI, GLS -11.1%  Chest/Abd/Pelvic CTA 08/2018 ([facility]) 4.8 x 4.6 cm TAA  Chest CTA 10/12/2017 Stable 4.9 cm ascending thoracic aortic aneurysm.   Echo  08/15/2017 Moderate LVH, EF 50-55, global HK, grade 1 diastolic dysfunction, mild AI, dilated ascending aorta (4.3 cm), MAC, mild MR, mild LAE  Echo 09/09/16 Moderate concentric LVH, EF 35-40,inferolateral and anterolateral AK, normal wall motion, moderate AI, moderate MR, moderate LAE, mild PI  Myoview 05/11/16 No ischemia, EF 33, high risk  Echo 7/16 Diff HK worse in inf base, EF 40%, Gr 1 DD, mild AI, mild MR, mod LAE  LHC 11/14 LM: minor irregularity. LAD: diffuse irregularities but no significant stenoses throughout the course of the LAD EF 35%.  History of Present Illness:    Mr. Nephew was last seen by me via telemedicine in May 2020.  Since that time, he had a follow-up echocardiogram with Dr. Burt Knack in September 2020 which demonstrated an EF of 40-45%.  He had worsening issues with heart failure and was eventually referred to Dr. Aundra Dubin in the heart failure clinic.  Patient has significant issues with balance and falls frequently.  Dr. Aundra Dubin had planned on placing him on Entresto.  However, the patient fell in January and broke his left radius as well as had a traumatic subarachnoid hemorrhage.  He was noted to be in atrial fibrillation at that time.  Anticoagulation was not started secondary to traumatic subarachnoid hemorrhage.  Dr. Aundra Dubin held off on [place] secondary to poor balance and frequent falls.  Most recently, he had a right heart cath that demonstrated normal filling pressures on current diuretic therapy.  He returns for follow up.  He uses a walker to ambulate.  He  notes chronic dyspnea on exertion without significant change.  His weights at home have been stable.  He lies prone at night.  He has not had significant leg swelling  He has not had syncope, chest pain.  He continues to have issues with insomnia and remains somnolent during the day.  He notes difficulty with some meals (steak, hot dogs, chili). He notes early satiety and occasional regurgitation.      Past Medical History:  Diagnosis Date  . Arthritis   . Chronic systolic CHF (congestive heart failure) ([place])   . Diabetes mellitus [facility] MED  . ED (erectile dysfunction)   . Glaucoma of right eye   . History of nuclear stress test    MV 1/18: EF 33, no ischemia; high risk  . History of prostate cancer   . Malfunction of artificial urethral sphincter ([place])   . Malfunction of penile prosthesis ([place])   . NICM (nonischemic cardiomyopathy) ([place]) 02/06/2015   LHC 11/14: EF 35, no significant CAD // Echo 7/16: Diff HK worse in inf base, EF 40%, Gr 1 DD, mild AI, mild MR, mod LAE // Echo 5/18: Moderate concentric LVH, EF 35-40, inferolateral and anterolateral AK, normal wall motion, moderate AI, moderate MR, moderate LAE, mild PI  . Nocturia   . SUI (stress urinary incontinence), male   . Thoracic aortic aneurysm without rupture ([place]) 02/06/2015   Chest CTA 1/18:  5.1 cm ascending aortic aneurysm, mild cardiomegaly, mild coronary artery calcification, upper lobe small airway disease, cholelithiasis, 16 mm right thyroid nodule    Current Medications: Current Meds  Medication Sig  . Alogliptin Benzoate 25 MG TABS Take 12.5 mg by mouth daily.  Marland Kitchen aspirin EC 81 MG tablet Take 1 tablet (81 mg total) by mouth daily.  . brimonidine (ALPHAGAN) 0.2 % ophthalmic solution Place 1 drop into the right eye 3 (three) times daily.   . calcium carbonate (TUMS - DOSED IN MG ELEMENTAL CALCIUM) 500 MG chewable tablet Chew 1 tablet by mouth daily as needed for indigestion or heartburn.  . Cholecalciferol (VITAMIN D) 2000 UNITS CAPS Take 2,000 Units by mouth daily.   . diphenhydrAMINE-zinc acetate (BENADRYL) cream Apply 1 application topically 3 (three) times daily as needed for itching.  . docusate sodium (COLACE) 100 MG capsule Take 100 mg by mouth 2 (two) times daily.  . dorzolamide-timolol (COSOPT) 22.3-6.8 MG/ML ophthalmic solution Place 1 drop into the right eye 2 (two) times daily.   Marland Kitchen glipiZIDE  (GLUCOTROL) 10 MG tablet Take 10 mg by mouth 2 (two) times daily before a meal.  . ibuprofen (ADVIL) 200 MG tablet Take 200 mg by mouth daily as needed for headache or moderate pain.  Marland Kitchen LANTUS SOLOSTAR 100 UNIT/ML Solostar Pen Inject 15 Units into the skin at bedtime.  Marland Kitchen latanoprost (XALATAN) 0.005 % ophthalmic solution Place 1 drop into the left eye at bedtime.   . metFORMIN (GLUCOPHAGE) 500 MG tablet Take 500 mg by mouth 2 (two) times daily with a meal.  . metoprolol succinate (TOPROL-XL) 25 MG 24 hr tablet Take 12.5 mg by mouth 2 (two) times daily.  . Netarsudil-Latanoprost (ROCKLATAN) 0.02-0.005 % SOLN Place 1 drop into the right eye at bedtime.  . polyethylene glycol (MIRALAX / GLYCOLAX) 17 g packet Take 17 g by mouth daily as needed for moderate constipation.  . rosuvastatin (CRESTOR) 5 MG tablet Take 1 tablet (5 mg total) by mouth at bedtime.  . sodium chloride (OCEAN) 0.65 % SOLN nasal spray Place 1 spray into  both nostrils as needed for congestion.  Marland Kitchen spironolactone (ALDACTONE) 25 MG tablet Take 1 tablet (25 mg total) by mouth daily.  Marland Kitchen torsemide (DEMADEX) 20 MG tablet Take 40 mg by mouth daily.  . traMADol (ULTRAM) 50 MG tablet Take 50 mg by mouth every 8 (eight) hours as needed.  . vitamin B-12 (CYANOCOBALAMIN) 1000 MCG tablet Take 1,000 mcg by mouth in the morning and at bedtime.     Allergies:   Ezetimibe, Statins, Lacosamide, Other, Pravastatin, and Ropinirole   Social History   Tobacco Use  . Smoking status: Never Smoker  . Smokeless tobacco: Never Used  Substance Use Topics  . Alcohol use: No  . Drug use: No     Family Hx: The patient's family history includes Healthy in his sister and sister; Heart Problems in his mother; Heart attack in his father.  ROS   EKGs/Labs/Other Test Reviewed:    EKG:  EKG is   ordered today.  The ekg ordered today demonstrates normal sinus rhythm, HR 72, LAFB, RBBB, 1st degree AVB, QTc 519, no changes  Recent Labs: 04/18/2019: NT-Pro  BNP 975 05/09/2019: B Natriuretic Peptide 352.7 06/20/2019: Platelets 302 06/28/2019: Hemoglobin 11.6; Hemoglobin 11.2 07/05/2019: BUN 24; Creatinine, Ser 1.30; Potassium 4.4; Sodium 136   Recent Lipid Panel Lab Results  Component Value Date/Time   CHOL 166 09/09/2016 04:06 AM   TRIG 157 (H) 09/09/2016 04:06 AM   HDL 35 (L) 09/09/2016 04:06 AM   CHOLHDL 4.7 09/09/2016 04:06 AM   LDLCALC 100 (H) 09/09/2016 04:06 AM    Physical Exam:    VS:  BP (!) 112/42   Pulse 72   Ht _0  (1.778 m)   Wt 230 lb (104.3 kg)   SpO2 97%   BMI 33.00 kg/m     Wt Readings from Last 3 Encounters:  07/17/19 230 lb (104.3 kg)  07/05/19 228 lb 9.6 oz (103.7 kg)  06/28/19 220 lb (99.8 kg)     Constitutional:      Appearance: Not in distress.  Pulmonary:     Breath sounds: No wheezing. No rales.  Cardiovascular:     Normal rate. Regular rhythm. Normal S1. Normal S2.     Murmurs: There is a grade 2/6 systolic murmur at the LLSB.  Edema:    Peripheral edema absent.  Abdominal:     Palpations: Abdomen is soft.  Musculoskeletal:     Cervical back: Neck supple. Skin:    General: Skin is warm and dry.  Neurological:     General: No focal deficit present.     Mental Status: Alert and oriented to person, place and time.      ASSESSMENT & PLAN:    1. Chronic combined systolic and diastolic CHF (congestive heart failure) (HCC) EF 45-50 by most recent echocardiogram.  He is NYHA III.  His functional limitation seems to be mainly related to deconditioning and balance issues.  He notes a long hx of insomnia and daytime somnolence.  He has been followed by Dr. Aundra Dubin at the [facility] clinic.  PYP scan is pending.  The patient would like to cancel this but I have encouraged him to get the test to make sure he does not have amyloid.  His BP will not tolerate further increases in his medications.  His volume seems to be stable.  RHC 2 weeks ago demonstrated normal filling pressures.  Continue current management.   Follow up with Dr. Aundra Dubin in 08/2019 and follow up with Dr. Burt Knack in  6 mos.   2. Thoracic aortic aneurysm without rupture (HCC) 4.8 cm by most recent CT.  BP is controlled.  He is followed by Dr. Prescott Gum with [facility].   3. Paroxysmal atrial fibrillation (HCC) Maintaining normal sinus rhythm.  He is currently not a candidate for anticoagulation due to prior subarachnoid hemorrhage.    4. Gastroparesis He describes symptoms of gastroparesis which is likely due to his diabetes mellitus.  First, I have cautioned him to avoid the foods listed above (chili, hot dogs, etc) as the sodium content in these foods will contribute to further volume overload.  Second, I have asked him to follow up with his PCP to discuss management for gastroparesis.     Dispo:  Return in about 6 months (around 01/17/2020) for Routine Follow Up, w/ Dr. Burt Knack, in person.   Medication Adjustments/Labs and Tests Ordered: Current medicines are reviewed at length with the patient today.  Concerns regarding medicines are outlined above.  Tests Ordered: Orders Placed This Encounter  Procedures  . EKG 12-Lead   Medication Changes: No orders of the defined types were placed in this encounter.   Signed, Richardson Dopp, PA-C  07/17/2019 3:47 PM    [facility] [place], [place], [place]  [address] Phone: [phone]; Fax: [phone]

## 2019-07-19 ENCOUNTER — Ambulatory Visit (HOSPITAL_COMMUNITY)
Admission: RE | Admit: 2019-07-19 | Discharge: 2019-07-19 | Disposition: A | Discharge status: Home / Self Care | Payer: Medicare Other | Source: Ambulatory Visit | Attending: Cardiology | Admitting: Cardiology

## 2019-07-19 ENCOUNTER — Encounter (HOSPITAL_COMMUNITY)
Admission: RE | Admit: 2019-07-19 | Discharge: 2019-07-19 | Disposition: A | Discharge status: Home / Self Care | Payer: Medicare Other | Source: Ambulatory Visit | Attending: Cardiology | Admitting: Cardiology

## 2019-07-19 ENCOUNTER — Other Ambulatory Visit: Payer: Self-pay

## 2019-07-19 DIAGNOSIS — I5022 Chronic systolic (congestive) heart failure: Secondary | ICD-10-CM | POA: Insufficient documentation

## 2019-07-19 MED ORDER — TECHNETIUM TC 99M PYROPHOSPHATE
20.5000 | Freq: Once | INTRAVENOUS | Status: AC | PRN
  Administered 2019-07-19: 20.5 via INTRAVENOUS
  Filled 2019-07-19: qty 21

## 2019-07-26 ENCOUNTER — Emergency Department (HOSPITAL_BASED_OUTPATIENT_CLINIC_OR_DEPARTMENT_OTHER): Payer: Medicare Other

## 2019-07-26 ENCOUNTER — Encounter (HOSPITAL_BASED_OUTPATIENT_CLINIC_OR_DEPARTMENT_OTHER): Payer: Self-pay | Admitting: Emergency Medicine

## 2019-07-26 ENCOUNTER — Emergency Department (HOSPITAL_BASED_OUTPATIENT_CLINIC_OR_DEPARTMENT_OTHER)
Admission: EM | Admit: 2019-07-26 | Discharge: 2019-07-26 | Disposition: A | Discharge status: Home / Self Care | Payer: Medicare Other | Attending: Emergency Medicine | Admitting: Emergency Medicine

## 2019-07-26 ENCOUNTER — Other Ambulatory Visit: Payer: Self-pay

## 2019-07-26 DIAGNOSIS — E119 Type 2 diabetes mellitus without complications: Secondary | ICD-10-CM | POA: Diagnosis not present

## 2019-07-26 DIAGNOSIS — W19XXXA Unspecified fall, initial encounter: Secondary | ICD-10-CM | POA: Diagnosis not present

## 2019-07-26 DIAGNOSIS — I110 Hypertensive heart disease with heart failure: Secondary | ICD-10-CM | POA: Diagnosis not present

## 2019-07-26 DIAGNOSIS — I5022 Chronic systolic (congestive) heart failure: Secondary | ICD-10-CM | POA: Diagnosis not present

## 2019-07-26 DIAGNOSIS — S79911A Unspecified injury of right hip, initial encounter: Secondary | ICD-10-CM | POA: Diagnosis present

## 2019-07-26 DIAGNOSIS — Z7982 Long term (current) use of aspirin: Secondary | ICD-10-CM | POA: Diagnosis not present

## 2019-07-26 DIAGNOSIS — Y92009 Unspecified place in unspecified non-institutional (private) residence as the place of occurrence of the external cause: Secondary | ICD-10-CM

## 2019-07-26 DIAGNOSIS — S7001XA Contusion of right hip, initial encounter: Secondary | ICD-10-CM | POA: Diagnosis not present

## 2019-07-26 DIAGNOSIS — Z79899 Other long term (current) drug therapy: Secondary | ICD-10-CM | POA: Insufficient documentation

## 2019-07-26 DIAGNOSIS — Y939 Activity, unspecified: Secondary | ICD-10-CM | POA: Diagnosis not present

## 2019-07-26 DIAGNOSIS — Y999 Unspecified external cause status: Secondary | ICD-10-CM | POA: Insufficient documentation

## 2019-07-26 DIAGNOSIS — Z7984 Long term (current) use of oral hypoglycemic drugs: Secondary | ICD-10-CM | POA: Insufficient documentation

## 2019-07-26 DIAGNOSIS — Y929 Unspecified place or not applicable: Secondary | ICD-10-CM | POA: Insufficient documentation

## 2019-07-26 DIAGNOSIS — R39192 Position dependent micturition: Secondary | ICD-10-CM | POA: Insufficient documentation

## 2019-07-26 MED ORDER — HYDROCODONE-ACETAMINOPHEN 5-325 MG PO TABS: 1.0000 | ORAL_TABLET | ORAL | 0 refills | Status: DC | PRN

## 2019-07-26 MED ORDER — HYDROCODONE-ACETAMINOPHEN 5-325 MG PO TABS
1.0000 | ORAL_TABLET | Freq: Once | ORAL | Status: AC
  Administered 2019-07-26: 1 via ORAL
  Filled 2019-07-26: qty 1

## 2019-07-26 MED FILL — HYDROCODON-APAP 5-325: 5-325 | 3 days supply | Qty: 20 | Fill #0

## 2019-07-26 NOTE — ED Triage Notes (Signed)
Patient presents via EMS from home with complaints of right hip pain. States fell from standing position 5 days ago. States since then patient has had increased difficulty with urinating due to inability to stand.

## 2019-07-26 NOTE — Discharge Instructions (Signed)
Apply ice as needed.  Return if pain is not being adequately controlled at home.

## 2019-07-26 NOTE — ED Provider Notes (Signed)
[facility] EMERGENCY DEPARTMENT Provider Note   CSN: [id number] Arrival date & time: 07/26/19  0354   History Chief Complaint  Patient presents with  . Hip Pain    Alan [name] is a 79 y.o. male.  The history is provided by the patient and the spouse.  Hip Pain  He has history of hypertension, diabetes, hyperlipidemia, chronic systolic heart failure and comes in because of inability to stand and inability to urinate.  He had fallen 5 days ago, landing on his right side.  He was doing well until yesterday when he was unable to bear weight on his right leg.  He is unable to urinate unless he stands, so he has been unable to urinate.  He denies head or neck injury or back injury.  He had been ambulatory for 3 days following the fall.  His wife noted a bruise on his right hip.  He is not on any anticoagulants and the only antiplatelet agent he takes is low-dose aspirin.  Past Medical History:  Diagnosis Date  . Arthritis   . Chronic systolic CHF (congestive heart failure) ([place])   . Diabetes mellitus ORAL MED  . ED (erectile dysfunction)   . Glaucoma of right eye   . History of nuclear stress test    MV 1/18: EF 33, no ischemia; high risk  . History of prostate cancer   . Malfunction of artificial urethral sphincter ([place])   . Malfunction of penile prosthesis ([place])   . NICM (nonischemic cardiomyopathy) ([place]) 02/06/2015   LHC 11/14: EF 35, no significant CAD // Echo 7/16: Diff HK worse in inf base, EF 40%, Gr 1 DD, mild AI, mild MR, mod LAE // Echo 5/18: Moderate concentric LVH, EF 35-40, inferolateral and anterolateral AK, normal wall motion, moderate AI, moderate MR, moderate LAE, mild PI  . Nocturia   . SUI (stress urinary incontinence), male   . Thoracic aortic aneurysm without rupture ([place]) 02/06/2015   Chest CTA 1/18:  5.1 cm ascending aortic aneurysm, mild cardiomegaly, mild coronary artery calcification, upper lobe small airway disease, cholelithiasis, 16 mm  right thyroid nodule    Patient Active Problem List   Diagnosis Date Noted  . Essential hypertension 11/23/2016  . Chronic systolic CHF (congestive heart failure) ([place]) 09/11/2016  . Uncontrolled type 2 diabetes mellitus with hyperglycemia, without long-term current use of insulin ([place]) 09/11/2016  . Esophageal dysmotility 09/11/2016  . Thyroid nodule 09/08/2016  . Chest pressure 05/10/2016  . Insomnia 05/10/2016  . NICM (nonischemic cardiomyopathy) ([place]) 02/06/2015  . Thoracic aortic aneurysm without rupture ([place]) 02/06/2015  . Aortic insufficiency 02/06/2015  . Hyperlipidemia 02/06/2015  . 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 Malfunction of penile prosthesis ([place]) 09/24/2011    Past Surgical History:  Procedure Laterality Date  . I & D/ ORIF RIGHT INDEX FINGER  01-19-2005  . INSERTION INFLATABLE PENILE PROSTHESIS  06-22-1999   MENTOR ALPHA I  . IR GENERIC HISTORICAL  06/17/2016   IR RADIOLOGIST EVAL & MGMT 06/17/2016 Marybelle Killings, MD [facility] INTERV RAD  . LEFT HEART CATHETERIZATION WITH CORONARY ANGIOGRAM N/A 03/15/2013   Procedure: LEFT HEART CATHETERIZATION WITH CORONARY ANGIOGRAM;  Surgeon: Blane Ohara, MD;  Location: [facility] CATH LAB;  Service: Cardiovascular;  Laterality: N/A;  . PENILE PROSTHESIS IMPLANT  09/24/2011   Procedure: PENILE PROTHESIS INFLATABLE;  Surgeon: Claybon Jabs, MD;  Location: [facility];  Service: Urology;  Laterality: N/A;  REPLACEMENT OF INFLATABLE PENILE PROTHESIS ([place])    .  PLACEMENT ARTIFICIAL URINARY SPHINTER  01-20-2001   [id number]  . REMOVAL AND REPLACEMENT GU SPHINTER CUFF  05-15-2004   STRESS URINARY INCONTINENCE  . RIGHT HEART CATH N/A 06/28/2019   Procedure: RIGHT HEART CATH;  Surgeon: Laurey Morale, MD;  Location: [facility] INVASIVE CV LAB;  Service: Cardiovascular;  Laterality: N/A;       Family History  Problem Relation Age of Onset  . Heart Problems Mother   . Heart attack Father   . Healthy Sister         NO HEART ISSUES  . Healthy Sister        NO HEART ISSUES    Social History   Tobacco Use  . Smoking status: Never Smoker  . Smokeless tobacco: Never Used  Substance Use Topics  . Alcohol use: No  . Drug use: No    Home Medications Prior to Admission medications   Medication Sig Start Date End Date Taking? Authorizing Provider  Alogliptin Benzoate 25 MG TABS Take 12.5 mg by mouth daily.    [provider]  aspirin EC 81 MG tablet Take 1 tablet (81 mg total) by mouth daily. 07/05/19   Laurey Morale, MD  brimonidine (ALPHAGAN) 0.2 % ophthalmic solution Place 1 drop into the right eye 3 (three) times daily.  03/21/18   [provider]  calcium carbonate (TUMS - DOSED IN MG ELEMENTAL CALCIUM) 500 MG chewable tablet Chew 1 tablet by mouth daily as needed for indigestion or heartburn.    [provider]  Cholecalciferol (VITAMIN D) 2000 UNITS CAPS Take 2,000 Units by mouth daily.     [provider]  diphenhydrAMINE-zinc acetate (BENADRYL) cream Apply 1 application topically 3 (three) times daily as needed for itching.    [provider]  docusate sodium (COLACE) 100 MG capsule Take 100 mg by mouth 2 (two) times daily.    [provider]  dorzolamide-timolol (COSOPT) 22.3-6.8 MG/ML ophthalmic solution Place 1 drop into the right eye 2 (two) times daily.  11/24/15   [provider]  glipiZIDE (GLUCOTROL) 10 MG tablet Take 10 mg by mouth 2 (two) times daily before a meal.    [provider]  ibuprofen (ADVIL) 200 MG tablet Take 200 mg by mouth daily as needed for headache or moderate pain.    [provider]  LANTUS SOLOSTAR 100 UNIT/ML Solostar Pen Inject 15 Units into the skin at bedtime. 05/25/19   [provider]  latanoprost (XALATAN) 0.005 % ophthalmic solution Place 1 drop into the left eye at bedtime.     [provider]  metFORMIN (GLUCOPHAGE) 500 MG tablet Take 500 mg by mouth 2 (two)  times daily with a meal.    [provider]  metoprolol succinate (TOPROL-XL) 25 MG 24 hr tablet Take 12.5 mg by mouth 2 (two) times daily.    [provider]  Netarsudil-Latanoprost (ROCKLATAN) 0.02-0.005 % SOLN Place 1 drop into the right eye at bedtime.    [provider]  polyethylene glycol (MIRALAX / GLYCOLAX) 17 g packet Take 17 g by mouth daily as needed for moderate constipation.    [provider]  rosuvastatin (CRESTOR) 5 MG tablet Take 1 tablet (5 mg total) by mouth at bedtime. 07/05/19 07/04/20  Laurey Morale, MD  sodium chloride (OCEAN) 0.65 % SOLN nasal spray Place 1 spray into both nostrils as needed for congestion.    [provider]  spironolactone (ALDACTONE) 25 MG tablet Take 1 tablet (25 mg  total) by mouth daily. 09/12/16   Dhungel, Nishant, MD  torsemide (DEMADEX) 20 MG tablet Take 40 mg by mouth daily.    [provider]  traMADol (ULTRAM) 50 MG tablet Take 50 mg by mouth every 8 (eight) hours as needed.    [provider]  vitamin B-12 (CYANOCOBALAMIN) 1000 MCG tablet Take 1,000 mcg by mouth in the morning and at bedtime.    [provider]    Allergies    Ezetimibe, Statins, Lacosamide, Other, Pravastatin, and Ropinirole  Review of Systems   Review of Systems  All other systems reviewed and are negative.   Physical Exam Updated Vital Signs BP 137/65 (BP Location: Right Arm)   Pulse 84   Temp 99.2 F (37.3 C) (Oral)   Resp 18   Ht 5\' 10"  (1.778 m)   Wt 104 kg   SpO2 97%   BMI 32.90 kg/m   Physical Exam Vitals and nursing note reviewed.   79 year old male, resting comfortably and in no acute distress. Vital signs are normal. Oxygen saturation is 97%, which is normal. Head is normocephalic and atraumatic. PERRLA, EOMI. Oropharynx is clear. Neck is nontender and supple without adenopathy or JVD. Back is nontender and there is no CVA tenderness. Lungs are clear without rales, wheezes,  or rhonchi. Chest is nontender. Heart has regular rate and rhythm without murmur. Abdomen is soft, flat, nontender without masses or hepatosplenomegaly and peristalsis is normoactive. Extremities: Ecchymosis is present on the posterior lateral aspect of the right hip.  There is marked tenderness on the posterior and lateral aspects of the right hip.  Full range of motion of the right hip is present, but he does complain of pain with passive range of motion.  There is no peripheral edema.  Full range of motion present all other joints without pain.. Skin is warm and dry without rash. Neurologic: Mental status is normal, cranial nerves are intact, there are no motor or sensory deficits.  ED Results / Procedures / Treatments   Labs (all labs ordered are listed, but only abnormal results are displayed) Labs Reviewed - No data to display  EKG None  Radiology CT Hip Right Wo Contrast  Result Date: 07/26/2019 CLINICAL DATA:  Hip trauma with fracture suspected. Fall 5 days ago with severe right hip pain. EXAM: CT OF THE RIGHT HIP WITHOUT CONTRAST TECHNIQUE: Multidetector CT imaging of the right hip was performed according to the standard protocol. Multiplanar CT image reconstructions were also generated. COMPARISON:  Radiography from earlier today FINDINGS: Bones/Joint/Cartilage No malalignment or evidence of hip fracture. Negative for bone lesion. Marginal spurring and enthesophytes. Ligaments Suboptimally assessed by CT. Muscles and Tendons No evidence of major muscular or ligamentous disruption Soft tissues Fatty right inguinal hernia. Pelvic lymphadenectomy and prostatectomy with prosthetic urethra. IMPRESSION: 1. Negative for hip fracture. 2. Symmetric degenerative hip spurring. Electronically Signed   By: 09/25/2019 M.D.   On: 07/26/2019 06:28   DG Hip Unilat W or Wo Pelvis 2-3 Views Right  Result Date: 07/26/2019 CLINICAL DATA:  Fall 5 days ago with abrasion lateral to the right hip. The  patient was unable to walk tonight. EXAM: DG HIP (WITH OR WITHOUT PELVIS) 2-3V RIGHT COMPARISON:  None. FINDINGS: There is no evidence of hip fracture or dislocation. There is no evidence of arthropathy or other focal bone abnormality. Changes of prostatectomy and pelvic lymphadenectomy. No sclerotic bone lesions. Arterial calcifications. IMPRESSION: Negative. Electronically Signed   By: 09/25/2019.D.  On: 07/26/2019 04:41    Procedures Procedures   Medications Ordered in ED Medications - No data to display  ED Course  I have reviewed the triage vital signs and the nursing notes.  Pertinent labs & imaging results that were available during my care of the patient were reviewed by me and considered in my medical decision making (see chart for details).  MDM Rules/Calculators/A&P Fall with injury to right hip.  X-ray has been ordered.  We will also check bladder scan.  Anticipate need for Foley catheter since he is unable to urinate without standing and is unable to stand.  Old records are reviewed, and he is followed by neurology because of frequent falls, and had been admitted last January for a fall with left wrist fracture and subarachnoid hemorrhage.  5:53 AM Bladder scan did not show a large volume of urine.  Hip x-rays show no evidence of fracture.  Patient was able to stand and urinate, but with severe pain.  He will be sent for CT scan to look for occult fracture.  6:46 AM CT shows no evidence of fracture or other acute injury.  He is given a dose of hydrocodone-acetaminophen for pain.  At home, he has been using tramadol without relief.  His record on the [place] controlled substance reporting website was reviewed, and he has been getting tramadol prescriptions since his wrist injury in January.  He is referred to orthopedics for follow-up.  Advised to return if not getting adequate pain relief at home.  Final Clinical Impression(s) / ED Diagnoses Final diagnoses:  Fall  at home, initial encounter  Contusion of right hip, initial encounter    Rx / DC Orders ED Discharge Orders         Ordered    HYDROcodone-acetaminophen (NORCO) 5-325 MG tablet  Every 4 hours PRN     07/26/19 0644           Dione Booze, MD 07/26/19 [phone]

## 2019-07-30 ENCOUNTER — Ambulatory Visit: Payer: Medicare Other | Admitting: Family Medicine

## 2019-09-07 ENCOUNTER — Other Ambulatory Visit: Payer: Self-pay

## 2019-09-07 ENCOUNTER — Encounter (HOSPITAL_COMMUNITY): Payer: Self-pay | Admitting: Cardiology

## 2019-09-07 ENCOUNTER — Ambulatory Visit (HOSPITAL_COMMUNITY)
Admission: RE | Admit: 2019-09-07 | Discharge: 2019-09-07 | Disposition: A | Discharge status: Home / Self Care | Payer: Medicare Other | Source: Ambulatory Visit | Attending: Cardiology | Admitting: Cardiology

## 2019-09-07 VITALS — BP 106/38 | HR 62 | Wt 224.6 lb

## 2019-09-07 DIAGNOSIS — Z79899 Other long term (current) drug therapy: Secondary | ICD-10-CM | POA: Diagnosis not present

## 2019-09-07 DIAGNOSIS — I7121 Aneurysm of the ascending aorta, without rupture: Secondary | ICD-10-CM

## 2019-09-07 DIAGNOSIS — I4510 Unspecified right bundle-branch block: Secondary | ICD-10-CM | POA: Insufficient documentation

## 2019-09-07 DIAGNOSIS — N183 Chronic kidney disease, stage 3 unspecified: Secondary | ICD-10-CM | POA: Diagnosis not present

## 2019-09-07 DIAGNOSIS — E1122 Type 2 diabetes mellitus with diabetic chronic kidney disease: Secondary | ICD-10-CM | POA: Insufficient documentation

## 2019-09-07 DIAGNOSIS — Z7982 Long term (current) use of aspirin: Secondary | ICD-10-CM | POA: Diagnosis not present

## 2019-09-07 DIAGNOSIS — I480 Paroxysmal atrial fibrillation: Secondary | ICD-10-CM

## 2019-09-07 DIAGNOSIS — I428 Other cardiomyopathies: Secondary | ICD-10-CM | POA: Insufficient documentation

## 2019-09-07 DIAGNOSIS — R296 Repeated falls: Secondary | ICD-10-CM | POA: Diagnosis not present

## 2019-09-07 DIAGNOSIS — Z794 Long term (current) use of insulin: Secondary | ICD-10-CM | POA: Insufficient documentation

## 2019-09-07 DIAGNOSIS — E7849 Other hyperlipidemia: Secondary | ICD-10-CM | POA: Diagnosis not present

## 2019-09-07 DIAGNOSIS — Z8249 Family history of ischemic heart disease and other diseases of the circulatory system: Secondary | ICD-10-CM | POA: Diagnosis not present

## 2019-09-07 DIAGNOSIS — I5022 Chronic systolic (congestive) heart failure: Secondary | ICD-10-CM

## 2019-09-07 DIAGNOSIS — I712 Thoracic aortic aneurysm, without rupture: Secondary | ICD-10-CM

## 2019-09-07 DIAGNOSIS — F4310 Post-traumatic stress disorder, unspecified: Secondary | ICD-10-CM | POA: Insufficient documentation

## 2019-09-07 DIAGNOSIS — I440 Atrioventricular block, first degree: Secondary | ICD-10-CM | POA: Insufficient documentation

## 2019-09-07 LAB — HEPATIC FUNCTION PANEL
ALT: 14 U/L (ref 0–44)
AST: 15 U/L (ref 15–41)
Albumin: 3.6 g/dL (ref 3.5–5.0)
Alkaline Phosphatase: 63 U/L (ref 38–126)
Bilirubin, Direct: <0.1 mg/dL (ref 0.0–0.2)
Total Bilirubin: 0.9 mg/dL (ref 0.3–1.2)
Total Protein: 6.7 g/dL (ref 6.5–8.1)

## 2019-09-07 LAB — BASIC METABOLIC PANEL
Anion gap: 10 (ref 5–15)
BUN: 21 mg/dL (ref 8–23)
CO2: 24 mmol/L (ref 22–32)
Calcium: 9.3 mg/dL (ref 8.9–10.3)
Chloride: 103 mmol/L (ref 98–111)
Creatinine, Ser: 1.26 mg/dL — ABNORMAL HIGH (ref 0.61–1.24)
GFR calc Af Amer: >60 mL/min (ref >60)
GFR calc non Af Amer: 55 mL/min — ABNORMAL LOW (ref >60)
Glucose, Bld: 217 mg/dL — ABNORMAL HIGH (ref 70–99)
Potassium: 4.4 mmol/L (ref 3.5–5.1)
Sodium: 137 mmol/L (ref 135–145)

## 2019-09-07 NOTE — Patient Instructions (Addendum)
Please use at home sleep study.  Labs today We will only contact you if something comes back abnormal or we need to make some changes. Otherwise no news is good news!  You were ordered for a CTA of the chest.  Non-Cardiac CT Angiography (CTA), is a special type of CT scan that uses a computer to produce multi-dimensional views of major blood vessels throughout the body. In CT angiography, a contrast material is injected through an IV to help visualize the blood vessels   Your physician recommends that you schedule a follow-up appointment in: 4 months with Dr Shirlee Latch   NEXT APPOINTMENT: Tuesday September 14th, 2021 at 1:40pm Garage code [id number]  Please call office at [phone] option [phone] if you have any questions or concerns.   At the [facility], you and your health needs are our priority. As part of our continuing mission to provide you with exceptional heart care, we have created designated Provider Care Teams. These Care Teams include your primary Cardiologist (physician) and Advanced Practice Providers (APPs- Physician Assistants and Nurse Practitioners) who all work together to provide you with the care you need, when you need it.   You may see any of the following providers on your designated Care Team at your next follow up: Marland Kitchen Dr Arvilla Meres . Dr Marca Ancona . Tonye Becket, NP . Robbie Lis, PA . Karle Plumber, PharmD   Please be sure to bring in all your medications bottles to every appointment.

## 2019-09-09 NOTE — Progress Notes (Signed)
Date:  06/05/2019   ID:  Bebe Liter, DOB Nov 18, 1940, MRN [id number]   Provider location: [facility] Type of Visit: Established patient   PCP:  [facility]  Cardiologist:  Sherren Mocha, MD Primary HF: Dr. Aundra Dubin   History of Present Illness: Alan [name] is a 79 y.o. male who has a history of diabetes, CKD stage 3, thoracic aortic aneurysm, and chronic systolic CHF.  Patient has a long history of CHF, diagnosed around 2014.  Cath in 2014 showed no significant CAD.  Over the years, his EF has ranged from 35-45%, never markedly low.  Echo from 9/20 showed EF 40-45% with normal RV function.    For several years, he has had gradually worsening exertional dyspnea and decreased exercise tolerance.  He sleeps very poor due to orthopnea and PND.  He is very sleepy during the day.  He is short of breath walking around the house, walking to the mailbox, and walking up any incline.  No chest pain.   He has had a history of frequent falls, his girlfriend says that he seems to fall asleep standing at times and will fall (does not pass out).  In 1/21, he fell and hit his head.  He was found to have a traumatic subarachnoid hemorrhage and a left radial fracture.  Aspirin was stopped.  He was noted to be in atrial fibrillation during this admission.  He was not anticoagulated due to falls and SAH.  Echo at [facility] in 1/21 showed EF 45-50% with severe LV dilation, moderate LVH, mild-moderate MR, mild-moderate AI.   Patient had [place] in 3/21, showing optimized filling pressures and normal cardiac index.  PYP scan in 3/21 was not suggestive of TTR amyloidosis.   Patient returns for followup of CHF.  He is symptomatically stable.  He is having less falls now, fell once in 07/26/19 (mechanical) with no fractures.  He continues to fatigue easily.  No dyspnea walking around his house.  He gets short of breath walking in the grocery store.  Mild orthopnea.  No chest pain.  Weight is down 4 lbs.   Labs (12/20): NT-proBNP 975, hgb 13, K 4.9, creatinine 1.6 Labs (1/21): K 4.3, creatinine 1.5, BNP 353 Labs (2/21): K 4.4, creatinine 1.35 => 1.29, hgb 11.5, myeloma panel negative, urine immunofixation negative Labs (3/21): K 4.4, creatinine 1.3  ECG (personally reviewed): NSR, 1st degree AVB, RBBB  PMH: 1. Type II diabetes with gastroparesis 2. Thoracic aortic aneurysm: - CTA chest 1/18 with 5.1 cm ascending aorta.  - CTA chest 6/19 with 4.9 cm ascending aorta 3. Chronic systolic CHF: Nonischemic cardiomyopathy.  - LHC (11/14): No significant CAD.  - Echo (7/16): EF 40% - Echo (5/18): EF 35-40% - Echo (9/20): EF 40-45%, normal RV.  - Echo (1/21, [facility]): EF 45-50%, moderate LVH, mild-moderate MR, mild-moderate AI.   - RHC (3/21): mean RA 1, PA 39/7, mean PCWP 14, CI 2.53 - PYP scan (3/21): grade 2, H/CL 1.2 => unlikely to be TTR amyloidosis.  4. CKD: Stage 3 5. Traumatic subarachnoid hemorrhage (12/20).  6. Atrial fibrillation: Paroxysmal.  7. C-spine arthritis 8. PTSD   Social History   Socioeconomic History  . Marital status: Divorced    Spouse name: Not on file  . Number of children: Not on file  . Years of education: Not on file  . Highest education level: Not on file  Occupational History  . Not on file  Tobacco Use  . Smoking  status: Never Smoker  . Smokeless tobacco: Never Used  Substance and Sexual Activity  . Alcohol use: No  . Drug use: No  . Sexual activity: Not on file  Other Topics Concern  . Not on file  Social History Narrative  . Not on file   Social Determinants of Health   Financial Resource Strain:   . Difficulty of Paying Living Expenses:   Food Insecurity:   . Worried About Programme researcher, broadcasting/film/video in the Last Year:   . Barista in the Last Year:   Transportation Needs:   . Freight forwarder (Medical):   Marland Kitchen Lack of Transportation (Non-Medical):   Physical Activity:   . Days of Exercise per Week:   .  Minutes of Exercise per Session:   Stress:   . Feeling of Stress :   Social Connections:   . Frequency of Communication with Friends and Family:   . Frequency of Social Gatherings with Friends and Family:   . Attends Religious Services:   . Active Member of Clubs or Organizations:   . Attends Banker Meetings:   Marland Kitchen Marital Status:   Intimate Partner Violence:   . Fear of Current or Ex-Partner:   . Emotionally Abused:   Marland Kitchen Physically Abused:   . Sexually Abused:    Family History  Problem Relation Age of Onset  . Heart Problems Mother   . Heart attack Father   . Healthy Sister        NO HEART ISSUES  . Healthy Sister        NO HEART ISSUES   ROS: All systems reviewed and negative except as per HPI.   Current Outpatient Medications  Medication Sig Dispense Refill  . Alogliptin Benzoate 25 MG TABS Take 12.5 mg by mouth daily.    Marland Kitchen amitriptyline (ELAVIL) 25 MG tablet Take 25 mg by mouth at bedtime.     Marland Kitchen aspirin EC 81 MG tablet Take 1 tablet (81 mg total) by mouth daily. 90 tablet 3  . brimonidine (ALPHAGAN) 0.2 % ophthalmic solution Place 1 drop into the right eye 3 (three) times daily.     . calcium carbonate (TUMS - DOSED IN MG ELEMENTAL CALCIUM) 500 MG chewable tablet Chew 1 tablet by mouth daily as needed for indigestion or heartburn.    . carbidopa-levodopa (SINEMET IR) 25-100 MG tablet Take 1 tablet by mouth 3 (three) times daily.    . Cholecalciferol (VITAMIN D) 2000 UNITS CAPS Take 2,000 Units by mouth daily.     . clobetasol (TEMOVATE) 0.05 % external solution Apply 1 application topically daily as needed.     . diphenhydrAMINE-zinc acetate (BENADRYL) cream Apply 1 application topically 3 (three) times daily as needed for itching.    . docusate sodium (COLACE) 100 MG capsule Take 100 mg by mouth 2 (two) times daily.    . dorzolamide-timolol (COSOPT) 22.3-6.8 MG/ML ophthalmic solution Place 1 drop into the right eye 2 (two) times daily.     Marland Kitchen glipiZIDE  (GLUCOTROL) 10 MG tablet Take 10 mg by mouth 2 (two) times daily before a meal.    . HYDROcodone-acetaminophen (NORCO) 5-325 MG tablet Take 1 tablet by mouth every 4 (four) hours as needed for moderate pain. 20 tablet 0  . ibuprofen (ADVIL) 200 MG tablet Take 200 mg by mouth daily as needed for headache or moderate pain.    Marland Kitchen LANTUS SOLOSTAR 100 UNIT/ML Solostar Pen Inject 15 Units into the skin at bedtime.    Marland Kitchen  latanoprost (XALATAN) 0.005 % ophthalmic solution Place 1 drop into the left eye at bedtime.     . Meclizine HCl 25 MG CHEW Chew 25 mg by mouth daily as needed.     . metFORMIN (GLUCOPHAGE) 500 MG tablet Take 500 mg by mouth 2 (two) times daily with a meal.    . metoprolol succinate (TOPROL-XL) 25 MG 24 hr tablet Take 12.5 mg by mouth 2 (two) times daily.    . Netarsudil-Latanoprost (ROCKLATAN) 0.02-0.005 % SOLN Place 1 drop into the right eye at bedtime.    Marland Kitchen omeprazole (PRILOSEC) 20 MG capsule Take 20 mg by mouth daily as needed.     . polyethylene glycol (MIRALAX / GLYCOLAX) 17 g packet Take 17 g by mouth daily as needed for moderate constipation.    Marland Kitchen rOPINIRole (REQUIP) 3 MG tablet Take 3 mg by mouth daily as needed.     . rosuvastatin (CRESTOR) 5 MG tablet Take 1 tablet (5 mg total) by mouth at bedtime. 30 tablet 11  . SENEXON-S 8.6-50 MG tablet Take 1 tablet by mouth at bedtime as needed.     . sodium chloride (OCEAN) 0.65 % SOLN nasal spray Place 1 spray into both nostrils as needed for congestion.    Marland Kitchen spironolactone (ALDACTONE) 25 MG tablet Take 1 tablet (25 mg total) by mouth daily. 30 tablet 0  . torsemide (DEMADEX) 20 MG tablet Take 40 mg by mouth daily.    . traMADol (ULTRAM) 50 MG tablet Take 50 mg by mouth every 8 (eight) hours as needed.    . vitamin B-12 (CYANOCOBALAMIN) 1000 MCG tablet Take 1,000 mcg by mouth in the morning and at bedtime.     No current facility-administered medications for this encounter.   Exam:   BP (!) 106/38   Pulse 62   Wt 101.9 kg (224 lb  9.6 oz)   SpO2 98%   BMI 32.23 kg/m  General: NAD Neck: No JVD, no thyromegaly or thyroid nodule.  Lungs: Clear to auscultation bilaterally with normal respiratory effort. CV: Nondisplaced PMI.  Heart regular S1/S2, no S3/S4, no murmur.  No peripheral edema.  No carotid bruit.  Normal pedal pulses.  Abdomen: Soft, nontender, no hepatosplenomegaly, no distention.  Skin: Intact without lesions or rashes.  Neurologic: Alert and oriented x 3.  Psych: Normal affect. Extremities: No clubbing or cyanosis.  HEENT: Normal.   Assessment/Plan: 1. Chronic systolic CHF: Nonischemic cardiomyopathy by cath in 2014.  Over the years, EF has ranged 35-45%, echo in 9/20 with EF 40-45%. Most recent echo at [facility] was reported to have EF 45-50%. RHC in 3/21 showed normal filling pressures suggesting good diuresis and preserved cardiac output.  PYP scan was not suggestive of cardiac amyloidosis.  On exam today, he does not look volume overloaded and weight is down.  Symptoms stable, NYHA class II-III.  Now seems more limited by balance than dyspnea. - I was planning to start him on Entresto with low EF, but will hold off for now with recent falls, concern for orthostasis.    - Continue Toprol XL 12.5 mg bid.  - Continue torsemide 40 mg daily, BMET today.  - Continue spironolactone 25 mg daily.   2. CKD: Stage 3.  Follow creatinine closely with diuresis, BMET today.  3. Suspect OSA: home sleep study, he has this at home but needs to use it.  4. Diabetes:  - He would be a good candidate for dapagliflozin in the future.  - He is on  Crestor, will check lipids/LFTs.   5. Ascending aortic aneurysm: 6/19 CTA showed 4.9 cm ascending aorta.  - I will arrange for repeat CTA chest.   6. Atrial fibrillation: Paroxysmal.  Noted in the hospital when he had SAH.  He is in NSR today. Anticoagulation was not started due to frequent falls and SAH.  - He is on ASA 81 daily.   Followup in 4 months.   Signed, Marca Ancona,  MD  09/09/2019  [facility] [address] [facility] [place] [address] [phone] (office) [phone] (fax)

## 2019-09-10 LAB — LIPID PANEL
Cholesterol: 110 mg/dL (ref 0–200)
HDL: 38 mg/dL — ABNORMAL LOW (ref >40)
LDL Cholesterol: 44 mg/dL (ref 0–99)
Total CHOL/HDL Ratio: 2.9 RATIO
Triglycerides: 141 mg/dL (ref <150)
VLDL: 28 mg/dL (ref 0–40)

## 2019-09-14 ENCOUNTER — Ambulatory Visit (HOSPITAL_COMMUNITY): Payer: Medicare Other

## 2019-09-17 ENCOUNTER — Telehealth (HOSPITAL_COMMUNITY): Payer: Self-pay | Admitting: *Deleted

## 2019-09-17 NOTE — Telephone Encounter (Signed)
Pts caregiver called to see if they needed to r/s CT to make it closer to follow up appt. I told her to keep the CT and if its abnormal or the doctor has any concerns we can always bring the patient in sooner than his scheduled follow up in September. Caregiver thanked me for the call and said pt would keep his CT appt.

## 2019-09-18 ENCOUNTER — Ambulatory Visit (HOSPITAL_COMMUNITY)
Admission: RE | Admit: 2019-09-18 | Discharge: 2019-09-18 | Disposition: A | Discharge status: Home / Self Care | Payer: Medicare Other | Source: Ambulatory Visit | Attending: Cardiology | Admitting: Cardiology

## 2019-09-18 ENCOUNTER — Other Ambulatory Visit: Payer: Self-pay

## 2019-09-18 DIAGNOSIS — I7121 Aneurysm of the ascending aorta, without rupture: Secondary | ICD-10-CM

## 2019-09-18 DIAGNOSIS — I712 Thoracic aortic aneurysm, without rupture: Secondary | ICD-10-CM | POA: Insufficient documentation

## 2019-09-18 MED ORDER — IOHEXOL 350 MG/ML SOLN
80.0000 mL | Freq: Once | INTRAVENOUS | Status: AC | PRN
  Administered 2019-09-18: 80 mL via INTRAVENOUS

## 2019-11-16 ENCOUNTER — Other Ambulatory Visit: Payer: Self-pay | Admitting: Cardiothoracic Surgery

## 2019-11-16 DIAGNOSIS — I712 Thoracic aortic aneurysm, without rupture, unspecified: Secondary | ICD-10-CM

## 2019-11-29 ENCOUNTER — Encounter: Payer: Self-pay | Admitting: Gastroenterology

## 2019-11-29 ENCOUNTER — Ambulatory Visit (INDEPENDENT_AMBULATORY_CARE_PROVIDER_SITE_OTHER): Payer: Medicare Other | Admitting: Gastroenterology

## 2019-11-29 VITALS — BP 100/60 | HR 61 | Ht 70.0 in | Wt 226.2 lb

## 2019-11-29 DIAGNOSIS — K224 Dyskinesia of esophagus: Secondary | ICD-10-CM | POA: Diagnosis not present

## 2019-11-29 DIAGNOSIS — K5909 Other constipation: Secondary | ICD-10-CM | POA: Diagnosis not present

## 2019-11-29 DIAGNOSIS — R1314 Dysphagia, pharyngoesophageal phase: Secondary | ICD-10-CM | POA: Diagnosis not present

## 2019-11-29 NOTE — Progress Notes (Signed)
[facility] Consult Note:  History: Alan [name] 11/29/2019  Referring provider: Clinic, [facility]  Reason for consult/chief complaint: Dysphagia (Pt reports he is having trouble swallowing. Unable to keep food down. ), Emesis (Pt states he throws up mucus after he eats. ), and Colonoscopy ([facility]. Last one 10 yrs ago. )   Subjective  HPI:  This is a 79 year old man self-referred and accompanied by his caregiver Elnita Maxwell today.  Alan [name] gets primary care at the [facility], and it sounds like at some point there were plans for endoscopic evaluation of the symptoms, but it never happened to because he required cardiology evaluation.  At any rate, he and his caregiver decided to seek community GI evaluation for this.  He has difficulty with his memory and he did not recall having seen a provider in [place] a few years back.  His caregiver was not working with him at the time so did not have any more information about that.  He was seen for office consult on 09/20/2018 by Dr. Concepcion Elk in [place], evaluating chest pain, vomiting and dysphagia.  He was noted to have extensive cardiac history with coronary artery disease and CHF.  He was describing weeks of chest pain reflux dysphagia, often occurring at night.  Cardiology evaluation was done at that time, ascending aortic aneurysm of 4.8 cm discovered.  Patient was also complaining of constipation and had some relief from as needed use of MiraLAX. Barium study was planned with report as below. Last colonoscopy noted to be in 2010, no report available.   Alan [name] is troubled by at least a few years of a problem that has been steadily worsening.  When he eats or drinks, things feel full and hung up or stuck in the neck or upper chest.  He then produces a lot of mucus that is quite bothersome.  This problem seems worse when he lays down he may have regurgitation of  some material.  He does not have vomiting as such nor nausea. He had previously been told he was due for colonoscopy and wanted to know if he should have that done now as well.  He tends toward constipation for which he takes occasional MiraLAX with some relief.  He is fatigued and dyspneic with minimal exertion, typically uses a walker and sometimes a wheelchair when he has to go any significant distance especially for doctor visit like today.  Alan [name] was frustrated by the weight in the office today and especially because he is quite claustrophobic in rooms with closed doors.  He also admits to having slept poorly last evening, which sounds like a common issue for him. ROS:  Review of Systems  Constitutional: Negative for appetite change and unexpected weight change.  HENT: Negative for mouth sores and voice change.   Eyes: Negative for pain and redness.  Respiratory: Positive for cough and shortness of breath.   Cardiovascular: Negative for chest pain and palpitations.  Genitourinary: Negative for dysuria and hematuria.  Musculoskeletal: Negative for arthralgias and myalgias.  Skin: Negative for pallor and rash.  Neurological: Negative for weakness and headaches.  Hematological: Negative for adenopathy.     Past Medical History: Past Medical History:  Diagnosis Date  . A-fib (HCC)   . AAA (abdominal aortic aneurysm) (HCC) 2018  . Arthritis   . CHF (congestive heart failure) (HCC)   . Chronic systolic (congestive) heart failure (HCC)   . Chronic systolic  CHF (congestive heart failure) (HCC)   . Diabetes mellitus ORAL MED  . Diverticulosis   . ED (erectile dysfunction)   . GAD (generalized anxiety disorder)   . GERD (gastroesophageal reflux disease)   . Glaucoma of right eye   . History of diverticulitis   . History of nuclear stress test    MV 1/18: EF 33, no ischemia; high risk  . History of prostate cancer   . Lung nodules   . Malfunction of artificial urethral sphincter  (HCC)   . Malfunction of penile prosthesis (HCC)   . NICM (nonischemic cardiomyopathy) (HCC) 02/06/2015   LHC 11/14: EF 35, no significant CAD // Echo 7/16: Diff HK worse in inf base, EF 40%, Gr 1 DD, mild AI, mild MR, mod LAE // Echo 5/18: Moderate concentric LVH, EF 35-40, inferolateral and anterolateral AK, normal wall motion, moderate AI, moderate MR, moderate LAE, mild PI  . Nocturia   . Presbyesophagus 2018  . Snoring   . SUI (stress urinary incontinence), male   . Thoracic aortic aneurysm without rupture (HCC) 02/06/2015   Chest CTA 1/18:  5.1 cm ascending aortic aneurysm, mild cardiomegaly, mild coronary artery calcification, upper lobe small airway disease, cholelithiasis, 16 mm right thyroid nodule  . Thyroid nodule    I reviewed the most recent [facility] cardiology note from 09/07/2019 by Dr. Shirlee Latch. Diabetes, CKD stage [name], thoracic aortic aneurysm, systolic CHF. "Alan [name] is a 79 y.o. male who has a history of diabetes, CKD stage 3, thoracic aortic aneurysm, and chronic systolic CHF.  Patient has a long history of CHF, diagnosed around 2014.  Cath in 2014 showed no significant CAD.  Over the years, his EF has ranged from 35-45%, never markedly low.  Echo from 9/20 showed EF 40-45% with normal RV function.     For several years, he has had gradually worsening exertional dyspnea and decreased exercise tolerance.  He sleeps very poor due to orthopnea and PND.  He is very sleepy during the day.  He is short of breath walking around the house, walking to the mailbox, and walking up any incline.  No chest pain.    He has had a history of frequent falls, his girlfriend says that he seems to fall asleep standing at times and will fall (does not pass out).  In 1/21, he fell and hit his head.  He was found to have a traumatic subarachnoid hemorrhage and a left radial fracture.  Aspirin was stopped.  He was noted to be in atrial fibrillation during this admission.  He was not  anticoagulated due to falls and SAH.  Echo at [facility] in 1/21 showed EF 45-50% with severe LV dilation, moderate LVH, mild-moderate MR, mild-moderate AI.    Patient had RHC in 3/21, showing optimized filling pressures and normal cardiac index.  PYP scan in 3/21 was not suggestive of TTR amyloidosis.    Patient returns for followup of CHF.  He is symptomatically stable.  He is having less falls now, fell once in 07/26/19 (mechanical) with no fractures.  He continues to fatigue easily.  No dyspnea walking around his house.  He gets short of breath walking in the grocery store.  Mild orthopnea.  No chest pain. Weight is down 4 lbs. "  "Assessment/Plan: 1. Chronic systolic CHF: Nonischemic cardiomyopathy by cath in 2014.  Over the years, EF has ranged 35-45%, echo in 9/20 with EF 40-45%. Most recent echo at [facility] was reported to have EF 45-50%. RHC  in 3/21 showed normal filling pressures suggesting good diuresis and preserved cardiac output.  PYP scan was not suggestive of cardiac amyloidosis.  On exam today, he does not look volume overloaded and weight is down.  Symptoms stable, NYHA class II-[name].  Now seems more limited by balance than dyspnea. - I was planning to start him on Entresto with low EF, but will hold off for now with recent falls, concern for orthostasis.    - Continue Toprol XL 12.5 mg bid.  - Continue torsemide 40 mg daily, BMET today.  - Continue spironolactone 25 mg daily.   2. CKD: Stage 3.  Follow creatinine closely with diuresis, BMET today.  3. Suspect OSA: home sleep study, he has this at home but needs to use it.  4. Diabetes:  - He would be a good candidate for dapagliflozin in the future.  - He is on Crestor, will check lipids/LFTs.   5. Ascending aortic aneurysm: 6/19 CTA showed 4.9 cm ascending aorta.  - I will arrange for repeat CTA chest.   6. Atrial fibrillation: Paroxysmal.  Noted in the hospital when he had SAH.  He is in NSR today. Anticoagulation was not started due  to frequent falls and SAH.  - He is on ASA 81 daily.    Followup in 4 months.    Signed, Marca Ancona, MD  09/09/2019"   Dr. Alford Highland note also indicates a history of diabetic gastroparesis   Past Surgical History: Past Surgical History:  Procedure Laterality Date  . ANKLE SURGERY Bilateral   . BIOPSY THYROID    . COLONOSCOPY  2010  . I & D/ ORIF RIGHT INDEX FINGER  01-19-2005  . INSERTION INFLATABLE PENILE PROSTHESIS  06-22-1999   MENTOR ALPHA I  . IR GENERIC HISTORICAL  06/17/2016   IR RADIOLOGIST EVAL & MGMT 06/17/2016 Jolaine Click, MD [facility] INTERV RAD  . LEFT HEART CATHETERIZATION WITH CORONARY ANGIOGRAM N/A 03/15/2013   Procedure: LEFT HEART CATHETERIZATION WITH CORONARY ANGIOGRAM;  Surgeon: [name], MD;  Location: [facility] CATH LAB;  Service: Cardiovascular;  Laterality: N/A;  . PENILE PROSTHESIS IMPLANT  09/24/2011   Procedure: PENILE PROTHESIS INFLATABLE;  Surgeon: [name], MD;  Location: [facility];  Service: Urology;  Laterality: N/A;  REPLACEMENT OF INFLATABLE PENILE PROTHESIS (COLOPLAST)    . PLACEMENT ARTIFICIAL URINARY SPHINTER  01-20-2001   [id number]  . REMOVAL AND REPLACEMENT GU SPHINTER CUFF  05-15-2004   STRESS URINARY INCONTINENCE  . RIGHT HEART CATH N/A 06/28/2019   Procedure: RIGHT HEART CATH;  Surgeon: Laurey Morale, MD;  Location: [facility] INVASIVE CV LAB;  Service: Cardiovascular;  Laterality: N/A;     Family History: Family History  Problem Relation Age of Onset  . Heart Problems Mother   . Heart attack Father   . Healthy Sister        NO HEART ISSUES  . Healthy Sister        NO HEART ISSUES    Social History: Social History   Socioeconomic History  . Marital status: Divorced    Spouse name: Not on file  . Number of children: Not on file  . Years of education: Not on file  . Highest education level: Not on file  Occupational History  . Not on file  Tobacco Use  . Smoking status: Never Smoker  . Smokeless tobacco:  Never Used  Vaping Use  . Vaping Use: Never used  Substance and Sexual Activity  . Alcohol use: No  .  Drug use: No  . Sexual activity: Not on file  Other Topics Concern  . Not on file  Social History Narrative  . Not on file   Social Determinants of Health   Financial Resource Strain:   . Difficulty of Paying Living Expenses:   Food Insecurity:   . Worried About Programme researcher, broadcasting/film/video in the Last Year:   . Barista in the Last Year:   Transportation Needs:   . Freight forwarder (Medical):   Marland Kitchen Lack of Transportation (Non-Medical):   Physical Activity:   . Days of Exercise per Week:   . Minutes of Exercise per Session:   Stress:   . Feeling of Stress :   Social Connections:   . Frequency of Communication with Friends and Family:   . Frequency of Social Gatherings with Friends and Family:   . Attends Religious Services:   . Active Member of Clubs or Organizations:   . Attends Banker Meetings:   Marland Kitchen Marital Status:     Allergies: Allergies  Allergen Reactions  . Ezetimibe Other (See Comments)    Unknown   . Statins Other (See Comments)    Myalgias  . Lacosamide Rash  . Other     Pt is extremely claustrophobic, will have a severe panic attack if he is left alone in a small space including an empty hospital room. Especially after waking up from surgery.   . Pravastatin Palpitations  . Ropinirole     Nightmares     Outpatient Meds: Current Outpatient Medications  Medication Sig Dispense Refill  . Alogliptin Benzoate 25 MG TABS Take 12.5 mg by mouth daily.    Marland Kitchen aspirin EC 81 MG tablet Take 1 tablet (81 mg total) by mouth daily. 90 tablet 3  . brimonidine (ALPHAGAN) 0.2 % ophthalmic solution Place 1 drop into the right eye 3 (three) times daily.     . calcium carbonate (TUMS - DOSED IN MG ELEMENTAL CALCIUM) 500 MG chewable tablet Chew 1 tablet by mouth daily as needed for indigestion or heartburn.    . Cholecalciferol (VITAMIN D) 2000 UNITS CAPS  Take 2,000 Units by mouth daily.     . clobetasol (TEMOVATE) 0.05 % external solution Apply 1 application topically daily as needed.     . docusate sodium (COLACE) 100 MG capsule Take 100 mg by mouth 2 (two) times daily.    . dorzolamide-timolol (COSOPT) 22.3-6.8 MG/ML ophthalmic solution Place 1 drop into the right eye 2 (two) times daily.     Marland Kitchen glipiZIDE (GLUCOTROL) 10 MG tablet Take 10 mg by mouth 2 (two) times daily before a meal.    . LANTUS SOLOSTAR 100 UNIT/ML Solostar Pen Inject 18 Units into the skin at bedtime.     Marland Kitchen latanoprost (XALATAN) 0.005 % ophthalmic solution Place 1 drop into the left eye at bedtime.     . Meclizine HCl 25 MG CHEW Chew 25 mg by mouth daily as needed.     . metFORMIN (GLUCOPHAGE) 500 MG tablet Take 500 mg by mouth 2 (two) times daily with a meal.    . metoprolol succinate (TOPROL-XL) 25 MG 24 hr tablet Take 12.5 mg by mouth 2 (two) times daily.    . Netarsudil-Latanoprost (ROCKLATAN) 0.02-0.005 % SOLN Place 1 drop into the right eye at bedtime.    . polyethylene glycol (MIRALAX / GLYCOLAX) 17 g packet Take 17 g by mouth daily as needed for moderate constipation.    . rosuvastatin (  CRESTOR) 5 MG tablet Take 1 tablet (5 mg total) by mouth at bedtime. 30 tablet 11  . spironolactone (ALDACTONE) 25 MG tablet Take 1 tablet (25 mg total) by mouth daily. 30 tablet 0  . torsemide (DEMADEX) 20 MG tablet Take 40 mg by mouth daily.    . traMADol (ULTRAM) 50 MG tablet Take 50 mg by mouth every 8 (eight) hours as needed.    . vitamin B-12 (CYANOCOBALAMIN) 1000 MCG tablet Take 1,000 mcg by mouth in the morning and at bedtime.     No current facility-administered medications for this visit.      ___________________________________________________________________ Objective   Exam:  BP 100/60 (BP Location: Right Arm, Patient Position: Sitting, Cuff Size: Normal)   Pulse 61   Ht 5\' 10"  (1.778 m)   Wt 226 lb 4 oz (102.6 kg)   BMI 32.46 kg/m    General: Chronically  ill-appearing man, restricted affect.  Throughout the course of the visit he became more interactive and conversational.  Eyes: sclera anicteric, no redness  ENT: oral mucosa moist without lesions, no cervical or supraclavicular lymphadenopathy.  Poor bottom dentition.  One remaining upper tooth in poor condition.  He says he has a plate that he typically uses but just did not wear it today.  CV: RRR without murmur, S1/S2, no JVD, + peripheral edema  Resp: clear to auscultation bilaterally, normal RR and effort noted  GI: Limited exam in wheelchair.  Soft, obese, no tenderness, with active bowel sounds. No guarding or palpable organomegaly noted, limited by body habitus and positioning.  Reducible umbilical hernia  Skin; warm and dry, no rash or jaundice noted  Neuro: awake, alert and oriented x 3. Normal gross motor function and fluent speech  Labs:  Last hemoglobin A1c on file in our EMR is 8.5 in May 2018  Radiologic Studies:  CLINICAL DATA:  79 year old male inpatient with dysphagia to thin liquids, also reports intermittent difficulty handling secretions. He reports occasional regurgitation of some liquids, and also coughing while drinking.   EXAM: ESOPHOGRAM / BARIUM SWALLOW / BARIUM TABLET STUDY   TECHNIQUE: Combined double contrast and single contrast examination performed using effervescent crystals and barium liquid. The patient was observed with fluoroscopy swallowing a 13 mm barium sulphate tablet.   FLUOROSCOPY TIME:  Fluoroscopy Time:  1 minutes 24 seconds   Radiation Exposure Index (if provided by the fluoroscopic device):   Number of Acquired Spot Images: 0   COMPARISON:  Chest radiographs 09/08/2016.  Chest CT 05/10/2016   FINDINGS: A double contrast study was undertaken and the patient tolerated this well and without difficulty.   Frequent tertiary contractions, and decreased esophageal motility, but no other obstruction to the forward flow of  contrast throughout the esophagus and into the stomach. In the absence of tertiary contractions the esophageal course and contour are normal.   A 12.5 mm barium tablet was administered and passed freely to the stomach without delay (Series 10). Normal gastroesophageal junction.   Direct observation of the cervical esophagus was performed, and the cervical esophagus appears normal. Furthermore, no penetration or aspiration occurred during the exam.   IMPRESSION: 1. No aspiration occurred during this exam, and the cervical esophagus appears normal. Still, if coughing with liquids persists or progresses, or if there is a recent history of pneumonia, follow-up Speech Pathology evaluation would be valuable. 2. 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     Electronically Signed   By: 05/12/2016  M.D.   On: 09/09/2016 12:41  Assessment: Encounter Diagnoses  Name Primary?  . Pharyngoesophageal dysphagia Yes  . Esophageal dysmotility   . Chronic constipation     Years of pharyngeal esophageal dysphagia, difficult to characterize, triggers significant mucus production.  His chart indicates history of diabetic gastroparesis, but no details sent no records from [place] to know the work-up and accuracy of that diagnosis.  He is certainly known to have esophageal dysmotility based on 2018 testing as noted above.  This may be progression of what appeared to be a presbyesophagus condition, it could be vigorous achalasia, seems less likely to be reflux based on his description.  He does not seem to have nausea and vomiting as would typically be expected from gastroparesis.  Plan:  Upper endoscopy next week.  It must be done in the hospital endoscopy lab due to his medical complexity.  The benefits and risks of the planned procedure were described in detail with the patient or (when appropriate) their health care proxy.  Risks were  outlined as including, but not limited to, bleeding, infection, perforation, adverse medication reaction leading to cardiac or pulmonary decompensation, pancreatitis (if ERCP).  The limitation of incomplete mucosal visualization was also discussed.  No guarantees or warranties were given.  Patient at increased risk for cardiopulmonary complications of procedure due to medical comorbidities.  Based on those findings, most likely following up with modified barium study and full esophagram and possibly gastric emptying study if that is unrevealing.  Lastly, he is high risk for colonoscopy given his age and poor medical condition.  I also think he is not likely to tolerate the bowel preparation given his upper digestive symptoms.  He and his caregiver were in agreement with that and ready to proceed with further upper GI testing.   Alan [name]  CC: Referring provider noted above

## 2019-11-29 NOTE — Patient Instructions (Addendum)
If you are age 78 or older, your body mass index should be between 23-30. Your Body mass index is 32.46 kg/m. If this is out of the aforementioned range listed, please consider follow up with your Primary Care Provider.  If you are age 3 or younger, your body mass index should be between 19-25. Your Body mass index is 32.46 kg/m. If this is out of the aformentioned range listed, please consider follow up with your Primary Care Provider.   You have been scheduled for an endoscopy. Please follow written instructions given to you at your visit today. If you use inhalers (even only as needed), please bring them with you on the day of your procedure.  Due to recent COVID-19 restrictions implemented by our local and state authorities and in an effort to keep both patients and staff as safe as possible, our hospital system now requires COVID-19 testing prior to any scheduled hospital procedure. Please go to [address] [place], [place], [place] [address] on 11-30-2019 at  245pm. This is a drive up testing site, you will not need to exit your vehicle.  You will not be billed at the time of testing but may receive a bill later depending on your insurance. The approximate cost of the test is $100. You must agree to quarantine from the time of your testing until the procedure date on 12-04-2019 . This should include staying at home with ONLY the people you live with. Avoid take-out, grocery store shopping or leaving the house for any non-emergent reason. Failure to have your COVID-19 test done on the date and time you have been scheduled will result in cancellation of procedure. Please call our office at [phone] if you have any questions.   It was a pleasure to see you today!  Dr. Myrtie Neither

## 2019-11-30 ENCOUNTER — Other Ambulatory Visit (HOSPITAL_COMMUNITY)
Admission: RE | Admit: 2019-11-30 | Discharge: 2019-11-30 | Disposition: A | Discharge status: Home / Self Care | Payer: Medicare Other | Source: Ambulatory Visit | Attending: Gastroenterology | Admitting: Gastroenterology

## 2019-11-30 DIAGNOSIS — Z01812 Encounter for preprocedural laboratory examination: Secondary | ICD-10-CM | POA: Insufficient documentation

## 2019-11-30 DIAGNOSIS — Z20822 Contact with and (suspected) exposure to covid-19: Secondary | ICD-10-CM | POA: Diagnosis not present

## 2019-11-30 LAB — SARS CORONAVIRUS 2 (TAT 6-24 HRS): SARS Coronavirus 2: NEGATIVE

## 2019-12-03 ENCOUNTER — Encounter (HOSPITAL_COMMUNITY): Payer: Self-pay | Admitting: Gastroenterology

## 2019-12-03 NOTE — Progress Notes (Addendum)
Personal caregiver to follow up with [facility] An[facility] Fret regarding sleep apnea risk score 5. Also requests that Pt "not be left in closed places" and request "warm blankets" after changing.

## 2019-12-04 ENCOUNTER — Encounter (HOSPITAL_COMMUNITY): Payer: Self-pay | Admitting: Gastroenterology

## 2019-12-04 ENCOUNTER — Ambulatory Visit (HOSPITAL_COMMUNITY): Payer: Medicare Other | Admitting: Certified Registered Nurse Anesthetist

## 2019-12-04 ENCOUNTER — Encounter (HOSPITAL_COMMUNITY)
Admission: RE | Disposition: A | Discharge status: Home / Self Care | Payer: Self-pay | Source: Home / Self Care | Attending: Gastroenterology

## 2019-12-04 ENCOUNTER — Other Ambulatory Visit: Payer: Self-pay

## 2019-12-04 ENCOUNTER — Ambulatory Visit (HOSPITAL_COMMUNITY)
Admission: RE | Admit: 2019-12-04 | Discharge: 2019-12-04 | Disposition: A | Discharge status: Home / Self Care | Payer: Medicare Other | Attending: Gastroenterology | Admitting: Gastroenterology

## 2019-12-04 DIAGNOSIS — E1165 Type 2 diabetes mellitus with hyperglycemia: Secondary | ICD-10-CM | POA: Diagnosis not present

## 2019-12-04 DIAGNOSIS — I714 Abdominal aortic aneurysm, without rupture: Secondary | ICD-10-CM | POA: Insufficient documentation

## 2019-12-04 DIAGNOSIS — I712 Thoracic aortic aneurysm, without rupture: Secondary | ICD-10-CM | POA: Insufficient documentation

## 2019-12-04 DIAGNOSIS — R1314 Dysphagia, pharyngoesophageal phase: Secondary | ICD-10-CM

## 2019-12-04 DIAGNOSIS — K224 Dyskinesia of esophagus: Secondary | ICD-10-CM

## 2019-12-04 DIAGNOSIS — I110 Hypertensive heart disease with heart failure: Secondary | ICD-10-CM | POA: Insufficient documentation

## 2019-12-04 DIAGNOSIS — N529 Male erectile dysfunction, unspecified: Secondary | ICD-10-CM | POA: Insufficient documentation

## 2019-12-04 DIAGNOSIS — E119 Type 2 diabetes mellitus without complications: Secondary | ICD-10-CM | POA: Diagnosis not present

## 2019-12-04 DIAGNOSIS — E669 Obesity, unspecified: Secondary | ICD-10-CM | POA: Diagnosis not present

## 2019-12-04 DIAGNOSIS — Z8546 Personal history of malignant neoplasm of prostate: Secondary | ICD-10-CM | POA: Diagnosis not present

## 2019-12-04 DIAGNOSIS — K228 Other specified diseases of esophagus: Secondary | ICD-10-CM | POA: Diagnosis not present

## 2019-12-04 DIAGNOSIS — I428 Other cardiomyopathies: Secondary | ICD-10-CM | POA: Diagnosis not present

## 2019-12-04 DIAGNOSIS — Z6832 Body mass index (BMI) 32.0-32.9, adult: Secondary | ICD-10-CM | POA: Diagnosis not present

## 2019-12-04 DIAGNOSIS — I5022 Chronic systolic (congestive) heart failure: Secondary | ICD-10-CM | POA: Insufficient documentation

## 2019-12-04 DIAGNOSIS — N393 Stress incontinence (female) (male): Secondary | ICD-10-CM | POA: Insufficient documentation

## 2019-12-04 DIAGNOSIS — K5909 Other constipation: Secondary | ICD-10-CM

## 2019-12-04 HISTORY — PX: ESOPHAGOGASTRODUODENOSCOPY: SHX5428

## 2019-12-04 HISTORY — PX: BALLOON DILATION: SHX5330

## 2019-12-04 SURGERY — EGD (ESOPHAGOGASTRODUODENOSCOPY)
Anesthesia: Monitor Anesthesia Care

## 2019-12-04 MED ORDER — PROPOFOL 10 MG/ML IV BOLUS
INTRAVENOUS | Status: DC | PRN
  Administered 2019-12-04: 10 mg via INTRAVENOUS

## 2019-12-04 MED ORDER — PROPOFOL 1000 MG/100ML IV EMUL
INTRAVENOUS | Status: AC
  Filled 2019-12-04: qty 200

## 2019-12-04 MED ORDER — PROPOFOL 500 MG/50ML IV EMUL
INTRAVENOUS | Status: AC
  Filled 2019-12-04: qty 50

## 2019-12-04 MED ORDER — PROPOFOL 10 MG/ML IV BOLUS
INTRAVENOUS | Status: AC
  Filled 2019-12-04: qty 20

## 2019-12-04 MED ORDER — SODIUM CHLORIDE 0.9 % IV SOLN: INTRAVENOUS | Status: DC

## 2019-12-04 MED ORDER — PROPOFOL 500 MG/50ML IV EMUL
INTRAVENOUS | Status: DC | PRN
  Administered 2019-12-04: 100 ug/kg/min via INTRAVENOUS

## 2019-12-04 MED ORDER — LACTATED RINGERS IV SOLN
INTRAVENOUS | Status: DC
  Administered 2019-12-04: 1000 mL via INTRAVENOUS

## 2019-12-04 NOTE — H&P (Signed)
History:  This patient presents for endoscopic testing for dysphagia.  (details in 11/29/19 [facility] clinic consult note) No clinical changes since then  Garald Braver Referring physician: [facility]  Past Medical History: Past Medical History:  Diagnosis Date  . A-fib (HCC)   . AAA (abdominal aortic aneurysm) (HCC) 2018  . Arthritis   . CHF (congestive heart failure) (HCC)   . Chronic systolic (congestive) heart failure (HCC)   . Chronic systolic CHF (congestive heart failure) (HCC)   . Claustrophobia    EXTREME   . Diabetes mellitus ORAL MED  . Diverticulosis   . ED (erectile dysfunction)   . GAD (generalized anxiety disorder)   . GERD (gastroesophageal reflux disease)   . Glaucoma of right eye   . History of diverticulitis   . History of nuclear stress test    MV 1/18: EF 33, no ischemia; high risk  . History of prostate cancer   . Lung nodules   . Malfunction of artificial urethral sphincter (HCC)   . Malfunction of penile prosthesis (HCC)   . NICM (nonischemic cardiomyopathy) (HCC) 02/06/2015   LHC 11/14: EF 35, no significant CAD // Echo 7/16: Diff HK worse in inf base, EF 40%, Gr 1 DD, mild AI, mild MR, mod LAE // Echo 5/18: Moderate concentric LVH, EF 35-40, inferolateral and anterolateral AK, normal wall motion, moderate AI, moderate MR, moderate LAE, mild PI  . Nocturia   . Presbyesophagus 2018  . Snoring   . SUI (stress urinary incontinence), male   . Thoracic aortic aneurysm without rupture (HCC) 02/06/2015   Chest CTA 1/18:  5.1 cm ascending aortic aneurysm, mild cardiomegaly, mild coronary artery calcification, upper lobe small airway disease, cholelithiasis, 16 mm right thyroid nodule  . Thyroid nodule      Past Surgical History: Past Surgical History:  Procedure Laterality Date  . ANKLE SURGERY Bilateral   . BIOPSY THYROID    . COLONOSCOPY  2010  . I & D/ ORIF RIGHT INDEX FINGER  01-19-2005  . INSERTION INFLATABLE PENILE PROSTHESIS   06-22-1999   MENTOR ALPHA I  . IR GENERIC HISTORICAL  06/17/2016   IR RADIOLOGIST EVAL & MGMT 06/17/2016 Jolaine Click, MD [facility] INTERV RAD  . LEFT HEART CATHETERIZATION WITH CORONARY ANGIOGRAM N/A 03/15/2013   Procedure: LEFT HEART CATHETERIZATION WITH CORONARY ANGIOGRAM;  Surgeon: [name], MD;  Location: [facility] CATH LAB;  Service: Cardiovascular;  Laterality: N/A;  . PENILE PROSTHESIS IMPLANT  09/24/2011   Procedure: PENILE PROTHESIS INFLATABLE;  Surgeon: [name], MD;  Location: [facility];  Service: Urology;  Laterality: N/A;  REPLACEMENT OF INFLATABLE PENILE PROTHESIS (COLOPLAST)    . PLACEMENT ARTIFICIAL URINARY SPHINTER  01-20-2001   [id number]  . REMOVAL AND REPLACEMENT GU SPHINTER CUFF  05-15-2004   STRESS URINARY INCONTINENCE  . RIGHT HEART CATH N/A 06/28/2019   Procedure: RIGHT HEART CATH;  Surgeon: Laurey Morale, MD;  Location[facility] INVASIVE CV LAB;  Service: Cardiovascular;  Laterality: N/A;    Allergies: Allergies  Allergen Reactions  . Ezetimibe Other (See Comments)    Confusion  . Statins Other (See Comments)    Myalgias  . Lacosamide Rash  . Other     Pt is extremely claustrophobic, will have a severe panic attack if he is left alone in a small space including an empty hospital room. Especially after waking up from surgery.   . Pravastatin Palpitations  . Ropinirole     Nightmares  Outpatient Meds: Current Facility-Administered Medications  Medication Dose Route Frequency Provider Last Rate Last Admin  . 0.9 %  sodium chloride infusion   Intravenous Continuous Danis, Starr Lake III, MD          ___________________________________________________________________ Objective   Exam:  BP (!) 121/44   Pulse 61   Temp 97.8 F (36.6 C) (Axillary)   Resp 20   Ht 5\' 10"  (1.778 m)   Wt 102.5 kg   SpO2 99%   BMI 32.43 kg/m    CV: RRR without murmur, S1/S2, no JVD, no peripheral edema  Resp: clear to auscultation bilaterally, normal  RR and effort noted  GI: soft, no tenderness, with active bowel sounds. No guarding or palpable organomegaly noted.  Neuro: awake, alert and oriented x 3. Normal gross motor function and fluent speech   Assessment:  Pharyngoesophageal dysphagia  Plan:  EGD with possible dilation   III

## 2019-12-04 NOTE — Discharge Instructions (Signed)
YOU HAD AN ENDOSCOPIC PROCEDURE TODAY: Refer to the procedure report and other information in the discharge instructions given to you for any specific questions about what was found during the examination. If this information does not answer your questions, please call [place] office at [phone] to clarify.   YOU SHOULD EXPECT: Some feelings of bloating in the abdomen. Passage of more gas than usual. Walking can help get rid of the air that was put into your GI tract during the procedure and reduce the bloating. If you had a lower endoscopy (such as a colonoscopy or flexible sigmoidoscopy) you may notice spotting of blood in your stool or on the toilet paper. Some abdominal soreness may be present for a day or two, also.  DIET: Your first meal following the procedure should be a light meal and then it is ok to progress to your normal diet. A half-sandwich or bowl of soup is an example of a good first meal. Heavy or fried foods are harder to digest and may make you feel nauseous or bloated. Drink plenty of fluids but you should avoid alcoholic beverages for 24 hours. If you had a esophageal dilation, please see attached instructions for diet.    ACTIVITY: Your care partner should take you home directly after the procedure. You should plan to take it easy, moving slowly for the rest of the day. You can resume normal activity the day after the procedure however YOU SHOULD NOT DRIVE, use power tools, machinery or perform tasks that involve climbing or major physical exertion for 24 hours (because of the sedation medicines used during the test).   SYMPTOMS TO REPORT IMMEDIATELY: A gastroenterologist can be reached at any hour. Please call [phone]  for any of the following symptoms:   Following upper endoscopy (EGD, EUS, ERCP, esophageal dilation) Vomiting of blood or coffee ground material  New, significant abdominal pain  New, significant chest pain or pain under the shoulder blades  Painful or  persistently difficult swallowing  New shortness of breath  Black, tarry-looking or red, bloody stools  FOLLOW UP:  If any biopsies were taken you will be contacted by phone or by letter within the next 1-3 weeks. Call [phone]  if you have not heard about the biopsies in 3 weeks.  Please also call with any specific questions about appointments or follow up tests.  

## 2019-12-04 NOTE — Transfer of Care (Signed)
Immediate Anesthesia Transfer of Care Note  Patient: Alan [name]  Procedure(s) Performed: ESOPHAGOGASTRODUODENOSCOPY (EGD) (N/A ) BALLOON DILATION (N/A )  Patient Location: Endoscopy Unit  Anesthesia Type:MAC  Level of Consciousness: drowsy and patient cooperative  Airway & Oxygen Therapy: Patient Spontanous Breathing and Patient connected to nasal cannula oxygen  Post-op Assessment: Report given to RN and Post -op Vital signs reviewed and stable  Post vital signs: Reviewed and stable  Last Vitals:  Vitals Value Taken Time  BP 123/55 12/04/19 1006  Temp    Pulse 57 12/04/19 1007  Resp 19 12/04/19 1007  SpO2 97 % 12/04/19 1007  Vitals shown include unvalidated device data.  Last Pain:  Vitals:   12/04/19 0807  TempSrc: Axillary  PainSc: 0-No pain         Complications: No complications documented.

## 2019-12-04 NOTE — Op Note (Signed)
[facility] Patient Name: Alan [name] Procedure Date: 12/04/2019 MRN: [id number] Attending MD: [name] , MD Date of Birth: 12-Nov-1940 CSN: [id number] Age: 79 Admit Type: Outpatient Procedure:                Upper GI endoscopy Indications:              Esophageal dysphagia (11/29/19 office consult note                            with details. Barium study in 2018 suggested                            dysmotility) Providers:                [name], MD, [name], RN, [name],                            Technician, [name], Technician Referring MD:             [name] Medicines:                Monitored Anesthesia Care Complications:            No immediate complications. Estimated Blood Loss:     Estimated blood loss: none. Procedure:                Pre-Anesthesia Assessment:                           - Prior to the procedure, a History and Physical                            was performed, and patient medications and                            allergies were reviewed. The patient's tolerance of                            previous anesthesia was also reviewed. The risks                            and benefits of the procedure and the sedation                            options and risks were discussed with the patient.                            All questions were answered, and informed consent                            was obtained. Prior Anticoagulants: The patient has                            taken no previous anticoagulant or antiplatelet  agents. ASA Grade Assessment: III - A patient with                            severe systemic disease. After reviewing the risks                            and benefits, the patient was deemed in                            satisfactory condition to undergo the procedure.                           After obtaining informed consent, the endoscope was                             passed under direct vision. Throughout the                            procedure, the patient's blood pressure, pulse, and                            oxygen saturations were monitored continuously. The                            GIF-H190 ([id number]) Olympus gastroscope was                            introduced through the mouth, and advanced to the                            second part of duodenum. The upper GI endoscopy was                            accomplished without difficulty. The patient                            tolerated the procedure well. Scope In: Scope Out: Findings:      Abnormal motility was noted in the esophagus. The cricopharyngeus was       normal. There is spasticity of the esophageal body. The distal       esophagus/lower esophageal sphincter is spastic, but gives up passage to       the endoscope. A TTS dilator was passed through the scope. Dilation with       a 15-16.5-18 mm balloon dilator was performed to 18 mm. The dilation       site was examined and showed no mucosal tear.      There is no endoscopic evidence of Barrett's esophagus, esophagitis,       hiatal hernia or stricture in the entire esophagus.      The stomach was normal.      The cardia and gastric fundus were normal on retroflexion.      The examined duodenum was normal. Impression:               - Abnormal esophageal motility. Dilated.                           -  Normal stomach.                           - Normal examined duodenum.                           - No specimens collected. Moderate Sedation:      MAC sedation used Recommendation:           - Patient has a contact number available for                            emergencies. The signs and symptoms of potential                            delayed complications were discussed with the                            patient. Return to normal activities tomorrow.                            Written discharge instructions were  provided to the                            patient.                           - Resume previous diet.                           - Continue present medications.                           - Perform routine esophageal manometry at                            appointment to be scheduled. Procedure Code(s):        --- Professional ---                           [phone], Esophagogastroduodenoscopy, flexible,                            transoral; with transendoscopic balloon dilation of                            esophagus (less than 30 mm diameter) Diagnosis Code(s):        --- Professional ---                           K22.4, Dyskinesia of esophagus                           R13.14, Dysphagia, pharyngoesophageal phase CPT copyright 2019 American [employer] Association. All rights reserved. The codes documented in this report are preliminary and upon coder review may  be revised to meet current compliance requirements. [name], MD 12/04/2019 10:07:11 AM This report has been signed electronically. Number  of Addenda: 0

## 2019-12-04 NOTE — Interval H&P Note (Signed)
History and Physical Interval Note:  12/04/2019 9:33 AM  Alan [name]  has presented today for surgery, with the diagnosis of Dysphagia.  The various methods of treatment have been discussed with the patient and family. After consideration of risks, benefits and other options for treatment, the patient has consented to  Procedure(s): ESOPHAGOGASTRODUODENOSCOPY (EGD) (N/A) as a surgical intervention.  The patient's history has been reviewed, patient examined, no change in status, stable for surgery.  I have reviewed the patient's chart and labs.  Questions were answered to the patient's satisfaction.     Charlie Pitter III

## 2019-12-04 NOTE — Interval H&P Note (Signed)
History and Physical Interval Note:  12/04/2019 9:34 AM  Alan [name]  has presented today for surgery, with the diagnosis of Dysphagia.  The various methods of treatment have been discussed with the patient and family. After consideration of risks, benefits and other options for treatment, the patient has consented to  Procedure(s): ESOPHAGOGASTRODUODENOSCOPY (EGD) (N/A) as a surgical intervention.  The patient's history has been reviewed, patient examined, no change in status, stable for surgery.  I have reviewed the patient's chart and labs.  Questions were answered to the patient's satisfaction.     Charlie Pitter III

## 2019-12-04 NOTE — Anesthesia Postprocedure Evaluation (Signed)
Anesthesia Post Note  Patient: Alan [name]  Procedure(s) Performed: ESOPHAGOGASTRODUODENOSCOPY (EGD) (N/A ) BALLOON DILATION (N/A )     Patient location during evaluation: PACU Anesthesia Type: MAC Level of consciousness: awake and alert and oriented Pain management: pain level controlled Vital Signs Assessment: post-procedure vital signs reviewed and stable Respiratory status: spontaneous breathing, nonlabored ventilation and respiratory function stable Cardiovascular status: stable and blood pressure returned to baseline Postop Assessment: no apparent nausea or vomiting Anesthetic complications: no   No complications documented.  Last Vitals:  Vitals:   12/04/19 1020 12/04/19 1030  BP: (!) 134/57 139/61  Pulse: 61 62  Resp: 17 17  Temp:    SpO2: 98% 98%    Last Pain:  Vitals:   12/04/19 1030  TempSrc:   PainSc: 0-No pain                 Cyntia Staley A.

## 2019-12-04 NOTE — Anesthesia Preprocedure Evaluation (Signed)
Anesthesia Evaluation  Patient identified by MRN, date of birth, ID band Patient awake    Reviewed: Allergy & Precautions, NPO status , Patient's Chart, lab work & pertinent test results, reviewed documented beta blocker date and time   Airway Mallampati: III  TM Distance: >3 FB Neck ROM: Full    Dental no notable dental hx. (+) Teeth Intact   Pulmonary neg pulmonary ROS,    breath sounds clear to auscultation + decreased breath sounds      Cardiovascular hypertension, Pt. on medications and Pt. on home beta blockers +CHF  Normal cardiovascular exam+ dysrhythmias Atrial Fibrillation  Rhythm:Regular Rate:Normal  NICM LVEF 35-40%  EKG 09/06/19 NSR, 1st deg AV Block, RBBB pattern  Echo 01/10/19 1. The left ventricle has mild-moderately reduced systolic function, with an ejection fraction of 40-45%. The cavity size was normal. Left ventricular diastolic Doppler parameters are consistent with impaired relaxation. Elevated mean left atrial pressure Left ventricular diffuse hypokinesis.  2. The right ventricle has normal systolic function. The cavity was  normal. There is No increase in right ventricular wall thickness.  3. Left atrial size was mildly dilated.  4. Right atrial size was normal.  5. Mild thickening of the mitral valve leaflet. No evidence of mitral valve stenosis.  6. The aortic valve is tricuspid. Mild thickening of the aortic valve. Mild calcification of the aortic valve. Aortic valve regurgitation is moderate by color flow Doppler.  7. Pulmonic valve regurgitation is moderate by color flow Doppler.  8. The aorta is abnormal unless otherwise noted.  9. There is dilatation of the ascending aorta measuring 49 mm.  10. The average left ventricular global longitudinal strain is -11.1 %  Cardiac Cath 06/28/19 1. Filling pressures are normal on current diuretic regimen.  2. Preserved cardiac output.   Thoracic Aortic  aneurysm- stable 5.1 cm Ascending Aorta   Neuro/Psych PSYCHIATRIC DISORDERS Anxiety Claustrophobia- extremeGlaucoma OD    GI/Hepatic Neg liver ROS, GERD  Medicated,Dysphagia   Endo/Other  diabetes, Poorly Controlled, Type 2, Oral Hypoglycemic AgentsObesity Hyperlipidemia  Renal/GU negative Renal ROS Bladder dysfunction  Urinary incontinence ED    Musculoskeletal  (+) Arthritis , Osteoarthritis,    Abdominal (+) + obese,   Peds  Hematology negative hematology ROS (+)   Anesthesia Other Findings   Reproductive/Obstetrics                             Anesthesia Physical Anesthesia Plan  ASA: III  Anesthesia Plan: MAC   Post-op Pain Management:    Induction: Intravenous  PONV Risk Score and Plan: 1 and Propofol infusion and Treatment may vary due to age or medical condition  Airway Management Planned: Natural Airway and Nasal Cannula  Additional Equipment:   Intra-op Plan:   Post-operative Plan:   Informed Consent: I have reviewed the patients History and Physical, chart, labs and discussed the procedure including the risks, benefits and alternatives for the proposed anesthesia with the patient or authorized representative who has indicated his/her understanding and acceptance.     Dental advisory given  Plan Discussed with: CRNA and Anesthesiologist  Anesthesia Plan Comments:         Anesthesia Quick Evaluation

## 2019-12-05 ENCOUNTER — Telehealth: Payer: Self-pay | Admitting: Cardiothoracic Surgery

## 2019-12-05 ENCOUNTER — Telehealth (INDEPENDENT_AMBULATORY_CARE_PROVIDER_SITE_OTHER): Payer: Medicare Other | Admitting: Cardiothoracic Surgery

## 2019-12-05 ENCOUNTER — Other Ambulatory Visit: Payer: Medicare Other

## 2019-12-05 DIAGNOSIS — I712 Thoracic aortic aneurysm, without rupture: Secondary | ICD-10-CM

## 2019-12-05 NOTE — Telephone Encounter (Signed)
[address]       Alan [name] [id number]             [phone]     CARDIOTHORACIC SURGERY TELEPHONE VIRTUAL OFFICE NOTE  Referring Provider is No ref. provider found Primary Cardiologist is [name], MD PCP is [facility]   HPI:  I spoke with Alan [name] (DOB Mar 02, 1941 ) via telephone on 12/05/2019 at 12:27 PM and verified that I was speaking with the correct person using more than one form of identification.  We discussed the reason(s) for conducting our visit virtually instead of in-person.  The patient expressed understanding the circumstances and agreed to proceed as described.  I called the patient to discuss the findings of his recent CTA of the thoracic aorta obtained earlier this summer.  He has an asymptomatic stable fusiform ascending aneurysm measuring 4.9 cm diameter.  It has been unchanged since 2018.  He has no associated chest pain and his blood pressure is under excellent control with oral Toprol-XL 12.5 mg twice daily  No recent cardiac problems. Earlier this week the patient underwent endoscopy and esophageal dilatation for some dysphagia.   Current Outpatient Medications  Medication Sig Dispense Refill  . Alogliptin Benzoate 25 MG TABS Take 12.5 mg by mouth daily.    Marland Kitchen aspirin EC 81 MG tablet Take 1 tablet (81 mg total) by mouth daily. 90 tablet 3  . brimonidine (ALPHAGAN) 0.2 % ophthalmic solution Place 1 drop into the right eye in the morning and at bedtime.     . calcium carbonate (TUMS - DOSED IN MG ELEMENTAL CALCIUM) 500 MG chewable tablet Chew 1 tablet by mouth daily as needed for indigestion or heartburn.    . Cholecalciferol (VITAMIN D) 2000 UNITS CAPS Take 2,000 Units by mouth at bedtime.     . clobetasol (TEMOVATE) 0.05 % external solution Apply 1 application topically daily as needed (Bump on back).     . docusate sodium (COLACE) 100 MG capsule Take 100 mg by mouth 2 (two) times daily.    .  dorzolamide-timolol (COSOPT) 22.3-6.8 MG/ML ophthalmic solution Place 1 drop into the right eye 2 (two) times daily.     Marland Kitchen glipiZIDE (GLUCOTROL) 10 MG tablet Take 5-10 mg by mouth See admin instructions. Take 10 mg in the morning and 5 mg at bedtime    . LANTUS SOLOSTAR 100 UNIT/ML Solostar Pen Inject 18 Units into the skin at bedtime.     Marland Kitchen latanoprost (XALATAN) 0.005 % ophthalmic solution Place 1 drop into the left eye at bedtime.     . meclizine (ANTIVERT) 25 MG tablet Take 25 mg by mouth in the morning and at bedtime.     . metFORMIN (GLUCOPHAGE) 500 MG tablet Take 500-1,000 mg by mouth See admin instructions. Take 1000 mg in the morning and 500 mg at bedtime    . metoprolol succinate (TOPROL-XL) 25 MG 24 hr tablet Take 12.5 mg by mouth 2 (two) times daily.    . Netarsudil-Latanoprost (ROCKLATAN) 0.02-0.005 % SOLN Place 1 drop into the right eye at bedtime.    . polyethylene glycol (MIRALAX / GLYCOLAX) 17 g packet Take 17 g by mouth daily as needed for moderate constipation.    . rosuvastatin (CRESTOR) 5 MG tablet Take 1 tablet (5 mg total) by mouth at bedtime. 30 tablet 11  . spironolactone (ALDACTONE) 25 MG tablet Take 1 tablet (25 mg total) by mouth daily. 30 tablet 0  .  torsemide (DEMADEX) 20 MG tablet Take 40 mg by mouth daily.    . traMADol (ULTRAM) 50 MG tablet Take 50 mg by mouth at bedtime.     . vitamin B-12 (CYANOCOBALAMIN) 500 MCG tablet Take 500 mcg by mouth in the morning and at bedtime.      No current facility-administered medications for this visit.     Diagnostic Tests:  CTA images personally reviewed showing a stable fusiform ascending thoracic aneurysm measuring 4.9 cm diameter.  Pulmonary windows show no at risk pulmonary nodules, no suspicious lymphadenopathy.   Impression:  Stable asymptomatic moderate fusiform ascending aneurysm.  Well-controlled blood pressure.  Patient is at low risk for dissection, less than 1-2%.  Best long-term therapy is continued blood  pressure control and surveillance scans.  Plan:  Patient will continue to control his blood pressure and monitor blood pressure. Discussed that the patient should avoid the antibiotic Levaquin Which can weaken the connective tissue in his aortic wall. I will see the patient back with scheduled CTA in 1 year.    I discussed limitations of evaluation and management via telephone.  The patient was advised to call back for repeat telephone consultation or to seek an in-person evaluation if questions arise or the patient's clinical condition changes in any significant manner.  I spent in excess of 5 minutes of non-face-to-face time during the conduct of this telephone virtual office consultation.  Level 1  (99441)             5-10 minutes Level 2  (99442)            11-20 minutes Level 3  (99443)            21-30 minutes   12/05/2019 12:27 PM

## 2019-12-07 ENCOUNTER — Other Ambulatory Visit: Payer: Self-pay

## 2019-12-07 ENCOUNTER — Telehealth: Payer: Self-pay

## 2019-12-07 ENCOUNTER — Encounter: Payer: Self-pay | Admitting: Cardiothoracic Surgery

## 2019-12-07 DIAGNOSIS — R1314 Dysphagia, pharyngoesophageal phase: Secondary | ICD-10-CM

## 2019-12-07 NOTE — Progress Notes (Signed)
PCP is [facility] Referring Provider is Eddie North, MD  No chief complaint on file.   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  Patient has well-controlled hypertension.  The patient is a non-smoker.  Patient's recent echo shows mild AI with a trileaflet aortic valve, EF 50-55%.  Patient denies chest pain presyncope or shortness of breath.   Past Medical History:  Diagnosis Date  . A-fib (HCC)   . AAA (abdominal aortic aneurysm) (HCC) 2018  . Arthritis   . CHF (congestive heart failure) (HCC)   . Chronic systolic (congestive) heart failure (HCC)   . Chronic systolic CHF (congestive heart failure) (HCC)   . Claustrophobia    EXTREME   . Diabetes mellitus ORAL MED  . Diverticulosis   . ED (erectile dysfunction)   . GAD (generalized anxiety disorder)   . GERD (gastroesophageal reflux disease)   . Glaucoma of right eye   . History of diverticulitis   . History of nuclear stress test    MV 1/18: EF 33, no ischemia; high risk  . History of prostate cancer   . Lung nodules   . Malfunction of artificial urethral sphincter (HCC)   . Malfunction of penile prosthesis (HCC)   . NICM (nonischemic cardiomyopathy) (HCC) 02/06/2015   LHC 11/14: EF 35, no significant CAD // Echo 7/16: Diff HK worse in inf base, EF 40%, Gr 1 DD, mild AI, mild MR, mod LAE // Echo 5/18: Moderate concentric LVH, EF 35-40, inferolateral and anterolateral AK, normal wall motion, moderate AI, moderate MR, moderate LAE, mild PI  . Nocturia   . Presbyesophagus 2018  . Snoring   . SUI (stress urinary incontinence), male   . Thoracic aortic aneurysm without rupture (HCC) 02/06/2015   Chest CTA 1/18:  5.1 cm ascending aortic aneurysm, mild cardiomegaly, mild coronary artery calcification, upper lobe small airway disease, cholelithiasis, 16 mm right thyroid nodule  . Thyroid nodule     Past Surgical History:  Procedure Laterality Date   . ANKLE SURGERY Bilateral   . BALLOON DILATION N/A 12/04/2019   Procedure: BALLOON DILATION;  Surgeon: Sherrilyn Rist, MD;  Location: [facility];  Service: Gastroenterology;  Laterality: N/A;  . BIOPSY THYROID    . COLONOSCOPY  2010  . ESOPHAGOGASTRODUODENOSCOPY N/A 12/04/2019   Procedure: ESOPHAGOGASTRODUODENOSCOPY (EGD);  Surgeon: Sherrilyn Rist, MD;  Location: [facility];  Service: Gastroenterology;  Laterality: N/A;  . I & D/ ORIF RIGHT INDEX FINGER  01-19-2005  . INSERTION INFLATABLE PENILE PROSTHESIS  06-22-1999   MENTOR ALPHA I  . IR GENERIC HISTORICAL  06/17/2016   IR RADIOLOGIST EVAL & MGMT 06/17/2016 Jolaine Click, MD [facility] INTERV RAD  . LEFT HEART CATHETERIZATION WITH CORONARY ANGIOGRAM N/A 03/15/2013   Procedure: LEFT HEART CATHETERIZATION WITH CORONARY ANGIOGRAM;  Surgeon: [name], MD;  Location: [facility] CATH LAB;  Service: Cardiovascular;  Laterality: N/A;  . PENILE PROSTHESIS IMPLANT  09/24/2011   Procedure: PENILE PROTHESIS INFLATABLE;  Surgeon: [name], MD;  Location: [facility];  Service: Urology;  Laterality: N/A;  REPLACEMENT OF INFLATABLE PENILE PROTHESIS (COLOPLAST)    . PLACEMENT ARTIFICIAL URINARY SPHINTER  01-20-2001   [id number]  . REMOVAL AND REPLACEMENT GU SPHINTER CUFF  05-15-2004   STRESS URINARY INCONTINENCE  . RIGHT HEART CATH N/A 06/28/2019   Procedure: RIGHT HEART CATH;  Surgeon: Laurey Morale, MD;  Location: [facility] INV[facility]IVE CV LAB;  Service: Cardiovascular;  Laterality: N/A;    Family History  Problem Relation Age of Onset  . Heart Problems Mother   . Heart attack Father   . Healthy Sister        NO HEART ISSUES  . Healthy Sister        NO HEART ISSUES    Social History Social History   Tobacco Use  . Smoking status: Never Smoker  . Smokeless tobacco: Never Used  Vaping Use  . Vaping Use: Never used  Substance Use Topics  . Alcohol use: No  . Drug use: No    Current Outpatient Medications  Medication Sig  Dispense Refill  . Alogliptin Benzoate 25 MG TABS Take 12.5 mg by mouth daily.    Marland Kitchen aspirin EC 81 MG tablet Take 1 tablet (81 mg total) by mouth daily. 90 tablet 3  . brimonidine (ALPHAGAN) 0.2 % ophthalmic solution Place 1 drop into the right eye in the morning and at bedtime.     . calcium carbonate (TUMS - DOSED IN MG ELEMENTAL CALCIUM) 500 MG chewable tablet Chew 1 tablet by mouth daily as needed for indigestion or heartburn.    . Cholecalciferol (VITAMIN D) 2000 UNITS CAPS Take 2,000 Units by mouth at bedtime.     . clobetasol (TEMOVATE) 0.05 % external solution Apply 1 application topically daily as needed (Bump on back).     . docusate sodium (COLACE) 100 MG capsule Take 100 mg by mouth 2 (two) times daily.    . dorzolamide-timolol (COSOPT) 22.3-6.8 MG/ML ophthalmic solution Place 1 drop into the right eye 2 (two) times daily.     Marland Kitchen glipiZIDE (GLUCOTROL) 10 MG tablet Take 5-10 mg by mouth See admin instructions. Take 10 mg in the morning and 5 mg at bedtime    . LANTUS SOLOSTAR 100 UNIT/ML Solostar Pen Inject 18 Units into the skin at bedtime.     Marland Kitchen latanoprost (XALATAN) 0.005 % ophthalmic solution Place 1 drop into the left eye at bedtime.     . meclizine (ANTIVERT) 25 MG tablet Take 25 mg by mouth in the morning and at bedtime.     . metFORMIN (GLUCOPHAGE) 500 MG tablet Take 500-1,000 mg by mouth See admin instructions. Take 1000 mg in the morning and 500 mg at bedtime    . metoprolol succinate (TOPROL-XL) 25 MG 24 hr tablet Take 12.5 mg by mouth 2 (two) times daily.    . Netarsudil-Latanoprost (ROCKLATAN) 0.02-0.005 % SOLN Place 1 drop into the right eye at bedtime.    . polyethylene glycol (MIRALAX / GLYCOLAX) 17 g packet Take 17 g by mouth daily as needed for moderate constipation.    . rosuvastatin (CRESTOR) 5 MG tablet Take 1 tablet (5 mg total) by mouth at bedtime. 30 tablet 11  . spironolactone (ALDACTONE) 25 MG tablet Take 1 tablet (25 mg total) by mouth daily. 30 tablet 0  .  torsemide (DEMADEX) 20 MG tablet Take 40 mg by mouth daily.    . traMADol (ULTRAM) 50 MG tablet Take 50 mg by mouth at bedtime.     . vitamin B-12 (CYANOCOBALAMIN) 500 MCG tablet Take 500 mcg by mouth in the morning and at bedtime.      No current facility-administered medications for this visit.    Allergies  Allergen Reactions  . Ezetimibe Other (See Comments)    Confusion  . Statins Other (See Comments)    Myalgias  . Lacosamide Rash  . Other     Pt is extremely  claustrophobic, will have a severe panic attack if he is left alone in a small space including an empty hospital room. Especially after waking up from surgery.   . Pravastatin Palpitations  . Ropinirole     Nightmares     Review of Systems   Patient has had previous successful surgery for prostate cancer. He is retired. He tries to avoid heavy lifting. He is not taking blood thinners. He denies palpitations or A. Fib. He does not check his blood pressure at home but he is encouraged to do so.   There were no vitals taken for this visit. Physical Exam      Exam    General- alert and comfortable    Neck- no JVD, no cervical adenopathy palpable, no carotid bruit   Lungs- clear without rales, wheezes   Cor- regular rate and rhythm, no murmur , gallop   Abdomen- soft, non-tender   Extremities - warm, non-tender, minimal edema   Neuro- oriented, appropriate, no focal weakness   Diagnostic Tests: CTA images performed at outside facility reviewed on disc provided by the patient. The ascending aorta remains at 4.8 cm.  There is no mural thickening hematoma or ulceration.  Patient understands that his abdominal scan also showed the presence of gallstones.  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  Current risk for dissection is less than 1%. Well-controlled hypertension History of prostate cancer History of nonischemic cardiomyopathy now improved  Plan: Patient return with  surveillance CTA in 1 year.  He will try to measure his blood pressure once a week and keep records for his primary care physician.  The goal is to keep systolic pressure that less than 150.  Mikey Bussing, MD [facility] [phone]

## 2019-12-07 NOTE — Telephone Encounter (Signed)
Sherrilyn Rist, MD  Chrystie Nose, RN Bonita Quin,  This patient had an EGD with me me today at [facility] for dysphagia.  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   Please arrange an esophageal manometry and an office visit to see me about 3 weeks later (giving VN time to read the study)   You will want to talk with his caregiver Sherol. She brings him to visits and manages his care.   - HD        Pt scheduled for covid screen 02/16/20@10 :30am, EM scheduled at [facility] 02/20/20@12 :30pm. Attempted to call Sherol and could not leave a message due to VM being full. Will try again.

## 2019-12-07 NOTE — Telephone Encounter (Signed)
-----   Message from Sherrilyn Rist, MD sent at 12/04/2019 11:48 AM EDT ----- Bonita Quin, This patient had an EGD with me me today at [facility] for dysphagia. 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  Please arrange an esophageal manometry and an office visit to see me about 3 weeks later (giving VN time to read the study)  You will want to talk with his caregiver Sherol.  She brings him to visits and manages his care.  - HD

## 2019-12-12 NOTE — Telephone Encounter (Signed)
Pt was going to be out of town until 02/23/20, appts rescheduled to covid screen on 03/01/20@10 :30am, EM rescheduled at [facility] for 11/10@12 :30pm. Pts caregiver aware of appts.

## 2020-01-08 ENCOUNTER — Other Ambulatory Visit: Payer: Self-pay

## 2020-01-08 ENCOUNTER — Ambulatory Visit (HOSPITAL_COMMUNITY)
Admission: RE | Admit: 2020-01-08 | Discharge: 2020-01-08 | Disposition: A | Discharge status: Home / Self Care | Payer: Medicare Other | Source: Ambulatory Visit | Attending: Cardiology | Admitting: Cardiology

## 2020-01-08 VITALS — BP 100/60 | HR 75 | Wt 239.4 lb

## 2020-01-08 DIAGNOSIS — E1122 Type 2 diabetes mellitus with diabetic chronic kidney disease: Secondary | ICD-10-CM | POA: Diagnosis not present

## 2020-01-08 DIAGNOSIS — Z794 Long term (current) use of insulin: Secondary | ICD-10-CM | POA: Insufficient documentation

## 2020-01-08 DIAGNOSIS — Z79899 Other long term (current) drug therapy: Secondary | ICD-10-CM | POA: Diagnosis not present

## 2020-01-08 DIAGNOSIS — N183 Chronic kidney disease, stage 3 unspecified: Secondary | ICD-10-CM | POA: Insufficient documentation

## 2020-01-08 DIAGNOSIS — I480 Paroxysmal atrial fibrillation: Secondary | ICD-10-CM | POA: Diagnosis not present

## 2020-01-08 DIAGNOSIS — I428 Other cardiomyopathies: Secondary | ICD-10-CM | POA: Diagnosis not present

## 2020-01-08 DIAGNOSIS — I5022 Chronic systolic (congestive) heart failure: Secondary | ICD-10-CM | POA: Insufficient documentation

## 2020-01-08 DIAGNOSIS — Z8249 Family history of ischemic heart disease and other diseases of the circulatory system: Secondary | ICD-10-CM | POA: Insufficient documentation

## 2020-01-08 DIAGNOSIS — W19XXXD Unspecified fall, subsequent encounter: Secondary | ICD-10-CM | POA: Diagnosis not present

## 2020-01-08 DIAGNOSIS — R296 Repeated falls: Secondary | ICD-10-CM | POA: Diagnosis not present

## 2020-01-08 DIAGNOSIS — I712 Thoracic aortic aneurysm, without rupture: Secondary | ICD-10-CM | POA: Insufficient documentation

## 2020-01-08 DIAGNOSIS — S066X9D Traumatic subarachnoid hemorrhage with loss of consciousness of unspecified duration, subsequent encounter: Secondary | ICD-10-CM | POA: Diagnosis not present

## 2020-01-08 DIAGNOSIS — R0602 Shortness of breath: Secondary | ICD-10-CM | POA: Diagnosis not present

## 2020-01-08 DIAGNOSIS — Z7982 Long term (current) use of aspirin: Secondary | ICD-10-CM | POA: Diagnosis not present

## 2020-01-08 LAB — BASIC METABOLIC PANEL
Anion gap: 8 (ref 5–15)
BUN: 19 mg/dL (ref 8–23)
CO2: 23 mmol/L (ref 22–32)
Calcium: 9.1 mg/dL (ref 8.9–10.3)
Chloride: 105 mmol/L (ref 98–111)
Creatinine, Ser: 1.18 mg/dL (ref 0.61–1.24)
GFR calc Af Amer: >60 mL/min (ref >60)
GFR calc non Af Amer: 59 mL/min — ABNORMAL LOW (ref >60)
Glucose, Bld: 252 mg/dL — ABNORMAL HIGH (ref 70–99)
Potassium: 4.7 mmol/L (ref 3.5–5.1)
Sodium: 136 mmol/L (ref 135–145)

## 2020-01-08 MED ORDER — EMPAGLIFLOZIN 10 MG PO TABS
10.0000 mg | ORAL_TABLET | Freq: Every day | ORAL | 6 refills | Status: DC
Start: 2020-01-08 — End: 2020-01-08

## 2020-01-08 MED ORDER — EMPAGLIFLOZIN 10 MG PO TABS: 10.0000 mg | ORAL_TABLET | Freq: Every day | ORAL | 0 refills | Status: DC

## 2020-01-08 NOTE — Patient Instructions (Addendum)
Start Jardiance 10 mg Daily  Labs done today, your results will be available in MyChart, we will contact you for abnormal readings.  Your physician recommends that you return for lab work in: 10 days  Please complete your home sleep study  Your physician recommends that you schedule a follow-up appointment in: 1 month  If you have any questions or concerns before your next appointment please send [place] a message through [place] or call our office at [phone].    TO LEAVE A MESSAGE FOR THE NURSE SELECT OPTION 2, PLEASE LEAVE A MESSAGE INCLUDING: . YOUR NAME . DATE OF BIRTH . CALL BACK NUMBER . REASON FOR CALL**this is important as we prioritize the call backs  YOU WILL RECEIVE A CALL BACK THE SAME DAY AS LONG AS YOU CALL BEFORE 4:00 PM  At the [facility], you and your health needs are our priority. As part of our continuing mission to provide you with exceptional heart care, we have created designated Provider Care Teams. These Care Teams include your primary Cardiologist (physician) and Advanced Practice Providers (APPs- Physician Assistants and Nurse Practitioners) who all work together to provide you with the care you need, when you need it.   You may see any of the following providers on your designated Care Team at your next follow up: Marland Kitchen Dr Arvilla Meres . Dr Marca Ancona . Tonye Becket, NP . Robbie Lis, PA . Karle Plumber, PharmD   Please be sure to bring in all your medications bottles to every appointment.

## 2020-01-09 NOTE — Progress Notes (Signed)
ID:  Alan [name], DOB [birth year], MRN [id number]   Provider location: [place] [facility] Type of Visit: Established patient   PCP:  [facility]  Cardiologist:  [name], MD Primary HF: Dr. Shirlee Latch   History of Present Illness: Alan [name] is a 79 y.o. male who has a history of diabetes, CKD stage 3, thoracic aortic aneurysm, and chronic systolic CHF.  Patient has a long history of CHF, diagnosed around 2014.  Cath in 2014 showed no significant CAD.  Over the years, his EF has ranged from 35-45%, never markedly low.  Echo from 9/20 showed EF 40-45% with normal RV function.    For several years, he has had gradually worsening exertional dyspnea and decreased exercise tolerance.  He sleeps very poor due to orthopnea and PND.  He is very sleepy during the day.  He is short of breath walking around the house, walking to the mailbox, and walking up any incline.  No chest pain.   He has had a history of frequent falls, his girlfriend says that he seems to fall asleep standing at times and will fall (does not pass out).  In 1/21, he fell and hit his head.  He was found to have a traumatic subarachnoid hemorrhage and a left radial fracture.  Aspirin was stopped.  He was noted to be in atrial fibrillation during this admission.  He was not anticoagulated due to falls and SAH.  Echo at [facility] in 1/21 showed EF 45-50% with severe LV dilation, moderate LVH, mild-moderate MR, mild-moderate AI.   Patient had RHC in 3/21, showing optimized filling pressures and normal cardiac index.  PYP scan in 3/21 was not suggestive of TTR amyloidosis.    Patient returns for followup of CHF.  He continues to have orthopnea, this has been chronic.  He sleeps very poorly in general.  Daytime sleepiness.  Very anxious in general.  Short of breath walking to the mailbox, ok walking around the house.  No chest pain.  No further falls, occasional lightheadedness if he stands too fast  but not as marked as in the past.  Weight is up.   Labs (12/20): NT-proBNP 975, hgb 13, K 4.9, creatinine 1.6 Labs (1/21): K 4.3, creatinine 1.5, BNP 353 Labs (2/21): K 4.4, creatinine 1.35 => 1.29, hgb 11.5, myeloma panel negative, urine immunofixation negative Labs (3/21): K 4.4, creatinine 1.3 Labs (5/21): LDL 44, K 4.4, creatinine 3.27  ECG (personally reviewed): NSR, 1st degree AVB, LAFB, septal Qs  PMH: 1. Type II diabetes with gastroparesis 2. Thoracic aortic aneurysm: - CTA chest 1/18 with 5.1 cm ascending aorta.  - CTA chest 6/19 with 4.9 cm ascending aorta - CTA chest 5/21 with 4.9 cm ascending aorta  3. Chronic systolic CHF: Nonischemic cardiomyopathy.  - LHC (11/14): No significant CAD.  - Echo (7/16): EF 40% - Echo (5/18): EF 35-40% - Echo (9/20): EF 40-45%, normal RV.  - Echo (1/21, [facility]): EF 45-50%, moderate LVH, mild-moderate MR, mild-moderate AI.   - RHC (3/21): mean RA 1, PA 39/7, mean PCWP 14, CI 2.53 - PYP scan (3/21): grade 2, H/CL 1.2 => unlikely to be TTR amyloidosis.  4. CKD: Stage 3 5. Traumatic subarachnoid hemorrhage (12/20).  6. Atrial fibrillation: Paroxysmal.  7. C-spine arthritis 8. PTSD   Social History   Socioeconomic History  . Marital status: Divorced    Spouse name: Not on file  . Number of children: Not on file  . Years of education:  Not on file  . Highest education level: Not on file  Occupational History  . Not on file  Tobacco Use  . Smoking status: Never Smoker  . Smokeless tobacco: Never Used  Vaping Use  . Vaping Use: Never used  Substance and Sexual Activity  . Alcohol use: No  . Drug use: No  . Sexual activity: Not on file  Other Topics Concern  . Not on file  Social History Narrative  . Not on file   Social Determinants of Health   Financial Resource Strain:   . Difficulty of Paying Living Expenses: Not on file  Food Insecurity:   . Worried About Programme researcher, broadcasting/film/video in the Last Year: Not on file  . Ran Out of  Food in the Last Year: Not on file  Transportation Needs:   . Lack of Transportation (Medical): Not on file  . Lack of Transportation (Non-Medical): Not on file  Physical Activity:   . Days of Exercise per Week: Not on file  . Minutes of Exercise per Session: Not on file  Stress:   . Feeling of Stress : Not on file  Social Connections:   . Frequency of Communication with Friends and Family: Not on file  . Frequency of Social Gatherings with Friends and Family: Not on file  . Attends Religious Services: Not on file  . Active Member of Clubs or Organizations: Not on file  . Attends Banker Meetings: Not on file  . Marital Status: Not on file  Intimate Partner Violence:   . Fear of Current or Ex-Partner: Not on file  . Emotionally Abused: Not on file  . Physically Abused: Not on file  . Sexually Abused: Not on file   Family History  Problem Relation Age of Onset  . Heart Problems Mother   . Heart attack Father   . Healthy Sister        NO HEART ISSUES  . Healthy Sister        NO HEART ISSUES   ROS: All systems reviewed and negative except as per HPI.   Current Outpatient Medications  Medication Sig Dispense Refill  . Alogliptin Benzoate 25 MG TABS Take 12.5 mg by mouth daily.    Marland Kitchen aspirin EC 81 MG tablet Take 1 tablet (81 mg total) by mouth daily. 90 tablet 3  . brimonidine (ALPHAGAN) 0.2 % ophthalmic solution Place 1 drop into the right eye in the morning and at bedtime.     . calcium carbonate (TUMS - DOSED IN MG ELEMENTAL CALCIUM) 500 MG chewable tablet Chew 1 tablet by mouth daily as needed for indigestion or heartburn.    . Cholecalciferol (VITAMIN D) 2000 UNITS CAPS Take 2,000 Units by mouth at bedtime.     . clobetasol (TEMOVATE) 0.05 % external solution Apply 1 application topically daily as needed (Bump on back).     . docusate sodium (COLACE) 100 MG capsule Take 100 mg by mouth 2 (two) times daily.    . dorzolamide-timolol (COSOPT) 22.3-6.8 MG/ML  ophthalmic solution Place 1 drop into the right eye 2 (two) times daily.     Marland Kitchen glipiZIDE (GLUCOTROL) 10 MG tablet Take 5-10 mg by mouth See admin instructions. Take 10 mg in the morning and 5 mg at bedtime    . LANTUS SOLOSTAR 100 UNIT/ML Solostar Pen Inject 18 Units into the skin at bedtime.     Marland Kitchen latanoprost (XALATAN) 0.005 % ophthalmic solution Place 1 drop into the left eye at  bedtime.     . meclizine (ANTIVERT) 25 MG tablet Take 25 mg by mouth in the morning and at bedtime.     . metFORMIN (GLUCOPHAGE) 500 MG tablet Take 500-1,000 mg by mouth See admin instructions. Take 1000 mg in the morning and 500 mg at bedtime    . methocarbamol (ROBAXIN) 500 MG tablet Take 500 mg by mouth 2 (two) times daily.    . metoprolol succinate (TOPROL-XL) 25 MG 24 hr tablet Take 12.5 mg by mouth 2 (two) times daily.    . Netarsudil-Latanoprost (ROCKLATAN) 0.02-0.005 % SOLN Place 1 drop into the right eye at bedtime.    . polyethylene glycol (MIRALAX / GLYCOLAX) 17 g packet Take 17 g by mouth daily as needed for moderate constipation.    . rosuvastatin (CRESTOR) 5 MG tablet Take 1 tablet (5 mg total) by mouth at bedtime. 30 tablet 11  . spironolactone (ALDACTONE) 25 MG tablet Take 1 tablet (25 mg total) by mouth daily. 30 tablet 0  . torsemide (DEMADEX) 20 MG tablet Take 40 mg by mouth daily.    . traMADol (ULTRAM) 50 MG tablet Take 50 mg by mouth at bedtime.     . vitamin B-12 (CYANOCOBALAMIN) 500 MCG tablet Take 500 mcg by mouth in the morning and at bedtime.     . empagliflozin (JARDIANCE) 10 MG TABS tablet Take 1 tablet (10 mg total) by mouth daily before breakfast. 14 tablet 0   No current facility-administered medications for this encounter.   Exam:   BP 100/60   Pulse 75   Wt 108.6 kg (239 lb 6.4 oz)   SpO2 96%   BMI 34.35 kg/m  General: NAD Neck: JVP 8 cm with HJR, no thyromegaly or thyroid nodule.  Lungs: Clear to auscultation bilaterally with normal respiratory effort. CV: Nondisplaced PMI.   Heart regular S1/S2, no S3/S4, no murmur.  1+ ankle edema.  No carotid bruit.  Normal pedal pulses.  Abdomen: Soft, nontender, no hepatosplenomegaly, no distention.  Skin: Intact without lesions or rashes.  Neurologic: Alert and oriented x 3.  Psych: Normal affect. Extremities: No clubbing or cyanosis.  HEENT: Normal.   Assessment/Plan: 1. Chronic systolic CHF: Nonischemic cardiomyopathy by cath in 2014.  Over the years, EF has ranged 35-45%, echo in 9/20 with EF 40-45%. Most recent echo at [facility] in 1/21 was reported to have EF 45-50%. RHC in 3/21 showed normal filling pressures suggesting good diuresis and preserved cardiac output.  PYP scan was not suggestive of cardiac amyloidosis.  Mild volume overload on exam.  Symptoms stable, NYHA class II-III.  Medication titration has been limited by orthostasis. - I was planning to start him on Entresto with low EF, but will hold off for now with h/o falls, concern for orthostasis.    - Continue Toprol XL 12.5 mg bid.  - With mild volume overload, I will start him on Farxiga 10 mg daily. BMET today and in 10 days.  - Continue torsemide 40 mg daily. - Continue spironolactone 25 mg daily.   2. CKD: Stage 3.  Follow creatinine closely with diuresis, BMET today.  3. Suspect OSA: home sleep study, he has this at home but needs to use it.  4. Diabetes: Reasonable control recently.  - As above, add dapagliflozin.  - He is on Crestor, good lipids in 5/21.  5. Ascending aortic aneurysm: 5/21 CTA showed 4.9 cm ascending aorta.  - Follow yearly.  6. Atrial fibrillation: Paroxysmal.  Noted in the hospital when he  had SAH.  He is in NSR today. Anticoagulation was not started due to frequent falls and SAH.  - He is on ASA 81 daily.   Followup in 1 month with NP/PA to reassess volume.   Signed, Marca Ancona, MD  01/09/2020  [facility] [address] [facility] [place] [address] [phone]  (office) [phone] (fax)

## 2020-01-15 ENCOUNTER — Other Ambulatory Visit: Payer: Self-pay

## 2020-01-15 ENCOUNTER — Encounter: Payer: Self-pay | Admitting: Physician Assistant

## 2020-01-15 ENCOUNTER — Ambulatory Visit (INDEPENDENT_AMBULATORY_CARE_PROVIDER_SITE_OTHER): Payer: Medicare Other | Admitting: Physician Assistant

## 2020-01-15 VITALS — BP 110/40 | HR 68 | Ht 70.0 in | Wt 236.0 lb

## 2020-01-15 DIAGNOSIS — I5042 Chronic combined systolic (congestive) and diastolic (congestive) heart failure: Secondary | ICD-10-CM | POA: Diagnosis not present

## 2020-01-15 NOTE — Progress Notes (Signed)
Cardiology Office Note:    Date:  01/15/2020   ID:  Alan [name], DOB Aug 05, 1940, MRN [id number]  PCP:  Clinic, [place] Cardiologist:  Sherren Mocha, MD   [facility] Electrophysiologist:  None   Referring MD: [facility], [place]   Chief Complaint:  Follow-up (CHF)    Patient Profile:    Alan [name] is a 79 y.o. male with:   Chronic combined systolic and diastolic CHF ? Non-ischemic cardiomyopathy   Cath 2014: no significant CAD   Myoview 04/2016: no ischemia  ? EF 35-40 (2018) ? EF 50-55 (Echocardiogram 07/2017) ? Echo 01/14/2019: EF 40-45, GR 1 DD ? Echo 05/16/2019 ([facility]): Moderate LVH, EF 45-50 ? Followed in CHF clinic; Dr. Aundra Dubin  PYP scan pending; myeloma panel and urine immunofixation negative; sleep study pending  Right heart cath 06/2019: Normal filling pressures  Paroxysmal atrial fibrillation ? Noted during admission 04/2019 with subarachnoid hemorrhage; anticoagulation not started  Ascending thoracic aortic aneurysm (followed by Dr. Prescott Gum) ? CT 6/19: 4.9 cm  ? CT 08/2018: 4.8 x 4.6 cm ? CT 5/21: 4.9 cm    Aortic insufficiency  ? Echo 12/2018: Moderate AI ? Echo 04/2019: Mild to moderate AI, mild to moderate MR  Diabetes mellitus   Chronic kidney disease stage III  Hypertension   Prostate CA  Thyroid nodule  Status post fall 04/2019: Traumatic subarachnoid hemorrhage  Prior CV studies: Chest CTA 09/18/19 Ascending TAA 4.9 cm   PYP Scan 07/19/19 Equivocal for TTR  R cardiac catheterization 06/28/2019 Mean RA 1, RV 33/3, mean PA 22, PCWP mean 14; CO 5.51, CI 2.53 Normal filling pressures; preserved cardiac output  Echocardiogram 05/22/2019 ([facility]) Mod LVH, EF 45-50, mild to mod AI, mild to mod MR  Echocardiogram 01/10/2019 EF 40-45, diff HK, Gr 1 DD, normal RVSF, mild LAE, mod AI, GLS -11.1%  Chest/Abd/Pelvic CTA 08/2018 ([facility]) 4.8 x 4.6 cm TAA  Chest CTA 10/12/2017 Stable  4.9 cm ascending thoracic aortic aneurysm.   Echo 08/15/2017 Moderate LVH, EF 50-55, global HK, grade 1 diastolic dysfunction, mild AI, dilated ascending aorta (4.3 cm), MAC, mild MR, mild LAE  Echo 09/09/16 Moderate concentric LVH, EF 35-40,inferolateral and anterolateral AK, normal wall motion, moderate AI, moderate MR, moderate LAE, mild PI  Myoview 05/11/16 No ischemia, EF 33, high risk  Echo 7/16 Diff HK worse in inf base, EF 40%, Gr 1 DD, mild AI, mild MR, mod LAE  LHC 11/14 LM: minor irregularity. LAD: diffuse irregularities but no significant stenoses throughout the course of the LAD EF 35%.  History of Present Illness:    Alan [name] was last seen by me in 3/21.  He has seen Dr. Aundra Dubin in the [facility] since then.  He was last seen 01/08/20.  He was started on Empagliflozin.  He is here today with his wife.  He has not received empagliflozin yet from the [place].  He notes significant issues with acid reflux.  He has to sleep on an incline due to shortness of breath.  His weights at home have been stable.  He has not had chest pain.  He has not had syncope.  He has significant issues with sleep which makes him unsteady on his feet throughout the day.  Past Medical History:  Diagnosis Date  . A-fib ([place])   . AAA (abdominal aortic aneurysm) ([place]) 2018  . Arthritis   . CHF (congestive heart failure) ([place])   . Chronic systolic (congestive) heart failure ([place])   .  Chronic systolic CHF (congestive heart failure) ([place])   . Claustrophobia    EXTREME   . Diabetes mellitus ORAL MED  . Diverticulosis   . ED (erectile dysfunction)   . GAD (generalized anxiety disorder)   . GERD (gastroesophageal reflux disease)   . Glaucoma of right eye   . History of diverticulitis   . History of nuclear stress test    MV 1/18: EF 33, no ischemia; high risk  . History of prostate cancer   . Lung nodules   . Malfunction of artificial urethral sphincter ([place])   . Malfunction of penile  prosthesis ([place])   . NICM (nonischemic cardiomyopathy) ([place]) 02/06/2015   LHC 11/14: EF 35, no significant CAD // Echo 7/16: Diff HK worse in inf base, EF 40%, Gr 1 DD, mild AI, mild MR, mod LAE // Echo 5/18: Moderate concentric LVH, EF 35-40, inferolateral and anterolateral AK, normal wall motion, moderate AI, moderate MR, moderate LAE, mild PI  . Nocturia   . Presbyesophagus 2018  . Snoring   . SUI (stress urinary incontinence), male   . Thoracic aortic aneurysm without rupture ([place]) 02/06/2015   Chest CTA 1/18:  5.1 cm ascending aortic aneurysm, mild cardiomegaly, mild coronary artery calcification, upper lobe small airway disease, cholelithiasis, 16 mm right thyroid nodule  . Thyroid nodule     Current Medications: Current Meds  Medication Sig  . Alogliptin Benzoate 25 MG TABS Take 12.5 mg by mouth daily.  Marland Kitchen aspirin EC 81 MG tablet Take 1 tablet (81 mg total) by mouth daily.  . brimonidine (ALPHAGAN) 0.2 % ophthalmic solution Place 1 drop into the right eye in the morning and at bedtime.   . calcium carbonate (TUMS - DOSED IN MG ELEMENTAL CALCIUM) 500 MG chewable tablet Chew 1 tablet by mouth daily as needed for indigestion or heartburn.  . Cholecalciferol (VITAMIN D) 2000 UNITS CAPS Take 2,000 Units by mouth at bedtime.   . clobetasol (TEMOVATE) 0.05 % external solution Apply 1 application topically daily as needed (Bump on back).   . docusate sodium (COLACE) 100 MG capsule Take 100 mg by mouth 2 (two) times daily.  . dorzolamide-timolol (COSOPT) 22.3-6.8 MG/ML ophthalmic solution Place 1 drop into the right eye 2 (two) times daily.   . empagliflozin (JARDIANCE) 10 MG TABS tablet Take 1 tablet (10 mg total) by mouth daily before breakfast.  . glipiZIDE (GLUCOTROL) 10 MG tablet Take 5-10 mg by mouth See admin instructions. Take 10 mg in the morning and 5 mg at bedtime  . LANTUS SOLOSTAR 100 UNIT/ML Solostar Pen Inject 18 Units into the skin at bedtime.   Marland Kitchen latanoprost (XALATAN) 0.005 %  ophthalmic solution Place 1 drop into the left eye at bedtime.   . meclizine (ANTIVERT) 25 MG tablet Take 25 mg by mouth in the morning and at bedtime.   . metFORMIN (GLUCOPHAGE) 500 MG tablet Take 500-1,000 mg by mouth See admin instructions. Take 1000 mg in the morning and 500 mg at bedtime  . methocarbamol (ROBAXIN) 500 MG tablet Take 500 mg by mouth every 6 (six) hours as needed for muscle spasms.  . metoprolol succinate (TOPROL-XL) 25 MG 24 hr tablet Take 12.5 mg by mouth 2 (two) times daily.  . Netarsudil-Latanoprost (ROCKLATAN) 0.02-0.005 % SOLN Place 1 drop into the right eye at bedtime.  . polyethylene glycol (MIRALAX / GLYCOLAX) 17 g packet Take 17 g by mouth daily as needed for moderate constipation.  . rosuvastatin (CRESTOR) 5 MG tablet Take 1  tablet (5 mg total) by mouth at bedtime.  Marland Kitchen spironolactone (ALDACTONE) 25 MG tablet Take 1 tablet (25 mg total) by mouth daily.  Marland Kitchen torsemide (DEMADEX) 20 MG tablet Take 40 mg by mouth daily.  . traMADol (ULTRAM) 50 MG tablet Take 50 mg by mouth at bedtime.   . vitamin B-12 (CYANOCOBALAMIN) 500 MCG tablet Take 500 mcg by mouth in the morning and at bedtime.      Allergies:   Ezetimibe, Statins, Lacosamide, Other, Pravastatin, and Ropinirole   Social History   Tobacco Use  . Smoking status: Never Smoker  . Smokeless tobacco: Never Used  Vaping Use  . Vaping Use: Never used  Substance Use Topics  . Alcohol use: No  . Drug use: No     Family Hx: The patient's family history includes Healthy in his sister and sister; Heart Problems in his mother; Heart attack in his father.  ROS   EKGs/Labs/Other Test Reviewed:    EKG:  EKG is not ordered today.  The ekg ordered today demonstrates n/a  Recent Labs: 04/18/2019: NT-Pro BNP 975 05/09/2019: B Natriuretic Peptide 352.7 06/20/2019: Platelets 302 06/28/2019: Hemoglobin 11.6; Hemoglobin 11.2 09/07/2019: ALT 14 01/08/2020: BUN 19; Creatinine, Ser 1.18; Potassium 4.7; Sodium 136   Recent  Lipid Panel Lab Results  Component Value Date/Time   CHOL 110 09/07/2019 02:38 PM   TRIG 141 09/07/2019 02:38 PM   HDL 38 (L) 09/07/2019 02:38 PM   CHOLHDL 2.9 09/07/2019 02:38 PM   LDLCALC 44 09/07/2019 02:38 PM    Physical Exam:    VS:  BP (!) 110/40   Pulse 68   Ht _0  (1.778 m)   Wt 236 lb (107 kg)   SpO2 95%   BMI 33.86 kg/m     Wt Readings from Last 3 Encounters:  01/15/20 236 lb (107 kg)  01/08/20 239 lb 6.4 oz (108.6 kg)  12/03/19 226 lb (102.5 kg)     Constitutional:      Appearance: Healthy appearance. Not in distress.  Neck:     Vascular: No JVR.  Pulmonary:     Effort: Pulmonary effort is normal.     Breath sounds: No wheezing. No rales.  Cardiovascular:     Normal rate. Regular rhythm. Normal S1. Normal S2.     Murmurs: There is no murmur.  Edema:    Peripheral edema absent.  Abdominal:     Palpations: Abdomen is soft.  Skin:    General: Skin is warm and dry.  Neurological:     General: No focal deficit present.     Mental Status: Alert and oriented to person, place and time.     Cranial Nerves: Cranial nerves are intact.       ASSESSMENT & PLAN:    1. Chronic combined systolic and diastolic heart failure (HCC) EF 45-50 by echocardiogram in 04/2019.  He is mainly followed in the advanced heart failure clinic by Dr. Aundra Dubin.  Overall, his volume status seems to be stable.  Medication titration is limited by low blood pressure.  He was recently placed on empagliflozin to manage his heart failure and diabetes.  He has not received this yet from the [place].  No changes will be made today.  He will continue close follow-up with the heart failure clinic as directed.  I will make sure he has follow-up here with Dr. Burt Knack or me in 1 year.  He can return sooner if he is released from the heart failure clinic earlier.  Dispo:  Return in about 1 year (around 01/14/2021) for Routine Follow Up, w/ Dr. Burt Knack, in person.   Medication Adjustments/Labs and Tests  Ordered: Current medicines are reviewed at length with the patient today.  Concerns regarding medicines are outlined above.  Tests Ordered: No orders of the defined types were placed in this encounter.  Medication Changes: No orders of the defined types were placed in this encounter.   Signed, Richardson Dopp, PA-C  01/15/2020 4:03 PM    [facility] [address], [place], [place]  [address] Phone: [phone]; Fax: [phone]

## 2020-01-15 NOTE — Patient Instructions (Signed)
Medication Instructions:  Your physician recommends that you continue on your current medications as directed. Please refer to the Current Medication list given to you today.  *If you need a refill on your cardiac medications before your next appointment, please call your pharmacy*  Lab Work: None ordered today  Testing/Procedures: None ordered today  Follow-Up: At [facility], you and your health needs are our priority.  As part of our continuing mission to provide you with exceptional heart care, we have created designated Provider Care Teams.  These Care Teams include your primary Cardiologist (physician) and Advanced Practice Providers (APPs -  Physician Assistants and Nurse Practitioners) who all work together to provide you with the care you need, when you need it.  Your next appointment:   12 month(s)  The format for your next appointment:   In Person  Provider:   Michael Cooper, MD   

## 2020-01-22 ENCOUNTER — Encounter (HOSPITAL_COMMUNITY): Payer: Self-pay

## 2020-01-22 ENCOUNTER — Other Ambulatory Visit (HOSPITAL_COMMUNITY): Payer: Medicare Other

## 2020-02-07 ENCOUNTER — Other Ambulatory Visit: Payer: Self-pay

## 2020-02-07 ENCOUNTER — Ambulatory Visit (HOSPITAL_COMMUNITY)
Admission: RE | Admit: 2020-02-07 | Discharge: 2020-02-07 | Disposition: A | Discharge status: Home / Self Care | Payer: Medicare Other | Source: Ambulatory Visit | Attending: Cardiology | Admitting: Cardiology

## 2020-02-07 ENCOUNTER — Encounter (HOSPITAL_COMMUNITY): Payer: Self-pay

## 2020-02-07 VITALS — BP 128/60 | HR 62 | Wt 234.4 lb

## 2020-02-07 DIAGNOSIS — I5022 Chronic systolic (congestive) heart failure: Secondary | ICD-10-CM | POA: Diagnosis present

## 2020-02-07 DIAGNOSIS — Z7984 Long term (current) use of oral hypoglycemic drugs: Secondary | ICD-10-CM | POA: Insufficient documentation

## 2020-02-07 DIAGNOSIS — Z7982 Long term (current) use of aspirin: Secondary | ICD-10-CM | POA: Diagnosis not present

## 2020-02-07 DIAGNOSIS — N183 Chronic kidney disease, stage 3 unspecified: Secondary | ICD-10-CM | POA: Diagnosis not present

## 2020-02-07 DIAGNOSIS — Z79899 Other long term (current) drug therapy: Secondary | ICD-10-CM | POA: Insufficient documentation

## 2020-02-07 DIAGNOSIS — Z8249 Family history of ischemic heart disease and other diseases of the circulatory system: Secondary | ICD-10-CM | POA: Diagnosis not present

## 2020-02-07 DIAGNOSIS — I712 Thoracic aortic aneurysm, without rupture: Secondary | ICD-10-CM | POA: Insufficient documentation

## 2020-02-07 DIAGNOSIS — R296 Repeated falls: Secondary | ICD-10-CM | POA: Insufficient documentation

## 2020-02-07 DIAGNOSIS — E1122 Type 2 diabetes mellitus with diabetic chronic kidney disease: Secondary | ICD-10-CM | POA: Insufficient documentation

## 2020-02-07 DIAGNOSIS — Z8679 Personal history of other diseases of the circulatory system: Secondary | ICD-10-CM | POA: Insufficient documentation

## 2020-02-07 DIAGNOSIS — Z791 Long term (current) use of non-steroidal anti-inflammatories (NSAID): Secondary | ICD-10-CM | POA: Insufficient documentation

## 2020-02-07 DIAGNOSIS — Z794 Long term (current) use of insulin: Secondary | ICD-10-CM | POA: Diagnosis not present

## 2020-02-07 DIAGNOSIS — I480 Paroxysmal atrial fibrillation: Secondary | ICD-10-CM | POA: Diagnosis not present

## 2020-02-07 DIAGNOSIS — R0601 Orthopnea: Secondary | ICD-10-CM | POA: Diagnosis not present

## 2020-02-07 LAB — BASIC METABOLIC PANEL
Anion gap: 9 (ref 5–15)
BUN: 17 mg/dL (ref 8–23)
CO2: 23 mmol/L (ref 22–32)
Calcium: 9.3 mg/dL (ref 8.9–10.3)
Chloride: 105 mmol/L (ref 98–111)
Creatinine, Ser: 1.1 mg/dL (ref 0.61–1.24)
GFR, Estimated: >60 mL/min (ref >60)
Glucose, Bld: 179 mg/dL — ABNORMAL HIGH (ref 70–99)
Potassium: 4.4 mmol/L (ref 3.5–5.1)
Sodium: 137 mmol/L (ref 135–145)

## 2020-02-07 LAB — BRAIN NATRIURETIC PEPTIDE: B Natriuretic Peptide: 509 pg/mL — ABNORMAL HIGH (ref 0.0–100.0)

## 2020-02-07 MED ORDER — TORSEMIDE 20 MG PO TABS
40.0000 mg | ORAL_TABLET | Freq: Two times a day (BID) | ORAL | 3 refills | Status: DC
Start: 2020-02-07 — End: 2020-02-07

## 2020-02-07 MED ORDER — TORSEMIDE 20 MG PO TABS: 20.0000 mg | ORAL_TABLET | Freq: Two times a day (BID) | ORAL | 3 refills | Status: AC

## 2020-02-07 NOTE — Progress Notes (Signed)
ReDS Vest / Clip - 02/07/20 1200      ReDS Vest / Clip   Station Marker D    Ruler Value 31    ReDS Value Range High volume overload    ReDS Actual Value 44    Anatomical Comments sitting

## 2020-02-07 NOTE — Progress Notes (Signed)
ID:  Alan [name], DOB [birth year], MRN [id number]   Provider location: [place] [facility] Type of Visit: Established patient   PCP:  Clinic, Alan [name]  Cardiologist:  Alan Bollman, MD Primary HF: Alan [name]   History of Present Illness: Alan [name] is a 79 y.o. male who has a history of diabetes, CKD stage 3, thoracic aortic aneurysm, and chronic systolic CHF.  Patient has a long history of CHF, diagnosed around 2014.  Cath in 2014 showed no significant CAD.  Over the years, his EF has ranged from 35-45%, never markedly low.  Echo from 9/20 showed EF 40-45% with normal RV function.    For several years, he has had gradually worsening exertional dyspnea and decreased exercise tolerance.  He sleeps very poor due to orthopnea and PND.  He is very sleepy during the day.  He is short of breath walking around the house, walking to the mailbox, and walking up any incline.  No chest pain.   He has had a history of frequent falls, his girlfriend says that he seems to fall asleep standing at times and will fall (does not pass out).  In 1/21, he fell and hit his head.  He was found to have a traumatic subarachnoid hemorrhage and a left radial fracture.  Aspirin was stopped.  He was noted to be in atrial fibrillation during this admission.  He was not anticoagulated due to falls and SAH.  Echo at [facility] in 1/21 showed EF 45-50% with severe LV dilation, moderate LVH, mild-moderate MR, mild-moderate AI.   Patient had RHC in 3/21, showing optimized filling pressures and normal cardiac index.  PYP scan in 3/21 was not suggestive of TTR amyloidosis.    Recently seen in f/u by Alan [name] 01/08/20 and noted to be mildly fluid overloaded. It was recommended that he start on Farxiga 10 mg daily. Alan [name] was avoided given h/o of falls and concerns for orthostasis.   He returns back for f/u. Unfortunately, he was never able to get Alan [name] or Alan [name] from the [place]. They will not  approve it. He continues on the same med regimen. Remains fluid overloaded. ReDs clip 44% but not highly symptomatic. No resting dyspnea. He continues to struggle w/ orthopnea but this has been chronic. Has mild dyspnea w/ physical activity. BP is ok. He admits to poor compliance w/ his torsemide. He has missed several doses this week. Avoids due to frequent urination and several appts and errands to run this week.     Labs (12/20): NT-proBNP 975, hgb 13, K 4.9, creatinine 1.6 Labs (1/21): K 4.3, creatinine 1.5, BNP 353 Labs (2/21): K 4.4, creatinine 1.35 => 1.29, hgb 11.5, myeloma panel negative, urine immunofixation negative Labs (3/21): K 4.4, creatinine 1.3 Labs (5/21): LDL 44, K 4.4, creatinine 8.14 Labs (9/21): SCr 1.18, K 4.7   ECG: not performed   PMH: 1. Type II diabetes with gastroparesis 2. Thoracic aortic aneurysm: - CTA chest 1/18 with 5.1 cm ascending aorta.  - CTA chest 6/19 with 4.9 cm ascending aorta - CTA chest 5/21 with 4.9 cm ascending aorta  3. Chronic systolic CHF: Nonischemic cardiomyopathy.  - LHC (11/14): No significant CAD.  - Echo (7/16): EF 40% - Echo (5/18): EF 35-40% - Echo (9/20): EF 40-45%, normal RV.  - Echo (1/21, [facility]): EF 45-50%, moderate LVH, mild-moderate MR, mild-moderate AI.   - RHC (3/21): mean RA 1, PA 39/7, mean PCWP 14, CI 2.53 - PYP scan (3/21): grade 2,  H/CL 1.2 => unlikely to be TTR amyloidosis.  4. CKD: Stage 3 5. Traumatic subarachnoid hemorrhage (12/20).  6. Atrial fibrillation: Paroxysmal.  7. C-spine arthritis 8. PTSD   Social History   Socioeconomic History  . Marital status: Divorced    Spouse name: Not on file  . Number of children: Not on file  . Years of education: Not on file  . Highest education level: Not on file  Occupational History  . Not on file  Tobacco Use  . Smoking status: Never Smoker  . Smokeless tobacco: Never Used  Vaping Use  . Vaping Use: Never used  Substance and Sexual Activity  . Alcohol  use: No  . Drug use: No  . Sexual activity: Not on file  Other Topics Concern  . Not on file  Social History Narrative  . Not on file   Social Determinants of Health   Financial Resource Strain:   . Difficulty of Paying Living Expenses: Not on file  Food Insecurity:   . Worried About Programme researcher, broadcasting/film/video in the Last Year: Not on file  . Ran Out of Food in the Last Year: Not on file  Transportation Needs:   . Lack of Transportation (Medical): Not on file  . Lack of Transportation (Non-Medical): Not on file  Physical Activity:   . Days of Exercise per Week: Not on file  . Minutes of Exercise per Session: Not on file  Stress:   . Feeling of Stress : Not on file  Social Connections:   . Frequency of Communication with Friends and Family: Not on file  . Frequency of Social Gatherings with Friends and Family: Not on file  . Attends Religious Services: Not on file  . Active Member of Clubs or Organizations: Not on file  . Attends Banker Meetings: Not on file  . Marital Status: Not on file  Intimate Partner Violence:   . Fear of Current or Ex-Partner: Not on file  . Emotionally Abused: Not on file  . Physically Abused: Not on file  . Sexually Abused: Not on file   Family History  Problem Relation Age of Onset  . Heart Problems Mother   . Heart attack Father   . Healthy Sister        NO HEART ISSUES  . Healthy Sister        NO HEART ISSUES   ROS: All systems reviewed and negative except as per HPI.   Current Outpatient Medications  Medication Sig Dispense Refill  . Alogliptin Benzoate 25 MG TABS Take 12.5 mg by mouth daily.    Marland Kitchen aspirin EC 81 MG tablet Take 1 tablet (81 mg total) by mouth daily. 90 tablet 3  . brimonidine (ALPHAGAN) 0.2 % ophthalmic solution Place 1 drop into the right eye in the morning and at bedtime.     . calcium carbonate (TUMS - DOSED IN MG ELEMENTAL CALCIUM) 500 MG chewable tablet Chew 1 tablet by mouth daily as needed for indigestion or  heartburn.    . Cholecalciferol (VITAMIN D) 2000 UNITS CAPS Take 2,000 Units by mouth at bedtime.     . clobetasol (TEMOVATE) 0.05 % external solution Apply 1 application topically daily as needed (Bump on back).     . docusate sodium (COLACE) 100 MG capsule Take 100 mg by mouth 2 (two) times daily.    . dorzolamide-timolol (COSOPT) 22.3-6.8 MG/ML ophthalmic solution Place 1 drop into the right eye 2 (two) times daily.     Marland Kitchen  glipiZIDE (GLUCOTROL) 10 MG tablet Take 5-10 mg by mouth See admin instructions. Take 10 mg in the morning and 5 mg at bedtime    . LANTUS SOLOSTAR 100 UNIT/ML Solostar Pen Inject 18 Units into the skin at bedtime.     Marland Kitchen latanoprost (XALATAN) 0.005 % ophthalmic solution Place 1 drop into the left eye at bedtime.     . meclizine (ANTIVERT) 25 MG tablet Take 25 mg by mouth in the morning and at bedtime.     . metFORMIN (GLUCOPHAGE) 500 MG tablet Take 500-1,000 mg by mouth See admin instructions. Take 1000 mg in the morning and 500 mg at bedtime    . methocarbamol (ROBAXIN) 500 MG tablet Take 500 mg by mouth every 6 (six) hours as needed for muscle spasms.    . metoprolol succinate (TOPROL-XL) 25 MG 24 hr tablet Take 12.5 mg by mouth 2 (two) times daily.    . Netarsudil-Latanoprost (ROCKLATAN) 0.02-0.005 % SOLN Place 1 drop into the right eye at bedtime.    . polyethylene glycol (MIRALAX / GLYCOLAX) 17 g packet Take 17 g by mouth daily as needed for moderate constipation.    . rosuvastatin (CRESTOR) 5 MG tablet Take 1 tablet (5 mg total) by mouth at bedtime. 30 tablet 11  . spironolactone (ALDACTONE) 25 MG tablet Take 1 tablet (25 mg total) by mouth daily. 30 tablet 0  . torsemide (DEMADEX) 20 MG tablet Take 20 mg by mouth 2 (two) times daily.     . traMADol (ULTRAM) 50 MG tablet Take 50 mg by mouth at bedtime.     . vitamin B-12 (CYANOCOBALAMIN) 500 MCG tablet Take 500 mcg by mouth in the morning and at bedtime.     . empagliflozin (JARDIANCE) 10 MG TABS tablet Take 1 tablet (10  mg total) by mouth daily before breakfast. 14 tablet 0   No current facility-administered medications for this encounter.    Exam:   BP 128/60   Pulse 62   Wt 106.3 kg (234 lb 6.4 oz)   SpO2 97%   BMI 33.63 kg/m  General: NAD Neck: JVP 8 cm with HJR, no thyromegaly or thyroid nodule.  Lungs: Clear to auscultation bilaterally with normal respiratory effort. CV: Nondisplaced PMI.  Heart regular S1/S2, no S3/S4, no murmur.  1+ ankle edema.  No carotid bruit.  Normal pedal pulses.  Abdomen: Soft, nontender, no hepatosplenomegaly, no distention.  Skin: Intact without lesions or rashes.  Neurologic: Alert and oriented x 3.  Psych: Normal affect. Extremities: No clubbing or cyanosis.  HEENT: Normal.   Assessment/Plan: 1. Chronic systolic CHF: Nonischemic cardiomyopathy by cath in 2014.  Over the years, EF has ranged 35-45%, echo in 9/20 with EF 40-45%. Most recent echo at [facility] in 1/21 was reported to have EF 45-50%. RHC in 3/21 showed normal filling pressures suggesting good diuresis and preserved cardiac output.  PYP scan was not suggestive of cardiac amyloidosis.  Medication titration has been limited by orthostasis. -  He is fluid overloaded today. ReDs clip 44%, in setting of poor compliance w/ home torsemide.  Symptoms stable, NYHA class II-III.   - Unfortunately, he is unable to get Alan [name] no Jardiance from the [place]. Will d/w PharmD and SW med assistance and ? HF fund. Alan [name] has been avoided given h/o orthostasis and falls - for now, he needs to improve compliance w/ torsemide. Increase to 40 qam/20 qpm x 2 days then return to 20 mg bid.   - Continue Toprol XL 12.5  mg bid.  - Continue spironolactone 25 mg daily.   - Check BMP and BNP today and again in 7 days  2. CKD: Stage 3.  Check BMP today  3. Suspect OSA: home sleep study, he has this at home but needs to use it. We discussed this again today.  4. Diabetes: Reasonable control recently.  - As above, will see if we can get pt  assistance for SGLT2i - He is on Crestor, good lipids in 5/21.  5. Ascending aortic aneurysm: 5/21 CTA showed 4.9 cm ascending aorta.  - Follow yearly.  6. Atrial fibrillation: Paroxysmal.  Noted in the hospital when he had SAH. RRR on exam today. Anticoagulation was not started due to frequent falls and SAH.  - He is on ASA 81 daily.   F/u in 4 weeks to reassess fluid status   Signed, Robbie Lis, PA-C  02/07/2020  [facility] [address] [facility] [place] [address] [phone] (office) [phone] (fax)

## 2020-02-07 NOTE — Patient Instructions (Addendum)
CONTINUE Torsemide 20mg  (1 tablet) Twice Daily  Labs done today, your results will be available in MyChart, we will contact you for abnormal readings.  Your physician recommends that you return for a follow up visit in the APP clinic in 3-4 weeks  If you have any questions or concerns before your next appointment please send a message through [place] or call our office at [phone].    TO LEAVE A MESSAGE FOR THE NURSE SELECT OPTION 2, PLEASE LEAVE A MESSAGE INCLUDING: . YOUR NAME . DATE OF BIRTH . CALL BACK NUMBER . REASON FOR CALL**this is important as we prioritize the call backs  YOU WILL RECEIVE A CALL BACK THE SAME DAY AS LONG AS YOU CALL BEFORE 4:00 PM

## 2020-02-16 ENCOUNTER — Other Ambulatory Visit (HOSPITAL_COMMUNITY): Payer: Medicare Other

## 2020-03-01 ENCOUNTER — Other Ambulatory Visit (HOSPITAL_COMMUNITY)
Admission: RE | Admit: 2020-03-01 | Discharge: 2020-03-01 | Disposition: A | Discharge status: Home / Self Care | Payer: Medicare Other | Source: Ambulatory Visit | Attending: Gastroenterology | Admitting: Gastroenterology

## 2020-03-01 DIAGNOSIS — Z01812 Encounter for preprocedural laboratory examination: Secondary | ICD-10-CM | POA: Diagnosis present

## 2020-03-01 DIAGNOSIS — Z20822 Contact with and (suspected) exposure to covid-19: Secondary | ICD-10-CM | POA: Diagnosis not present

## 2020-03-01 LAB — SARS CORONAVIRUS 2 (TAT 6-24 HRS): SARS Coronavirus 2: NEGATIVE

## 2020-03-04 ENCOUNTER — Encounter (HOSPITAL_COMMUNITY): Payer: Medicare Other

## 2020-03-05 ENCOUNTER — Ambulatory Visit (HOSPITAL_COMMUNITY)
Admission: RE | Admit: 2020-03-05 | Discharge: 2020-03-05 | Disposition: A | Discharge status: Home / Self Care | Payer: Medicare Other | Attending: Gastroenterology | Admitting: Gastroenterology

## 2020-03-05 ENCOUNTER — Encounter (HOSPITAL_COMMUNITY)
Admission: RE | Disposition: A | Discharge status: Home / Self Care | Payer: Self-pay | Source: Home / Self Care | Attending: Gastroenterology

## 2020-03-05 DIAGNOSIS — Z539 Procedure and treatment not carried out, unspecified reason: Secondary | ICD-10-CM | POA: Insufficient documentation

## 2020-03-05 DIAGNOSIS — R1310 Dysphagia, unspecified: Secondary | ICD-10-CM | POA: Diagnosis present

## 2020-03-05 LAB — GLUCOSE, CAPILLARY: Glucose-Capillary: 184 mg/dL — ABNORMAL HIGH (ref 70–99)

## 2020-03-05 SURGERY — INVASIVE LAB ABORTED CASE

## 2020-03-05 MED ORDER — LIDOCAINE VISCOUS HCL 2 % MT SOLN
OROMUCOSAL | Status: AC
  Filled 2020-03-05: qty 15

## 2020-03-05 SURGICAL SUPPLY — 2 items
FACESHIELD LNG OPTICON STERILE (SAFETY) IMPLANT
GLOVE BIO SURGEON STRL SZ8 (GLOVE) ×6 IMPLANT

## 2020-03-05 NOTE — Progress Notes (Signed)
Esophageal manometry study explalined to patient and caregiver. RN assisted patient onto stretcher.  Lido instilled into left nostril. Manometry probe advanced about half way via left nostril before  patient requested to have the probe removed. Patient  refused to continue with the study and manometry  probe was  removed. Patient declined a second attempt. Patient discharged to home accompanied by caregiver. Note send to Dr.Nandigam.

## 2020-03-05 NOTE — OR Nursing (Signed)
Patient c/o feeling like blood sugar dropping while in waiting room. Brought patient to preop area and blood glucose check performed. Blood sugar 184. Waymond Cera, RN notified and patient taken back to waiting area with family.

## 2020-03-06 ENCOUNTER — Encounter (HOSPITAL_COMMUNITY): Payer: Self-pay

## 2020-03-06 ENCOUNTER — Ambulatory Visit (HOSPITAL_COMMUNITY)
Admission: RE | Admit: 2020-03-06 | Discharge: 2020-03-06 | Disposition: A | Discharge status: Home / Self Care | Payer: Medicare Other | Source: Ambulatory Visit | Attending: Adult Health | Admitting: Adult Health

## 2020-03-06 ENCOUNTER — Other Ambulatory Visit: Payer: Self-pay

## 2020-03-06 VITALS — BP 116/58 | HR 64 | Wt 234.0 lb

## 2020-03-06 DIAGNOSIS — Z7982 Long term (current) use of aspirin: Secondary | ICD-10-CM | POA: Diagnosis not present

## 2020-03-06 DIAGNOSIS — I5022 Chronic systolic (congestive) heart failure: Secondary | ICD-10-CM | POA: Insufficient documentation

## 2020-03-06 DIAGNOSIS — I428 Other cardiomyopathies: Secondary | ICD-10-CM | POA: Diagnosis not present

## 2020-03-06 DIAGNOSIS — I480 Paroxysmal atrial fibrillation: Secondary | ICD-10-CM | POA: Insufficient documentation

## 2020-03-06 DIAGNOSIS — Z794 Long term (current) use of insulin: Secondary | ICD-10-CM | POA: Diagnosis not present

## 2020-03-06 DIAGNOSIS — Z79899 Other long term (current) drug therapy: Secondary | ICD-10-CM | POA: Insufficient documentation

## 2020-03-06 DIAGNOSIS — I712 Thoracic aortic aneurysm, without rupture: Secondary | ICD-10-CM | POA: Insufficient documentation

## 2020-03-06 DIAGNOSIS — E1122 Type 2 diabetes mellitus with diabetic chronic kidney disease: Secondary | ICD-10-CM | POA: Insufficient documentation

## 2020-03-06 DIAGNOSIS — N183 Chronic kidney disease, stage 3 unspecified: Secondary | ICD-10-CM | POA: Diagnosis not present

## 2020-03-06 LAB — BASIC METABOLIC PANEL
Anion gap: 8 (ref 5–15)
BUN: 22 mg/dL (ref 8–23)
CO2: 26 mmol/L (ref 22–32)
Calcium: 9.5 mg/dL (ref 8.9–10.3)
Chloride: 105 mmol/L (ref 98–111)
Creatinine, Ser: 1.37 mg/dL — ABNORMAL HIGH (ref 0.61–1.24)
GFR, Estimated: 53 mL/min — ABNORMAL LOW (ref >60)
Glucose, Bld: 208 mg/dL — ABNORMAL HIGH (ref 70–99)
Potassium: 4.5 mmol/L (ref 3.5–5.1)
Sodium: 139 mmol/L (ref 135–145)

## 2020-03-06 LAB — BRAIN NATRIURETIC PEPTIDE: B Natriuretic Peptide: 706.5 pg/mL — ABNORMAL HIGH (ref 0.0–100.0)

## 2020-03-06 MED ORDER — EMPAGLIFLOZIN 25 MG PO TABS: ORAL_TABLET | ORAL | 11 refills | Status: AC

## 2020-03-06 NOTE — Patient Instructions (Signed)
Start Jardiance 12.5 mg, one half tab daily Labs today We will only contact you if something comes back abnormal or we need to make some changes. Otherwise no news is good news!  Keep follow up as scheduled  If you have any questions or concerns before your next appointment please send [place] a message through [place] or call our office at [phone].    TO LEAVE A MESSAGE FOR THE NURSE SELECT OPTION 2, PLEASE LEAVE A MESSAGE INCLUDING: . YOUR NAME . DATE OF BIRTH . CALL BACK NUMBER . REASON FOR CALL**this is important as we prioritize the call backs  YOU WILL RECEIVE A CALL BACK THE SAME DAY AS LONG AS YOU CALL BEFORE 4:00 PM

## 2020-03-06 NOTE — Progress Notes (Signed)
ReDS Vest / Clip - 03/06/20 1400      ReDS Vest / Clip   Station Marker D    Ruler Value 34    ReDS Value Range Moderate volume overload    ReDS Actual Value 40    Anatomical Comments sitting

## 2020-03-06 NOTE — Progress Notes (Signed)
ID:  Alan [name], DOB 09/24/1940, MRN [id number]   Provider location: [facility] Type of Visit: Established patient   PCP:  [facility]  Cardiologist:  [name], MD Primary HF: Dr. Shirlee Latch   History of Present Illness: Alan [name] is a 79 y.o. male who has a history of diabetes, CKD stage 3, thoracic aortic aneurysm, and chronic systolic CHF.  Patient has a long history of CHF, diagnosed around 2014.  Cath in 2014 showed no significant CAD.  Over the years, his EF has ranged from 35-45%, never markedly low.  Echo from 9/20 showed EF 40-45% with normal RV function.    For several years, he has had gradually worsening exertional dyspnea and decreased exercise tolerance.  He sleeps very poor due to orthopnea and PND.  He is very sleepy during the day.  He is short of breath walking around the house, walking to the mailbox, and walking up any incline.  No chest pain.   He has had a history of frequent falls, his girlfriend says that he seems to fall asleep standing at times and will fall (does not pass out).  In 1/21, he fell and hit his head.  He was found to have a traumatic subarachnoid hemorrhage and a left radial fracture.  Aspirin was stopped.  He was noted to be in atrial fibrillation during this admission.  He was not anticoagulated due to falls and SAH.  Echo at [facility] in 1/21 showed EF 45-50% with severe LV dilation, moderate LVH, mild-moderate MR, mild-moderate AI.   Patient had RHC in 3/21, showing optimized filling pressures and normal cardiac index.  PYP scan in 3/21 was not suggestive of TTR amyloidosis.    Recently seen in f/u by Dr. Shirlee Latch 01/08/20 and noted to be mildly fluid overloaded. It was recommended that he start on Farxiga 10 mg daily. Sherryll Burger was avoided given h/o of falls and concerns for orthostasis.   Unfortunately, he was never able to get [place] or [place] from the [place]. They did not approve it. I saw him back  for f/u on 10/14 and he was still fluid overloaded. ReDs clip was 44% but not highly symptomatic. He admited to poor compliance w/ his torsemide. He has missed several doses that week.  He was instructed to increase torsemide to 40 qam/20 qpm x 2 days then return to 20 mg bid.    He presents back to clinic for f/u. Wt unchanged. ReDs clip lower but still elevated at 40%. Has bilateral LEE on exam. His significant other reports that he is still not 100% compliant w/ torsemide. He is mildly SOB w/ physical activity. No symptoms at rest. BP is 116/58.   Our pharmacy team has been working to get assistance for an SGLT2i. He can now get Jardiance for $33.00/ month, which he states is affordable. He is willing to start.      Labs (12/20): NT-proBNP 975, hgb 13, K 4.9, creatinine 1.6 Labs (1/21): K 4.3, creatinine 1.5, BNP 353 Labs (2/21): K 4.4, creatinine 1.35 => 1.29, hgb 11.5, myeloma panel negative, urine immunofixation negative Labs (3/21): K 4.4, creatinine 1.3 Labs (5/21): LDL 44, K 4.4, creatinine 5.91 Labs (9/21): SCr 1.18, K 4.7  Labs (10/21): SCr 1.10, K 4.4, GRF >60   ECG: not performed   PMH: 1. Type II diabetes with gastroparesis 2. Thoracic aortic aneurysm: - CTA chest 1/18 with 5.1 cm ascending aorta.  - CTA chest 6/19 with 4.9 cm  ascending aorta - CTA chest 5/21 with 4.9 cm ascending aorta  3. Chronic systolic CHF: Nonischemic cardiomyopathy.  - LHC (11/14): No significant CAD.  - Echo (7/16): EF 40% - Echo (5/18): EF 35-40% - Echo (9/20): EF 40-45%, normal RV.  - Echo (1/21, [facility]): EF 45-50%, moderate LVH, mild-moderate MR, mild-moderate AI.   - RHC (3/21): mean RA 1, PA 39/7, mean PCWP 14, CI 2.53 - PYP scan (3/21): grade 2, H/CL 1.2 => unlikely to be TTR amyloidosis.  4. CKD: Stage 3 5. Traumatic subarachnoid hemorrhage (12/20).  6. Atrial fibrillation: Paroxysmal.  7. C-spine arthritis 8. PTSD   Social History   Socioeconomic History  . Marital status:  Divorced    Spouse name: Not on file  . Number of children: Not on file  . Years of education: Not on file  . Highest education level: Not on file  Occupational History  . Not on file  Tobacco Use  . Smoking status: Never Smoker  . Smokeless tobacco: Never Used  Vaping Use  . Vaping Use: Never used  Substance and Sexual Activity  . Alcohol use: No  . Drug use: No  . Sexual activity: Not on file  Other Topics Concern  . Not on file  Social History Narrative  . Not on file   Social Determinants of Health   Financial Resource Strain:   . Difficulty of Paying Living Expenses: Not on file  Food Insecurity:   . Worried About Programme researcher, broadcasting/film/video in the Last Year: Not on file  . Ran Out of Food in the Last Year: Not on file  Transportation Needs:   . Lack of Transportation (Medical): Not on file  . Lack of Transportation (Non-Medical): Not on file  Physical Activity:   . Days of Exercise per Week: Not on file  . Minutes of Exercise per Session: Not on file  Stress:   . Feeling of Stress : Not on file  Social Connections:   . Frequency of Communication with Friends and Family: Not on file  . Frequency of Social Gatherings with Friends and Family: Not on file  . Attends Religious Services: Not on file  . Active Member of Clubs or Organizations: Not on file  . Attends Banker Meetings: Not on file  . Marital Status: Not on file  Intimate Partner Violence:   . Fear of Current or Ex-Partner: Not on file  . Emotionally Abused: Not on file  . Physically Abused: Not on file  . Sexually Abused: Not on file   Family History  Problem Relation Age of Onset  . Heart Problems Mother   . Heart attack Father   . Healthy Sister        NO HEART ISSUES  . Healthy Sister        NO HEART ISSUES   ROS: All systems reviewed and negative except as per HPI.   Current Outpatient Medications  Medication Sig Dispense Refill  . Alogliptin Benzoate 25 MG TABS Take 12.5 mg by  mouth daily.    Marland Kitchen aspirin EC 81 MG tablet Take 1 tablet (81 mg total) by mouth daily. 90 tablet 3  . brimonidine (ALPHAGAN) 0.2 % ophthalmic solution Place 1 drop into the right eye in the morning and at bedtime.     . calcium carbonate (TUMS - DOSED IN MG ELEMENTAL CALCIUM) 500 MG chewable tablet Chew 1 tablet by mouth daily as needed for indigestion or heartburn.    . Cholecalciferol (  VITAMIN D) 2000 UNITS CAPS Take 2,000 Units by mouth at bedtime.     . clobetasol (TEMOVATE) 0.05 % external solution Apply 1 application topically daily as needed (Bump on back).     . docusate sodium (COLACE) 100 MG capsule Take 100 mg by mouth 2 (two) times daily.    . dorzolamide-timolol (COSOPT) 22.3-6.8 MG/ML ophthalmic solution Place 1 drop into the right eye 2 (two) times daily.     . empagliflozin (JARDIANCE) 10 MG TABS tablet Take 1 tablet (10 mg total) by mouth daily before breakfast. 14 tablet 0  . glipiZIDE (GLUCOTROL) 10 MG tablet Take 5-10 mg by mouth See admin instructions. Take 10 mg in the morning and 5 mg at bedtime    . LANTUS SOLOSTAR 100 UNIT/ML Solostar Pen Inject 18 Units into the skin at bedtime.     Marland Kitchen latanoprost (XALATAN) 0.005 % ophthalmic solution Place 1 drop into the left eye at bedtime.     . meclizine (ANTIVERT) 25 MG tablet Take 25 mg by mouth in the morning and at bedtime.     . metFORMIN (GLUCOPHAGE) 500 MG tablet Take 500-1,000 mg by mouth See admin instructions. Take 1000 mg in the morning and 500 mg at bedtime    . methocarbamol (ROBAXIN) 500 MG tablet Take 500 mg by mouth every 6 (six) hours as needed for muscle spasms.    . metoprolol succinate (TOPROL-XL) 25 MG 24 hr tablet Take 12.5 mg by mouth 2 (two) times daily.    . Netarsudil-Latanoprost (ROCKLATAN) 0.02-0.005 % SOLN Place 1 drop into the right eye at bedtime.    Marland Kitchen omeprazole (PRILOSEC) 20 MG capsule Take 20 mg by mouth daily.    . polyethylene glycol (MIRALAX / GLYCOLAX) 17 g packet Take 17 g by mouth daily as needed  for moderate constipation.    . rosuvastatin (CRESTOR) 5 MG tablet Take 1 tablet (5 mg total) by mouth at bedtime. 30 tablet 11  . spironolactone (ALDACTONE) 25 MG tablet Take 1 tablet (25 mg total) by mouth daily. 30 tablet 0  . torsemide (DEMADEX) 20 MG tablet Take 1 tablet (20 mg total) by mouth 2 (two) times daily. 60 tablet 3  . traMADol (ULTRAM) 50 MG tablet Take 50 mg by mouth at bedtime.     . vitamin B-12 (CYANOCOBALAMIN) 500 MCG tablet Take 500 mcg by mouth in the morning and at bedtime.      No current facility-administered medications for this encounter.    Vitals:   BP (!) 116/58   Pulse 64   Wt 106.1 kg (234 lb)   SpO2 97%   BMI 33.58 kg/m  PHYSICAL EXAM: General:  Well appearing. No respiratory difficulty HEENT: normal Neck: supple. JVD ~8 cm. Carotids 2+ bilat; no bruits. No lymphadenopathy or thyromegaly appreciated. Cor: PMI nondisplaced. Regular rate & rhythm. No rubs, gallops or murmurs. Lungs: clear Abdomen: soft, nontender, nondistended. No hepatosplenomegaly. No bruits or masses. Good bowel sounds. Extremities: no cyanosis, clubbing, rash, 1+ bilateral LEE Neuro: alert & oriented x 3, cranial nerves grossly intact. moves all 4 extremities w/o difficulty. Affect pleasant.   Assessment/Plan: 1. Chronic systolic CHF: Nonischemic cardiomyopathy by cath in 2014.  Over the years, EF has ranged 35-45%, echo in 9/20 with EF 40-45%. Most recent echo at [facility] in 1/21 was reported to have EF 45-50%. RHC in 3/21 showed normal filling pressures suggesting good diuresis and preserved cardiac output.  PYP scan was not suggestive of cardiac amyloidosis.  Medication titration  has been limited by orthostasis. -  He remains fluid overloaded today. ReDs clip 40%, in setting of poor compliance w/ home torsemide.  Symptoms stable, NYHA class II-III.   - We have been trying to get him started on Farxiga or Jardiance from the [place] but was never approved.  He can now get patient  assistance for Jardiance for $33.00/month, which he says is affordable. Willing to start today. Start 10 mg daily. Will check Hgb A1c. Check BMP today and again in 7-10 days. Advised to closely monitor blood glucose levels w/ home glucometer.  - No  Entresto>> has been avoided given h/o orthostasis and falls - for now - Needs to improve compliance w/ torsemide. Continue 20 mg bid.   - Continue Toprol XL 12.5 mg bid.  - Continue spironolactone 25 mg daily.   2. CKD: Stage 3.  Check BMP today and again in 7-10 days, after start of Jardiance  3. Suspect OSA: needs home sleep study. He never completed home sleep study, now trying to get arranged through the [place]  4. Diabetes: Reasonable control recently.  - As above, will start Jardiance. Check Hgb A1c today  - He is on Crestor, good lipids in 5/21.  5. Ascending aortic aneurysm: 5/21 CTA showed 4.9 cm ascending aorta.  - Follow yearly. Repeat scan 6/21 6. Atrial fibrillation: Paroxysmal.  Noted in the hospital when he had SAH. RRR on exam today. Anticoagulation was not started due to frequent falls and SAH.  - He is on ASA 81 daily.   Keep F/u w/ Dr. Shirlee Latch next month, 12/16.   Signed, Robbie Lis, PA-C  03/06/2020  [facility]  [address] [facility] [place] [address] [phone] (office) [phone] (fax)

## 2020-03-26 ENCOUNTER — Telehealth: Payer: Self-pay | Admitting: Gastroenterology

## 2020-03-26 NOTE — Telephone Encounter (Signed)
Pts S/O is calling, states pt was unable to complete EM. She is wanting to know if there is another diagnostic test that might be an option for the pt. Please advise.

## 2020-03-27 NOTE — Telephone Encounter (Signed)
Sorry to hear he had trouble with it, yes it is a challenging test to undergo.  I am afraid we do not have another test available to give [place] the same information about the esophagus muscle function.  So I do not know what to recommend for treatment.   I hope the endoscopic dilation provided some relief.  His symptoms and endoscopy findings suggest he may have esophageal spasm.  If so, some patients get relief from taking an Altoid mint before eating because it can relieve the spasm.  And let's arrange a barium swallow with liquid and tablet to see if that gives [place] any more information.  He had one at another GI clinic in 2018.

## 2020-03-27 NOTE — Telephone Encounter (Signed)
Attempted to call Alan [name] back, no answer and unable to leave message.

## 2020-03-28 ENCOUNTER — Telehealth (HOSPITAL_COMMUNITY): Payer: Self-pay | Admitting: *Deleted

## 2020-03-28 NOTE — Telephone Encounter (Signed)
pts wife left vm requesting return call. I called pt back no answer/left vm requesting return call.

## 2020-04-02 ENCOUNTER — Other Ambulatory Visit: Payer: Self-pay

## 2020-04-02 DIAGNOSIS — R1310 Dysphagia, unspecified: Secondary | ICD-10-CM

## 2020-04-02 NOTE — Telephone Encounter (Signed)
Pts scheduled for barium swallow at [facility] 04/21/20@10 :30am. Pt to arrive there at 10:15am and be NPO after 7:30am. Spoke with Sherol and she is aware of appt.

## 2020-04-10 ENCOUNTER — Encounter (HOSPITAL_COMMUNITY): Payer: Medicare Other | Admitting: Cardiology

## 2020-04-11 ENCOUNTER — Inpatient Hospital Stay (HOSPITAL_BASED_OUTPATIENT_CLINIC_OR_DEPARTMENT_OTHER)
Admission: EM | Admit: 2020-04-11 | Discharge: 2020-04-26 | DRG: 071 | Disposition: E | Payer: Medicare Other | Attending: Internal Medicine | Admitting: Internal Medicine

## 2020-04-11 ENCOUNTER — Emergency Department (HOSPITAL_BASED_OUTPATIENT_CLINIC_OR_DEPARTMENT_OTHER): Payer: Medicare Other

## 2020-04-11 ENCOUNTER — Encounter (HOSPITAL_BASED_OUTPATIENT_CLINIC_OR_DEPARTMENT_OTHER): Payer: Self-pay

## 2020-04-11 ENCOUNTER — Other Ambulatory Visit: Payer: Self-pay

## 2020-04-11 DIAGNOSIS — R410 Disorientation, unspecified: Secondary | ICD-10-CM | POA: Diagnosis not present

## 2020-04-11 DIAGNOSIS — R0602 Shortness of breath: Secondary | ICD-10-CM

## 2020-04-11 DIAGNOSIS — F40240 Claustrophobia: Secondary | ICD-10-CM | POA: Diagnosis present

## 2020-04-11 DIAGNOSIS — S2231XA Fracture of one rib, right side, initial encounter for closed fracture: Secondary | ICD-10-CM

## 2020-04-11 DIAGNOSIS — I428 Other cardiomyopathies: Secondary | ICD-10-CM

## 2020-04-11 DIAGNOSIS — R6510 Systemic inflammatory response syndrome (SIRS) of non-infectious origin without acute organ dysfunction: Secondary | ICD-10-CM | POA: Diagnosis present

## 2020-04-11 DIAGNOSIS — Z9119 Patient's noncompliance with other medical treatment and regimen: Secondary | ICD-10-CM

## 2020-04-11 DIAGNOSIS — Z515 Encounter for palliative care: Secondary | ICD-10-CM

## 2020-04-11 DIAGNOSIS — S22000A Wedge compression fracture of unspecified thoracic vertebra, initial encounter for closed fracture: Secondary | ICD-10-CM

## 2020-04-11 DIAGNOSIS — Z20822 Contact with and (suspected) exposure to covid-19: Secondary | ICD-10-CM | POA: Diagnosis present

## 2020-04-11 DIAGNOSIS — I482 Chronic atrial fibrillation, unspecified: Secondary | ICD-10-CM | POA: Diagnosis present

## 2020-04-11 DIAGNOSIS — F411 Generalized anxiety disorder: Secondary | ICD-10-CM | POA: Diagnosis present

## 2020-04-11 DIAGNOSIS — Z7189 Other specified counseling: Secondary | ICD-10-CM | POA: Diagnosis not present

## 2020-04-11 DIAGNOSIS — Z9181 History of falling: Secondary | ICD-10-CM

## 2020-04-11 DIAGNOSIS — Z8249 Family history of ischemic heart disease and other diseases of the circulatory system: Secondary | ICD-10-CM

## 2020-04-11 DIAGNOSIS — R296 Repeated falls: Secondary | ICD-10-CM | POA: Diagnosis present

## 2020-04-11 DIAGNOSIS — M1990 Unspecified osteoarthritis, unspecified site: Secondary | ICD-10-CM | POA: Diagnosis present

## 2020-04-11 DIAGNOSIS — F4310 Post-traumatic stress disorder, unspecified: Secondary | ICD-10-CM | POA: Diagnosis present

## 2020-04-11 DIAGNOSIS — K219 Gastro-esophageal reflux disease without esophagitis: Secondary | ICD-10-CM | POA: Diagnosis present

## 2020-04-11 DIAGNOSIS — G9341 Metabolic encephalopathy: Principal | ICD-10-CM | POA: Diagnosis present

## 2020-04-11 DIAGNOSIS — Z87820 Personal history of traumatic brain injury: Secondary | ICD-10-CM

## 2020-04-11 DIAGNOSIS — E860 Dehydration: Secondary | ICD-10-CM | POA: Diagnosis present

## 2020-04-11 DIAGNOSIS — Z881 Allergy status to other antibiotic agents status: Secondary | ICD-10-CM

## 2020-04-11 DIAGNOSIS — F0280 Dementia in other diseases classified elsewhere without behavioral disturbance: Secondary | ICD-10-CM | POA: Diagnosis not present

## 2020-04-11 DIAGNOSIS — Z8546 Personal history of malignant neoplasm of prostate: Secondary | ICD-10-CM

## 2020-04-11 DIAGNOSIS — R402362 Coma scale, best motor response, obeys commands, at arrival to emergency department: Secondary | ICD-10-CM | POA: Diagnosis present

## 2020-04-11 DIAGNOSIS — E870 Hyperosmolality and hypernatremia: Secondary | ICD-10-CM | POA: Diagnosis not present

## 2020-04-11 DIAGNOSIS — Z9183 Wandering in diseases classified elsewhere: Secondary | ICD-10-CM

## 2020-04-11 DIAGNOSIS — F5104 Psychophysiologic insomnia: Secondary | ICD-10-CM | POA: Diagnosis present

## 2020-04-11 DIAGNOSIS — M4854XA Collapsed vertebra, not elsewhere classified, thoracic region, initial encounter for fracture: Secondary | ICD-10-CM | POA: Diagnosis present

## 2020-04-11 DIAGNOSIS — Z79899 Other long term (current) drug therapy: Secondary | ICD-10-CM

## 2020-04-11 DIAGNOSIS — Z888 Allergy status to other drugs, medicaments and biological substances status: Secondary | ICD-10-CM

## 2020-04-11 DIAGNOSIS — R402142 Coma scale, eyes open, spontaneous, at arrival to emergency department: Secondary | ICD-10-CM | POA: Diagnosis present

## 2020-04-11 DIAGNOSIS — I10 Essential (primary) hypertension: Secondary | ICD-10-CM | POA: Diagnosis present

## 2020-04-11 DIAGNOSIS — Z7982 Long term (current) use of aspirin: Secondary | ICD-10-CM | POA: Diagnosis not present

## 2020-04-11 DIAGNOSIS — E669 Obesity, unspecified: Secondary | ICD-10-CM | POA: Diagnosis present

## 2020-04-11 DIAGNOSIS — G3183 Dementia with Lewy bodies: Secondary | ICD-10-CM | POA: Diagnosis not present

## 2020-04-11 DIAGNOSIS — Z794 Long term (current) use of insulin: Secondary | ICD-10-CM

## 2020-04-11 DIAGNOSIS — Z66 Do not resuscitate: Secondary | ICD-10-CM | POA: Diagnosis not present

## 2020-04-11 DIAGNOSIS — I5022 Chronic systolic (congestive) heart failure: Secondary | ICD-10-CM | POA: Diagnosis not present

## 2020-04-11 DIAGNOSIS — E1165 Type 2 diabetes mellitus with hyperglycemia: Secondary | ICD-10-CM | POA: Diagnosis present

## 2020-04-11 DIAGNOSIS — W19XXXA Unspecified fall, initial encounter: Secondary | ICD-10-CM

## 2020-04-11 DIAGNOSIS — E785 Hyperlipidemia, unspecified: Secondary | ICD-10-CM | POA: Diagnosis present

## 2020-04-11 DIAGNOSIS — H9190 Unspecified hearing loss, unspecified ear: Secondary | ICD-10-CM | POA: Diagnosis present

## 2020-04-11 DIAGNOSIS — G9340 Encephalopathy, unspecified: Secondary | ICD-10-CM | POA: Diagnosis not present

## 2020-04-11 DIAGNOSIS — Z7984 Long term (current) use of oral hypoglycemic drugs: Secondary | ICD-10-CM

## 2020-04-11 DIAGNOSIS — G4700 Insomnia, unspecified: Secondary | ICD-10-CM | POA: Diagnosis present

## 2020-04-11 DIAGNOSIS — I110 Hypertensive heart disease with heart failure: Secondary | ICD-10-CM | POA: Diagnosis present

## 2020-04-11 DIAGNOSIS — F0281 Dementia in other diseases classified elsewhere with behavioral disturbance: Secondary | ICD-10-CM | POA: Diagnosis not present

## 2020-04-11 DIAGNOSIS — R402252 Coma scale, best verbal response, oriented, at arrival to emergency department: Secondary | ICD-10-CM | POA: Diagnosis present

## 2020-04-11 DIAGNOSIS — Z6831 Body mass index (BMI) 31.0-31.9, adult: Secondary | ICD-10-CM

## 2020-04-11 LAB — URINALYSIS, ROUTINE W REFLEX MICROSCOPIC
Bilirubin Urine: NEGATIVE
Glucose, UA: >=500 mg/dL — AB
Hgb urine dipstick: NEGATIVE
Ketones, ur: NEGATIVE mg/dL
Leukocytes,Ua: NEGATIVE
Nitrite: NEGATIVE
Protein, ur: NEGATIVE mg/dL
Specific Gravity, Urine: <1.005 — ABNORMAL LOW (ref 1.005–1.030)
pH: 6.5 (ref 5.0–8.0)

## 2020-04-11 LAB — CBC WITH DIFFERENTIAL/PLATELET
Abs Immature Granulocytes: 0.01 10*3/uL (ref 0.00–0.07)
Basophils Absolute: 0 10*3/uL (ref 0.0–0.1)
Basophils Relative: 0 %
Eosinophils Absolute: 0.2 10*3/uL (ref 0.0–0.5)
Eosinophils Relative: 4 %
HCT: 36 % — ABNORMAL LOW (ref 39.0–52.0)
Hemoglobin: 11.5 g/dL — ABNORMAL LOW (ref 13.0–17.0)
Immature Granulocytes: 0 %
Lymphocytes Relative: 26 %
Lymphs Abs: 1.7 10*3/uL (ref 0.7–4.0)
MCH: 26.5 pg (ref 26.0–34.0)
MCHC: 31.9 g/dL (ref 30.0–36.0)
MCV: 82.9 fL (ref 80.0–100.0)
Monocytes Absolute: 0.6 10*3/uL (ref 0.1–1.0)
Monocytes Relative: 10 %
Neutro Abs: 3.9 10*3/uL (ref 1.7–7.7)
Neutrophils Relative %: 60 %
Platelets: 198 10*3/uL (ref 150–400)
RBC: 4.34 MIL/uL (ref 4.22–5.81)
RDW: 15.2 % (ref 11.5–15.5)
WBC: 6.4 10*3/uL (ref 4.0–10.5)
nRBC: 0 % (ref 0.0–0.2)

## 2020-04-11 LAB — BLOOD GAS, VENOUS
Acid-base deficit: 2.3 mmol/L — ABNORMAL HIGH (ref 0.0–2.0)
Bicarbonate: 22.5 mmol/L (ref 20.0–28.0)
FIO2: 21
O2 Saturation: 70.6 %
Patient temperature: 98.6
pCO2, Ven: 41.1 mmHg — ABNORMAL LOW (ref 44.0–60.0)
pH, Ven: 7.357 (ref 7.250–7.430)
pO2, Ven: 41 mmHg (ref 32.0–45.0)

## 2020-04-11 LAB — BASIC METABOLIC PANEL
Anion gap: 9 (ref 5–15)
BUN: 21 mg/dL (ref 8–23)
CO2: 24 mmol/L (ref 22–32)
Calcium: 9.1 mg/dL (ref 8.9–10.3)
Chloride: 103 mmol/L (ref 98–111)
Creatinine, Ser: 1.18 mg/dL (ref 0.61–1.24)
GFR, Estimated: >60 mL/min (ref >60)
Glucose, Bld: 210 mg/dL — ABNORMAL HIGH (ref 70–99)
Potassium: 4.6 mmol/L (ref 3.5–5.1)
Sodium: 136 mmol/L (ref 135–145)

## 2020-04-11 LAB — CBG MONITORING, ED: Glucose-Capillary: 154 mg/dL — ABNORMAL HIGH (ref 70–99)

## 2020-04-11 LAB — CBC
HCT: 38.5 % — ABNORMAL LOW (ref 39.0–52.0)
Hemoglobin: 12.4 g/dL — ABNORMAL LOW (ref 13.0–17.0)
MCH: 26.6 pg (ref 26.0–34.0)
MCHC: 32.2 g/dL (ref 30.0–36.0)
MCV: 82.6 fL (ref 80.0–100.0)
Platelets: 215 10*3/uL (ref 150–400)
RBC: 4.66 MIL/uL (ref 4.22–5.81)
RDW: 15.2 % (ref 11.5–15.5)
WBC: 7.8 10*3/uL (ref 4.0–10.5)
nRBC: 0 % (ref 0.0–0.2)

## 2020-04-11 LAB — HEPATIC FUNCTION PANEL
ALT: 20 U/L (ref 0–44)
AST: 20 U/L (ref 15–41)
Albumin: 3.9 g/dL (ref 3.5–5.0)
Alkaline Phosphatase: 90 U/L (ref 38–126)
Bilirubin, Direct: 0.2 mg/dL (ref 0.0–0.2)
Indirect Bilirubin: 0.6 mg/dL (ref 0.3–0.9)
Total Bilirubin: 0.8 mg/dL (ref 0.3–1.2)
Total Protein: 7.2 g/dL (ref 6.5–8.1)

## 2020-04-11 LAB — URINALYSIS, MICROSCOPIC (REFLEX)

## 2020-04-11 LAB — AMMONIA: Ammonia: 21 umol/L (ref 9–35)

## 2020-04-11 LAB — ETHANOL: Alcohol, Ethyl (B): <10 mg/dL (ref <10)

## 2020-04-11 LAB — ACETAMINOPHEN LEVEL: Acetaminophen (Tylenol), Serum: <10 ug/mL — ABNORMAL LOW (ref 10–30)

## 2020-04-11 LAB — RESP PANEL BY RT-PCR (FLU A&B, COVID) ARPGX2
Influenza A by PCR: NEGATIVE
Influenza B by PCR: NEGATIVE
SARS Coronavirus 2 by RT PCR: NEGATIVE

## 2020-04-11 LAB — PHOSPHORUS: Phosphorus: 3.4 mg/dL (ref 2.5–4.6)

## 2020-04-11 LAB — MAGNESIUM: Magnesium: 1.8 mg/dL (ref 1.7–2.4)

## 2020-04-11 LAB — TROPONIN I (HIGH SENSITIVITY)
Troponin I (High Sensitivity): 30 ng/L — ABNORMAL HIGH (ref <18)
Troponin I (High Sensitivity): 32 ng/L — ABNORMAL HIGH (ref <18)

## 2020-04-11 LAB — CREATININE, SERUM
Creatinine, Ser: 1.03 mg/dL (ref 0.61–1.24)
GFR, Estimated: >60 mL/min (ref >60)

## 2020-04-11 MED ORDER — ZIPRASIDONE MESYLATE 20 MG IM SOLR
20.0000 mg | Freq: Once | INTRAMUSCULAR | Status: AC
  Administered 2020-04-11: 20 mg via INTRAMUSCULAR
  Filled 2020-04-11: qty 20

## 2020-04-11 MED ORDER — IOHEXOL 300 MG/ML  SOLN
100.0000 mL | Freq: Once | INTRAMUSCULAR | Status: AC | PRN
  Administered 2020-04-11: 100 mL via INTRAVENOUS

## 2020-04-11 MED ORDER — SODIUM CHLORIDE 0.9 % IV BOLUS
250.0000 mL | Freq: Once | INTRAVENOUS | Status: AC
  Administered 2020-04-11: 250 mL via INTRAVENOUS

## 2020-04-11 MED ORDER — LACTATED RINGERS IV SOLN: INTRAVENOUS | Status: DC

## 2020-04-11 MED ORDER — HALOPERIDOL LACTATE 5 MG/ML IJ SOLN
5.0000 mg | Freq: Once | INTRAMUSCULAR | Status: AC
  Administered 2020-04-11: 5 mg via INTRAVENOUS
  Filled 2020-04-11: qty 1

## 2020-04-11 MED ORDER — ENOXAPARIN SODIUM 40 MG/0.4ML ~~LOC~~ SOLN
40.0000 mg | SUBCUTANEOUS | Status: DC
  Administered 2020-04-12 – 2020-04-16 (×4): 40 mg via SUBCUTANEOUS
  Filled 2020-04-11 (×4): qty 0.4

## 2020-04-11 MED ORDER — LORAZEPAM 2 MG/ML IJ SOLN
0.5000 mg | Freq: Once | INTRAMUSCULAR | Status: DC
  Filled 2020-04-11: qty 1

## 2020-04-11 MED ORDER — LORAZEPAM 2 MG/ML IJ SOLN
0.5000 mg | Freq: Once | INTRAMUSCULAR | Status: AC
  Administered 2020-04-11: 0.5 mg via INTRAVENOUS
  Filled 2020-04-11: qty 1

## 2020-04-11 MED ORDER — ACETAMINOPHEN 650 MG RE SUPP
650.0000 mg | Freq: Four times a day (QID) | RECTAL | Status: DC | PRN
  Administered 2020-04-16 – 2020-04-17 (×3): 650 mg via RECTAL
  Filled 2020-04-11 (×3): qty 1

## 2020-04-11 MED ORDER — INSULIN ASPART 100 UNIT/ML ~~LOC~~ SOLN
0.0000 [IU] | Freq: Three times a day (TID) | SUBCUTANEOUS | Status: DC
  Administered 2020-04-12: 1 [IU] via SUBCUTANEOUS
  Administered 2020-04-12: 2 [IU] via SUBCUTANEOUS
  Administered 2020-04-13 – 2020-04-14 (×3): 1 [IU] via SUBCUTANEOUS
  Administered 2020-04-15 (×2): 2 [IU] via SUBCUTANEOUS
  Filled 2020-04-11: qty 0.09

## 2020-04-11 MED ORDER — METOPROLOL TARTRATE 5 MG/5ML IV SOLN
5.0000 mg | Freq: Four times a day (QID) | INTRAVENOUS | Status: DC | PRN
  Administered 2020-04-14: 5 mg via INTRAVENOUS
  Filled 2020-04-11: qty 5

## 2020-04-11 MED ORDER — LORAZEPAM 2 MG/ML IJ SOLN
1.0000 mg | INTRAMUSCULAR | Status: DC | PRN
  Administered 2020-04-12 (×3): 1 mg via INTRAVENOUS
  Filled 2020-04-11 (×4): qty 1

## 2020-04-11 MED ORDER — ZIPRASIDONE MESYLATE 20 MG IM SOLR: 15.0000 mg | Freq: Once | INTRAMUSCULAR | Status: DC

## 2020-04-11 MED ORDER — ACETAMINOPHEN 325 MG PO TABS: 650.0000 mg | ORAL_TABLET | Freq: Four times a day (QID) | ORAL | Status: DC | PRN

## 2020-04-11 MED ORDER — INSULIN ASPART 100 UNIT/ML ~~LOC~~ SOLN
0.0000 [IU] | Freq: Every day | SUBCUTANEOUS | Status: DC
  Filled 2020-04-11: qty 0.05
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
## 2020-04-11 NOTE — Progress Notes (Addendum)
.   Transition of Care [facility]) - Emergency Department Mini Assessment   Patient Details  Name: Remy Voiles MRN: [id number] Date of Birth: August 11, 1940  Transition of Care [facility]) CM/SW Contact:    Elliot Cousin, RN Phone Number: [phone] 04/17/2020, 4:18 PM   Clinical Narrative: TOC CM spoke to pt and caregiver, Sherol via phone. Pt is requesting to go home. States he has wheelchair, scooter, rolling walker and ramp. Offered choice for [facility]. Did not have preference. Pt states his girlfriend, Anda Latina is with him 24 hours a day/7 days per week. She is paid by the [place] and he wants to go home with [facility]. Gave permission to speak to dtr, [name].   Contacted [facility] with new referral.    ED Mini Assessment: What brought you to the Emergency Department? : falls  Barriers to Discharge: No Barriers Identified     Means of departure: Car  Interventions which prevented an admission or readmission: Home Health Consult or Services    Patient Contact and Communications Key Contact 1: Sherol Tourant   Spoke with: Caregiver Contact Date: 04/09/2020,   Contact time: 1607      Patient states their goals for this hospitalization and ongoing recovery are:: patient is wanting to go home CMS Medicare.gov Compare Post Acute Care list provided to:: Patient Choice offered to / list presented to : Patient  Admission diagnosis:  FALL/  HEAD PAIN, HARD OF HEARING Patient Active Problem List   Diagnosis Date Noted  . Essential hypertension 11/23/2016  . Chronic systolic CHF (congestive heart failure) (HCC) 09/11/2016  . Uncontrolled type 2 diabetes mellitus with hyperglycemia, without long-term current use of insulin (HCC) 09/11/2016  . Esophageal dysmotility 09/11/2016  . Thyroid nodule 09/08/2016  . Chest pressure 05/10/2016  . Insomnia 05/10/2016  . NICM (nonischemic cardiomyopathy) (HCC) 02/06/2015  . Thoracic aortic aneurysm without rupture (HCC) 02/06/2015  .  Aortic insufficiency 02/06/2015  . Hyperlipidemia 02/06/2015  . 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 Malfunction of penile prosthesis (HCC) 09/24/2011   PCP:  [facility] Pharmacy:   [facility] - [place], [place] - [address] [place] [address] Phone: [phone] Fax: [phone]  [facility] [place], [place] - [address] [id number] [facility] [place] [address] Phone: [phone] Fax: [phone]  [facility] [phone] [place], [place] - [address] AT [facility] [address] [place] [address] Phone: [phone] Fax: [phone]

## 2020-04-11 NOTE — ED Notes (Signed)
Pt on toilet and did not allow me to obtain a urine sample, RN Sam informed and we'll try again next time. Family member informed too.

## 2020-04-11 NOTE — ED Notes (Signed)
C/o increased falling, and diff hearing which is new

## 2020-04-11 NOTE — ED Notes (Signed)
CT notified that pt is ready for CT.

## 2020-04-11 NOTE — ED Notes (Addendum)
Pt refused Ativan. Pt stated " Do not give medicine, I will have you arrested". Provider aware Optometrist.

## 2020-04-11 NOTE — ED Provider Notes (Addendum)
[facility] EMERGENCY DEPARTMENT Provider Note   CSN: [id number] Arrival date & time: 04/10/2020  1048     History Chief Complaint  Patient presents with  . Fall    Multiple falls    Alan [name] is a 79 y.o. male history of A. fib, AAA, CHF, diabetes, GERD, thoracic aortic aneurysm, nonischemic cardiomyopathy, hyperlipidemia.  Patient arrives today with priors for falls.  Patient has had several falls over the past few days his most recent fall was 3 days ago he struck his head during that time.  Takes aspirin no blood thinner use.  Patient reports headache and right rib pain since his falls pain is aching constant nonradiating worsened with movement palpation no alleviating factors.  Patient does not know why he falls.  Additional history was obtained from caregiver at bedside, she reports that patient will fall asleep while walking with his walker and fall backwards.  Patient refers to his caregiver as his wife, caregiver reports that they are not married she is only has caregiver  No recent illness, no fever or chills, no vision changes, numbness/weakness, tingling, chest pain/shortness of breath, dysuria/hematuria or any additional concerns.  HPI     Past Medical History:  Diagnosis Date  . A-fib (HCC)   . AAA (abdominal aortic aneurysm) (HCC) 2018  . Arthritis   . CHF (congestive heart failure) (HCC)   . Chronic systolic (congestive) heart failure (HCC)   . Chronic systolic CHF (congestive heart failure) (HCC)   . Claustrophobia    EXTREME   . Diabetes mellitus ORAL MED  . Diverticulosis   . ED (erectile dysfunction)   . GAD (generalized anxiety disorder)   . GERD (gastroesophageal reflux disease)   . Glaucoma of right eye   . History of diverticulitis   . History of nuclear stress test    MV 1/18: EF 33, no ischemia; high risk  . History of prostate cancer   . Lung nodules   . Malfunction of artificial urethral sphincter (HCC)   . Malfunction of  penile prosthesis (HCC)   . NICM (nonischemic cardiomyopathy) (HCC) 02/06/2015   LHC 11/14: EF 35, no significant CAD // Echo 7/16: Diff HK worse in inf base, EF 40%, Gr 1 DD, mild AI, mild MR, mod LAE // Echo 5/18: Moderate concentric LVH, EF 35-40, inferolateral and anterolateral AK, normal wall motion, moderate AI, moderate MR, moderate LAE, mild PI  . Nocturia   . Presbyesophagus 2018  . Snoring   . SUI (stress urinary incontinence), male   . Thoracic aortic aneurysm without rupture (HCC) 02/06/2015   Chest CTA 1/18:  5.1 cm ascending aortic aneurysm, mild cardiomegaly, mild coronary artery calcification, upper lobe small airway disease, cholelithiasis, 16 mm right thyroid nodule  . Thyroid nodule     Patient Active Problem List   Diagnosis Date Noted  . Essential hypertension 11/23/2016  . Chronic systolic CHF (congestive heart failure) (HCC) 09/11/2016  . Uncontrolled type 2 diabetes mellitus with hyperglycemia, without long-term current use of insulin (HCC) 09/11/2016  . Esophageal dysmotility 09/11/2016  . Thyroid nodule 09/08/2016  . Chest pressure 05/10/2016  . Insomnia 05/10/2016  . NICM (nonischemic cardiomyopathy) (HCC) 02/06/2015  . Thoracic aortic aneurysm without rupture (HCC) 02/06/2015  . Aortic insufficiency 02/06/2015  . Hyperlipidemia 02/06/2015  . 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 Malfunction of penile prosthesis (HCC) 09/24/2011    Past Surgical History:  Procedure Laterality Date  . ANKLE SURGERY Bilateral   . BALLOON  DILATION N/A 12/04/2019   Procedure: BALLOON DILATION;  Surgeon: Sherrilyn Rist, MD;  Location: [facility];  Service: Gastroenterology;  Laterality: N/A;  . BIOPSY THYROID    . COLONOSCOPY  2010  . ESOPHAGOGASTRODUODENOSCOPY N/A 12/04/2019   Procedure: ESOPHAGOGASTRODUODENOSCOPY (EGD);  Surgeon: Sherrilyn Rist, MD;  Location: [facility];  Service: Gastroenterology;  Laterality: N/A;  . I & D/ ORIF RIGHT INDEX FINGER   01-19-2005  . INSERTION INFLATABLE PENILE PROSTHESIS  06-22-1999   MENTOR ALPHA I  . IR GENERIC HISTORICAL  06/17/2016   IR RADIOLOGIST EVAL & MGMT 06/17/2016 Jolaine Click, MD [facility] INTERV RAD  . LEFT HEART CATHETERIZATION WITH CORONARY ANGIOGRAM N/A 03/15/2013   Procedure: LEFT HEART CATHETERIZATION WITH CORONARY ANGIOGRAM;  Surgeon: [name], MD;  Location: [facility] CATH LAB;  Service: Cardiovascular;  Laterality: N/A;  . PENILE PROSTHESIS IMPLANT  09/24/2011   Procedure: PENILE PROTHESIS INFLATABLE;  Surgeon: [name], MD;  Location: [facility];  Service: Urology;  Laterality: N/A;  REPLACEMENT OF INFLATABLE PENILE PROTHESIS (COLOPLAST)    . PLACEMENT ARTIFICIAL URINARY SPHINTER  01-20-2001   [id number]  . REMOVAL AND REPLACEMENT GU SPHINTER CUFF  05-15-2004   STRESS URINARY INCONTINENCE  . RIGHT HEART CATH N/A 06/28/2019   Procedure: RIGHT HEART CATH;  Surgeon: Laurey Morale, MD;  Location: [facility] INVASIVE CV LAB;  Service: Cardiovascular;  Laterality: N/A;       Family History  Problem Relation Age of Onset  . Heart Problems Mother   . Heart attack Father   . Healthy Sister        NO HEART ISSUES  . Healthy Sister        NO HEART ISSUES    Social History   Tobacco Use  . Smoking status: Never Smoker  . Smokeless tobacco: Never Used  Vaping Use  . Vaping Use: Never used  Substance Use Topics  . Alcohol use: No  . Drug use: No    Home Medications Prior to Admission medications   Medication Sig Start Date End Date Taking? Authorizing Provider  Alogliptin Benzoate 25 MG TABS Take 12.5 mg by mouth daily.    [provider]  aspirin EC 81 MG tablet Take 1 tablet (81 mg total) by mouth daily. 07/05/19   Laurey Morale, MD  brimonidine (ALPHAGAN) 0.2 % ophthalmic solution Place 1 drop into the right eye in the morning and at bedtime.  03/21/18   [provider]  calcium carbonate (TUMS - DOSED IN MG ELEMENTAL CALCIUM) 500 MG chewable  tablet Chew 1 tablet by mouth daily as needed for indigestion or heartburn.    [provider]  Cholecalciferol (VITAMIN D) 2000 UNITS CAPS Take 2,000 Units by mouth at bedtime.     [provider]  clobetasol (TEMOVATE) 0.05 % external solution Apply 1 application topically daily as needed (Bump on back).  08/22/19   [provider]  docusate sodium (COLACE) 100 MG capsule Take 100 mg by mouth 2 (two) times daily.    [provider]  dorzolamide-timolol (COSOPT) 22.3-6.8 MG/ML ophthalmic solution Place 1 drop into the right eye 2 (two) times daily.  11/24/15   [provider]  empagliflozin (JARDIANCE) 25 MG TABS tablet One half tab daily 03/06/20   Robbie Lis M, PA-C  glipiZIDE (GLUCOTROL) 10 MG tablet Take 5-10 mg by mouth See admin instructions. Take 10 mg in the morning and 5 mg at bedtime    [provider]  LANTUS SOLOSTAR 100 UNIT/ML Solostar Pen Inject 18 Units into the skin at bedtime.  05/25/19   [provider]  latanoprost (XALATAN) 0.005 % ophthalmic solution Place 1 drop into the left eye at bedtime.     [provider]  meclizine (ANTIVERT) 25 MG tablet Take 25 mg by mouth in the morning and at bedtime.  08/22/19   [provider]  metFORMIN (GLUCOPHAGE) 500 MG tablet Take 500-1,000 mg by mouth See admin instructions. Take 1000 mg in the morning and 500 mg at bedtime    [provider]  methocarbamol (ROBAXIN) 500 MG tablet Take 500 mg by mouth every 6 (six) hours as needed for muscle spasms.    [provider]  metoprolol succinate (TOPROL-XL) 25 MG 24 hr tablet Take 12.5 mg by mouth 2 (two) times daily.    [provider]  Netarsudil-Latanoprost (ROCKLATAN) 0.02-0.005 % SOLN Place 1 drop into the right eye at bedtime.    [provider]  omeprazole (PRILOSEC) 20 MG capsule Take 20 mg by mouth daily.    [provider]  polyethylene glycol (MIRALAX /  GLYCOLAX) 17 g packet Take 17 g by mouth daily as needed for moderate constipation.    [provider]  rosuvastatin (CRESTOR) 5 MG tablet Take 1 tablet (5 mg total) by mouth at bedtime. 07/05/19 07/04/20  Laurey Morale, MD  spironolactone (ALDACTONE) 25 MG tablet Take 1 tablet (25 mg total) by mouth daily. 09/12/16   Dhungel, Theda Belfast, MD  torsemide (DEMADEX) 20 MG tablet Take 1 tablet (20 mg total) by mouth 2 (two) times daily. 02/07/20   Robbie Lis M, PA-C  traMADol (ULTRAM) 50 MG tablet Take 50 mg by mouth at bedtime.     [provider]  vitamin B-12 (CYANOCOBALAMIN) 500 MCG tablet Take 500 mcg by mouth in the morning and at bedtime.     [provider]    Allergies    Ezetimibe, Statins, Levaquin [levofloxacin], Lacosamide, Other, Pravastatin, and Ropinirole  Review of Systems   Review of Systems Ten systems are reviewed and are negative for acute change except as noted in the HPI  Physical Exam Updated Vital Signs BP 127/64 (BP Location: Left Arm)   Pulse 81   Temp 98 F (36.7 C) (Oral)   Resp 18   Ht 5\' 10"  (1.778 m)   Wt 98.9 kg   SpO2 96%   BMI 31.29 kg/m   Physical Exam Constitutional:      General: He is not in acute distress.    Appearance: Normal appearance. He is well-developed. He is not ill-appearing or diaphoretic.  HENT:     Head: Normocephalic. Contusion present. No laceration.     Jaw: There is normal jaw occlusion.  Eyes:     General: Vision grossly intact. Gaze aligned appropriately.     Pupils: Pupils are equal, round, and reactive to light.  Neck:     Trachea: Trachea and phonation normal.  Pulmonary:     Effort: Pulmonary effort is normal. No respiratory distress.  Abdominal:     General: There is no distension.     Palpations: Abdomen is soft.     Tenderness: There is no abdominal tenderness. There is no guarding or rebound.  Musculoskeletal:        General: Normal range of motion.     Cervical back: Normal  range of motion.     Comments: No midline C/T/L spinal tenderness to palpation, no paraspinal  muscle tenderness, no deformity, crepitus, or step-off noted.  Pelvis stable to compression bilaterally without pain.  All major joints of the bilateral upper and lower extremities mobilized without crepitus deformity or pain.  Multiple bruises of varying age over body.  Most significant at the arms, scalp and right ribs.   Skin:    General: Skin is warm and dry.       Neurological:     Mental Status: He is alert.     GCS: GCS eye subscore is 4. GCS verbal subscore is 5. GCS motor subscore is 6.     Comments: Patient hard of hearing, yelling, speech is clear and goal oriented, follows commands Major Cranial nerves without deficit, no facial droop Moves extremities without ataxia, coordination intact Alert to self only.  Believes he is in [place].  Believes the year is 22.  He does not know what city he is in.  Psychiatric:        Behavior: Behavior normal.     ED Results / Procedures / Treatments   Labs (all labs ordered are listed, but only abnormal results are displayed) Labs Reviewed  CBC WITH DIFFERENTIAL/PLATELET - Abnormal; Notable for the following components:      Result Value   Hemoglobin 11.5 (*)    HCT 36.0 (*)    All other components within normal limits  BASIC METABOLIC PANEL - Abnormal; Notable for the following components:   Glucose, Bld 210 (*)    All other components within normal limits  URINALYSIS, ROUTINE W REFLEX MICROSCOPIC - Abnormal; Notable for the following components:   Specific Gravity, Urine <1.005 (*)    Glucose, UA >=500 (*)    All other components within normal limits  URINALYSIS, MICROSCOPIC (REFLEX) - Abnormal; Notable for the following components:   Bacteria, UA RARE (*)    All other components within normal limits  TROPONIN I (HIGH SENSITIVITY) - Abnormal; Notable for the following components:   Troponin I (High Sensitivity) 30 (*)    All  other components within normal limits  TROPONIN I (HIGH SENSITIVITY) - Abnormal; Notable for the following components:   Troponin I (High Sensitivity) 32 (*)    All other components within normal limits    EKG EKG Interpretation  Date/Time:  Friday April 11 2020 12:21:42 EST Ventricular Rate:  76 PR Interval:    QRS Duration: 179 QT Interval:  459 QTC Calculation: 517 R Axis:   -113 Text Interpretation: Sinus rhythm Prolonged PR interval RBBB and LAFB Probable anteroseptal infarct, old Confirmed by Pricilla Loveless [phone]) on 04/22/2020 1:26:38 PM   Radiology CT Head Wo Contrast  Result Date: 04/23/2020 CLINICAL DATA:  Facial trauma. Neck trauma. Additional history provided: Multiple falls over the past week hitting head, last fall 3 days ago, headache, new difficulty hearing. EXAM: CT HEAD WITHOUT CONTRAST CT CERVICAL SPINE WITHOUT CONTRAST TECHNIQUE: Multidetector CT imaging of the head and cervical spine was performed following the standard protocol without intravenous contrast. Multiplanar CT image reconstructions of the cervical spine were also generated. COMPARISON:  CT head 08/29/2012. Radiographs of the cervical spine 08/29/2012. FINDINGS: CT HEAD FINDINGS Brain: Mild cerebral atrophy. There is no acute intracranial hemorrhage. No demarcated cortical infarct. No extra-axial fluid collection. No evidence of intracranial mass. No midline shift. Vascular: No hyperdense vessel.  Atherosclerotic calcifications. Skull: Normal. Negative for fracture or focal lesion. Sinuses/Orbits: Visualized orbits show no acute finding. Mild frontal, ethmoid and left maxillary sinus mucosal thickening. Small mucous retention cysts within the right  sphenoid and left maxillary sinuses. Other: Small left parietal scalp hematoma (series 4, image 30). CT CERVICAL SPINE FINDINGS Alignment: Mild reversal of the expected cervical lordosis. 3 mm C3-C4 grade 1 anterolisthesis. Skull base and vertebrae: The  basion-dental and atlanto-dental intervals are maintained.No evidence of acute fracture to the cervical spine. Soft tissues and spinal canal: No prevertebral fluid or swelling. No visible canal hematoma. Disc levels: Cervical spondylosis with multilevel disc space narrowing, disc bulges, uncovertebral hypertrophy and facet arthrosis. Fusion across the anterior and posterior disc space at C2-C3 and C4-C5. Facet joint ankylosis bilaterally at C2-C3 and on the left at C4-C5. Upper chest: No consolidation within the imaged lung apices. No visible pneumothorax. Other: 2.1 cm right thyroid lobe nodule. IMPRESSION: CT head: 1. No evidence of acute intracranial abnormality. 2. Small left parietal scalp hematoma. 3. Mild cerebral atrophy. 4. Mild paranasal sinus disease as described. CT cervical spine: 1. No evidence of acute fracture to the cervical spine. 2. C3-C4 grade 1 anterolisthesis. 3. Nonspecific reversal of the expected cervical lordosis. 4. Cervical spondylosis as described. C2-C3 and C4-C5 fusion. 5. Previously biopsied 2.1 cm right thyroid lobe nodule. Electronically Signed   By: Jackey Loge DO   On: 04/02/2020 12:10   CT Cervical Spine Wo Contrast  Result Date: 04/03/2020 CLINICAL DATA:  Facial trauma. Neck trauma. Additional history provided: Multiple falls over the past week hitting head, last fall 3 days ago, headache, new difficulty hearing. EXAM: CT HEAD WITHOUT CONTRAST CT CERVICAL SPINE WITHOUT CONTRAST TECHNIQUE: Multidetector CT imaging of the head and cervical spine was performed following the standard protocol without intravenous contrast. Multiplanar CT image reconstructions of the cervical spine were also generated. COMPARISON:  CT head 08/29/2012. Radiographs of the cervical spine 08/29/2012. FINDINGS: CT HEAD FINDINGS Brain: Mild cerebral atrophy. There is no acute intracranial hemorrhage. No demarcated cortical infarct. No extra-axial fluid collection. No evidence of intracranial mass. No  midline shift. Vascular: No hyperdense vessel.  Atherosclerotic calcifications. Skull: Normal. Negative for fracture or focal lesion. Sinuses/Orbits: Visualized orbits show no acute finding. Mild frontal, ethmoid and left maxillary sinus mucosal thickening. Small mucous retention cysts within the right sphenoid and left maxillary sinuses. Other: Small left parietal scalp hematoma (series 4, image 30). CT CERVICAL SPINE FINDINGS Alignment: Mild reversal of the expected cervical lordosis. 3 mm C3-C4 grade 1 anterolisthesis. Skull base and vertebrae: The basion-dental and atlanto-dental intervals are maintained.No evidence of acute fracture to the cervical spine. Soft tissues and spinal canal: No prevertebral fluid or swelling. No visible canal hematoma. Disc levels: Cervical spondylosis with multilevel disc space narrowing, disc bulges, uncovertebral hypertrophy and facet arthrosis. Fusion across the anterior and posterior disc space at C2-C3 and C4-C5. Facet joint ankylosis bilaterally at C2-C3 and on the left at C4-C5. Upper chest: No consolidation within the imaged lung apices. No visible pneumothorax. Other: 2.1 cm right thyroid lobe nodule. IMPRESSION: CT head: 1. No evidence of acute intracranial abnormality. 2. Small left parietal scalp hematoma. 3. Mild cerebral atrophy. 4. Mild paranasal sinus disease as described. CT cervical spine: 1. No evidence of acute fracture to the cervical spine. 2. C3-C4 grade 1 anterolisthesis. 3. Nonspecific reversal of the expected cervical lordosis. 4. Cervical spondylosis as described. C2-C3 and C4-C5 fusion. 5. Previously biopsied 2.1 cm right thyroid lobe nodule. Electronically Signed   By: Jackey Loge DO   On: 04/06/2020 12:10   DG Pelvis Portable  Result Date: 04/18/2020 CLINICAL DATA:  Recent falls with pelvic pain, initial encounter EXAM: PORTABLE  PELVIS 1-2 VIEWS COMPARISON:  None. FINDINGS: Pelvic ring is intact. Postsurgical changes are noted. Degenerative  changes of the hip joints are seen bilaterally. No acute fracture is seen. No soft tissue abnormality is noted. IMPRESSION: No acute abnormality noted. Electronically Signed   By: Alcide Clever M.D.   On: 03/31/2020 11:54   CT CHEST ABDOMEN PELVIS W CONTRAST  Result Date: 04/14/2020 CLINICAL DATA:  Multiple falls over the past week. Initial encounter. EXAM: CT CHEST, ABDOMEN, AND PELVIS WITH CONTRAST TECHNIQUE: Multidetector CT imaging of the chest, abdomen and pelvis was performed following the standard protocol during bolus administration of intravenous contrast. CONTRAST:  OMNIPAQUE IOHEXOL 300 MG/ML  SOLN COMPARISON:  CT chest 09/19/2019.  CT right hip 07/26/2019. 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 No dissection is identified. There is cardiomegaly. Aortic and coronary atherosclerosis are again seen. No pericardial effusion. Mediastinum/Nodes: No lymphadenopathy. The walls of the esophagus are mildly thickened and there is some fluid within the esophagus. 1.5 cm low attenuating lesion the right lobe of the thyroid is unchanged and has been evaluated previously with ultrasound. Not clinically significant; no follow-up imaging recommended (ref: J Am Coll Radiol. 2015 Feb;12(2): 143-50). Lungs/Pleura: No pleural effusion. There is some dependent atelectasis. No pneumothorax or consolidative process. Subtle 0.8 cm subpleural nodule in the left upper lobe is unchanged on image 67. 0.5 cm subpleural nodule in the right lower lobe on image 85 is also unchanged. Musculoskeletal: The patient has a very mild superior endplate compression fracture of T11 which is new since the prior examination. No bony retropulsion or involvement of the posterior elements is identified. Also seen is a fracture of the posterior arc of T11 medially adjacent to the vertebral body. There is some callus formation about the fracture. No other fracture is identified. CT ABDOMEN  PELVIS FINDINGS Hepatobiliary: The liver is low attenuating. No focal lesion. Stones are seen in the gallbladder. No CT evidence of cholecystitis. Biliary tree is negative. Pancreas: Unremarkable. No pancreatic ductal dilatation or surrounding inflammatory changes. Spleen: Normal in size without focal abnormality. Adrenals/Urinary Tract: Adrenal glands are unremarkable. Kidneys are normal, without renal calculi, focal lesion, or hydronephrosis. Bladder is unremarkable. Stomach/Bowel: Stomach is within normal limits. Appendix appears normal. No evidence of bowel wall thickening, distention, or inflammatory changes. Vascular/Lymphatic: Aortic atherosclerosis. No enlarged abdominal or pelvic lymph nodes. Reproductive: Status post prostatectomy. Penile prosthesis in place. Other: Fat containing inguinal hernias, larger on the right. No change. Musculoskeletal: No acute or focal abnormality. IMPRESSION: Mild T11 superior endplate compression fracture without bony retropulsion or involvement of the posterior elements. There is also a fracture of the medially J adjacent right eleventh rib with some callus formation. The fractures are new since the 09/18/2019 chest CT and most consistent with subacute to late subacute injuries. No other evidence of trauma. Mild thickening of the walls of the esophagus suggestive of inflammatory change. There is some fluid in the esophagus which could be due to poor motility or reflux. 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 Cardiomegaly. Gallstones without evidence of cholecystitis. Fatty infiltration of the liver. Diverticulosis without diverticulitis. Fat containing bilateral inguinal hernias. Status post prostatectomy. Aortic Atherosclerosis (ICD10-I70.0). Calcific coronary artery disease also noted. Electronically Signed   By: Drusilla Kanner M.D.   On: 03/29/2020 14:43   DG Chest Portable 1 View  Result Date: 04/08/2020 CLINICAL DATA:  Fall EXAM: PORTABLE CHEST 1 VIEW  COMPARISON:  2018 FINDINGS: Cardiomegaly similar to  the prior study. Low lung volumes. Probable mild interstitial edema. No significant pleural effusion. No pneumothorax. IMPRESSION: Cardiomegaly and probable mild interstitial edema. Electronically Signed   By: Guadlupe Spanish M.D.   On: 2020/04/13 11:57    Procedures Procedures (including critical care time)  Medications Ordered in ED Medications  ziprasidone (GEODON) injection 20 mg (has no administration in time range)  sodium chloride 0.9 % bolus 250 mL (0 mLs Intravenous Stopped Apr 13, 2020 1532)  iohexol (OMNIPAQUE) 300 MG/ML solution 100 mL (100 mLs Intravenous Contrast Given 04/13/20 1357)  LORazepam (ATIVAN) injection 0.5 mg (0.5 mg Intravenous Given 04/13/20 1331)    ED Course  I have reviewed the triage vital signs and the nursing notes.  Pertinent labs & imaging results that were available during my care of the patient were reviewed by me and considered in my medical decision making (see chart for details).  Clinical Course as of 04-13-20 1634  Fri 04/13/20  1614 Dr. Hanley Ben [BM]  1625 Dr. Donnald Garre [BM]    Clinical Course User Index [BM] Elizabeth Palau   MDM Rules/Calculators/A&P                         Additional history obtained from: 1. Nursing notes from this visit. 2. Caregiver and family. 3. Review of electronic medical records.  Patient had a neurology visit today with Dr. Anne Hahn, under their impression and plan they have diagnosis of SAH, frequent falls and PTSD.  Per their note it appears they spoke with the daughter and the caretaker the patient was not competent to sign out AMA and they advised patient needed imaging of the brain urgently for concern of possible traumatic hemorrhage given recent fall.  They also advised he may need geriatric behavioral health and other psychiatric institutional evaluation.  They suspected underlying dementia as well.  They were given a list of local emergency  departments. -------------- 79 year old male on my initial evaluation was very pleasant he is complaining of rib pain he has had multiple falls over the last few weeks most recently 3 days ago.  He is agreeable to labs and imaging.  No recent illness or infectious symptoms.  Will obtain CT imaging of the head, cervical spine, chest abdomen and pelvis for evaluation of injury after multiple falls.  Patient seen and evaluated by Dr. Criss Alvine who agrees with plan of care.  I ordered, reviewed and interpreted labs which include: CBC without leukocytosis to suggest infection, mild anemia of 11.5 appears baseline. BMP shows no emergent electrolyte derangement, AKI or gap. Urinalysis without evidence of infection. High-sensitivity troponins are 30 and 32.  Suspect this may be patient's baseline he has no chest pain or shortness of breath and has a history of cardiomyopathy, doubt ACS or other emergent cardiopulmonary etiologies at this time.  EKG: Sinus rhythm Prolonged PR interval RBBB and LAFB Probable anteroseptal infarct, old Confirmed by Pricilla Loveless ([id number]) [id number]) on April 13, 2020 1:26:38 PM  CXR:    IMPRESSION:  Cardiomegaly and probable mild interstitial edema.   DG Pelvis:  IMPRESSION:  No acute abnormality noted.   CT Head/Cspine:  IMPRESSION:  CT head:    1. No evidence of acute intracranial abnormality.  2. Small left parietal scalp hematoma.  3. Mild cerebral atrophy.  4. Mild paranasal sinus disease as described.    CT cervical spine:    1. No evidence of acute fracture to the cervical spine.  2. C3-C4 grade 1 anterolisthesis.  3.  Nonspecific reversal of the expected cervical lordosis.  4. Cervical spondylosis as described. C2-C3 and C4-C5 fusion.  5. Previously biopsied 2.1 cm right thyroid lobe nodule.   CT Chest/ABD/Pelvis:  IMPRESSION:  Mild T11 superior endplate compression fracture without bony  retropulsion or involvement of the posterior elements. There is also  a  fracture of the medially J adjacent right eleventh rib with some  callus formation. The fractures are new since the 09/18/2019 chest  CT and most consistent with subacute to late subacute injuries. No  other evidence of trauma.    Mild thickening of the walls of the esophagus suggestive of  inflammatory change. There is some fluid in the esophagus which  could be due to poor motility or reflux.    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    Cardiomegaly.    Gallstones without evidence of cholecystitis.    Fatty infiltration of the liver.    Diverticulosis without diverticulitis.    Fat containing bilateral inguinal hernias.    Status post prostatectomy.    Aortic Atherosclerosis (ICD10-I70.0). Calcific coronary artery  disease also noted.  -------------------------- On reevaluation patient is very agitated threatening nursing staff and requesting to leave.  He is alert only to self at this time.  He believes that he is in [place] currently he reports the year is 88. Patient's daughter [name] reports that patient does seem very confused and reports that he has lived in this area since 60.  I had extensive discussion with patient's full-time caregiver as well as patient's daughter [name].  They are both very concerned about patient's change to mental status of the past several weeks, they are concerned that they are no longer to help take care of him at home.  They report he has been seeming more confused and falling a lot recently and are concerned for his safety.  On my examination as patient is only alert and oriented x1 I do not feel patient is safe for discharge.  Plan of care is admission to hospitalist service for further evaluation and treatment as to mental status change as well as rib fracture and compression fracture.  Patient's daughter [name] is agreeable to plan of care.  Patient became more aggressive and threatened nurse and then threatened to  burn down the facility.  Patient climbing out of bed, for patient and staff safety will give 15 mg intramuscular Geodon.  Discussed plan with patient's daughter and caregiver and they are agreeable. Discussed plan with Dr. Renaye Rakers who is attending after shift change and agrees with plan. ------------------------------ 4:14 PM: Consulted hospitalist Dr. Hanley Ben who advised neurology consult.  I did place consult to neurology but canceled as patient had neurology evaluation earlier today.  Do not suspect meningitis or other acute pathologies at this time.  Patient's mental status change appears to be going on for several weeks to months at this point.  Dr. Renaye Rakers spoke with Dr. Hanley Ben as well and the plan now is for ED to ED transfer to [facility] for psychiatric evaluation and placement. - 4:25 PM: Discussed case with [facility] ER attending physician Dr. Clarice Pole who accepted patient in transfer.  IVC paperwork completed by Dr. Renaye Rakers. Dr. Renaye Rakers has seen and evaluated patient during this visit and agrees with plan of care.  Note: Portions of this report may have been transcribed using voice recognition software. Every effort was made to ensure accuracy; however, inadvertent computerized transcription errors may still be present. Final Clinical Impression(s) /  ED Diagnoses Final diagnoses:  Fall, initial encounter  Confusion and disorientation  Closed fracture of one rib of right side, initial encounter  Compression fracture of body of thoracic vertebra [facility])    Rx / DC Orders ED Discharge Orders    None       Elizabeth Palau 04/17/2020 1635    Bill Salinas, PA-C 03/28/2020 1646    Bill Salinas, PA-C 04/18/2020 1647    Terald Sleeper, MD 04/12/20 1110

## 2020-04-11 NOTE — ED Notes (Signed)
Security at bedside with Family members, pt calm. Alert, sitting in chair.

## 2020-04-11 NOTE — ED Notes (Addendum)
Pt angry, belligerent.  Pt states he doesn't want anything huge done.  He states he wants to go home, that his tailbone hurts, doesn't want to be here.  Pt has aggressive tone, states he is going to burn down the facility.  PA notified, orders received.

## 2020-04-11 NOTE — ED Triage Notes (Signed)
Pt arrives with caregiver who states he has had multiple falls over the past week. States has hit head, last fall 3 days ago. Complains of headache and difficulty hearing (which is new). Hx of brain bleed after a fall. Takes aspirin daily, denies other blood thinners.

## 2020-04-11 NOTE — ED Notes (Signed)
Pt wife at bedside pt has removed two external catheters and now urinated on the floor, pt continues to remove leads for vital monitoring. Pt will be placed in a brief to help contain urine.

## 2020-04-11 NOTE — ED Notes (Signed)
Pt IVC'd per EDP.  Pt having aggressive gestures, stating "do not touch me, you cannot make me stay".  Pt threatening staff verbally.

## 2020-04-11 NOTE — ED Notes (Signed)
Pt family at bedside. VSS, call light with in reach.

## 2020-04-11 NOTE — ED Notes (Signed)
Pt refusing VS. Daughter and caregiver at bedside.

## 2020-04-11 NOTE — ED Notes (Signed)
Placed pt on male purewick  

## 2020-04-11 NOTE — ED Notes (Signed)
IVC has been faxed and confirmed sent. Confirmed by [facility]. Camp to fax confirmation to [facility] ED @ [phone]. Needs to be served

## 2020-04-11 NOTE — ED Notes (Addendum)
Asked to see pt by bedside nurse as pt refused Ativan.  Spoke to patient, asked orientation questions.  Pt is HOH.  Pt oriented to self, place and month, however when answering where he was he answered [place] and gave daughters address and with re-questioning he answered that he was at [facility] hospital.  Pt agitated, has put on his clothing with home caregiver assistance.  States he wants to leave.  Pt states "you cannot hold me here", "call the police".   Pt refuses to stay, states "I want to go home".  Daughter outside of room, asking "Why cant you all be on the same page?"   Informed her that I would be speaking to the EDP.  Spoke to [place] PA, he is filing IVC papers on the patient due to safety concerns.

## 2020-04-11 NOTE — H&P (Signed)
History and Physical   Alan [name] PFX:[id number] DOB: 05/10/1940 DOA: 04/22/2020  Referring MD/NP/PA: Dr. Clarice Pole  PCP: [facility]   [facility]  Patient coming from: Home  Chief Complaint: Confusion  HPI: Alan [name] is a 79 y.o. male with medical history significant of diabetes, PTSD, insomnia, hyperlipidemia, systolic dysfunction CHF, nonischemic cardiomyopathy, hypertension and GERD among other things who was brought in by his caregiver due to worsening mental status.  He is completely delirious agitated.  He was originally at [facility] where he was seen for behavioral changes and frequent falls at home.  He has had fall in the past with subarachnoid hemorrhage which has resolved.  He recovered but continues to to have now more acute onset of confusion and agitation which has been gradual over the last few days to a week.  He was seen at the [facility] and evaluated and apparently was thought to have psych issues related to his PTSD.  Also related to his chronic insomnia.  He was having hallucinations apparently agitation and overall altered mental status.  Patient was evaluated here work-up initially negative.  Consult given to neurology who recommends transfer to [facility] for continued evaluation and treatment..  ED Course: Temperature is 98 blood pressure 159/83 pulse 106 respirate of 26 oxygen sat 87% on room air.  White count 6.4 hemoglobin 11.5 platelets 198 the rest of the chemistry appeared within normal.  Venous pH is 7.357.  Tylenol level less than 10.  Acute viral screen for Covid currently pending.  Chest x-ray and pelvic x-rays mainly shows mild pulmonary edema.  CT cervical spine showed no evidence of acute findings.  C3-C4 grade 1 anterolisthesis and C2-C3 disease.  CT chest showed mild T11 superior endplate compression fracture.  Head CT without contrast showed no acute findings.  Patient therefore being admitted to the  hospital for further evaluation and treatment especially by neurology.  Review of Systems: As per HPI otherwise 10 point review of systems negative.    Past Medical History:  Diagnosis Date  . A-fib (HCC)   . AAA (abdominal aortic aneurysm) (HCC) 2018  . Arthritis   . CHF (congestive heart failure) (HCC)   . Chronic systolic (congestive) heart failure (HCC)   . Chronic systolic CHF (congestive heart failure) (HCC)   . Claustrophobia    EXTREME   . Diabetes mellitus ORAL MED  . Diverticulosis   . ED (erectile dysfunction)   . GAD (generalized anxiety disorder)   . GERD (gastroesophageal reflux disease)   . Glaucoma of right eye   . History of diverticulitis   . History of nuclear stress test    MV 1/18: EF 33, no ischemia; high risk  . History of prostate cancer   . Lung nodules   . Malfunction of artificial urethral sphincter (HCC)   . Malfunction of penile prosthesis (HCC)   . NICM (nonischemic cardiomyopathy) (HCC) 02/06/2015   LHC 11/14: EF 35, no significant CAD // Echo 7/16: Diff HK worse in inf base, EF 40%, Gr 1 DD, mild AI, mild MR, mod LAE // Echo 5/18: Moderate concentric LVH, EF 35-40, inferolateral and anterolateral AK, normal wall motion, moderate AI, moderate MR, moderate LAE, mild PI  . Nocturia   . Presbyesophagus 2018  . Snoring   . SUI (stress urinary incontinence), male   . Thoracic aortic aneurysm without rupture (HCC) 02/06/2015   Chest CTA 1/18:  5.1 cm ascending aortic aneurysm, mild cardiomegaly, mild  coronary artery calcification, upper lobe small airway disease, cholelithiasis, 16 mm right thyroid nodule  . Thyroid nodule     Past Surgical History:  Procedure Laterality Date  . ANKLE SURGERY Bilateral   . BALLOON DILATION N/A 12/04/2019   Procedure: BALLOON DILATION;  Surgeon: Sherrilyn Rist, MD;  Location: [facility] ENDOSCOPY;  Service: Gastroenterology;  Laterality: N/A;  . BIOPSY THYROID    . COLONOSCOPY  2010  . ESOPHAGOGASTRODUODENOSCOPY N/A  12/04/2019   Procedure: ESOPHAGOGASTRODUODENOSCOPY (EGD);  Surgeon: Sherrilyn Rist, MD;  Location: [facility];  Service: Gastroenterology;  Laterality: N/A;  . I & D/ ORIF RIGHT INDEX FINGER  01-19-2005  . INSERTION INFLATABLE PENILE PROSTHESIS  06-22-1999   MENTOR ALPHA I  . IR GENERIC HISTORICAL  06/17/2016   IR RADIOLOGIST EVAL & MGMT 06/17/2016 Jolaine Click, MD [facility] INTERV RAD  . LEFT HEART CATHETERIZATION WITH CORONARY ANGIOGRAM N/A 03/15/2013   Procedure: LEFT HEART CATHETERIZATION WITH CORONARY ANGIOGRAM;  Surgeon: [name], MD;  Location: [facility] CATH LAB;  Service: Cardiovascular;  Laterality: N/A;  . PENILE PROSTHESIS IMPLANT  09/24/2011   Procedure: PENILE PROTHESIS INFLATABLE;  Surgeon: [name], MD;  Location: [facility];  Service: Urology;  Laterality: N/A;  REPLACEMENT OF INFLATABLE PENILE PROTHESIS (COLOPLAST)    . PLACEMENT ARTIFICIAL URINARY SPHINTER  01-20-2001   [id number]  . REMOVAL AND REPLACEMENT GU SPHINTER CUFF  05-15-2004   STRESS URINARY INCONTINENCE  . RIGHT HEART CATH N/A 06/28/2019   Procedure: RIGHT HEART CATH;  Surgeon: Laurey Morale, MD;  Location: [facility] INVASIVE CV LAB;  Service: Cardiovascular;  Laterality: N/A;     reports that he has never smoked. He has never used smokeless tobacco. He reports that he does not drink alcohol and does not use drugs.  Allergies  Allergen Reactions  . Ezetimibe Other (See Comments)    Confusion  . Statins Other (See Comments)    Myalgias  . Levaquin [Levofloxacin]     Can cause problem with aneurism, use PCN or Keflex   . Lacosamide Rash  . Other     Pt is extremely claustrophobic, will have a severe panic attack if he is left alone in a small space including an empty hospital room. Especially after waking up from surgery.   . Pravastatin Palpitations  . Ropinirole     Nightmares     Family History  Problem Relation Age of Onset  . Heart Problems Mother   . Heart attack Father   .  Healthy Sister        NO HEART ISSUES  . Healthy Sister        NO HEART ISSUES     Prior to Admission medications   Medication Sig Start Date End Date Taking? Authorizing Provider  aspirin EC 81 MG tablet Take 1 tablet (81 mg total) by mouth daily. 07/05/19  Yes Laurey Morale, MD  brimonidine (ALPHAGAN) 0.2 % ophthalmic solution Place 1 drop into the right eye in the morning and at bedtime.  03/21/18  Yes [provider]  calcium carbonate (TUMS - DOSED IN MG ELEMENTAL CALCIUM) 500 MG chewable tablet Chew 1 tablet by mouth daily as needed for indigestion or heartburn.   Yes [provider]  Cholecalciferol (VITAMIN D) 2000 UNITS CAPS Take 2,000 Units by mouth at bedtime.    Yes [provider]  clobetasol (TEMOVATE) 0.05 % external solution Apply 1 application topically daily as needed (Bump on back).  08/22/19  Yes [provider]  docusate sodium (COLACE) 100 MG capsule Take 100 mg by mouth 2 (two) times daily.   Yes [provider]  dorzolamide-timolol (COSOPT) 22.3-6.8 MG/ML ophthalmic solution Place 1 drop into the right eye 2 (two) times daily.  11/24/15  Yes [provider]  DULoxetine (CYMBALTA) 30 MG capsule Take 30 mg by mouth daily. 03/28/20  Yes [provider]  empagliflozin (JARDIANCE) 25 MG TABS tablet One half tab daily 03/06/20  Yes Simmons, Brittainy M, PA-C  glipiZIDE (GLUCOTROL) 10 MG tablet Take 5-10 mg by mouth See admin instructions. Take 10 mg in the morning and 5 mg at supper   Yes [provider]  LANTUS SOLOSTAR 100 UNIT/ML Solostar Pen Inject 18 Units into the skin at bedtime.  05/25/19  Yes [provider]  latanoprost (XALATAN) 0.005 % ophthalmic solution Place 1 drop into the left eye at bedtime.    Yes [provider]  meclizine (ANTIVERT) 25 MG tablet Take 25 mg by mouth in the morning and at bedtime.  08/22/19  Yes [provider]  metFORMIN (GLUCOPHAGE) 500 MG  tablet Take 500-1,000 mg by mouth See admin instructions. Take 1000 mg in the morning and 500 mg at bedtime   Yes [provider]  methocarbamol (ROBAXIN) 500 MG tablet Take 500 mg by mouth every 6 (six) hours as needed for muscle spasms.   Yes [provider]  metoprolol succinate (TOPROL-XL) 25 MG 24 hr tablet Take 12.5 mg by mouth 2 (two) times daily.   Yes [provider]  Netarsudil-Latanoprost (ROCKLATAN) 0.02-0.005 % SOLN Place 1 drop into the right eye at bedtime.   Yes [provider]  omeprazole (PRILOSEC) 20 MG capsule Take 20 mg by mouth daily.   Yes [provider]  polyethylene glycol (MIRALAX / GLYCOLAX) 17 g packet Take 17 g by mouth daily as needed for moderate constipation.   Yes [provider]  rosuvastatin (CRESTOR) 5 MG tablet Take 1 tablet (5 mg total) by mouth at bedtime. 07/05/19 07/04/20 Yes Laurey Morale, MD  spironolactone (ALDACTONE) 25 MG tablet Take 1 tablet (25 mg total) by mouth daily. 09/12/16  Yes Dhungel, Nishant, MD  torsemide (DEMADEX) 20 MG tablet Take 1 tablet (20 mg total) by mouth 2 (two) times daily. 02/07/20  Yes Simmons, Brittainy M, PA-C  traMADol (ULTRAM) 50 MG tablet Take 50 mg by mouth at bedtime.    Yes [provider]  vitamin B-12 (CYANOCOBALAMIN) 500 MCG tablet Take 500 mcg by mouth in the morning and at bedtime.    Yes [provider]  Alogliptin Benzoate 25 MG TABS Take 12.5 mg by mouth daily. Patient not taking: No sig reported    [provider]  lidocaine (LIDODERM) 5 % Place 1 patch onto the skin daily. 03/28/20   [provider]    Physical Exam: Vitals:   Apr 24, 2020 2045 2020-04-24 2046 Apr 24, 2020 2100 24-Apr-2020 2219  BP:  (!) 159/68 (!) 159/68 (!) 139/98  Pulse:  (!) 50 77 92  Resp:  (!) Temp:      TempSrc:      SpO2: (!) 87% 92% (!) 88% 94%  Weight:      Height:          Constitutional: Confused, agitated, delirious Vitals:    04-24-20 2045 April 24, 2020 2046 2020-04-24 2100 04-24-20 2219  BP:  (!) 159/68 (!) 159/68 (!) 139/98  Pulse:  (!) 50 77 92  Resp:  Marland Kitchen)  Temp:      TempSrc:      SpO2: (!) 87% 92% (!) 88% 94%  Weight:      Height:       Eyes: PERRL, lids and conjunctivae normal ENMT: Mucous membranes are dry. Posterior pharynx clear of any exudate or lesions.Normal dentition.  Neck: normal, supple, no masses, no thyromegaly Respiratory: clear to auscultation bilaterally, no wheezing, no crackles. Normal respiratory effort. No accessory muscle use.  Cardiovascular: Regular rate and rhythm, no murmurs / rubs / gallops. No extremity edema. 2+ pedal pulses. No carotid bruits.  Abdomen: no tenderness, no masses palpated. No hepatosplenomegaly. Bowel sounds positive.  Musculoskeletal: no clubbing / cyanosis. No joint deformity upper and lower extremities. Good ROM, no contractures. Normal muscle tone.  Skin: no rashes, lesions, ulcers. No induration Neurologic: CN 2-12 grossly intact. Sensation intact, DTR normal. Strength 5/5 in all 4.  Psychiatric: Agitated, moving around in bed, to have no insight and completely disoriented x3.    Labs on Admission: I have personally reviewed following labs and imaging studies  CBC: Recent Labs  Lab 04/12/2020 1129  WBC 6.4  NEUTROABS 3.9  HGB 11.5*  HCT 36.0*  MCV 82.9  PLT 198   Basic Metabolic Panel: Recent Labs  Lab 04/18/2020 1129 04/14/2020 2209  NA 136  --   K 4.6  --   CL 103  --   CO2 24  --   GLUCOSE 210*  --   BUN 21  --   CREATININE 1.18  --   CALCIUM 9.1  --   MG  --  1.8  PHOS  --  3.4   GFR: Estimated Creatinine Clearance: 60.9 mL/min (by C-G formula based on SCr of 1.18 mg/dL). Liver Function Tests: Recent Labs  Lab 04/10/2020 2209  AST 20  ALT 20  ALKPHOS 90  BILITOT 0.8  PROT 7.2  ALBUMIN 3.9   No results for input(s): LIPASE, AMYLASE in the last 168 hours. Recent Labs  Lab 04/02/2020 2208  AMMONIA 21   Coagulation  Profile: No results for input(s): INR, PROTIME in the last 168 hours. Cardiac Enzymes: No results for input(s): CKTOTAL, CKMB, CKMBINDEX, TROPONINI in the last 168 hours. BNP (last 3 results) Recent Labs    04/18/19 1510  PROBNP 975*   HbA1C: No results for input(s): HGBA1C in the last 72 hours. CBG: No results for input(s): GLUCAP in the last 168 hours. Lipid Profile: No results for input(s): CHOL, HDL, LDLCALC, TRIG, CHOLHDL, LDLDIRECT in the last 72 hours. Thyroid Function Tests: No results for input(s): TSH, T4TOTAL, FREET4, T3FREE, THYROIDAB in the last 72 hours. Anemia Panel: No results for input(s): VITAMINB12, FOLATE, FERRITIN, TIBC, IRON, RETICCTPCT in the last 72 hours. Urine analysis:    Component Value Date/Time   COLORURINE YELLOW 04/10/2020 1423   APPEARANCEUR CLEAR 04/25/2020 1423   LABSPEC <1.005 (L) 04/18/2020 1423   PHURINE 6.5 04/06/2020 1423   GLUCOSEU >=500 (A) 03/30/2020 1423   HGBUR NEGATIVE 04/06/2020 1423   BILIRUBINUR NEGATIVE 04/12/2020 1423   KETONESUR NEGATIVE 03/28/2020 1423   PROTEINUR NEGATIVE 04/06/2020 1423   NITRITE NEGATIVE 04/24/2020 1423   LEUKOCYTESUR NEGATIVE 04/14/2020 1423   Sepsis Labs: (procalcitonin:4,lacticidven:4) )No results found for this or any previous visit (from the past 240 hour(s)).   Radiological Exams on Admission: CT Head Wo Contrast  Result Date: 04/09/2020 CLINICAL DATA:  Facial trauma. Neck trauma. Additional history provided: Multiple falls over the past week hitting head, last fall  3 days ago, headache, new difficulty hearing. EXAM: CT HEAD WITHOUT CONTRAST CT CERVICAL SPINE WITHOUT CONTRAST TECHNIQUE: Multidetector CT imaging of the head and cervical spine was performed following the standard protocol without intravenous contrast. Multiplanar CT image reconstructions of the cervical spine were also generated. COMPARISON:  CT head 08/29/2012. Radiographs of the cervical spine 08/29/2012. FINDINGS: CT  HEAD FINDINGS Brain: Mild cerebral atrophy. There is no acute intracranial hemorrhage. No demarcated cortical infarct. No extra-axial fluid collection. No evidence of intracranial mass. No midline shift. Vascular: No hyperdense vessel.  Atherosclerotic calcifications. Skull: Normal. Negative for fracture or focal lesion. Sinuses/Orbits: Visualized orbits show no acute finding. Mild frontal, ethmoid and left maxillary sinus mucosal thickening. Small mucous retention cysts within the right sphenoid and left maxillary sinuses. Other: Small left parietal scalp hematoma (series 4, image 30). CT CERVICAL SPINE FINDINGS Alignment: Mild reversal of the expected cervical lordosis. 3 mm C3-C4 grade 1 anterolisthesis. Skull base and vertebrae: The basion-dental and atlanto-dental intervals are maintained.No evidence of acute fracture to the cervical spine. Soft tissues and spinal canal: No prevertebral fluid or swelling. No visible canal hematoma. Disc levels: Cervical spondylosis with multilevel disc space narrowing, disc bulges, uncovertebral hypertrophy and facet arthrosis. Fusion across the anterior and posterior disc space at C2-C3 and C4-C5. Facet joint ankylosis bilaterally at C2-C3 and on the left at C4-C5. Upper chest: No consolidation within the imaged lung apices. No visible pneumothorax. Other: 2.1 cm right thyroid lobe nodule. IMPRESSION: CT head: 1. No evidence of acute intracranial abnormality. 2. Small left parietal scalp hematoma. 3. Mild cerebral atrophy. 4. Mild paranasal sinus disease as described. CT cervical spine: 1. No evidence of acute fracture to the cervical spine. 2. C3-C4 grade 1 anterolisthesis. 3. Nonspecific reversal of the expected cervical lordosis. 4. Cervical spondylosis as described. C2-C3 and C4-C5 fusion. 5. Previously biopsied 2.1 cm right thyroid lobe nodule. Electronically Signed   By: Jackey Loge DO   On: 04/17/2020 12:10   CT Cervical Spine Wo Contrast  Result Date:  04/12/2020 CLINICAL DATA:  Facial trauma. Neck trauma. Additional history provided: Multiple falls over the past week hitting head, last fall 3 days ago, headache, new difficulty hearing. EXAM: CT HEAD WITHOUT CONTRAST CT CERVICAL SPINE WITHOUT CONTRAST TECHNIQUE: Multidetector CT imaging of the head and cervical spine was performed following the standard protocol without intravenous contrast. Multiplanar CT image reconstructions of the cervical spine were also generated. COMPARISON:  CT head 08/29/2012. Radiographs of the cervical spine 08/29/2012. FINDINGS: CT HEAD FINDINGS Brain: Mild cerebral atrophy. There is no acute intracranial hemorrhage. No demarcated cortical infarct. No extra-axial fluid collection. No evidence of intracranial mass. No midline shift. Vascular: No hyperdense vessel.  Atherosclerotic calcifications. Skull: Normal. Negative for fracture or focal lesion. Sinuses/Orbits: Visualized orbits show no acute finding. Mild frontal, ethmoid and left maxillary sinus mucosal thickening. Small mucous retention cysts within the right sphenoid and left maxillary sinuses. Other: Small left parietal scalp hematoma (series 4, image 30). CT CERVICAL SPINE FINDINGS Alignment: Mild reversal of the expected cervical lordosis. 3 mm C3-C4 grade 1 anterolisthesis. Skull base and vertebrae: The basion-dental and atlanto-dental intervals are maintained.No evidence of acute fracture to the cervical spine. Soft tissues and spinal canal: No prevertebral fluid or swelling. No visible canal hematoma. Disc levels: Cervical spondylosis with multilevel disc space narrowing, disc bulges, uncovertebral hypertrophy and facet arthrosis. Fusion across the anterior and posterior disc space at C2-C3 and C4-C5. Facet joint ankylosis bilaterally at C2-C3 and on the left at  C4-C5. Upper chest: No consolidation within the imaged lung apices. No visible pneumothorax. Other: 2.1 cm right thyroid lobe nodule. IMPRESSION: CT head: 1. No  evidence of acute intracranial abnormality. 2. Small left parietal scalp hematoma. 3. Mild cerebral atrophy. 4. Mild paranasal sinus disease as described. CT cervical spine: 1. No evidence of acute fracture to the cervical spine. 2. C3-C4 grade 1 anterolisthesis. 3. Nonspecific reversal of the expected cervical lordosis. 4. Cervical spondylosis as described. C2-C3 and C4-C5 fusion. 5. Previously biopsied 2.1 cm right thyroid lobe nodule. Electronically Signed   By: Jackey Loge DO   On: 04/18/2020 12:10   DG Pelvis Portable  Result Date: 04/17/2020 CLINICAL DATA:  Recent falls with pelvic pain, initial encounter EXAM: PORTABLE PELVIS 1-2 VIEWS COMPARISON:  None. FINDINGS: Pelvic ring is intact. Postsurgical changes are noted. Degenerative changes of the hip joints are seen bilaterally. No acute fracture is seen. No soft tissue abnormality is noted. IMPRESSION: No acute abnormality noted. Electronically Signed   By: Alcide Clever M.D.   On: 04/03/2020 11:54   CT CHEST ABDOMEN PELVIS W CONTRAST  Result Date: 03/29/2020 CLINICAL DATA:  Multiple falls over the past week. Initial encounter. EXAM: CT CHEST, ABDOMEN, AND PELVIS WITH CONTRAST TECHNIQUE: Multidetector CT imaging of the chest, abdomen and pelvis was performed following the standard protocol during bolus administration of intravenous contrast. CONTRAST:  OMNIPAQUE IOHEXOL 300 MG/ML  SOLN COMPARISON:  CT chest 09/19/2019.  CT right hip 07/26/2019. 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 No dissection is identified. There is cardiomegaly. Aortic and coronary atherosclerosis are again seen. No pericardial effusion. Mediastinum/Nodes: No lymphadenopathy. The walls of the esophagus are mildly thickened and there is some fluid within the esophagus. 1.5 cm low attenuating lesion the right lobe of the thyroid is unchanged and has been evaluated previously with ultrasound. Not clinically significant; no  follow-up imaging recommended (ref: J Am Coll Radiol. 2015 Feb;12(2): 143-50). Lungs/Pleura: No pleural effusion. There is some dependent atelectasis. No pneumothorax or consolidative process. Subtle 0.8 cm subpleural nodule in the left upper lobe is unchanged on image 67. 0.5 cm subpleural nodule in the right lower lobe on image 85 is also unchanged. Musculoskeletal: The patient has a very mild superior endplate compression fracture of T11 which is new since the prior examination. No bony retropulsion or involvement of the posterior elements is identified. Also seen is a fracture of the posterior arc of T11 medially adjacent to the vertebral body. There is some callus formation about the fracture. No other fracture is identified. CT ABDOMEN PELVIS FINDINGS Hepatobiliary: The liver is low attenuating. No focal lesion. Stones are seen in the gallbladder. No CT evidence of cholecystitis. Biliary tree is negative. Pancreas: Unremarkable. No pancreatic ductal dilatation or surrounding inflammatory changes. Spleen: Normal in size without focal abnormality. Adrenals/Urinary Tract: Adrenal glands are unremarkable. Kidneys are normal, without renal calculi, focal lesion, or hydronephrosis. Bladder is unremarkable. Stomach/Bowel: Stomach is within normal limits. Appendix appears normal. No evidence of bowel wall thickening, distention, or inflammatory changes. Vascular/Lymphatic: Aortic atherosclerosis. No enlarged abdominal or pelvic lymph nodes. Reproductive: Status post prostatectomy. Penile prosthesis in place. Other: Fat containing inguinal hernias, larger on the right. No change. Musculoskeletal: No acute or focal abnormality. IMPRESSION: Mild T11 superior endplate compression fracture without bony retropulsion or involvement of the posterior elements. There is also a fracture of the medially J adjacent right eleventh rib with some callus formation. The fractures are new since the 09/18/2019  chest CT and most  consistent with subacute to late subacute injuries. No other evidence of trauma. Mild thickening of the walls of the esophagus suggestive of inflammatory change. There is some fluid in the esophagus which could be due to poor motility or reflux. 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 Cardiomegaly. Gallstones without evidence of cholecystitis. Fatty infiltration of the liver. Diverticulosis without diverticulitis. Fat containing bilateral inguinal hernias. Status post prostatectomy. Aortic Atherosclerosis (ICD10-I70.0). Calcific coronary artery disease also noted. Electronically Signed   By: Drusilla Kanner M.D.   On: 04-13-2020 14:43   DG Chest Portable 1 View  Result Date: 04-13-20 CLINICAL DATA:  Fall EXAM: PORTABLE CHEST 1 VIEW COMPARISON:  2018 FINDINGS: Cardiomegaly similar to the prior study. Low lung volumes. Probable mild interstitial edema. No significant pleural effusion. No pneumothorax. IMPRESSION: Cardiomegaly and probable mild interstitial edema. Electronically Signed   By: Guadlupe Spanish M.D.   On: April 13, 2020 11:57      Assessment/Plan Principal Problem:   Delirium Active Problems:   NICM (nonischemic cardiomyopathy) (HCC)   Hyperlipidemia   Insomnia   Chronic systolic CHF (congestive heart failure) (HCC)   Uncontrolled type 2 diabetes mellitus with hyperglycemia, without long-term current use of insulin (HCC)   Essential hypertension     #1 acute delirium: Patient may have underlying dementia with acute delirium.  It could also be psych related.  So far no clear-cut medical reason.  He will need evaluation including possible lumbar puncture.  Medications evaluated patient.  Get MRI of the brain.  Admit to [facility] to get neurology consult for further advice.  #2 nonischemic cardiomyopathy: Has systolic dysfunction apparently.  We'll continue home medications  #3 uncontrolled diabetes: Sliding scale insulin we'll continue to monitor  #4 hyperlipidemia:  Continue statin  #5 chronic insomnia: Patient apparently has been tried on multiple medications according to caregiver but none of them helped.  He is still confused and appears to be agitated.  Will defer to neurology for any trials while in the hospital.     DVT prophylaxis: SCD Code Status: Full code Family Communication: Caregiver at bedside and daughter over the phone Disposition Plan: To be determined Consults called: Neurologist Dr. Otelia Limes Admission status: Inpatient:  Severity of Illness: The appropriate patient status for this patient is INPATIENT. Inpatient status is judged to be reasonable and necessary in order to provide the required intensity of service to ensure the patient's safety. The patient's presenting symptoms, physical exam findings, and initial radiographic and laboratory data in the context of their chronic comorbidities is felt to place them at high risk for further clinical deterioration. Furthermore, it is not anticipated that the patient will be medically stable for discharge from the hospital within 2 midnights of admission. The following factors support the patient status of inpatient.   " The patient's presenting symptoms include agitation and confusion. " The worrisome physical exam findings include significant agitation. " The initial radiographic and laboratory data are worrisome because of no obvious findings. " The chronic co-morbidities include PTSD.   * I certify that at the point of admission it is my clinical judgment that the patient will require inpatient hospital care spanning beyond 2 midnights from the point of admission due to high intensity of service, high risk for further deterioration and high frequency of surveillance required.Lonia Blood MD [facility] Pager [phone]  If 7PM-7AM, please contact night-coverage [email or web address] Password [email or web address]  April 13, 2020, 11:20 PM

## 2020-04-11 NOTE — ED Notes (Signed)
Pt cleaned. Linen changes and family at bedside. Pt is still confused and trying to get out of bed

## 2020-04-11 NOTE — ED Provider Notes (Signed)
Patient is transfer from [facility] for disposition.  Patient had significant confusion and behavioral changes with frequent falls.  Review of history with patient's daughter and caregiver indicate that he has had incrementally increasing confusion that waxes and wanes and loss of gait symptoms particularly pronounced over the past week.  They report patient has good recall and lucidity intermittently but at other times is very agitated and prone to injury and fall.  This seems to have developed incrementally since he sustained a subarachnoid hemorrhage earlier this year.  Patient does not have formal diagnosis of dementia per his caretaker. Physical Exam  BP (!) 139/98 (BP Location: Left Arm) Comment: Simultaneous filing. User may not have seen previous data.   Pulse 92 Comment: Simultaneous filing. User may not have seen previous data.   Temp 98 F (36.7 C) (Oral)    Resp 18 Comment: Simultaneous filing. User may not have seen previous data.   Ht 5\' 10"  (1.778 m)    Wt 98.9 kg    SpO2 94% Comment: Simultaneous filing. User may not have seen previous data.   BMI 31.29 kg/m   Physical Exam Constitutional:      Comments: Patient is agitated and appears to be in delirium.  At times he is speaking situationally oriented but other conversation seems tangential and unrelated.  Is persistently moving in the stretcher.  HENT:     Head: Normocephalic and atraumatic.     Mouth/Throat:     Mouth: Mucous membranes are dry.     Pharynx: Oropharynx is clear.  Eyes:     Extraocular Movements: Extraocular movements intact.  Cardiovascular:     Rate and Rhythm: Normal rate and regular rhythm.  Pulmonary:     Comments: Patient appears to have slight increased work of breathing with tachypnea.  Lungs are grossly clear. Abdominal:     General: There is no distension.     Palpations: Abdomen is soft.  Musculoskeletal:        General: No swelling or tenderness. Normal range of motion.  Skin:     General: Skin is warm and dry.  Neurological:     Comments: Confused.  Patient is moving all 4 extremities with symmetric use.  No evidence of focal motor deficits     ED Course/Procedures   Clinical Course as of 2020-05-11 2307  Fri 05-11-2020  1614 Dr. Apr 13, 2020 [BM]  1625 Dr. Hanley Ben [BM]    Clinical Course User Index [BM] Donnald Garre, PA-C    Procedures  MDM  Patient is mental status appears to be waxing and waning.  Have concern for delirium versus dementia with psychotic features.  He does not have formal diagnosis of dementia and at this time, do not see evidence of MRI.  CT head does not show any acute changes.  Patient had subarachnoid hemorrhage earlier in the year. At this time, reviewed case with Dr. Bill Salinas and will transfer to [facility] for further diagnostic neurology evaluation to confirm dementia with agitation and behavioral disturbance versus delirium of other etiology.      [facility], MD 05/11/20 2314

## 2020-04-12 LAB — CBC
HCT: 37.5 % — ABNORMAL LOW (ref 39.0–52.0)
Hemoglobin: 12.1 g/dL — ABNORMAL LOW (ref 13.0–17.0)
MCH: 26.8 pg (ref 26.0–34.0)
MCHC: 32.3 g/dL (ref 30.0–36.0)
MCV: 83 fL (ref 80.0–100.0)
Platelets: 205 10*3/uL (ref 150–400)
RBC: 4.52 MIL/uL (ref 4.22–5.81)
RDW: 15.1 % (ref 11.5–15.5)
WBC: 8.8 10*3/uL (ref 4.0–10.5)
nRBC: 0 % (ref 0.0–0.2)

## 2020-04-12 LAB — CBG MONITORING, ED
Glucose-Capillary: 127 mg/dL — ABNORMAL HIGH (ref 70–99)
Glucose-Capillary: 143 mg/dL — ABNORMAL HIGH (ref 70–99)
Glucose-Capillary: 156 mg/dL — ABNORMAL HIGH (ref 70–99)
Glucose-Capillary: 125 mg/dL — ABNORMAL HIGH (ref 70–99)

## 2020-04-12 LAB — COMPREHENSIVE METABOLIC PANEL
ALT: 18 U/L (ref 0–44)
AST: 22 U/L (ref 15–41)
Albumin: 3.7 g/dL (ref 3.5–5.0)
Alkaline Phosphatase: 86 U/L (ref 38–126)
Anion gap: 11 (ref 5–15)
BUN: 16 mg/dL (ref 8–23)
CO2: 24 mmol/L (ref 22–32)
Calcium: 9.5 mg/dL (ref 8.9–10.3)
Chloride: 106 mmol/L (ref 98–111)
Creatinine, Ser: 1.04 mg/dL (ref 0.61–1.24)
GFR, Estimated: >60 mL/min (ref >60)
Glucose, Bld: 144 mg/dL — ABNORMAL HIGH (ref 70–99)
Potassium: 4.9 mmol/L (ref 3.5–5.1)
Sodium: 141 mmol/L (ref 135–145)
Total Bilirubin: 0.8 mg/dL (ref 0.3–1.2)
Total Protein: 6.9 g/dL (ref 6.5–8.1)

## 2020-04-12 LAB — RAPID URINE DRUG SCREEN, HOSP PERFORMED
Amphetamines: NOT DETECTED
Barbiturates: NOT DETECTED
Benzodiazepines: POSITIVE — AB
Cocaine: NOT DETECTED
Opiates: NOT DETECTED
Tetrahydrocannabinol: NOT DETECTED

## 2020-04-12 LAB — TSH: TSH: 0.14 u[IU]/mL — ABNORMAL LOW (ref 0.350–4.500)

## 2020-04-12 LAB — D-DIMER, QUANTITATIVE: D-Dimer, Quant: 1.39 ug/mL-FEU — ABNORMAL HIGH (ref 0.00–0.50)

## 2020-04-12 LAB — HEMOGLOBIN A1C
Hgb A1c MFr Bld: 8.3 % — ABNORMAL HIGH (ref 4.8–5.6)
Mean Plasma Glucose: 191.51 mg/dL

## 2020-04-12 MED ORDER — HYDROXYZINE HCL 50 MG/ML IM SOLN
50.0000 mg | Freq: Once | INTRAMUSCULAR | Status: AC
  Administered 2020-04-12: 50 mg via INTRAMUSCULAR
  Filled 2020-04-12: qty 1

## 2020-04-12 MED ORDER — TORSEMIDE 20 MG PO TABS
20.0000 mg | ORAL_TABLET | Freq: Two times a day (BID) | ORAL | Status: DC
  Filled 2020-04-12 (×3): qty 1

## 2020-04-12 MED ORDER — ROSUVASTATIN CALCIUM 5 MG PO TABS
5.0000 mg | ORAL_TABLET | Freq: Every day | ORAL | Status: DC
  Administered 2020-04-13: 5 mg via ORAL
  Filled 2020-04-12 (×4): qty 1

## 2020-04-12 MED ORDER — DULOXETINE HCL 30 MG PO CPEP
30.0000 mg | ORAL_CAPSULE | Freq: Every day | ORAL | Status: DC
  Filled 2020-04-12: qty 1

## 2020-04-12 MED ORDER — HALOPERIDOL LACTATE 5 MG/ML IJ SOLN
5.0000 mg | Freq: Four times a day (QID) | INTRAMUSCULAR | Status: DC | PRN
  Administered 2020-04-12: 5 mg via INTRAVENOUS
  Filled 2020-04-12: qty 1

## 2020-04-12 MED ORDER — HALOPERIDOL LACTATE 5 MG/ML IJ SOLN: 2.0000 mg | Freq: Once | INTRAMUSCULAR | Status: DC

## 2020-04-12 MED ORDER — SPIRONOLACTONE 25 MG PO TABS
25.0000 mg | ORAL_TABLET | Freq: Every day | ORAL | Status: DC
Start: 2020-04-12 — End: 2020-04-13
  Filled 2020-04-12 (×2): qty 1

## 2020-04-12 MED ORDER — METOPROLOL SUCCINATE ER 25 MG PO TB24
12.5000 mg | ORAL_TABLET | Freq: Two times a day (BID) | ORAL | Status: DC
  Administered 2020-04-13: 12.5 mg via ORAL
  Filled 2020-04-12: qty 0.5
  Filled 2020-04-12: qty 1
  Filled 2020-04-12 (×3): qty 0.5
  Filled 2020-04-12 (×2): qty 1

## 2020-04-12 MED ORDER — ASPIRIN EC 81 MG PO TBEC
81.0000 mg | DELAYED_RELEASE_TABLET | Freq: Every day | ORAL | Status: DC
  Filled 2020-04-12: qty 1

## 2020-04-12 NOTE — ED Notes (Signed)
Pt. Removed gown. Visitor sitting with pt. And said he won't keep blanket or gown on.

## 2020-04-12 NOTE — ED Notes (Addendum)
Pt. Pulled off the condom cath. Unable to obtain urine sample due to pt being incontinent.

## 2020-04-12 NOTE — ED Notes (Signed)
Pt is still confused pulled out IV and pulled his monitors off . family at bedside

## 2020-04-12 NOTE — ED Notes (Signed)
Pt has urinated in the floor multiples times pt keeps trying to get out of the bed, is hanging legs over the rails and in between them.

## 2020-04-12 NOTE — Progress Notes (Signed)
PROGRESS NOTE    Alan [name]  BZJ:[id number] DOB: 1941-04-12 DOA: 04/09/2020 PCP: [facility]     Brief Narrative:  Alan [name] is a 79 y.o. male with medical history significant of diabetes, PTSD, insomnia, hyperlipidemia, systolic dysfunction CHF, nonischemic cardiomyopathy, hypertension and GERD who was brought in by his caregiver due to worsening mental status.  He was completely delirious agitated in the ED.  He transferred from [facility] where he was seen for behavioral changes and frequent falls at home.  He has had falls in the past with subarachnoid hemorrhage which has resolved.  He recovered but continues to to have now more acute onset of confusion and agitation which has been gradual over the last few days to a week.  He was seen at the [facility] and evaluated and apparently was thought to have psych issues related to his PTSD, also related to his chronic insomnia. Neurology was consulted who recommends transfer to [facility] for continued evaluation and treatment.  New events last 24 hours / Subjective: Patient remains largely unresponsive to verbal stimuli.  Caregiver is at bedside, states that patient has been very agitated, confused, pulling out IV and clothing.  She states that patient has standing history of insomnia, does not sleep, paces around usually.  Assessment & Plan:   Principal Problem:   Delirium Active Problems:   NICM (nonischemic cardiomyopathy) (HCC)   Hyperlipidemia   Insomnia   Chronic systolic CHF (congestive heart failure) (HCC)   Uncontrolled type 2 diabetes mellitus with hyperglycemia, without long-term current use of insulin (HCC)   Essential hypertension   Acute delirium -Has underlying psychiatric history of PTSD -CT head: No evidence of acute intracranial abnormality -Neurology was consulted, MRI brain pending -Consult psychiatry once patient is medically improved and able to participate in exam -Haldol,  ativan PRN   Low TSH  -Check free T4  NICM -Stable.  Continue home meds including aspirin, Crestor, Demadex, Aldactone, Toprol  Diabetes mellitus, with hyperglycemia uncontrolled -Hemoglobin A1c 8.1 -Sliding scale insulin  Frequent falls with injuries -CT head/cervical spine: Small left parietal scalp hematoma. No evidence of acute fracture to the cervical spine -CT C/A/P: Mild T11 compression fracture, fracture of the medially J adjacent right eleventh rib with some callus formation. Most consistent with subacute to late subacute injuries    DVT prophylaxis:  enoxaparin (LOVENOX) injection 40 mg Start: 04/12/20 1000 SCDs Start: 04/01/2020 2325  Code Status: Full Family Communication: Caregiver at bedside  Disposition Plan:  Status is: Inpatient  Remains inpatient appropriate because:Altered mental status   Dispo: The patient is from: Home              Anticipated d/c is to: TBD              Anticipated d/c date is: 3 days              Patient currently is not medically stable to d/c.  Transfer to [facility] pending for neurology evaluation.     Consultants:   Neurology   Procedures:   None   Antimicrobials:  Anti-infectives (From admission, onward)   None        Objective: Vitals:   04/12/20 0700 04/12/20 0730 04/12/20 0800 04/12/20 0900  BP: (!) 127/59 (!) 167/86 (!) 144/90 (!) 144/90  Pulse: 72 89 80 80  Resp: 18 18 19 19   Temp:      TempSrc:      SpO2: 92% 94% 97%  97%  Weight:      Height:       No intake or output data in the 24 hours ending 04/12/20 1034 Filed Weights   May 02, 2020 1106  Weight: 98.9 kg    Examination:  General exam: Appears calm, somnolent  Respiratory system: Clear to auscultation. Respiratory effort normal. No respiratory distress.  Cardiovascular system: S1 & S2 heard, RRR. No murmurs. No pedal edema. Gastrointestinal system: Abdomen is nondistended, soft and nontender. Normal bowel sounds heard. Central nervous  system: Somnolent  Extremities: Symmetric in appearance  Skin: No rashes, lesions or ulcers on exposed skin     Data Reviewed: I have personally reviewed following labs and imaging studies  CBC: Recent Labs  Lab 02-May-2020 1129 05-02-20 2325 04/12/20 0540  WBC 6.4 7.8 8.8  NEUTROABS 3.9  --   --   HGB 11.5* 12.4* 12.1*  HCT 36.0* 38.5* 37.5*  MCV 82.9 82.6 83.0  PLT 198 215 205   Basic Metabolic Panel: Recent Labs  Lab 2020/05/02 1129 05/02/20 2209 05-02-20 2325 04/12/20 0540  NA 136  --   --  141  K 4.6  --   --  4.9  CL 103  --   --  106  CO2 24  --   --  24  GLUCOSE 210*  --   --  144*  BUN 21  --   --  16  CREATININE 1.18  --  1.03 1.04  CALCIUM 9.1  --   --  9.5  MG  --  1.8  --   --   PHOS  --  3.4  --   --    GFR: Estimated Creatinine Clearance: 69.1 mL/min (by C-G formula based on SCr of 1.04 mg/dL). Liver Function Tests: Recent Labs  Lab May 02, 2020 2209 04/12/20 0540  AST 20 22  ALT 20 18  ALKPHOS 90 86  BILITOT 0.8 0.8  PROT 7.2 6.9  ALBUMIN 3.9 3.7   No results for input(s): LIPASE, AMYLASE in the last 168 hours. Recent Labs  Lab 05-02-2020 2208  AMMONIA 21   Coagulation Profile: No results for input(s): INR, PROTIME in the last 168 hours. Cardiac Enzymes: No results for input(s): CKTOTAL, CKMB, CKMBINDEX, TROPONINI in the last 168 hours. BNP (last 3 results) Recent Labs    04/18/19 1510  PROBNP 975*   HbA1C: Recent Labs    May 02, 2020 2325  HGBA1C 8.3*   CBG: Recent Labs  Lab 02-May-2020 2334 04/12/20 1003  GLUCAP 154* 127*   Lipid Profile: No results for input(s): CHOL, HDL, LDLCALC, TRIG, CHOLHDL, LDLDIRECT in the last 72 hours. Thyroid Function Tests: Recent Labs    05-02-20 2230  TSH 0.140*   Anemia Panel: No results for input(s): VITAMINB12, FOLATE, FERRITIN, TIBC, IRON, RETICCTPCT in the last 72 hours. Sepsis Labs: No results for input(s): PROCALCITON, LATICACIDVEN in the last 168 hours.  Recent Results (from the past  240 hour(s))  Resp Panel by RT-PCR (Flu A&B, Covid) Nasopharyngeal Swab     Status: None   Collection Time: 05-02-2020  9:07 PM   Specimen: Nasopharyngeal Swab; Nasopharyngeal(NP) swabs in vial transport medium  Result Value Ref Range Status   SARS Coronavirus 2 by RT PCR NEGATIVE NEGATIVE Final    Comment: (NOTE) SARS-CoV-2 target nucleic acids are NOT DETECTED.  The SARS-CoV-2 RNA is generally detectable in upper respiratory specimens during the acute phase of infection. The lowest concentration of SARS-CoV-2 viral copies this assay can detect is 138 copies/mL. A negative  result does not preclude SARS-Cov-2 infection and should not be used as the sole basis for treatment or other patient management decisions. A negative result may occur with  improper specimen collection/handling, submission of specimen other than nasopharyngeal swab, presence of viral mutation(s) within the areas targeted by this assay, and inadequate number of viral copies(<138 copies/mL). A negative result must be combined with clinical observations, patient history, and epidemiological information. The expected result is Negative.  Fact Sheet for Patients:  BloggerCourse.com  Fact Sheet for Healthcare Providers:  SeriousBroker.it  This test is no t yet approved or cleared by the [place] FDA and  has been authorized for detection and/or diagnosis of SARS-CoV-2 by FDA under an Emergency Use Authorization (EUA). This EUA will remain  in effect (meaning this test can be used) for the duration of the COVID-19 declaration under Section 564(b)(1) of the Act, 21 U.S.C.section 360bbb-3(b)(1), unless the authorization is terminated  or revoked sooner.       Influenza A by PCR NEGATIVE NEGATIVE Final   Influenza B by PCR NEGATIVE NEGATIVE Final    Comment: (NOTE) The Xpert Xpress SARS-CoV-2/FLU/RSV plus assay is intended as an aid in the diagnosis of influenza  from Nasopharyngeal swab specimens and should not be used as a sole basis for treatment. Nasal washings and aspirates are unacceptable for Xpert Xpress SARS-CoV-2/FLU/RSV testing.  Fact Sheet for Patients: BloggerCourse.com  Fact Sheet for Healthcare Providers: SeriousBroker.it  This test is not yet approved or cleared by the [place] FDA and has been authorized for detection and/or diagnosis of SARS-CoV-2 by FDA under an Emergency Use Authorization (EUA). This EUA will remain in effect (meaning this test can be used) for the duration of the COVID-19 declaration under Section 564(b)(1) of the Act, 21 U.S.C. section 360bbb-3(b)(1), unless the authorization is terminated or revoked.  Performed at [facility], [address] [place], [place] [address]       Radiology Studies: CT Head Wo Contrast  Result Date: 16-Apr-2020 CLINICAL DATA:  Facial trauma. Neck trauma. Additional history provided: Multiple falls over the past week hitting head, last fall 3 days ago, headache, new difficulty hearing. EXAM: CT HEAD WITHOUT CONTRAST CT CERVICAL SPINE WITHOUT CONTRAST TECHNIQUE: Multidetector CT imaging of the head and cervical spine was performed following the standard protocol without intravenous contrast. Multiplanar CT image reconstructions of the cervical spine were also generated. COMPARISON:  CT head 08/29/2012. Radiographs of the cervical spine 08/29/2012. FINDINGS: CT HEAD FINDINGS Brain: Mild cerebral atrophy. There is no acute intracranial hemorrhage. No demarcated cortical infarct. No extra-axial fluid collection. No evidence of intracranial mass. No midline shift. Vascular: No hyperdense vessel.  Atherosclerotic calcifications. Skull: Normal. Negative for fracture or focal lesion. Sinuses/Orbits: Visualized orbits show no acute finding. Mild frontal, ethmoid and left maxillary sinus mucosal thickening. Small  mucous retention cysts within the right sphenoid and left maxillary sinuses. Other: Small left parietal scalp hematoma (series 4, image 30). CT CERVICAL SPINE FINDINGS Alignment: Mild reversal of the expected cervical lordosis. 3 mm C3-C4 grade 1 anterolisthesis. Skull base and vertebrae: The basion-dental and atlanto-dental intervals are maintained.No evidence of acute fracture to the cervical spine. Soft tissues and spinal canal: No prevertebral fluid or swelling. No visible canal hematoma. Disc levels: Cervical spondylosis with multilevel disc space narrowing, disc bulges, uncovertebral hypertrophy and facet arthrosis. Fusion across the anterior and posterior disc space at C2-C3 and C4-C5. Facet joint ankylosis bilaterally at C2-C3 and on the left at C4-C5. Upper chest: No  consolidation within the imaged lung apices. No visible pneumothorax. Other: 2.1 cm right thyroid lobe nodule. IMPRESSION: CT head: 1. No evidence of acute intracranial abnormality. 2. Small left parietal scalp hematoma. 3. Mild cerebral atrophy. 4. Mild paranasal sinus disease as described. CT cervical spine: 1. No evidence of acute fracture to the cervical spine. 2. C3-C4 grade 1 anterolisthesis. 3. Nonspecific reversal of the expected cervical lordosis. 4. Cervical spondylosis as described. C2-C3 and C4-C5 fusion. 5. Previously biopsied 2.1 cm right thyroid lobe nodule. Electronically Signed   By: Jackey Loge DO   On: 03/31/2020 12:10   CT Cervical Spine Wo Contrast  Result Date: 04/01/2020 CLINICAL DATA:  Facial trauma. Neck trauma. Additional history provided: Multiple falls over the past week hitting head, last fall 3 days ago, headache, new difficulty hearing. EXAM: CT HEAD WITHOUT CONTRAST CT CERVICAL SPINE WITHOUT CONTRAST TECHNIQUE: Multidetector CT imaging of the head and cervical spine was performed following the standard protocol without intravenous contrast. Multiplanar CT image reconstructions of the cervical spine were  also generated. COMPARISON:  CT head 08/29/2012. Radiographs of the cervical spine 08/29/2012. FINDINGS: CT HEAD FINDINGS Brain: Mild cerebral atrophy. There is no acute intracranial hemorrhage. No demarcated cortical infarct. No extra-axial fluid collection. No evidence of intracranial mass. No midline shift. Vascular: No hyperdense vessel.  Atherosclerotic calcifications. Skull: Normal. Negative for fracture or focal lesion. Sinuses/Orbits: Visualized orbits show no acute finding. Mild frontal, ethmoid and left maxillary sinus mucosal thickening. Small mucous retention cysts within the right sphenoid and left maxillary sinuses. Other: Small left parietal scalp hematoma (series 4, image 30). CT CERVICAL SPINE FINDINGS Alignment: Mild reversal of the expected cervical lordosis. 3 mm C3-C4 grade 1 anterolisthesis. Skull base and vertebrae: The basion-dental and atlanto-dental intervals are maintained.No evidence of acute fracture to the cervical spine. Soft tissues and spinal canal: No prevertebral fluid or swelling. No visible canal hematoma. Disc levels: Cervical spondylosis with multilevel disc space narrowing, disc bulges, uncovertebral hypertrophy and facet arthrosis. Fusion across the anterior and posterior disc space at C2-C3 and C4-C5. Facet joint ankylosis bilaterally at C2-C3 and on the left at C4-C5. Upper chest: No consolidation within the imaged lung apices. No visible pneumothorax. Other: 2.1 cm right thyroid lobe nodule. IMPRESSION: CT head: 1. No evidence of acute intracranial abnormality. 2. Small left parietal scalp hematoma. 3. Mild cerebral atrophy. 4. Mild paranasal sinus disease as described. CT cervical spine: 1. No evidence of acute fracture to the cervical spine. 2. C3-C4 grade 1 anterolisthesis. 3. Nonspecific reversal of the expected cervical lordosis. 4. Cervical spondylosis as described. C2-C3 and C4-C5 fusion. 5. Previously biopsied 2.1 cm right thyroid lobe nodule. Electronically Signed    By: Jackey Loge DO   On: 04/18/2020 12:10   DG Pelvis Portable  Result Date: 03/31/2020 CLINICAL DATA:  Recent falls with pelvic pain, initial encounter EXAM: PORTABLE PELVIS 1-2 VIEWS COMPARISON:  None. FINDINGS: Pelvic ring is intact. Postsurgical changes are noted. Degenerative changes of the hip joints are seen bilaterally. No acute fracture is seen. No soft tissue abnormality is noted. IMPRESSION: No acute abnormality noted. Electronically Signed   By: Alcide Clever M.D.   On: 03/30/2020 11:54   CT CHEST ABDOMEN PELVIS W CONTRAST  Result Date: 04/17/2020 CLINICAL DATA:  Multiple falls over the past week. Initial encounter. EXAM: CT CHEST, ABDOMEN, AND PELVIS WITH CONTRAST TECHNIQUE: Multidetector CT imaging of the chest, abdomen and pelvis was performed following the standard protocol during bolus administration of intravenous contrast. CONTRAST:  OMNIPAQUE IOHEXOL 300 MG/ML  SOLN COMPARISON:  CT chest 09/19/2019.  CT right hip 07/26/2019. 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 No dissection is identified. There is cardiomegaly. Aortic and coronary atherosclerosis are again seen. No pericardial effusion. Mediastinum/Nodes: No lymphadenopathy. The walls of the esophagus are mildly thickened and there is some fluid within the esophagus. 1.5 cm low attenuating lesion the right lobe of the thyroid is unchanged and has been evaluated previously with ultrasound. Not clinically significant; no follow-up imaging recommended (ref: J Am Coll Radiol. 2015 Feb;12(2): 143-50). Lungs/Pleura: No pleural effusion. There is some dependent atelectasis. No pneumothorax or consolidative process. Subtle 0.8 cm subpleural nodule in the left upper lobe is unchanged on image 67. 0.5 cm subpleural nodule in the right lower lobe on image 85 is also unchanged. Musculoskeletal: The patient has a very mild superior endplate compression fracture of T11 which is new since  the prior examination. No bony retropulsion or involvement of the posterior elements is identified. Also seen is a fracture of the posterior arc of T11 medially adjacent to the vertebral body. There is some callus formation about the fracture. No other fracture is identified. CT ABDOMEN PELVIS FINDINGS Hepatobiliary: The liver is low attenuating. No focal lesion. Stones are seen in the gallbladder. No CT evidence of cholecystitis. Biliary tree is negative. Pancreas: Unremarkable. No pancreatic ductal dilatation or surrounding inflammatory changes. Spleen: Normal in size without focal abnormality. Adrenals/Urinary Tract: Adrenal glands are unremarkable. Kidneys are normal, without renal calculi, focal lesion, or hydronephrosis. Bladder is unremarkable. Stomach/Bowel: Stomach is within normal limits. Appendix appears normal. No evidence of bowel wall thickening, distention, or inflammatory changes. Vascular/Lymphatic: Aortic atherosclerosis. No enlarged abdominal or pelvic lymph nodes. Reproductive: Status post prostatectomy. Penile prosthesis in place. Other: Fat containing inguinal hernias, larger on the right. No change. Musculoskeletal: No acute or focal abnormality. IMPRESSION: Mild T11 superior endplate compression fracture without bony retropulsion or involvement of the posterior elements. There is also a fracture of the medially J adjacent right eleventh rib with some callus formation. The fractures are new since the 09/18/2019 chest CT and most consistent with subacute to late subacute injuries. No other evidence of trauma. Mild thickening of the walls of the esophagus suggestive of inflammatory change. There is some fluid in the esophagus which could be due to poor motility or reflux. 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 Cardiomegaly. Gallstones without evidence of cholecystitis. Fatty infiltration of the liver. Diverticulosis without diverticulitis. Fat containing bilateral inguinal hernias.  Status post prostatectomy. Aortic Atherosclerosis (ICD10-I70.0). Calcific coronary artery disease also noted. Electronically Signed   By: Drusilla Kanner M.D.   On: 04/06/2020 14:43   DG Chest Portable 1 View  Result Date: 04/22/2020 CLINICAL DATA:  Fall EXAM: PORTABLE CHEST 1 VIEW COMPARISON:  2018 FINDINGS: Cardiomegaly similar to the prior study. Low lung volumes. Probable mild interstitial edema. No significant pleural effusion. No pneumothorax. IMPRESSION: Cardiomegaly and probable mild interstitial edema. Electronically Signed   By: Guadlupe Spanish M.D.   On: 04/10/2020 11:57      Scheduled Meds: . enoxaparin (LOVENOX) injection  40 mg Subcutaneous Q24H  . insulin aspart  0-5 Units Subcutaneous QHS  . insulin aspart  0-9 Units Subcutaneous TID WC   Continuous Infusions: . lactated ringers 125 mL/hr at 03/30/2020 2348     LOS: 1 day      Time spent: 35 minutes   Noralee Stain, DO [facility] 04/12/2020, 10:34 AM  Available via Epic secure chat 7am-7pm After these hours, please refer to coverage provider listed on [email or web address]

## 2020-04-12 NOTE — ED Notes (Addendum)
Pts. Sheets changed. Placed condom cath. On pt

## 2020-04-12 NOTE — ED Notes (Signed)
Pt trying to climb out of bed, pulling monitors off. Family at bedside

## 2020-04-12 NOTE — ED Notes (Signed)
Breakfast tray placed by bedside. Pt asleep.

## 2020-04-13 DIAGNOSIS — R410 Disorientation, unspecified: Secondary | ICD-10-CM

## 2020-04-13 LAB — BASIC METABOLIC PANEL
Anion gap: 13 (ref 5–15)
BUN: 21 mg/dL (ref 8–23)
CO2: 19 mmol/L — ABNORMAL LOW (ref 22–32)
Calcium: 9.2 mg/dL (ref 8.9–10.3)
Chloride: 108 mmol/L (ref 98–111)
Creatinine, Ser: 1.15 mg/dL (ref 0.61–1.24)
GFR, Estimated: >60 mL/min (ref >60)
Glucose, Bld: 137 mg/dL — ABNORMAL HIGH (ref 70–99)
Potassium: 4.7 mmol/L (ref 3.5–5.1)
Sodium: 140 mmol/L (ref 135–145)

## 2020-04-13 LAB — CBG MONITORING, ED
Glucose-Capillary: 144 mg/dL — ABNORMAL HIGH (ref 70–99)
Glucose-Capillary: 149 mg/dL — ABNORMAL HIGH (ref 70–99)

## 2020-04-13 LAB — GLUCOSE, CAPILLARY: Glucose-Capillary: 162 mg/dL — ABNORMAL HIGH (ref 70–99)

## 2020-04-13 MED ORDER — ALBUTEROL SULFATE (2.5 MG/3ML) 0.083% IN NEBU
2.5000 mg | INHALATION_SOLUTION | Freq: Once | RESPIRATORY_TRACT | Status: AC
  Administered 2020-04-13: 2.5 mg via RESPIRATORY_TRACT
  Filled 2020-04-13: qty 3

## 2020-04-13 MED ORDER — ENSURE ENLIVE PO LIQD: 237.0000 mL | Freq: Two times a day (BID) | ORAL | Status: DC

## 2020-04-13 MED ORDER — LORAZEPAM 2 MG/ML IJ SOLN
1.0000 mg | INTRAMUSCULAR | Status: DC | PRN
  Administered 2020-04-13 – 2020-04-14 (×5): 1 mg via INTRAMUSCULAR
  Filled 2020-04-13 (×4): qty 1

## 2020-04-13 MED ORDER — SODIUM CHLORIDE 0.9 % IV SOLN: INTRAVENOUS | Status: DC

## 2020-04-13 MED ORDER — SODIUM CHLORIDE 0.9 % IV SOLN: Freq: Once | INTRAVENOUS | Status: AC

## 2020-04-13 MED ORDER — HALOPERIDOL LACTATE 5 MG/ML IJ SOLN
5.0000 mg | Freq: Four times a day (QID) | INTRAMUSCULAR | Status: DC | PRN
  Administered 2020-04-13: 5 mg via INTRAMUSCULAR
  Filled 2020-04-13: qty 1

## 2020-04-13 NOTE — ED Notes (Signed)
Attempted to call report to floor x2. 

## 2020-04-13 NOTE — Consult Note (Signed)
NEURO HOSPITALIST CONSULT NOTE   Requestig physician: Dr. Alvino Chapel  Reason for Consult: AMS  History obtained from:  Companion and Chart     HPI:                                                                                                                                          Alan [name] is an 79 y.o. male with a PMHx of atrial fibrillation, CHF, AAA, DM and severe claustrophobia who presented to the [facility] ED on Friday morning after he had had multiple falls over the past week. He was complaining of headache and difficulty hearing. During his ED stay, he became angry and belligerent, making threats such as that he was going to burn down the facility. He was noted to be more disoriented and confused during his ED stay. EDP contacted the patient's Neurologist, who recommended a medical workup and geribehavioral evaluation as well as admission for agitation and depression, noting that the patient was not competen to refuse care. It was also noted by his outpatient Neurologist that "He has not been sleeping well. He wanders through the house at night. He has a 24 hour caretaker but he is getting to be too much for her. He doesn't recognize his daughter half the time and thinks she is his sister. He states he wishes he would die. He is not aggressive or violent. He has not tried to commit suicide. He has a psychiatrist at the [place] but will not go to her."  His medical workup showed no acute metabolic cause for his confusion.  CT scans show subacute fractures but no ICH.  There was no UTI or evidence of infection on work up. Overall presentation was not consistent with a meningitis.  Neurohospitalist service was called to evaluate. On bedside interview with the patient's caretaker, she endorses development over the past year or so, the above symptoms: thrashing in bed while sleeping as though acting out his dreams, waxing and waning cognition with "good days and bad days", a  shuffling Parkinsonian gait that comes and goes, and formed visual hallucinations of people with an auditory component. His memory overall has gradually declined over the past few years.   Past Medical History:  Diagnosis Date   A-fib [facility])    AAA (abdominal aortic aneurysm) (HCC) 2018   Arthritis    CHF (congestive heart failure) (HCC)    Chronic systolic (congestive) heart failure (HCC)    Chronic systolic CHF (congestive heart failure) (HCC)    Claustrophobia    EXTREME    Diabetes mellitus [facility]   Diverticulosis    ED (erectile dysfunction)    GAD (generalized anxiety disorder)    GERD (gastroesophageal reflux disease)    Glaucoma of right eye  History of diverticulitis    History of nuclear stress test    MV 1/18: EF 33, no ischemia; high risk   History of prostate cancer    Lung nodules    Malfunction of artificial urethral sphincter (HCC)    Malfunction of penile prosthesis (HCC)    NICM (nonischemic cardiomyopathy) (HCC) 02/06/2015   LHC 11/14: EF 35, no significant CAD // Echo 7/16: Diff HK worse in inf base, EF 40%, Gr 1 DD, mild AI, mild MR, mod LAE // Echo 5/18: Moderate concentric LVH, EF 35-40, inferolateral and anterolateral AK, normal wall motion, moderate AI, moderate MR, moderate LAE, mild PI   Nocturia    Presbyesophagus 2018   Snoring    SUI (stress urinary incontinence), male    Thoracic aortic aneurysm without rupture (HCC) 02/06/2015   Chest CTA 1/18:  5.1 cm ascending aortic aneurysm, mild cardiomegaly, mild coronary artery calcification, upper lobe small airway disease, cholelithiasis, 16 mm right thyroid nodule   Thyroid nodule     Past Surgical History:  Procedure Laterality Date   ANKLE SURGERY Bilateral    BALLOON DILATION N/A 12/04/2019   Procedure: BALLOON DILATION;  Surgeon: Sherrilyn Rist, MD;  Location: [facility] ENDOSCOPY;  Service: Gastroenterology;  Laterality: N/A;   BIOPSY THYROID     COLONOSCOPY  2010    ESOPHAGOGASTRODUODENOSCOPY N/A 12/04/2019   Procedure: ESOPHAGOGASTRODUODENOSCOPY (EGD);  Surgeon: Sherrilyn Rist, MD;  Location: [facility];  Service: Gastroenterology;  Laterality: N/A;   I & D/ ORIF RIGHT INDEX FINGER  01-19-2005   INSERTION INFLATABLE PENILE PROSTHESIS  06-22-1999   MENTOR ALPHA I   IR GENERIC HISTORICAL  06/17/2016   IR RADIOLOGIST EVAL & MGMT 06/17/2016 Jolaine Click, MD [facility] INTERV RAD   LEFT HEART CATHETERIZATION WITH CORONARY ANGIOGRAM N/A 03/15/2013   Procedure: LEFT HEART CATHETERIZATION WITH CORONARY ANGIOGRAM;  Surgeon: [name], MD;  Location: [facility] CATH LAB;  Service: Cardiovascular;  Laterality: N/A;   PENILE PROSTHESIS IMPLANT  09/24/2011   Procedure: PENILE PROTHESIS INFLATABLE;  Surgeon: [name], MD;  Location: [facility];  Service: Urology;  Laterality: N/A;  REPLACEMENT OF INFLATABLE PENILE PROTHESIS (COLOPLAST)     PLACEMENT ARTIFICIAL URINARY SPHINTER  01-20-2001   [id number]   REMOVAL AND REPLACEMENT GU SPHINTER CUFF  05-15-2004   STRESS URINARY INCONTINENCE   RIGHT HEART CATH N/A 06/28/2019   Procedure: RIGHT HEART CATH;  Surgeon: Laurey Morale, MD;  Location: [facility] INVASIVE CV LAB;  Service: Cardiovascular;  Laterality: N/A;    Family History  Problem Relation Age of Onset   Heart Problems Mother    Heart attack Father    Healthy Sister        NO HEART ISSUES   Healthy Sister        NO HEART ISSUES              Social History:  reports that he has never smoked. He has never used smokeless tobacco. He reports that he does not drink alcohol and does not use drugs.  Allergies  Allergen Reactions   Ezetimibe Other (See Comments)    Confusion   Statins Other (See Comments)    Myalgias   Levaquin [Levofloxacin]     Can cause problem with aneurism, use PCN or Keflex    Lacosamide Rash   Other     Pt is extremely claustrophobic, will have a severe panic attack if he is left alone in a small space  including  an empty hospital room. Especially after waking up from surgery.    Pravastatin Palpitations   Ropinirole     Nightmares     MEDICATIONS:                                                                                                                     Prior to Admission: (Not in a hospital admission)  Scheduled:  aspirin EC  81 mg Oral Daily   DULoxetine  30 mg Oral Daily   enoxaparin (LOVENOX) injection  40 mg Subcutaneous Q24H   insulin aspart  0-5 Units Subcutaneous QHS   insulin aspart  0-9 Units Subcutaneous TID WC   metoprolol succinate  12.5 mg Oral BID   rosuvastatin  5 mg Oral QHS   spironolactone  25 mg Oral Daily   torsemide  20 mg Oral BID   Continuous:  lactated ringers Stopped (04/12/20 2039)     ROS:                                                                                                                                       Unable to obtain due to delirium.    Blood pressure 135/89, pulse (!) 101, temperature 98 F (36.7 C), temperature source Oral, resp. rate (!) 22, height 5\' 10"  (1.778 m), weight 98.9 kg, SpO2 93 %.   General Examination:                                                                                                       Physical Exam  HEENT-  [place]/AT    Lungs- Some wheezing is noted.  Extremities- No edema  Neurological Examination Mental Status: Agitated delirium with periods of calm interspersed with mild limb thrashing, pulling off of covers and loud, incoherent agitated vocalizations. Does not follow any commands or answer any questions. Eyes are closed and will resist opening.  Cranial Nerves: II: PERRL. Does not allow examiner to lift eyelids long enough to test for  blink to threat.   III,IV, VI: Eyes are conjugate without forced gaze deviation.  V,VII: Reacts equally to eyelid stimulation bilaterally. Face symmetric.  VIII: Unable to assess IX,X: Phonation intact XI: Head is midline XII: Unable  to assess Motor: Thrashes upper and lower extremities spontaneously with equal strength. Coordination appears normal while patient grabs and throws off blanket. Moves BLE spontaneously and equally when he adjusts his position in bed. Withdraws BLE briskly to noxious plantar stimulation Sensory: As above Deep Tendon Reflexes: Normoactive x 4.  Plantars: Equivocal bilaterally  Cerebellar/Gait: Unable to assess    Lab Results: Basic Metabolic Panel: Recent Labs  Lab 04/13/2020 1129 04/10/2020 2209 04/24/2020 2325 04/12/20 0540  NA 136  --   --  141  K 4.6  --   --  4.9  CL 103  --   --  106  CO2 24  --   --  24  GLUCOSE 210*  --   --  144*  BUN 21  --   --  16  CREATININE 1.18  --  1.03 1.04  CALCIUM 9.1  --   --  9.5  MG  --  1.8  --   --   PHOS  --  3.4  --   --     CBC: Recent Labs  Lab 04/07/2020 1129 04/21/2020 2325 04/12/20 0540  WBC 6.4 7.8 8.8  NEUTROABS 3.9  --   --   HGB 11.5* 12.4* 12.1*  HCT 36.0* 38.5* 37.5*  MCV 82.9 82.6 83.0  PLT 198 215 205    Cardiac Enzymes: No results for input(s): CKTOTAL, CKMB, CKMBINDEX, TROPONINI in the last 168 hours.  Lipid Panel: No results for input(s): CHOL, TRIG, HDL, CHOLHDL, VLDL, LDLCALC in the last 168 hours.  Imaging: CT Head Wo Contrast  Result Date: 04/17/2020 CLINICAL DATA:  Facial trauma. Neck trauma. Additional history provided: Multiple falls over the past week hitting head, last fall 3 days ago, headache, new difficulty hearing. EXAM: CT HEAD WITHOUT CONTRAST CT CERVICAL SPINE WITHOUT CONTRAST TECHNIQUE: Multidetector CT imaging of the head and cervical spine was performed following the standard protocol without intravenous contrast. Multiplanar CT image reconstructions of the cervical spine were also generated. COMPARISON:  CT head 08/29/2012. Radiographs of the cervical spine 08/29/2012. FINDINGS: CT HEAD FINDINGS Brain: Mild cerebral atrophy. There is no acute intracranial hemorrhage. No demarcated cortical infarct.  No extra-axial fluid collection. No evidence of intracranial mass. No midline shift. Vascular: No hyperdense vessel.  Atherosclerotic calcifications. Skull: Normal. Negative for fracture or focal lesion. Sinuses/Orbits: Visualized orbits show no acute finding. Mild frontal, ethmoid and left maxillary sinus mucosal thickening. Small mucous retention cysts within the right sphenoid and left maxillary sinuses. Other: Small left parietal scalp hematoma (series 4, image 30). CT CERVICAL SPINE FINDINGS Alignment: Mild reversal of the expected cervical lordosis. 3 mm C3-C4 grade 1 anterolisthesis. Skull base and vertebrae: The basion-dental and atlanto-dental intervals are maintained.No evidence of acute fracture to the cervical spine. Soft tissues and spinal canal: No prevertebral fluid or swelling. No visible canal hematoma. Disc levels: Cervical spondylosis with multilevel disc space narrowing, disc bulges, uncovertebral hypertrophy and facet arthrosis. Fusion across the anterior and posterior disc space at C2-C3 and C4-C5. Facet joint ankylosis bilaterally at C2-C3 and on the left at C4-C5. Upper chest: No consolidation within the imaged lung apices. No visible pneumothorax. Other: 2.1 cm right thyroid lobe nodule. IMPRESSION: CT head: 1. No evidence of acute intracranial abnormality. 2. Small left parietal  scalp hematoma. 3. Mild cerebral atrophy. 4. Mild paranasal sinus disease as described. CT cervical spine: 1. No evidence of acute fracture to the cervical spine. 2. C3-C4 grade 1 anterolisthesis. 3. Nonspecific reversal of the expected cervical lordosis. 4. Cervical spondylosis as described. C2-C3 and C4-C5 fusion. 5. Previously biopsied 2.1 cm right thyroid lobe nodule. Electronically Signed   By: Jackey Loge DO   On: 03/27/2020 12:10   CT Cervical Spine Wo Contrast  Result Date: 04/06/2020 CLINICAL DATA:  Facial trauma. Neck trauma. Additional history provided: Multiple falls over the past week hitting  head, last fall 3 days ago, headache, new difficulty hearing. EXAM: CT HEAD WITHOUT CONTRAST CT CERVICAL SPINE WITHOUT CONTRAST TECHNIQUE: Multidetector CT imaging of the head and cervical spine was performed following the standard protocol without intravenous contrast. Multiplanar CT image reconstructions of the cervical spine were also generated. COMPARISON:  CT head 08/29/2012. Radiographs of the cervical spine 08/29/2012. FINDINGS: CT HEAD FINDINGS Brain: Mild cerebral atrophy. There is no acute intracranial hemorrhage. No demarcated cortical infarct. No extra-axial fluid collection. No evidence of intracranial mass. No midline shift. Vascular: No hyperdense vessel.  Atherosclerotic calcifications. Skull: Normal. Negative for fracture or focal lesion. Sinuses/Orbits: Visualized orbits show no acute finding. Mild frontal, ethmoid and left maxillary sinus mucosal thickening. Small mucous retention cysts within the right sphenoid and left maxillary sinuses. Other: Small left parietal scalp hematoma (series 4, image 30). CT CERVICAL SPINE FINDINGS Alignment: Mild reversal of the expected cervical lordosis. 3 mm C3-C4 grade 1 anterolisthesis. Skull base and vertebrae: The basion-dental and atlanto-dental intervals are maintained.No evidence of acute fracture to the cervical spine. Soft tissues and spinal canal: No prevertebral fluid or swelling. No visible canal hematoma. Disc levels: Cervical spondylosis with multilevel disc space narrowing, disc bulges, uncovertebral hypertrophy and facet arthrosis. Fusion across the anterior and posterior disc space at C2-C3 and C4-C5. Facet joint ankylosis bilaterally at C2-C3 and on the left at C4-C5. Upper chest: No consolidation within the imaged lung apices. No visible pneumothorax. Other: 2.1 cm right thyroid lobe nodule. IMPRESSION: CT head: 1. No evidence of acute intracranial abnormality. 2. Small left parietal scalp hematoma. 3. Mild cerebral atrophy. 4. Mild paranasal  sinus disease as described. CT cervical spine: 1. No evidence of acute fracture to the cervical spine. 2. C3-C4 grade 1 anterolisthesis. 3. Nonspecific reversal of the expected cervical lordosis. 4. Cervical spondylosis as described. C2-C3 and C4-C5 fusion. 5. Previously biopsied 2.1 cm right thyroid lobe nodule. Electronically Signed   By: Jackey Loge DO   On: 04/09/2020 12:10   DG Pelvis Portable  Result Date: 03/27/2020 CLINICAL DATA:  Recent falls with pelvic pain, initial encounter EXAM: PORTABLE PELVIS 1-2 VIEWS COMPARISON:  None. FINDINGS: Pelvic ring is intact. Postsurgical changes are noted. Degenerative changes of the hip joints are seen bilaterally. No acute fracture is seen. No soft tissue abnormality is noted. IMPRESSION: No acute abnormality noted. Electronically Signed   By: Alcide Clever M.D.   On: 03/29/2020 11:54   CT CHEST ABDOMEN PELVIS W CONTRAST  Result Date: 04/04/2020 CLINICAL DATA:  Multiple falls over the past week. Initial encounter. EXAM: CT CHEST, ABDOMEN, AND PELVIS WITH CONTRAST TECHNIQUE: Multidetector CT imaging of the chest, abdomen and pelvis was performed following the standard protocol during bolus administration of intravenous contrast. CONTRAST:  OMNIPAQUE IOHEXOL 300 MG/ML  SOLN COMPARISON:  CT chest 09/19/2019.  CT right hip 07/26/2019. 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 No  dissection is identified. There is cardiomegaly. Aortic and coronary atherosclerosis are again seen. No pericardial effusion. Mediastinum/Nodes: No lymphadenopathy. The walls of the esophagus are mildly thickened and there is some fluid within the esophagus. 1.5 cm low attenuating lesion the right lobe of the thyroid is unchanged and has been evaluated previously with ultrasound. Not clinically significant; no follow-up imaging recommended (ref: J Am Coll Radiol. 2015 Feb;12(2): 143-50). Lungs/Pleura: No pleural effusion. There is some  dependent atelectasis. No pneumothorax or consolidative process. Subtle 0.8 cm subpleural nodule in the left upper lobe is unchanged on image 67. 0.5 cm subpleural nodule in the right lower lobe on image 85 is also unchanged. Musculoskeletal: The patient has a very mild superior endplate compression fracture of T11 which is new since the prior examination. No bony retropulsion or involvement of the posterior elements is identified. Also seen is a fracture of the posterior arc of T11 medially adjacent to the vertebral body. There is some callus formation about the fracture. No other fracture is identified. CT ABDOMEN PELVIS FINDINGS Hepatobiliary: The liver is low attenuating. No focal lesion. Stones are seen in the gallbladder. No CT evidence of cholecystitis. Biliary tree is negative. Pancreas: Unremarkable. No pancreatic ductal dilatation or surrounding inflammatory changes. Spleen: Normal in size without focal abnormality. Adrenals/Urinary Tract: Adrenal glands are unremarkable. Kidneys are normal, without renal calculi, focal lesion, or hydronephrosis. Bladder is unremarkable. Stomach/Bowel: Stomach is within normal limits. Appendix appears normal. No evidence of bowel wall thickening, distention, or inflammatory changes. Vascular/Lymphatic: Aortic atherosclerosis. No enlarged abdominal or pelvic lymph nodes. Reproductive: Status post prostatectomy. Penile prosthesis in place. Other: Fat containing inguinal hernias, larger on the right. No change. Musculoskeletal: No acute or focal abnormality. IMPRESSION: Mild T11 superior endplate compression fracture without bony retropulsion or involvement of the posterior elements. There is also a fracture of the medially J adjacent right eleventh rib with some callus formation. The fractures are new since the 09/18/2019 chest CT and most consistent with subacute to late subacute injuries. No other evidence of trauma. Mild thickening of the walls of the esophagus suggestive  of inflammatory change. There is some fluid in the esophagus which could be due to poor motility or reflux. 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 Cardiomegaly. Gallstones without evidence of cholecystitis. Fatty infiltration of the liver. Diverticulosis without diverticulitis. Fat containing bilateral inguinal hernias. Status post prostatectomy. Aortic Atherosclerosis (ICD10-I70.0). Calcific coronary artery disease also noted. Electronically Signed   By: Drusilla Kanner M.D.   On: 03/31/2020 14:43   DG Chest Portable 1 View  Result Date: 04/23/2020 CLINICAL DATA:  Fall EXAM: PORTABLE CHEST 1 VIEW COMPARISON:  2018 FINDINGS: Cardiomegaly similar to the prior study. Low lung volumes. Probable mild interstitial edema. No significant pleural effusion. No pneumothorax. IMPRESSION: Cardiomegaly and probable mild interstitial edema. Electronically Signed   By: Guadlupe Spanish M.D.   On: 04/06/2020 11:57    Assessment: 79 year old male with progressive cognitive decline, presenting with acute delirium 1. Exam is limited by patient somnolence mixed with agitation. He recently received Haldol 5 mg IV at 2255, about 4 hours prior to neurological exam. No lateralized motor deficit. Pupils are equal. No forced gaze deviation. No facial droop.  2. CT head: No evidence of acute intracranial abnormality. Small left parietal scalp hematoma. Mild cerebral atrophy. 3. Overall history is suggestive of Lewy body dementia (see HPI). Given the essentially negative work up so far, his acute worsening is most likely due to a spontaneous  cognitive fluctuation in the setting of LBD.   4. Overall clinical picture not consistent with a meningitis.  5. AST, ALT and ammonia are normal.  6. TSH is low at 0.14.   Recommendations: 1. Discontinue Haldol. Avoid neuroleptics in patients with possible or confirmed Lewy body dementia, as these medications can result in significant motor and cognitive worsening.  2. Consider a  trial of Aricept when he stabilizes 3. Per caretaker, MRI cannot be obtained due to agitation. She states that he may need to be sedated for MRI. Overall, it is felt that the risk of MRI under anesthesia most likely outweighs benefit of the diagnostic information obtained.  4. Gentle IV hydration.  5. Outpatient detailed neurocognitive evaluation for possible Lewy body dementia.  6. Obtain vitamin B12 level, thiamine level 7. Obtain T3 and free T4 level.   Electronically signed: Dr. Caryl Pina 04/13/2020, 3:42 AM

## 2020-04-13 NOTE — ED Notes (Signed)
Attempted to call report to floor x1. 

## 2020-04-13 NOTE — Progress Notes (Signed)
PROGRESS NOTE    Alan [name]  EPP:[id number] DOB: May 04, 1940 DOA: 03/29/2020 PCP: [facility]   Brief Narrative:Alan [name] is a 79 y.o.malewith medical history significant ofdiabetes, PTSD, insomnia, hyperlipidemia, systolic dysfunction CHF, nonischemic cardiomyopathy, hypertension and GERD who was brought in by his caregiver due to worsening mental status. He was completely delirious agitated in the ED. He transferred from [facility] where he was seen for behavioral changes and frequent falls at home. He has had falls in the past with subarachnoid hemorrhage which has resolved. He recovered but continues to to have now more acute onset of confusion and agitation which has been gradual over the last few days to a week. He was seen at the [facility] and evaluated and apparently was thought to have psych issues related to his PTSD, also related to his chronic insomnia. Neurology was consulted who recommends transfer to [facility] for continued evaluation and treatment.   Assessment & Plan:   Principal Problem:   Delirium Active Problems:   NICM (nonischemic cardiomyopathy) (HCC)   Hyperlipidemia   Insomnia   Chronic systolic CHF (congestive heart failure) (HCC)   Uncontrolled type 2 diabetes mellitus with hyperglycemia, without long-term current use of insulin (HCC)   Essential hypertension    #1 Acute Delirium-patient was brought in by caregiver as she was not able to take care of him at home anymore due to patient being more agitated thrashing belligerent with multiple falls, refused Circle of care refusing to eat not sleeping well wandering at home in the middle of the night.  He was unable to recognize his family members. Work-up so far does not show any infectious cause.  UA negative.  Chest x-ray negative.  CT scan of the head shows no acute changes. 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  Will try normal saline 75 cc an hour.  Patient pulled out his first IV.  #5DM HBA11C 8.1 continue SSI.  #6 INSOMNIA Family reported patient has been suffering from insomnia  #7 low TSH T3-T4 pending CT chest shows a thyroid nodule.  #8 CODE STATUS discussed with patient's daughter [name].  She expressed her dad discussed with her recently that he does not want to be resuscitated.  Estimated body mass index is 31.29 kg/m as calculated from the following:   Height as of this encounter: 5\' 10"  (1.778 m).   Weight as of this encounter: 98.9 kg.  DVT prophylaxis: Lovenox  code Status: dnr Family Communication: dw daughter michelle  Disposition Plan:  Status is: Inpatient  Dispo: The patient is from: Home              Anticipated d/c is to: SNF              Anticipated d/c date is: > 3 days              Patient currently is not medically stable to d/c.   Consultants: NEURO  Procedures:NONE Antimicrobials:none  Subjective: Significant other/caregiver at the bedside patient confused, delirious agitated trying to climb about of bed  Objective: Vitals:   04/13/20 1300 04/13/20 1333 04/13/20 1430 04/13/20 1523  BP:  140/77  (!) 139/91  Pulse: 100 (!) 101 83 97  Resp:  20  20  Temp:    98.1 F (36.7 C)  TempSrc:    Oral  SpO2: 96% 94%  95%  Weight:      Height:        Intake/Output Summary (Last 24 hours) at 04/13/2020 1542 Last data filed at 04/13/2020 1253 Gross per 24 hour  Intake 255 ml  Output --  Net 255 ml   Filed Weights   03/28/2020 1106  Weight: 98.9 kg    Examination:  General exam: Appears restless delirious  Respiratory system: Clear to auscultation.  Respiratory effort normal. Cardiovascular system: S1 & S2 heard, RRR. No JVD, murmurs, rubs, gallops or clicks. No pedal edema. Gastrointestinal system: Abdomen is nondistended, soft and nontender. No organomegaly or masses felt. Normal bowel sounds heard. Central nervous system:confused delirious  Extremities: Symmetric 5 x 5 power. Skin: No rashes, lesions or ulcers Psychiatry:unable to assess    Data Reviewed: I have personally reviewed following labs and imaging studies  CBC: Recent Labs  Lab 04/13/2020 1129 04/02/2020 2325 04/12/20 0540  WBC 6.4 7.8 8.8  NEUTROABS 3.9  --   --   HGB 11.5* 12.4* 12.1*  HCT 36.0* 38.5* 37.5*  MCV 82.9 82.6 83.0  PLT 198 215 205   Basic Metabolic Panel: Recent Labs  Lab 04/08/2020 1129 04/04/2020 2209 04/14/2020 2325 04/12/20 0540 04/13/20 0500  NA 136  --   --  141 140  K 4.6  --   --  4.9 4.7  CL 103  --   --  106 108  CO2 24  --   --  24 19*  GLUCOSE 210*  --   --  144* 137*  BUN 21  --   --  16 21  CREATININE 1.18  --  1.03 1.04 1.15  CALCIUM 9.1  --   --  9.5 9.2  MG  --  1.8  --   --   --   PHOS  --  3.4  --   --   --    GFR: Estimated Creatinine Clearance: 62.4 mL/min (by C-G formula based on SCr of 1.15 mg/dL). Liver Function Tests: Recent Labs  Lab 03/26/2020 2209 04/12/20 0540  AST 20 22  ALT 20 18  ALKPHOS 90 86  BILITOT 0.8 0.8  PROT 7.2 6.9  ALBUMIN 3.9 3.7   No results for input(s): LIPASE, AMYLASE in the last 168 hours. Recent Labs  Lab 04/05/2020 2208  AMMONIA 21   Coagulation Profile: No results for input(s): INR, PROTIME in the last 168 hours. Cardiac Enzymes: No results for input(s): CKTOTAL, CKMB, CKMBINDEX, TROPONINI in the last 168 hours. BNP (last 3 results) Recent Labs    04/18/19 1510  PROBNP 975*   HbA1C: Recent Labs    04/20/2020 2325  HGBA1C 8.3*   CBG: Recent Labs  Lab 04/12/20 1158 04/12/20 1729 04/12/20 2203 04/13/20 0805 04/13/20 1254  GLUCAP 143* 156* 125* 144* 149*   Lipid  Profile: No results for input(s): CHOL, HDL, LDLCALC, TRIG, CHOLHDL, LDLDIRECT in the last 72 hours. Thyroid Function Tests: Recent Labs    04/06/2020 2230  TSH 0.140*   Anemia Panel: No results for input(s): VITAMINB12, FOLATE, FERRITIN, TIBC, IRON, RETICCTPCT in the last 72 hours. Sepsis Labs: No results for input(s): PROCALCITON, LATICACIDVEN in the last 168 hours.  Recent Results (from the past 240 hour(s))  Resp Panel by RT-PCR (Flu A&B, Covid) Nasopharyngeal  Swab     Status: None   Collection Time: 03/26/2020  9:07 PM   Specimen: Nasopharyngeal Swab; Nasopharyngeal(NP) swabs in vial transport medium  Result Value Ref Range Status   SARS Coronavirus 2 by RT PCR NEGATIVE NEGATIVE Final    Comment: (NOTE) SARS-CoV-2 target nucleic acids are NOT DETECTED.  The SARS-CoV-2 RNA is generally detectable in upper respiratory specimens during the acute phase of infection. The lowest concentration of SARS-CoV-2 viral copies this assay can detect is 138 copies/mL. A negative result does not preclude SARS-Cov-2 infection and should not be used as the sole basis for treatment or other patient management decisions. A negative result may occur with  improper specimen collection/handling, submission of specimen other than nasopharyngeal swab, presence of viral mutation(s) within the areas targeted by this assay, and inadequate number of viral copies(<138 copies/mL). A negative result must be combined with clinical observations, patient history, and epidemiological information. The expected result is Negative.  Fact Sheet for Patients:  BloggerCourse.com  Fact Sheet for Healthcare Providers:  SeriousBroker.it  This test is no t yet approved or cleared by the [place] FDA and  has been authorized for detection and/or diagnosis of SARS-CoV-2 by FDA under an Emergency Use Authorization (EUA). This EUA will remain  in effect (meaning this  test can be used) for the duration of the COVID-19 declaration under Section 564(b)(1) of the Act, 21 U.S.C.section 360bbb-3(b)(1), unless the authorization is terminated  or revoked sooner.       Influenza A by PCR NEGATIVE NEGATIVE Final   Influenza B by PCR NEGATIVE NEGATIVE Final    Comment: (NOTE) The Xpert Xpress SARS-CoV-2/FLU/RSV plus assay is intended as an aid in the diagnosis of influenza from Nasopharyngeal swab specimens and should not be used as a sole basis for treatment. Nasal washings and aspirates are unacceptable for Xpert Xpress SARS-CoV-2/FLU/RSV testing.  Fact Sheet for Patients: BloggerCourse.com  Fact Sheet for Healthcare Providers: SeriousBroker.it  This test is not yet approved or cleared by the [place] FDA and has been authorized for detection and/or diagnosis of SARS-CoV-2 by FDA under an Emergency Use Authorization (EUA). This EUA will remain in effect (meaning this test can be used) for the duration of the COVID-19 declaration under Section 564(b)(1) of the Act, 21 U.S.C. section 360bbb-3(b)(1), unless the authorization is terminated or revoked.  Performed at [facility], [address], [place], [place] [address]          Radiology Studies: No results found.      Scheduled Meds: . aspirin EC  81 mg Oral Daily  . DULoxetine  30 mg Oral Daily  . enoxaparin (LOVENOX) injection  40 mg Subcutaneous Q24H  . insulin aspart  0-5 Units Subcutaneous QHS  . insulin aspart  0-9 Units Subcutaneous TID WC  . metoprolol succinate  12.5 mg Oral BID  . rosuvastatin  5 mg Oral QHS  . spironolactone  25 mg Oral Daily  . torsemide  20 mg Oral BID   Continuous Infusions: . lactated ringers Stopped (04/12/20 2039)     LOS: 2 days   Alwyn Ren, MD  04/13/2020, 3:42 PM

## 2020-04-14 DIAGNOSIS — F0281 Dementia in other diseases classified elsewhere with behavioral disturbance: Secondary | ICD-10-CM

## 2020-04-14 DIAGNOSIS — G3183 Dementia with Lewy bodies: Secondary | ICD-10-CM

## 2020-04-14 LAB — URINALYSIS, ROUTINE W REFLEX MICROSCOPIC
Bilirubin Urine: NEGATIVE
Glucose, UA: 150 mg/dL — AB
Hgb urine dipstick: NEGATIVE
Ketones, ur: 20 mg/dL — AB
Leukocytes,Ua: NEGATIVE
Nitrite: NEGATIVE
Protein, ur: NEGATIVE mg/dL
Specific Gravity, Urine: 1.019 (ref 1.005–1.030)
pH: 5 (ref 5.0–8.0)

## 2020-04-14 LAB — VITAMIN B12: Vitamin B-12: 693 pg/mL (ref 180–914)

## 2020-04-14 LAB — GLUCOSE, CAPILLARY
Glucose-Capillary: 129 mg/dL — ABNORMAL HIGH (ref 70–99)
Glucose-Capillary: 130 mg/dL — ABNORMAL HIGH (ref 70–99)
Glucose-Capillary: 117 mg/dL — ABNORMAL HIGH (ref 70–99)
Glucose-Capillary: 144 mg/dL — ABNORMAL HIGH (ref 70–99)

## 2020-04-14 LAB — T4, FREE: Free T4: 1.3 ng/dL — ABNORMAL HIGH (ref 0.61–1.12)

## 2020-04-14 MED ORDER — ORAL CARE MOUTH RINSE
15.0000 mL | Freq: Two times a day (BID) | OROMUCOSAL | Status: DC
  Administered 2020-04-14 – 2020-04-19 (×7): 15 mL via OROMUCOSAL

## 2020-04-14 MED ORDER — ALBUTEROL SULFATE (2.5 MG/3ML) 0.083% IN NEBU
2.5000 mg | INHALATION_SOLUTION | RESPIRATORY_TRACT | Status: DC | PRN
  Administered 2020-04-14: 2.5 mg via RESPIRATORY_TRACT
  Filled 2020-04-14: qty 3

## 2020-04-14 MED ORDER — ALBUTEROL SULFATE (2.5 MG/3ML) 0.083% IN NEBU
2.5000 mg | INHALATION_SOLUTION | Freq: Once | RESPIRATORY_TRACT | Status: AC
  Administered 2020-04-15: 2.5 mg via RESPIRATORY_TRACT
  Filled 2020-04-14: qty 3

## 2020-04-14 MED ORDER — VALPROATE SODIUM 500 MG/5ML IV SOLN
250.0000 mg | Freq: Two times a day (BID) | INTRAVENOUS | Status: DC
  Administered 2020-04-14 – 2020-04-18 (×8): 250 mg via INTRAVENOUS
  Filled 2020-04-14 (×14): qty 2.5

## 2020-04-14 NOTE — Consult Note (Signed)
[facility] Face-to-Face Psychiatry Consult   Reason for Consult:  Dementia vs dilirium Referring Physician:  Dr Jerral Ralph Patient Identification: Alan [name] MRN:  [id number] Principal Diagnosis: Delirium Diagnosis:  Principal Problem:   Delirium Active Problems:   NICM (nonischemic cardiomyopathy) (HCC)   Hyperlipidemia   Insomnia   Chronic systolic CHF (congestive heart failure) (HCC)   Uncontrolled type 2 diabetes mellitus with hyperglycemia, without long-term current use of insulin (HCC)   Essential hypertension   Total Time spent with patient: 45 minutes  Subjective:   Alan [name] is a 79 y.o. male patient admitted with AMS, falls.  HPI:   Patient seen and evaluated in person by this provider.  He was completely unclothed and restless in bed, eyes closed, a few words here and there, not logical.   Clearly experiencing delirium on assessment.  CO2 levels are low, compensated respiratory acidosis on admission.  H&P per neuro MD: Alan [name] is an 79 y.o. male with a PMHx of atrial fibrillation, CHF, AAA, DM and severe claustrophobia who presented to the [facility] ED on Friday morning after he had had multiple falls over the past week. He was complaining of headache and difficulty hearing. During his ED stay, he became angry and belligerent, making threats such as that he was going to burn down the facility. He was noted to be more disoriented and confused during his ED stay. EDP contacted the patient's Neurologist, who recommended a medical workup and geribehavioral evaluation as well as admission for agitation and depression, noting that the patient was not competen to refuse care. It was also noted by his outpatient Neurologist that "He has not been sleeping well. He wanders through the house at night. He has a 24 hour caretaker but he is getting to be too much for her. He doesn't recognize his daughter half the time and thinks she is his sister. He states he wishes he would  die. He is not aggressive or violent. He has not tried to commit suicide. He has a psychiatrist at the [place] but will not go to her."  His medical workup showed no acute metabolic cause for his confusion. CT scans show subacute fractures but no ICH. There was no UTI or evidence of infection on work up. Overall presentation was not consistent with a meningitis.  Neurohospitalist service was called to evaluate. On bedside interview with the patient's caretaker, she endorses development over the past year or so, the above symptoms: thrashing in bed while sleeping as though acting out his dreams, waxing and waning cognition with "good days and bad days", a shuffling Parkinsonian gait that comes and goes, and formed visual hallucinations of people with an auditory component. His memory overall has gradually declined over the past few years.   Past Psychiatric History: PTSD, insomnia  Risk to Self:  none Risk to Others:  none Prior Inpatient Therapy:  unknown [facility]  Past Medical History:  Past Medical History:  Diagnosis Date  . A-fib (HCC)   . AAA (abdominal aortic aneurysm) (HCC) 2018  . Arthritis   . CHF (congestive heart failure) (HCC)   . Chronic systolic (congestive) heart failure (HCC)   . Chronic systolic CHF (congestive heart failure) (HCC)   . Claustrophobia    EXTREME   . Diabetes mellitus ORAL MED  . Diverticulosis   . ED (erectile dysfunction)   . GAD (generalized anxiety disorder)   . GERD (gastroesophageal reflux disease)   . Glaucoma of right eye   .  History of diverticulitis   . History of nuclear stress test    MV 1/18: EF 33, no ischemia; high risk  . History of prostate cancer   . Lung nodules   . Malfunction of artificial urethral sphincter (HCC)   . Malfunction of penile prosthesis (HCC)   . NICM (nonischemic cardiomyopathy) (HCC) 02/06/2015   LHC 11/14: EF 35, no significant CAD // Echo 7/16: Diff HK worse in inf base, EF 40%, Gr 1 DD,  mild AI, mild MR, mod LAE // Echo 5/18: Moderate concentric LVH, EF 35-40, inferolateral and anterolateral AK, normal wall motion, moderate AI, moderate MR, moderate LAE, mild PI  . Nocturia   . Presbyesophagus 2018  . Snoring   . SUI (stress urinary incontinence), male   . Thoracic aortic aneurysm without rupture (HCC) 02/06/2015   Chest CTA 1/18:  5.1 cm ascending aortic aneurysm, mild cardiomegaly, mild coronary artery calcification, upper lobe small airway disease, cholelithiasis, 16 mm right thyroid nodule  . Thyroid nodule     Past Surgical History:  Procedure Laterality Date  . ANKLE SURGERY Bilateral   . BALLOON DILATION N/A 12/04/2019   Procedure: BALLOON DILATION;  Surgeon: Sherrilyn Rist, MD;  Location: [facility];  Service: Gastroenterology;  Laterality: N/A;  . BIOPSY THYROID    . COLONOSCOPY  2010  . ESOPHAGOGASTRODUODENOSCOPY N/A 12/04/2019   Procedure: ESOPHAGOGASTRODUODENOSCOPY (EGD);  Surgeon: Sherrilyn Rist, MD;  Location: [facility];  Service: Gastroenterology;  Laterality: N/A;  . I & D/ ORIF RIGHT INDEX FINGER  01-19-2005  . INSERTION INFLATABLE PENILE PROSTHESIS  06-22-1999   MENTOR ALPHA I  . IR GENERIC HISTORICAL  06/17/2016   IR RADIOLOGIST EVAL & MGMT 06/17/2016 Jolaine Click, MD [facility] INTERV RAD  . LEFT HEART CATHETERIZATION WITH CORONARY ANGIOGRAM N/A 03/15/2013   Procedure: LEFT HEART CATHETERIZATION WITH CORONARY ANGIOGRAM;  Surgeon: [name], MD;  Location: [facility] CATH LAB;  Service: Cardiovascular;  Laterality: N/A;  . PENILE PROSTHESIS IMPLANT  09/24/2011   Procedure: PENILE PROTHESIS INFLATABLE;  Surgeon: [name], MD;  Location: [facility];  Service: Urology;  Laterality: N/A;  REPLACEMENT OF INFLATABLE PENILE PROTHESIS (COLOPLAST)    . PLACEMENT ARTIFICIAL URINARY SPHINTER  01-20-2001   [id number]  . REMOVAL AND REPLACEMENT GU SPHINTER CUFF  05-15-2004   STRESS URINARY INCONTINENCE  . RIGHT HEART CATH N/A 06/28/2019    Procedure: RIGHT HEART CATH;  Surgeon: Laurey Morale, MD;  Location: [facility] INVASIVE CV LAB;  Service: Cardiovascular;  Laterality: N/A;   Family History:  Family History  Problem Relation Age of Onset  . Heart Problems Mother   . Heart attack Father   . Healthy Sister        NO HEART ISSUES  . Healthy Sister        NO HEART ISSUES   Family Psychiatric  History: unknown Social History:  Social History   Substance and Sexual Activity  Alcohol Use No     Social History   Substance and Sexual Activity  Drug Use No    Social History   Socioeconomic History  . Marital status: Divorced    Spouse name: Not on file  . Number of children: Not on file  . Years of education: Not on file  . Highest education level: Not on file  Occupational History  . Not on file  Tobacco Use  . Smoking status: Never Smoker  . Smokeless tobacco: Never Used  Vaping Use  . Vaping  Use: Never used  Substance and Sexual Activity  . Alcohol use: No  . Drug use: No  . Sexual activity: Not on file  Other Topics Concern  . Not on file  Social History Narrative  . Not on file   Social Determinants of Health   Financial Resource Strain: Not on file  Food Insecurity: Not on file  Transportation Needs: Not on file  Physical Activity: Not on file  Stress: Not on file  Social Connections: Not on file   Additional Social History:    Allergies:   Allergies  Allergen Reactions  . Ezetimibe Other (See Comments)    Confusion  . Statins Other (See Comments)    Myalgias  . Levaquin [Levofloxacin]     Can cause problem with aneurism, use PCN or Keflex   . Lacosamide Rash  . Other     Pt is extremely claustrophobic, will have a severe panic attack if he is left alone in a small space including an empty hospital room. Especially after waking up from surgery.   . Pravastatin Palpitations  . Ropinirole     Nightmares     Labs:  Results for orders placed or performed during the hospital encounter  of 2020-04-29 (from the past 48 hour(s))  CBG monitoring, ED     Status: Abnormal   Collection Time: 04/12/20  5:29 PM  Result Value Ref Range   Glucose-Capillary 156 (H) 70 - 99 mg/dL    Comment: Glucose reference range applies only to samples taken after fasting for at least 8 hours.  CBG monitoring, ED     Status: Abnormal   Collection Time: 04/12/20 10:03 PM  Result Value Ref Range   Glucose-Capillary 125 (H) 70 - 99 mg/dL    Comment: Glucose reference range applies only to samples taken after fasting for at least 8 hours.  Basic metabolic panel     Status: Abnormal   Collection Time: 04/13/20  5:00 AM  Result Value Ref Range   Sodium 140 135 - 145 mmol/L   Potassium 4.7 3.5 - 5.1 mmol/L   Chloride 108 98 - 111 mmol/L   CO2 19 (L) 22 - 32 mmol/L   Glucose, Bld 137 (H) 70 - 99 mg/dL    Comment: Glucose reference range applies only to samples taken after fasting for at least 8 hours.   BUN 21 8 - 23 mg/dL   Creatinine, Ser 3.83 0.61 - 1.24 mg/dL   Calcium 9.2 8.9 - 33.8 mg/dL   GFR, Estimated >32 >91 mL/min    Comment: (NOTE) Calculated using the CKD-EPI Creatinine Equation (2021)    Anion gap 13 5 - 15    Comment: Performed at [facility], [address], [place], [place] [address]  CBG monitoring, ED     Status: Abnormal   Collection Time: 04/13/20  8:05 AM  Result Value Ref Range   Glucose-Capillary 144 (H) 70 - 99 mg/dL    Comment: Glucose reference range applies only to samples taken after fasting for at least 8 hours.  CBG monitoring, ED     Status: Abnormal   Collection Time: 04/13/20 12:54 PM  Result Value Ref Range   Glucose-Capillary 149 (H) 70 - 99 mg/dL    Comment: Glucose reference range applies only to samples taken after fasting for at least 8 hours.  Glucose, capillary     Status: Abnormal   Collection Time: 04/13/20 10:31 PM  Result Value Ref Range   Glucose-Capillary 162 (H)  70 - 99 mg/dL    Comment: Glucose reference range applies only  to samples taken after fasting for at least 8 hours.  Urinalysis, Routine w reflex microscopic Urine, Catheterized     Status: Abnormal   Collection Time: 04/13/20 10:52 PM  Result Value Ref Range   Color, Urine YELLOW YELLOW   APPearance CLEAR CLEAR   Specific Gravity, Urine 1.019 1.005 - 1.030   pH 5.0 5.0 - 8.0   Glucose, UA 150 (A) NEGATIVE mg/dL   Hgb urine dipstick NEGATIVE NEGATIVE   Bilirubin Urine NEGATIVE NEGATIVE   Ketones, ur 20 (A) NEGATIVE mg/dL   Protein, ur NEGATIVE NEGATIVE mg/dL   Nitrite NEGATIVE NEGATIVE   Leukocytes,Ua NEGATIVE NEGATIVE    Comment: Performed at [facility], [address] [place], [place] [address]  T4, free     Status: Abnormal   Collection Time: 04/14/20  1:43 AM  Result Value Ref Range   Free T4 1.30 (H) 0.61 - 1.12 ng/dL    Comment: (NOTE) Biotin ingestion may interfere with free T4 tests. If the results are inconsistent with the TSH level, previous test results, or the clinical presentation, then consider biotin interference. If needed, order repeat testing after stopping biotin. Performed at [facility], [address], [place], [place] [address]   Vitamin B12     Status: None   Collection Time: 04/14/20  1:43 AM  Result Value Ref Range   Vitamin B-12 693 180 - 914 pg/mL    Comment: (NOTE) This assay is not validated for testing neonatal or myeloproliferative syndrome specimens for Vitamin B12 levels. Performed at [facility], [address] [place], [place] [address]   Glucose, capillary     Status: Abnormal   Collection Time: 04/14/20  9:52 AM  Result Value Ref Range   Glucose-Capillary 129 (H) 70 - 99 mg/dL    Comment: Glucose reference range applies only to samples taken after fasting for at least 8 hours.  Glucose, capillary     Status: Abnormal   Collection Time: 04/14/20 11:10 AM  Result Value Ref Range   Glucose-Capillary 130 (H) 70 - 99 mg/dL    Comment: Glucose reference range applies only to  samples taken after fasting for at least 8 hours.    Current Facility-Administered Medications  Medication Dose Route Frequency Provider Last Rate Last Admin  . 0.9 %  sodium chloride infusion   Intravenous Continuous Alwyn Ren, MD      . acetaminophen (TYLENOL) tablet 650 mg  650 mg Oral Q6H PRN Rometta Emery, MD       Or  . acetaminophen (TYLENOL) suppository 650 mg  650 mg Rectal Q6H PRN Rometta Emery, MD      . aspirin EC tablet 81 mg  81 mg Oral Daily Noralee Stain, DO      . DULoxetine (CYMBALTA) DR capsule 30 mg  30 mg Oral Daily Noralee Stain, DO      . enoxaparin (LOVENOX) injection 40 mg  40 mg Subcutaneous Q24H Earlie Lou L, MD   40 mg at 04/14/20 0941  . feeding supplement (ENSURE ENLIVE / ENSURE PLUS) liquid 237 mL  237 mL Oral BID BM Zannie Cove, MD      . insulin aspart (novoLOG) injection 0-5 Units  0-5 Units Subcutaneous QHS Garba, Mohammad L, MD      . insulin aspart (novoLOG) injection 0-9 Units  0-9 Units Subcutaneous TID WC Rometta Emery, MD  1 Units at 04/14/20 0953  . lactated ringers infusion   Intravenous Continuous Arby Barrette, MD 125 mL/hr at 04/14/20 1256 New Bag at 04/14/20 1256  . LORazepam (ATIVAN) injection 1 mg  1 mg Intramuscular Q4H PRN Audrea Muscat T, NP   1 mg at 04/14/20 0134  . MEDLINE mouth rinse  15 mL Mouth Rinse BID Maretta Bees, MD   15 mL at 04/14/20 0940  . metoprolol succinate (TOPROL-XL) 24 hr tablet 12.5 mg  12.5 mg Oral BID Noralee Stain, DO   12.5 mg at 04/13/20 2232  . metoprolol tartrate (LOPRESSOR) injection 5 mg  5 mg Intravenous Q6H PRN Earlie Lou L, MD      . rosuvastatin (CRESTOR) tablet 5 mg  5 mg Oral QHS Noralee Stain, DO   5 mg at 04/13/20 2232    Musculoskeletal: Strength & Muscle Tone: within normal limits Gait & Station: did not witness Patient leans: N/A  Psychiatric Specialty Exam: Physical Exam Vitals and nursing note reviewed.  HENT:     Head: Normocephalic.      Nose: Nose normal.  Pulmonary:     Effort: Pulmonary effort is normal.  Musculoskeletal:        General: Normal range of motion.     Cervical back: Normal range of motion.  Neurological:     Mental Status: He is alert. He is disoriented.  Psychiatric:        Attention and Perception: He is inattentive.        Mood and Affect: Mood is anxious. Affect is blunt.        Speech: He is noncommunicative.        Behavior: Behavior is agitated.        Cognition and Memory: Cognition is impaired. Memory is impaired.        Judgment: Judgment is impulsive and inappropriate.     Review of Systems  Psychiatric/Behavioral: Positive for agitation and confusion. The patient is nervous/anxious.   All other systems reviewed and are negative.   Blood pressure (!) 145/67, pulse 99, temperature 98 F (36.7 C), temperature source Axillary, resp. rate (!) 21, height  (1.778 m), weight 98.9 kg, SpO2 98 %.Body mass index is 31.29 kg/m.  General Appearance: Disheveled  Eye Contact:  None  Speech:  a word everyone once in awhile  Volume:  Increased  Mood:  Anxious  Affect:  Blunt  Thought Process:  Irrelevant  Orientation:  Other:  not oriented, alert  Thought Content:  unable to assess based on his current state of confusion  Suicidal Thoughts:  unable to assess  Homicidal Thoughts:  unable to assess  Memory:  Negative  Judgement:  Impaired  Insight:  Lacking  Psychomotor Activity:  Increased  Concentration:  Concentration: Poor and Attention Span: Poor  Recall:  Negative  Fund of Knowledge:  unable to assess  Language:  Poor  Akathisia:  NA  Handed:  Right  AIMS (if indicated):     Assets:  Leisure Time Social Support  ADL's:  Impaired  Cognition:  Impaired,  Severe  Sleep:       Treatment Plan Summary: Daily contact with patient to assess and evaluate symptoms and progress in treatment, Medication management and Plan Delirium:   Patient likely has ongoing delirium which presents  as fluctuating cognition.  At time of this assessment patient does not have capacity to make decisions.  The patient's exam is notable for altered sensorium, perceptual disturbances, disorientation and cognitive deficits that  appear markedly different than their baseline, suggesting a diagnosis of delirium.  Virtually any medical condition or physiologic stress can precipitate delirium in a susceptible individual, with risk increasing in those with: advanced age, sensory impairments, organic brain disease (stroke, dementia, Parkinsons), psychiatric illness, major chronic medical issues, prolonged hospitalizations, postoperative status, anemia, insomnia/disturbed sleep, and severe pain. Addressing the underlying medical condition and institution of preventative measures are recommended.    - Continue to monitor and treat underlying medical causes of delirium, including infection, electrolyte disturbances, etc. - Delirium precautions - Minimize/avoid deliriogenic meds including: anticholinergic, opiates, benzodiazepines           - Maintain hydration, oxygenation, nutrition           - Limit use of restraints and catheters           - Normalize sleep patterns by minimizing nighttime noise, light and interruptions by     -Ensure sleep apnea treatment is provided overnight.             clustering care, opening blinds during the day           - Reorient the patient frequently, provide easily visible clock and calendar           - Provide sensory aids like glasses, hearing aids           - Encourage ambulation, regular activities and visitors to maintain cognitive stimulation   -Patient would benefit from having family members at bedside to reinforce his orientation.  Disposition: Patient does not meet criteria for psychiatric inpatient admission. Supportive therapy provided about ongoing stressors.  Nanine Means, NP 04/14/2020 1:17 PM

## 2020-04-14 NOTE — Progress Notes (Signed)
PROGRESS NOTE    Alan [name]  YFV:[id number] DOB: [birth year] DOA: 04/21/2020 PCP: [facility]    Brief Narrative:  79 year old gentleman with history of type 2 diabetes on insulin, PTSD, insomnia, hyperlipidemia, nonischemic cardiomyopathy, hypertension and GERD brought to the hospital by his caregiver with worsening mental status, delirium and agitation.  Patient is a [place] veteran, history of severe insomnia and PTSD.  He lives at home with his caretaker who noticed worsening symptoms for about 1 week.  Has been falling at home, impulsive and delirious. Admitted to hospital for neuro work-up, psychiatric symptom stabilization.   Assessment & Plan:   Principal Problem:   Delirium Active Problems:   NICM (nonischemic cardiomyopathy) (HCC)   Hyperlipidemia   Insomnia   Chronic systolic CHF (congestive heart failure) (HCC)   Uncontrolled type 2 diabetes mellitus with hyperglycemia, without long-term current use of insulin (HCC)   Essential hypertension  Delirium/acute metabolic encephalopathy in the setting of underlying multiple psychiatric issues including PTSD and insomnia: Belligerent, multiple falls, refusing to medical care and not sleeping, wandering at home in the middle of night. Unsafe to stay home. Infection and metabolic work-up negative so far. Seen by neurology and suspecting Lewy body dementia. MRI with and without contrast today with anesthesia. Symptomatic management, currently on as needed Ativan for symptom control. Consult psychiatry, patient is IVC by family.  May need Geri psych placement or placement to memory care unit. Will try stabilization of symptoms with the help of psychiatrist.  Chronic A. fib: Rate controlled.  On beta-blockers.  Multiple falls and deconditioning: All-time fall precautions delirium precautions.  Will consult PT OT today if he follows command.  Coronary artery disease/nonischemic cardiomyopathy: Euvolemic.   Currently on maintenance IV fluid as he is not eating adequate.  Type 2 diabetes on insulin at home: Continued on lower doses.  Overall poor prognosis with ongoing psychiatric symptoms, poor quality of life. I discussed with patient's caretaker who has known him for more than 40 years now and has been living with him for more than a year now. Will need work-up including any reversible causes, will need psychiatric symptom management and stabilization before discharge. He is not able to live at home and current situation and caretakers not able to take care of him also.   DVT prophylaxis: enoxaparin (LOVENOX) injection 40 mg Start: 04/12/20 1000 SCDs Start: 04/03/2020 2325   Code Status: DNR Family Communication: Sherol is patient's caretaker prolonged time and significant other at the bedside.  She will communicate with his daughter. Disposition Plan: Status is: Inpatient  Remains inpatient appropriate because:Ongoing diagnostic testing needed not appropriate for outpatient work up, Unsafe d/c plan and Inpatient level of care appropriate due to severity of illness   Dispo: The patient is from: Home              Anticipated d/c is to: SNF              Anticipated d/c date is: > 3 days              Patient currently is not medically stable to d/c.         Consultants:   Neurology  Psychiatry  Procedures:   None  Antimicrobials:   None   Subjective: Patient seen and examined.  He was totally sleepy, later on woke up and mumbling to himself.  Did not participate in conversation.  Did not open his eyes.  He received 1 dose of Ativan at middle  of the night and has been sleeping since then.  He wakes up and shouts some incomprehensible speech in between. Called and discussed with MRI for MRI with anesthesia and it will be scheduled today.  We will keep n.p.o.  Objective: Vitals:   04/14/20 0000 04/14/20 0200 04/14/20 0424 04/14/20 0930  BP:   (!) 161/73 (!) 145/67   Pulse: (!) 107 (!) 109 84 99  Resp: (!) 21 (!) 21 (!) 21   Temp:    98 F (36.7 C)  TempSrc:    Axillary  SpO2: 96% 96% 95% 98%  Weight:      Height:        Intake/Output Summary (Last 24 hours) at 04/14/2020 1154 Last data filed at 04/14/2020 2836 Gross per 24 hour  Intake 1254 ml  Output --  Net 1254 ml   Filed Weights   04/05/2020 1106  Weight: 98.9 kg    Examination:  General exam: Appears restless, delirious, agitated.  Easily distractible. Alert on stimulation but not oriented. Respiratory system: Clear to auscultation. Respiratory effort normal. Cardiovascular system: S1 & S2 heard, RRR. No JVD, murmurs, rubs, gallops or clicks. No pedal edema. Gastrointestinal system: Abdomen is nondistended, soft and nontender. No organomegaly or masses felt. Normal bowel sounds heard. Central nervous system: No focal neurological deficits.  Moves all extremities. Extremities: Symmetric 5 x 5 power.     Data Reviewed: I have personally reviewed following labs and imaging studies  CBC: Recent Labs  Lab 03/26/2020 1129 04/23/2020 2325 04/12/20 0540  WBC 6.4 7.8 8.8  NEUTROABS 3.9  --   --   HGB 11.5* 12.4* 12.1*  HCT 36.0* 38.5* 37.5*  MCV 82.9 82.6 83.0  PLT 198 215 205   Basic Metabolic Panel: Recent Labs  Lab 04/18/2020 1129 03/29/2020 2209 04/17/2020 2325 04/12/20 0540 04/13/20 0500  NA 136  --   --  141 140  K 4.6  --   --  4.9 4.7  CL 103  --   --  106 108  CO2 24  --   --  24 19*  GLUCOSE 210*  --   --  144* 137*  BUN 21  --   --  16 21  CREATININE 1.18  --  1.03 1.04 1.15  CALCIUM 9.1  --   --  9.5 9.2  MG  --  1.8  --   --   --   PHOS  --  3.4  --   --   --    GFR: Estimated Creatinine Clearance: 62.4 mL/min (by C-G formula based on SCr of 1.15 mg/dL). Liver Function Tests: Recent Labs  Lab 04/18/2020 2209 04/12/20 0540  AST 20 22  ALT 20 18  ALKPHOS 90 86  BILITOT 0.8 0.8  PROT 7.2 6.9  ALBUMIN 3.9 3.7   No results for input(s): LIPASE, AMYLASE  in the last 168 hours. Recent Labs  Lab 04/20/2020 2208  AMMONIA 21   Coagulation Profile: No results for input(s): INR, PROTIME in the last 168 hours. Cardiac Enzymes: No results for input(s): CKTOTAL, CKMB, CKMBINDEX, TROPONINI in the last 168 hours. BNP (last 3 results) Recent Labs    04/18/19 1510  PROBNP 975*   HbA1C: Recent Labs    04/01/2020 2325  HGBA1C 8.3*   CBG: Recent Labs  Lab 04/13/20 0805 04/13/20 1254 04/13/20 2231 04/14/20 0952 04/14/20 1110  GLUCAP 144* 149* 162* 129* 130*   Lipid Profile: No results for input(s): CHOL, HDL, LDLCALC, TRIG, CHOLHDL,  LDLDIRECT in the last 72 hours. Thyroid Function Tests: Recent Labs    04/12/2020 2230 04/14/20 0143  TSH 0.140*  --   FREET4  --  1.30*   Anemia Panel: Recent Labs    04/14/20 0143  VITAMINB12 693   Sepsis Labs: No results for input(s): PROCALCITON, LATICACIDVEN in the last 168 hours.  Recent Results (from the past 240 hour(s))  Resp Panel by RT-PCR (Flu A&B, Covid) Nasopharyngeal Swab     Status: None   Collection Time: 03/27/2020  9:07 PM   Specimen: Nasopharyngeal Swab; Nasopharyngeal(NP) swabs in vial transport medium  Result Value Ref Range Status   SARS Coronavirus 2 by RT PCR NEGATIVE NEGATIVE Final    Comment: (NOTE) SARS-CoV-2 target nucleic acids are NOT DETECTED.  The SARS-CoV-2 RNA is generally detectable in upper respiratory specimens during the acute phase of infection. The lowest concentration of SARS-CoV-2 viral copies this assay can detect is 138 copies/mL. A negative result does not preclude SARS-Cov-2 infection and should not be used as the sole basis for treatment or other patient management decisions. A negative result may occur with  improper specimen collection/handling, submission of specimen other than nasopharyngeal swab, presence of viral mutation(s) within the areas targeted by this assay, and inadequate number of viral copies(<138 copies/mL). A negative result must  be combined with clinical observations, patient history, and epidemiological information. The expected result is Negative.  Fact Sheet for Patients:  BloggerCourse.com  Fact Sheet for Healthcare Providers:  SeriousBroker.it  This test is no t yet approved or cleared by the [place] FDA and  has been authorized for detection and/or diagnosis of SARS-CoV-2 by FDA under an Emergency Use Authorization (EUA). This EUA will remain  in effect (meaning this test can be used) for the duration of the COVID-19 declaration under Section 564(b)(1) of the Act, 21 U.S.C.section 360bbb-3(b)(1), unless the authorization is terminated  or revoked sooner.       Influenza A by PCR NEGATIVE NEGATIVE Final   Influenza B by PCR NEGATIVE NEGATIVE Final    Comment: (NOTE) The Xpert Xpress SARS-CoV-2/FLU/RSV plus assay is intended as an aid in the diagnosis of influenza from Nasopharyngeal swab specimens and should not be used as a sole basis for treatment. Nasal washings and aspirates are unacceptable for Xpert Xpress SARS-CoV-2/FLU/RSV testing.  Fact Sheet for Patients: BloggerCourse.com  Fact Sheet for Healthcare Providers: SeriousBroker.it  This test is not yet approved or cleared by the [place] FDA and has been authorized for detection and/or diagnosis of SARS-CoV-2 by FDA under an Emergency Use Authorization (EUA). This EUA will remain in effect (meaning this test can be used) for the duration of the COVID-19 declaration under Section 564(b)(1) of the Act, 21 U.S.C. section 360bbb-3(b)(1), unless the authorization is terminated or revoked.  Performed at [facility], [address], [place], [place] [address]          Radiology Studies: No results found.      Scheduled Meds: . aspirin EC  81 mg Oral Daily  . DULoxetine  30 mg Oral Daily  .  enoxaparin (LOVENOX) injection  40 mg Subcutaneous Q24H  . feeding supplement  237 mL Oral BID BM  . insulin aspart  0-5 Units Subcutaneous QHS  . insulin aspart  0-9 Units Subcutaneous TID WC  . mouth rinse  15 mL Mouth Rinse BID  . metoprolol succinate  12.5 mg Oral BID  . rosuvastatin  5 mg Oral QHS   Continuous Infusions: .  sodium chloride    . lactated ringers 125 mL/hr at 04/13/20 2111     LOS: 3 days    Time spent: 35 minutes    Dorcas Carrow, MD [facility] Pager [phone]

## 2020-04-14 NOTE — Anesthesia Preprocedure Evaluation (Addendum)
Anesthesia Evaluation  Patient identified by MRN, date of birth, ID band Patient confused    Reviewed: Allergy & Precautions, H&P , NPO status , Patient's Chart, lab work & pertinent test results  Airway Mallampati: III  TM Distance: >3 FB Neck ROM: Full    Dental no notable dental hx. (+) Poor Dentition, Dental Advisory Given   Pulmonary neg pulmonary ROS,    Pulmonary exam normal breath sounds clear to auscultation       Cardiovascular Exercise Tolerance: Good hypertension, +CHF   Rhythm:Regular Rate:Normal     Neuro/Psych Anxiety negative neurological ROS     GI/Hepatic Neg liver ROS, GERD  Medicated,  Endo/Other  diabetes, Oral Hypoglycemic Agents, Insulin Dependent  Renal/GU negative Renal ROS  negative genitourinary   Musculoskeletal  (+) Arthritis , Osteoarthritis,    Abdominal   Peds  Hematology negative hematology ROS (+)   Anesthesia Other Findings   Reproductive/Obstetrics negative OB ROS                            Anesthesia Physical Anesthesia Plan  ASA: III  Anesthesia Plan: General   Post-op Pain Management:    Induction: Intravenous  PONV Risk Score and Plan: 2 and Ondansetron and Treatment may vary due to age or medical condition  Airway Management Planned: Oral ETT  Additional Equipment:   Intra-op Plan:   Post-operative Plan: Extubation in OR  Informed Consent: I have reviewed the patients History and Physical, chart, labs and discussed the procedure including the risks, benefits and alternatives for the proposed anesthesia with the patient or authorized representative who has indicated his/her understanding and acceptance.   Patient has DNR.  Discussed DNR with power of attorney and Continue DNR.   Dental advisory given  Plan Discussed with: CRNA  Anesthesia Plan Comments:        Anesthesia Quick Evaluation

## 2020-04-14 NOTE — Plan of Care (Signed)

## 2020-04-14 NOTE — Evaluation (Signed)
Physical Therapy Evaluation Patient Details Name: Alan [name] MRN: [id number] DOB: May 26, 1940 Today's Date: 04/14/2020   History of Present Illness  Alan [name] is a 79 y.o. male with medical history significant of diabetes, PTSD, insomnia, hyperlipidemia, systolic dysfunction CHF, nonischemic cardiomyopathy, hypertension and GERD among other things who was brought in by his caregiver due to worsening mental status.  He is completely delirious agitated.  He was originally at [facility] where he was seen for behavioral changes and frequent falls at home.  He has had fall in the past with subarachnoid hemorrhage which has resolved.  He recovered but continues to to have now more acute onset of confusion and agitation which has been gradual over the last few days to a week.  He was seen at the [facility] and evaluated and apparently was thought to have psych issues related to his PTSD.  Also related to his chronic insomnia.  He was having hallucinations apparently agitation and overall altered mental status.  Patient was evaluated here work-up initially negative.  Consult given to neurology who recommends transfer to [facility] for continued evaluation and treatment..  Clinical Impression   Patient received in bed, scooted down toward bottom, mitts donned. Restless and fidgeting in bed. Patient's caregiver present in room. He is able to follow simple commands at times, speech is difficult to understand and garbled. He requires max +2 assist for rolling in bed for peri-care and pad changing. Requires +2 max assist for scooting up in bed. Patient is unsafe to attempt sitting up at this time. He will continue to benefit from skilled PT while here to improve functional mobility and independence for reduced caregiver burden. All will depend on cognitive improvement.    Follow Up Recommendations SNF;Supervision/Assistance - 24 hour    Equipment Recommendations  None recommended by  PT;Other (comment) (TBD)    Recommendations for Other Services       Precautions / Restrictions Precautions Precautions: Fall Restrictions Weight Bearing Restrictions: No      Mobility  Bed Mobility Overal bed mobility: Needs Assistance Bed Mobility: Rolling Rolling: Max assist;+2 for physical assistance              Transfers                 General transfer comment: not attempted due to confusion/agitation  Ambulation/Gait             General Gait Details: not attempted  Stairs            Wheelchair Mobility    Modified Rankin (Stroke Patients Only)       Balance                                             Pertinent Vitals/Pain Pain Assessment: Faces Faces Pain Scale: No hurt    Home Living Family/patient expects to be discharged to:: Other (Comment) (geri psych unit?) Living Arrangements: Non-relatives/Friends               Additional Comments: lives with caregiver    Prior Function Level of Independence: Needs assistance   Gait / Transfers Assistance Needed: falling more frequently at home  ADL's / Homemaking Assistance Needed: requires assistance        Hand Dominance        Extremity/Trunk Assessment   Upper Extremity Assessment Upper Extremity  Assessment: Difficult to assess due to impaired cognition    Lower Extremity Assessment Lower Extremity Assessment: Difficult to assess due to impaired cognition       Communication   Communication: Other (comment) (patient with garbled speech, difficult to understand)  Cognition Arousal/Alertness: Lethargic Behavior During Therapy: Restless Overall Cognitive Status: Impaired/Different from baseline Area of Impairment: Orientation;Awareness;Attention;Problem solving;Memory;Following commands;Safety/judgement                 Orientation Level: Disoriented to;Place;Time;Situation Current Attention Level: Sustained   Following Commands:  Follows one step commands inconsistently Safety/Judgement: Decreased awareness of deficits;Decreased awareness of safety Awareness: Emergent Problem Solving: Slow processing;Decreased initiation;Requires verbal cues;Requires tactile cues General Comments: patient able to tell me his last name and opened his eyes on command briefly.      General Comments      Exercises     Assessment/Plan    PT Assessment Patient needs continued PT services  PT Problem List Decreased mobility;Decreased safety awareness;Decreased activity tolerance;Decreased cognition       PT Treatment Interventions Therapeutic activities;Therapeutic exercise;Functional mobility training;Patient/family education;Balance training;Gait training    PT Goals (Current goals can be found in the Care Plan section)  Acute Rehab PT Goals Patient Stated Goal: to improve mental status PT Goal Formulation: Patient unable to participate in goal setting Time For Goal Achievement: 04/25/20 Potential to Achieve Goals: Fair    Frequency Min 2X/week   Barriers to discharge Decreased caregiver support patient's caregiver is unable to manage him at this time.    Co-evaluation               AM-PAC PT "6 Clicks" Mobility  Outcome Measure Help needed turning from your back to your side while in a flat bed without using bedrails?: Total Help needed moving from lying on your back to sitting on the side of a flat bed without using bedrails?: Total Help needed moving to and from a bed to a chair (including a wheelchair)?: Total Help needed standing up from a chair using your arms (e.g., wheelchair or bedside chair)?: Total Help needed to walk in hospital room?: Total Help needed climbing 3-5 steps with a railing? : Total 6 Click Score: 6    End of Session Equipment Utilized During Treatment: Oxygen Activity Tolerance: Treatment limited secondary to agitation Patient left: in bed;with call bell/phone within reach;with bed  alarm set;with family/visitor present Nurse Communication: Mobility status PT Visit Diagnosis: Other abnormalities of gait and mobility (R26.89)    Time: 4782-9562 PT Time Calculation (min) (ACUTE ONLY): 17 min   Charges:   PT Evaluation $PT Eval Moderate Complexity: 1 Mod PT Treatments $Therapeutic Activity: 8-22 mins        Aasha Dina, PT, GCS 04/14/20,4:16 PM

## 2020-04-14 NOTE — Progress Notes (Signed)
Initial Nutrition Assessment  DOCUMENTATION CODES:   Obesity unspecified  INTERVENTION:   -Ensure Enlive po BID, each supplement provides 350 kcal and 20 grams of protein  NUTRITION DIAGNOSIS:   Inadequate oral intake related to  (altered mental status) as evidenced by NPO status.  GOAL:   Patient will meet greater than or equal to 90% of their needs  MONITOR:   Labs,Weight trends,I & O's,Diet advancement  REASON FOR ASSESSMENT:   Malnutrition Screening Tool    ASSESSMENT:   79 year old gentleman with history of type 2 diabetes on insulin, PTSD, insomnia, hyperlipidemia, nonischemic cardiomyopathy, hypertension and GERD brought to the hospital by his caregiver with worsening mental status, delirium and agitation.  Patient is a [place] veteran, history of severe insomnia and PTSD.  He lives at home with his caretaker who noticed worsening symptoms for about 1 week.  Has been falling at home, impulsive and delirious.  Admitted to hospital for neuro work-up, psychiatric symptom stabilization.  Patient continues to be disoriented x 4. Per chart review, pt has had worsening mental status over the past week. Suspect has not been eating well during this time. Pt has not been awake enough to drink supplements or take oral meds.  Has been NPO since admission but will leave Ensure order in case diet is advanced and pt more alert/awake.  Per weight records, pt has lost 20 lbs since 9/14 (8% wt loss x 3 months, significant for time frame).  Medications: Lactated ringers  Labs reviewed: CBGs: 129-130  NUTRITION - FOCUSED PHYSICAL EXAM:  Unable to complete  Diet Order:   Diet Order            Diet NPO time specified  Diet effective now                 EDUCATION NEEDS:   No education needs have been identified at this time  Skin:  Skin Assessment: Reviewed RN Assessment  Last BM:  12/18  Height:   Ht Readings from Last 1 Encounters:  04/07/2020 5\' 10"  (1.778 m)     Weight:   Wt Readings from Last 1 Encounters:  04/03/2020 98.9 kg   BMI:  Body mass index is 31.29 kg/m.  Estimated Nutritional Needs:   Kcal:  2000-2200  Protein:  90-100g  Fluid:  2L/day  04/13/20, MS, RD, LDN Inpatient Clinical Dietitian Contact information available via Amion

## 2020-04-14 NOTE — Plan of Care (Signed)

## 2020-04-14 NOTE — Progress Notes (Signed)
Pt continues to be agitated, restlessness and AMS. Pt pulled out his IV's. Reached out to on call MD with patient status. Obtained PRN orders for agitation. See MAR.

## 2020-04-14 NOTE — Progress Notes (Signed)
Original Consult Reason: AMS Brief HPI: Alan [name] is a 79 year old [name] with a history significant for AF on beta-blocker for rate control, CHF, AAA, DM, PTSD, severe claustrophobia, Lewy-body dementia, chronic insomnia, and multiple recent falls with a history of a traumatic SAH after a fall in January of 2021. He was brought to the ED 12/19 by care-givers for concern of approximately one week of progressive disorientation, agitation, confusion, and belligerent behavior. Patient also refusing oral intake for 2-3 days prior to admission.   Baseline behavior includes progressive development of behavior for approximately one year with thrashing in bed while sleeping, waxing and waning cognition, intermittent shuffling Parkinsonian gait, visual and auditoy hallucinations, and progressive loss of memory.   Subjective: 79 year old [name] with progressive cognitive decline, LBD, PTSD, insomnia, and acute delirium. Patient in bed with mixed somnolence and agitation. Intermittent incoherent agitated vocalizations. No caregiver at bedside for collateral information.   Exam: Vitals:   12/20/Alan 0424 12/20/Alan 0930  BP: (!) 161/73 (!) 145/67  Pulse: 84 99  Resp: (!) Alan   Temp:  98 F (36.7 C)  SpO2: 95% 98%   Gen: In bed, somnolent and agitated.  Resp: non-labored breathing, audible inspiratory wheezing noted Abd: soft, non-distended  Neuro:  MS: Agitated delirium with mild limb thrashing and incoherent agitated vocalizations. Resists eye opening, does not follow commands. Does not respond to verbal stimuli.  CN: II: PERRL. Does not allow examiner to lift eyelids long enough to test for blink to threat. Pupils 8mm --> 55mm and briskly reactive with examiner eye opening.   III,IV, VI: Eyes are conjugate without forced gaze deviation. Does not track examiner.  V,VII: Reacts equally to eyelid stimulation bilaterally. Face symmetric.  VIII, IX, X: Unable to assess IX,X: Phonation intact XI: Head is  midline XII: Unable to assess Motor: Intermittent agitated movements of upper and lower extremities bilaterally. Grasp reflex present in bilateral upper extremities. Moves bilateral lower extremities equally and spontaneously and to plantar stimulation. Sensory: Withdrawals to noxious stimuli, unable to assess sensation to light touch or 2-point discrimination due to patient altered mental status. DTR: 2+ brachial and radial reflexes bilaterally, 1+ patellae.   Pertinent Labs: Troponin 32, hemoglobin A1c 8.3%, glucose 137, glucosuria with ketones  Impression: Alan [name] is a 79 year old [name] with a history significant for AF on beta-blocker for rate control, CHF, AAA, DM, PTSD, severe claustrophobia, Lewy-body dementia, chronic insomnia, and multiple recent falls with a history of a traumatic SAH after a fall in January of 2021. He was brought to the ED with approximately one week of progressive decline in mental status and oral intake, increase in delirium and agitation.  CTH revealed no acute intracranial process. Lab results are negative for a metabolic concern for AMS.  Patient with Lewy-body dementia and increasing agitation. LBD likely causing the acute delirium and cognitive decline with a negative AMS work-up.  TSH low 0.14; Free T4 1.3  Recommendations: - MRI ordered: patient agitated and severely claustrophobic. Requires sedation for MRI; overall risk of MRI with sedation greater than benefit at this time - Depacon 250 mg IV BID ordered with no PO intake and persistent agitation - Avoid Haldol at this time: Avoid neuroleptics in patients with possible or confirmed Lewy body dementia, as these medications can result in significant motor and cognitive worsening.  - Consider Aricept trial when he stabilizes - Recommend Palliative Care consultation to discuss goals of care with family.   Attestation:  I saw this patient with the APP on  12/20/Alan and agree with the history and  examination findings as documented with the following clarifications.  Patient was moaning/hollaring when I entered the room. As I spoke to him, he appeared to calm, as if listening. He would not answer any questions or follow any commands. He had forced eye closure, so that I could not get a good look at his pupils. Mild rigidity, right arm/leg greater than left. I reviewed the available laboratory data and neuroimages, and other relevant tests/notes/procedures.  My examination findings include hypertonicity, agitation, incomprehensible speech, and no demonstrable language comprehension.   Our impression at this time is acute delirium superimposed on late-stage LBD.  Our recommendations are to consider medications that may be crushed/given in small amounts of applesauce to address agitation such as citalopram 20 mg/d, donepezil 5 mg/d, and depakote sprinkles (or elixir) 250 mg bid. The latter may be given iv since he appears incapable of taking medications by mouth.  Also, given lack of po intake, and ketones in urine, he's probably significantly dehydrated, which can also contribute to delirium. He appears to be on a downward spiral with lack of po intake precipitating worsening behaviors and dehydration, which prevent him from taking in fluids. Palliative care consultation would be appropriate in this setting.   Thank you.  Meredeth Ide, MD

## 2020-04-15 ENCOUNTER — Inpatient Hospital Stay (HOSPITAL_COMMUNITY): Payer: Medicare Other

## 2020-04-15 ENCOUNTER — Inpatient Hospital Stay (HOSPITAL_COMMUNITY): Payer: Medicare Other | Admitting: Anesthesiology

## 2020-04-15 ENCOUNTER — Encounter (HOSPITAL_COMMUNITY): Payer: Self-pay | Admitting: Internal Medicine

## 2020-04-15 ENCOUNTER — Encounter (HOSPITAL_COMMUNITY): Admission: EM | Disposition: E | Payer: Self-pay | Source: Home / Self Care | Attending: Internal Medicine

## 2020-04-15 DIAGNOSIS — S22000A Wedge compression fracture of unspecified thoracic vertebra, initial encounter for closed fracture: Secondary | ICD-10-CM

## 2020-04-15 DIAGNOSIS — I5022 Chronic systolic (congestive) heart failure: Secondary | ICD-10-CM

## 2020-04-15 HISTORY — PX: RADIOLOGY WITH ANESTHESIA: SHX6223

## 2020-04-15 LAB — GLUCOSE, CAPILLARY
Glucose-Capillary: 146 mg/dL — ABNORMAL HIGH (ref 70–99)
Glucose-Capillary: 146 mg/dL — ABNORMAL HIGH (ref 70–99)
Glucose-Capillary: 161 mg/dL — ABNORMAL HIGH (ref 70–99)
Glucose-Capillary: 171 mg/dL — ABNORMAL HIGH (ref 70–99)
Glucose-Capillary: 165 mg/dL — ABNORMAL HIGH (ref 70–99)

## 2020-04-15 LAB — T3, FREE: T3, Free: 2.4 pg/mL (ref 2.0–4.4)

## 2020-04-15 SURGERY — MRI WITH ANESTHESIA
Anesthesia: General

## 2020-04-15 MED ORDER — SUGAMMADEX SODIUM 200 MG/2ML IV SOLN
INTRAVENOUS | Status: DC | PRN
  Administered 2020-04-15: 200 mg via INTRAVENOUS

## 2020-04-15 MED ORDER — ORAL CARE MOUTH RINSE: 15.0000 mL | Freq: Once | OROMUCOSAL | Status: AC

## 2020-04-15 MED ORDER — OXYCODONE HCL 5 MG PO TABS: 5.0000 mg | ORAL_TABLET | ORAL | Status: DC | PRN

## 2020-04-15 MED ORDER — DONEPEZIL HCL 10 MG PO TABS: 5.0000 mg | ORAL_TABLET | Freq: Every day | ORAL | Status: DC

## 2020-04-15 MED ORDER — LACTATED RINGERS IV SOLN: INTRAVENOUS | Status: DC

## 2020-04-15 MED ORDER — PROPOFOL 10 MG/ML IV BOLUS
INTRAVENOUS | Status: DC | PRN
  Administered 2020-04-15: 100 mg via INTRAVENOUS

## 2020-04-15 MED ORDER — MIDAZOLAM HCL 2 MG/2ML IJ SOLN
INTRAMUSCULAR | Status: DC | PRN
  Administered 2020-04-15: 1 mg via INTRAVENOUS

## 2020-04-15 MED ORDER — SENNA 8.6 MG PO TABS: 1.0000 | ORAL_TABLET | Freq: Every day | ORAL | Status: DC

## 2020-04-15 MED ORDER — EPHEDRINE SULFATE-NACL 50-0.9 MG/10ML-% IV SOSY
PREFILLED_SYRINGE | INTRAVENOUS | Status: DC | PRN
  Administered 2020-04-15: 15 mg via INTRAVENOUS

## 2020-04-15 MED ORDER — ROCURONIUM BROMIDE 10 MG/ML (PF) SYRINGE
PREFILLED_SYRINGE | INTRAVENOUS | Status: DC | PRN
  Administered 2020-04-15: 10 mg via INTRAVENOUS
  Administered 2020-04-15: 30 mg via INTRAVENOUS

## 2020-04-15 MED ORDER — DEXAMETHASONE SODIUM PHOSPHATE 10 MG/ML IJ SOLN
INTRAMUSCULAR | Status: DC | PRN
  Administered 2020-04-15: 5 mg via INTRAVENOUS

## 2020-04-15 MED ORDER — DEXMEDETOMIDINE (PRECEDEX) IN NS 20 MCG/5ML (4 MCG/ML) IV SYRINGE
PREFILLED_SYRINGE | INTRAVENOUS | Status: DC | PRN
  Administered 2020-04-15 (×3): 4 ug via INTRAVENOUS

## 2020-04-15 MED ORDER — FENTANYL CITRATE (PF) 250 MCG/5ML IJ SOLN
INTRAMUSCULAR | Status: DC | PRN
  Administered 2020-04-15: 100 ug via INTRAVENOUS

## 2020-04-15 MED ORDER — GADOBUTROL 1 MMOL/ML IV SOLN
9.0000 mL | Freq: Once | INTRAVENOUS | Status: AC | PRN
  Administered 2020-04-15: 9 mL via INTRAVENOUS

## 2020-04-15 MED ORDER — SCOPOLAMINE 1 MG/3DAYS TD PT72
1.0000 | MEDICATED_PATCH | TRANSDERMAL | Status: DC
  Filled 2020-04-15: qty 1

## 2020-04-15 MED ORDER — LORAZEPAM 2 MG/ML IJ SOLN
1.0000 mg | INTRAMUSCULAR | Status: DC | PRN
  Administered 2020-04-15 – 2020-04-18 (×7): 1 mg via INTRAVENOUS
  Filled 2020-04-15 (×8): qty 1

## 2020-04-15 MED ORDER — LIDOCAINE 2% (20 MG/ML) 5 ML SYRINGE
INTRAMUSCULAR | Status: DC | PRN
  Administered 2020-04-15: 80 mg via INTRAVENOUS

## 2020-04-15 MED ORDER — INSULIN ASPART 100 UNIT/ML ~~LOC~~ SOLN
0.0000 [IU] | SUBCUTANEOUS | Status: DC
  Administered 2020-04-15 – 2020-04-16 (×2): 2 [IU] via SUBCUTANEOUS
  Administered 2020-04-16 (×2): 1 [IU] via SUBCUTANEOUS

## 2020-04-15 MED ORDER — ONDANSETRON HCL 4 MG/2ML IJ SOLN
INTRAMUSCULAR | Status: DC | PRN
  Administered 2020-04-15: 4 mg via INTRAVENOUS

## 2020-04-15 MED ORDER — ACETAMINOPHEN 500 MG PO TABS: 1000.0000 mg | ORAL_TABLET | Freq: Three times a day (TID) | ORAL | Status: DC

## 2020-04-15 MED ORDER — CHLORHEXIDINE GLUCONATE 0.12 % MT SOLN
15.0000 mL | Freq: Once | OROMUCOSAL | Status: AC
  Administered 2020-04-15: 15 mL via OROMUCOSAL

## 2020-04-15 MED ORDER — METOPROLOL TARTRATE 5 MG/5ML IV SOLN
2.5000 mg | Freq: Three times a day (TID) | INTRAVENOUS | Status: DC
  Administered 2020-04-15 – 2020-04-16 (×3): 2.5 mg via INTRAVENOUS
  Filled 2020-04-15 (×3): qty 5

## 2020-04-15 NOTE — Anesthesia Procedure Notes (Addendum)
Procedure Name: Intubation Date/Time: 04/04/2020 8:33 AM Performed by: Janace Litten, CRNA Pre-anesthesia Checklist: Patient identified, Emergency Drugs available, Suction available and Patient being monitored Patient Re-evaluated:Patient Re-evaluated prior to induction Oxygen Delivery Method: Circle System Utilized Preoxygenation: Pre-oxygenation with 100% oxygen Induction Type: IV induction Ventilation: Mask ventilation without difficulty Laryngoscope Size: Mac and 4 Grade View: Grade III Tube type: Oral Tube size: 7.0 mm Number of attempts: 1 Airway Equipment and Method: Stylet and Oral airway Placement Confirmation: ETT inserted through vocal cords under direct vision,  positive ETCO2 and breath sounds checked- equal and bilateral Secured at: 21 cm Tube secured with: Tape Dental Injury: Teeth and Oropharynx as per pre-operative assessment  Comments: MAC 4, grade III view- likely due to poor positioning in MRI head cradle, patient is NOT considered a difficult airway

## 2020-04-15 NOTE — Anesthesia Postprocedure Evaluation (Signed)
Anesthesia Post Note  Patient: Alan [name]  Procedure(s) Performed: MRI WITH ANESTHESIA OF BRAIN WITH AND WITHOUT CONTRAST (N/A )     Patient location during evaluation: PACU Anesthesia Type: General Level of consciousness: awake and alert Pain management: pain level controlled Vital Signs Assessment: post-procedure vital signs reviewed and stable Respiratory status: spontaneous breathing, nonlabored ventilation, respiratory function stable and patient connected to nasal cannula oxygen Cardiovascular status: blood pressure returned to baseline and stable Postop Assessment: no apparent nausea or vomiting Anesthetic complications: no   No complications documented.  Last Vitals:  Vitals:   04/14/2020 1000 04/06/2020 1010  BP: (!) 148/86 (!) 141/77  Pulse: 94 97  Resp: 20 (!) 24  Temp:  36.9 C  SpO2: 97% 96%    Last Pain:  Vitals:   04/14/2020 0945  TempSrc:   PainSc: Asleep                 Prajna Vanderpool,W. EDMOND

## 2020-04-15 NOTE — Transfer of Care (Signed)
Immediate Anesthesia Transfer of Care Note  Patient: Alan [name]  Procedure(s) Performed: MRI WITH ANESTHESIA OF BRAIN WITH AND WITHOUT CONTRAST (N/A )  Patient Location: PACU  Anesthesia Type:General  Level of Consciousness: drowsy, patient cooperative and responds to stimulation  Airway & Oxygen Therapy: Patient Spontanous Breathing and Patient connected to nasal cannula oxygen  Post-op Assessment: Report given to RN and Post -op Vital signs reviewed and stable  Post vital signs: Reviewed and stable  Last Vitals:  Vitals Value Taken Time  BP 141/63 03/29/2020 0930  Temp    Pulse 94 04/16/2020 0933  Resp 25 04/23/2020 0933  SpO2 97 % 04/20/2020 0933  Vitals shown include unvalidated device data.  Last Pain:  Vitals:   04/02/2020 0500  TempSrc: Oral  PainSc:          Complications: No complications documented.

## 2020-04-15 NOTE — Progress Notes (Signed)
OT Cancellation Note  Patient Details Name: Jshawn Hurta MRN: [id number] DOB: [birth year]   Cancelled Treatment:    Reason Eval/Treat Not Completed: Patient at procedure or test/ unavailable (Pt was at MRI for most of afternoon. OT to continue to follow for OT eval.)   Flora Lipps, OTR/L Acute Rehabilitation Services Pager: [phone] Office: [phone]   Maleta Pacha C 04/24/2020, 5:24 PM

## 2020-04-15 NOTE — Progress Notes (Signed)
Patient is scheduled for MRI this morning, was NPO all night.

## 2020-04-15 NOTE — Progress Notes (Signed)
Patient was noted with gugly breath sounds, on call MD was notified, obtained verbal PRN suction orders and a scopolamine order for secretions.

## 2020-04-15 NOTE — Consult Note (Signed)
Consultation Note Date: 04/21/2020   Patient Name: Alan [name]  DOB: [birth year]  MRN: [id number]  Age / Sex: 79 y.o., male  PCP: [facility] Referring Physician: Dorcas Carrow, MD  Reason for Consultation: Establishing goals of care  HPI/Patient Profile: 79 y.o. male  with past medical history of PTSD, insomnia, NICM, CHF (EF 40 - 45%), frequent falls, SAH in Jan 2021, Aifb, AAA (4.9 cm) extreme claustrophobia, and presbyesophagus  who was admitted on 04/23/2020 with delirium and extreme agitation.  Both neurology and psychiatry are following.  MR brain was completed this morning.  Thus far no reversible causes of delirium have been found.   Clinical Assessment and Goals of Care:  I have reviewed medical records including EPIC notes, labs and imaging, received report from Dr. Noel [name] and spoke on the phone with his daughter and surrogate decision maker Alan [name]  to discuss diagnosis prognosis, GOC, EOL wishes, disposition and options.  I introduced Palliative Medicine as specialized medical care for people living with serious illness. It focuses on providing relief from the symptoms and stress of a serious illness.   We discussed a brief life review of the patient.  Alan [name] has had quite a distinguished career.  He served in combat in [place]. 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  He went back into the [employer] and served in the [employer].  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  Per his daughter Alan [name] he has never talked about any of it.  He lives at home with his care taker and S/O Alan [name].    Alan [name] explains that difficulty seemed to start after the [facility] in January.  He had great difficulty sleeping and slept maybe two hours per night.  He  walked with a walked but continued to have difficulty falling.  The last two months have been very difficult he has been having increasing visual and auditory hallucinations and decreased his PO intake significantly.  Last Friday Alan [name] took him for a follow up to his outpatient Neurologist Alan [name]) and he had a full blown panic attack.  The neurologist described it as extreme agitation.    As far as functional and nutritional status in the [name] he is a 2+ max assist for even bed mobility.  He is unsafe to sit on the EOB. He has eaten very little.  He was found to have a T11 compression fracture and 11th rib fracture which are likely causing him pain.  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  Medications they have tried in the past for his symptoms made him feel much worse.  Alan [name] explains that her father is a very independent man and a very proud man.  He does not want to be dependent.  He has commented to her that he wished he had died when he  had the fall and SAH.  She mentions that he has difficulties with excess phlegm and difficulty breathing when he lays down - this causes him to panic.  I talked with Alan [name] about options.  If he eats and we can ameliorate his symptoms he may be a candidate for SNF rehab.  If we can not improve his symptoms and he will not eat he is a candidate for hospice house.  I went so far as to talk with Alan [name] about palliative sedation if his symptoms demanded it.  Alan [name] felt strongly that she does not want her father to suffer.  She does not want to lose him, but if we are truly unable to improve his situation she would be agreeable to palliative sedation.    We agreed to allow the medicine team to complete their work up.  Get the MRI results and be in contact again soon.  Questions and concerns were addressed.  The family was encouraged to call with questions or concerns.     Primary Decision Maker:  NEXT OF KIN daughter Alan [name] speaks for his children.    SUMMARY OF RECOMMENDATIONS    PMT will follow with you.  I have asked Alan [name] to evaluate the patient tomorrow after the effects of anesthesia have worn off.  Per daughter he panics when he lies flat.  Is he having any symptoms of heart failure?  If improvement in symptoms hopeful for SNF with Palliative.  If no improvement he is likely appropriate for Hospice House.   Code Status/Advance Care Planning:  DNR   Symptom Management:   Currently Neuro and Psych are following  Will exchange scopolamine patch for Robinul as Robinul will not cross the blood brain barrier.  Will add aricept as evidence indicates it can help with hallucinations in LBD patients.    If there is any evidence of CHF may add back torsemide.  Agree with ativan 1 mg PRN agitation  Tylenol scheduled for T11 and rib fracture  Will add low dose oxy IR PRN as well.  Add senna to maintain bowel movements.  Monitor for urinary retention.  Palliative Prophylaxis:   Delirium Protocol    Psycho-social/Spiritual:   Desire for further Chaplaincy support: Not disussed.  Prognosis:  Very concerned if he is not mobile, not eating and has refractory delirium, prognosis will be short.    Discharge Planning: To Be Determined      Primary Diagnoses: Present on Admission: . Essential hypertension . Hyperlipidemia . Insomnia . Uncontrolled type 2 diabetes mellitus with hyperglycemia, without long-term current use of insulin (HCC) . Chronic systolic CHF (congestive heart failure) (HCC) . Delirium   I have reviewed the medical record, interviewed the patient and family, and examined the patient. The following aspects are pertinent.  Past Medical History:  Diagnosis Date  . A-fib (HCC)   . AAA (abdominal aortic aneurysm) (HCC) 2018  . Arthritis   . CHF (congestive heart failure) (HCC)   . Chronic systolic  (congestive) heart failure (HCC)   . Chronic systolic CHF (congestive heart failure) (HCC)   . Claustrophobia    EXTREME   . Diabetes mellitus ORAL MED  . Diverticulosis   . ED (erectile dysfunction)   . GAD (generalized anxiety disorder)   . GERD (gastroesophageal reflux disease)   . Glaucoma of right eye   . History of diverticulitis   . History of nuclear stress test    MV 1/18: EF 33, no ischemia; high risk  . History  of prostate cancer   . Lung nodules   . Malfunction of artificial urethral sphincter (HCC)   . Malfunction of penile prosthesis (HCC)   . NICM (nonischemic cardiomyopathy) (HCC) 02/06/2015   LHC 11/14: EF 35, no significant CAD // Echo 7/16: Diff HK worse in inf base, EF 40%, Gr 1 DD, mild AI, mild MR, mod LAE // Echo 5/18: Moderate concentric LVH, EF 35-40, inferolateral and anterolateral AK, normal wall motion, moderate AI, moderate MR, moderate LAE, mild PI  . Nocturia   . Presbyesophagus 2018  . Snoring   . SUI (stress urinary incontinence), male   . Thoracic aortic aneurysm without rupture (HCC) 02/06/2015   Chest CTA 1/18:  5.1 cm ascending aortic aneurysm, mild cardiomegaly, mild coronary artery calcification, upper lobe small airway disease, cholelithiasis, 16 mm right thyroid nodule  . Thyroid nodule    Social History   Socioeconomic History  . Marital status: Divorced    Spouse name: Not on file  . Number of children: Not on file  . Years of education: Not on file  . Highest education level: Not on file  Occupational History  . Not on file  Tobacco Use  . Smoking status: Never Smoker  . Smokeless tobacco: Never Used  Vaping Use  . Vaping Use: Never used  Substance and Sexual Activity  . Alcohol use: No  . Drug use: No  . Sexual activity: Not on file  Other Topics Concern  . Not on file  Social History Narrative  . Not on file   Social Determinants of Health   Financial Resource Strain: Not on file  Food Insecurity: Not on file   Transportation Needs: Not on file  Physical Activity: Not on file  Stress: Not on file  Social Connections: Not on file   Family History  Problem Relation Age of Onset  . Heart Problems Mother   . Heart attack Father   . Healthy Sister        NO HEART ISSUES  . Healthy Sister        NO HEART ISSUES    Allergies  Allergen Reactions  . Ezetimibe Other (See Comments)    Confusion  . Statins Other (See Comments)    Myalgias  . Levaquin [Levofloxacin]     Can cause problem with aneurism, use PCN or Keflex   . Lacosamide Rash  . Other     Pt is extremely claustrophobic, will have a severe panic attack if he is left alone in a small space including an empty [name] room. Especially after waking up from surgery.   . Pravastatin Palpitations  . Ropinirole     Nightmares      Vital Signs: BP (!) 141/77 (BP Location: Right Arm)   Pulse 97   Temp 98.5 F (36.9 C)   Resp (!) 24   Ht 5\' 10"  (1.778 m)   Wt 98.9 kg   SpO2 96%   BMI 31.29 kg/m  Pain Scale: PAINAD   Pain Score: Asleep   SpO2: SpO2: 96 % O2 Device:SpO2: 96 % O2 Flow Rate: .O2 Flow Rate (L/min): 2 L/min    Palliative Assessment/Data:  20%     Time In: 11:30 Time Out: 12:40 Time Total: 70 min. Visit consisted of counseling and education dealing with the complex and emotionally intense issues surrounding the need for palliative care and symptom management in the setting of serious and potentially life-threatening illness. Greater than 50%  of this time was spent counseling and  coordinating care related to the above assessment and plan.  Signed by: Florentina Jenny, PA-C Palliative Medicine  Please contact Palliative Medicine Team phone at [phone] for questions and concerns.  For individual provider: See Shea Evans

## 2020-04-15 NOTE — Progress Notes (Signed)
PROGRESS NOTE    Alan [name]  ANV:[id number] DOB: 03-13-1941 DOA: 04/18/2020 PCP: [facility]    Brief Narrative:  79 year old gentleman with history of type 2 diabetes on insulin, PTSD, insomnia, hyperlipidemia, nonischemic cardiomyopathy, hypertension and GERD brought to the hospital by his caregiver with worsening mental status, delirium and agitation.  Patient is a [place] veteran, history of severe insomnia and PTSD.  He lives at home with his caretaker who noticed worsening symptoms for about 1 week.  Has been falling at home, impulsive and delirious. Admitted to hospital for neuro work-up, psychiatric symptom stabilization.   Assessment & Plan:   Principal Problem:   Delirium Active Problems:   NICM (nonischemic cardiomyopathy) (HCC)   Hyperlipidemia   Insomnia   Chronic systolic CHF (congestive heart failure) (HCC)   Uncontrolled type 2 diabetes mellitus with hyperglycemia, without long-term current use of insulin (HCC)   Essential hypertension  Delirium/acute metabolic encephalopathy in the setting of underlying multiple psychiatric issues including PTSD and insomnia: Rapidly progressive neurological symptoms. Belligerent, multiple falls, refusing to medical care and not sleeping,  Unsafe to stay home. Infection and metabolic work-up negative so far. Seen by neurology and suspecting Lewy body dementia. MRI with and without contrast today with anesthesia done today, shows no acute findings.  Or evidence of subarachnoid hemorrhage. Symptomatic management, currently on as needed Ativan for symptom control. Seen by psychiatry.   Followed by neurology.  Chronic A. fib: Rate controlled.  On beta-blockers.  Not anticoagulation candidate.  Currently unable to take any oral medications.  Multiple falls and deconditioning: All-time fall precautions delirium precautions.  Will consult PT OT to see what level of care he will need.  Coronary artery  disease/nonischemic cardiomyopathy: Euvolemic.  Currently on maintenance IV fluid as he is not eating and drinking.  Type 2 diabetes on insulin at home: Continued on lower doses to avoid hypoglycemia.  Advance care planning /difficult to treat condition:  Overall poor prognosis with ongoing psychiatric symptoms, poor quality of life. Now patient with extreme symptoms, unable to eat meaningful. Discussed with patient's caretaker and significant other as well as patient's daughter on the phone. We will continue attempt to control symptoms. We will try different medications to keep him comfortable. Will refer to palliative and potential hospice. He may qualify for inpatient hospice. He is very uncomfortable today, will use Ativan to keep him comfortable and sedated.   DVT prophylaxis: enoxaparin (LOVENOX) injection 40 mg Start: 04/12/20 1000 SCDs Start: 04/09/2020 2325   Code Status: DNR Family Communication: Sherol is patient's caretaker and significant other.   Patient's daughter [name] on the phone.   Disposition Plan: Status is: Inpatient  Remains inpatient appropriate because:Ongoing diagnostic testing needed not appropriate for outpatient work up, Unsafe d/c plan and Inpatient level of care appropriate due to severity of illness   Dispo: The patient is from: Home              Anticipated d/c is to: SNF versus inpatient hospice              Anticipated d/c date is: > 3 days              Patient currently is not medically stable to d/c.         Consultants:   Neurology  Psychiatry  Palliative  Procedures:   None  Antimicrobials:   None   Subjective: Patient seen and examined.  Came back from MRI.  Was sleepy.  Then he started  incomprehensible yelling and making noises.  He looked very uncomfortable on his stimulation.  Unable to have any conversation.  Objective: Vitals:   04/23/2020 0945 04/05/2020 1000 04/24/2020 1010 04/02/2020 1407  BP: 135/66 (!) 148/86 (!)  141/77 (!) 138/123  Pulse: 94 94 97   Resp: 19 20 (!) 24 (!) 28  Temp:   98.5 F (36.9 C) 99.7 F (37.6 C)  TempSrc:      SpO2: 97% 97% 96%   Weight:      Height:        Intake/Output Summary (Last 24 hours) at 04/10/2020 1453 Last data filed at 04/02/2020 0930 Gross per 24 hour  Intake 552.5 ml  Output 750 ml  Net -197.5 ml   Filed Weights   04/07/2020 1106  Weight: 98.9 kg    Examination:  General exam: Appears restless, delirious, agitated.  Does not open eyes. Respiratory system: Clear to auscultation. Respiratory effort normal. Cardiovascular system: S1 & S2 heard, RRR. No JVD, murmurs, rubs, gallops or clicks. No pedal edema. Gastrointestinal system: Abdomen is nondistended, soft and nontender. No organomegaly or masses felt. Normal bowel sounds heard. Central nervous system: No focal neurological deficits.  Thrashes all over with all extremities. Extremities: Symmetric 5 x 5 power.     Data Reviewed: I have personally reviewed following labs and imaging studies  CBC: Recent Labs  Lab 04/05/2020 1129 04/01/2020 2325 04/12/20 0540  WBC 6.4 7.8 8.8  NEUTROABS 3.9  --   --   HGB 11.5* 12.4* 12.1*  HCT 36.0* 38.5* 37.5*  MCV 82.9 82.6 83.0  PLT 198 215 205   Basic Metabolic Panel: Recent Labs  Lab 04/09/2020 1129 04/07/2020 2209 04/24/2020 2325 04/12/20 0540 04/13/20 0500  NA 136  --   --  141 140  K 4.6  --   --  4.9 4.7  CL 103  --   --  106 108  CO2 24  --   --  24 19*  GLUCOSE 210*  --   --  144* 137*  BUN 21  --   --  16 21  CREATININE 1.18  --  1.03 1.04 1.15  CALCIUM 9.1  --   --  9.5 9.2  MG  --  1.8  --   --   --   PHOS  --  3.4  --   --   --    GFR: Estimated Creatinine Clearance: 62.4 mL/min (by C-G formula based on SCr of 1.15 mg/dL). Liver Function Tests: Recent Labs  Lab 04/12/2020 2209 04/12/20 0540  AST 20 22  ALT 20 18  ALKPHOS 90 86  BILITOT 0.8 0.8  PROT 7.2 6.9  ALBUMIN 3.9 3.7   No results for input(s): LIPASE, AMYLASE in  the last 168 hours. Recent Labs  Lab 04/17/2020 2208  AMMONIA 21   Coagulation Profile: No results for input(s): INR, PROTIME in the last 168 hours. Cardiac Enzymes: No results for input(s): CKTOTAL, CKMB, CKMBINDEX, TROPONINI in the last 168 hours. BNP (last 3 results) Recent Labs    04/18/19 1510  PROBNP 975*   HbA1C: No results for input(s): HGBA1C in the last 72 hours. CBG: Recent Labs  Lab 04/14/20 1855 04/14/20 2050 04/06/2020 0722 04/08/2020 0936 04/13/2020 1253  GLUCAP 117* 144* 146* 146* 161*   Lipid Profile: No results for input(s): CHOL, HDL, LDLCALC, TRIG, CHOLHDL, LDLDIRECT in the last 72 hours. Thyroid Function Tests: Recent Labs    04/14/20 0143  FREET4 1.30*  T3FREE 2.4  Anemia Panel: Recent Labs    04/14/20 0143  VITAMINB12 693   Sepsis Labs: No results for input(s): PROCALCITON, LATICACIDVEN in the last 168 hours.  Recent Results (from the past 240 hour(s))  Resp Panel by RT-PCR (Flu A&B, Covid) Nasopharyngeal Swab     Status: None   Collection Time: 04/08/2020  9:07 PM   Specimen: Nasopharyngeal Swab; Nasopharyngeal(NP) swabs in vial transport medium  Result Value Ref Range Status   SARS Coronavirus 2 by RT PCR NEGATIVE NEGATIVE Final    Comment: (NOTE) SARS-CoV-2 target nucleic acids are NOT DETECTED.  The SARS-CoV-2 RNA is generally detectable in upper respiratory specimens during the acute phase of infection. The lowest concentration of SARS-CoV-2 viral copies this assay can detect is 138 copies/mL. A negative result does not preclude SARS-Cov-2 infection and should not be used as the sole basis for treatment or other patient management decisions. A negative result may occur with  improper specimen collection/handling, submission of specimen other than nasopharyngeal swab, presence of viral mutation(s) within the areas targeted by this assay, and inadequate number of viral copies(<138 copies/mL). A negative result must be combined  with clinical observations, patient history, and epidemiological information. The expected result is Negative.  Fact Sheet for Patients:  BloggerCourse.com  Fact Sheet for Healthcare Providers:  SeriousBroker.it  This test is no t yet approved or cleared by the [place] FDA and  has been authorized for detection and/or diagnosis of SARS-CoV-2 by FDA under an Emergency Use Authorization (EUA). This EUA will remain  in effect (meaning this test can be used) for the duration of the COVID-19 declaration under Section 564(b)(1) of the Act, 21 U.S.C.section 360bbb-3(b)(1), unless the authorization is terminated  or revoked sooner.       Influenza A by PCR NEGATIVE NEGATIVE Final   Influenza B by PCR NEGATIVE NEGATIVE Final    Comment: (NOTE) The Xpert Xpress SARS-CoV-2/FLU/RSV plus assay is intended as an aid in the diagnosis of influenza from Nasopharyngeal swab specimens and should not be used as a sole basis for treatment. Nasal washings and aspirates are unacceptable for Xpert Xpress SARS-CoV-2/FLU/RSV testing.  Fact Sheet for Patients: BloggerCourse.com  Fact Sheet for Healthcare Providers: SeriousBroker.it  This test is not yet approved or cleared by the [place] FDA and has been authorized for detection and/or diagnosis of SARS-CoV-2 by FDA under an Emergency Use Authorization (EUA). This EUA will remain in effect (meaning this test can be used) for the duration of the COVID-19 declaration under Section 564(b)(1) of the Act, 21 U.S.C. section 360bbb-3(b)(1), unless the authorization is terminated or revoked.  Performed at [facility], [address] [place], [place] [address]          Radiology Studies: MR BRAIN W WO CONTRAST  Result Date: 04/09/2020 CLINICAL DATA:  Mental status change, unknown cause. Multiple recent falls. EXAM:  MRI HEAD WITHOUT AND WITH CONTRAST TECHNIQUE: Multiplanar, multiecho pulse sequences of the brain and surrounding structures were obtained without and with intravenous contrast. CONTRAST:  32mL GADAVIST GADOBUTROL 1 MMOL/ML IV SOLN COMPARISON:  Head CT 03/31/2020.  CT angiogram head/neck 08/29/2012. FINDINGS: Brain: Mild cerebral atrophy. Mild periventricular and scattered T2/FLAIR hyperintensity within the cerebral white matter is nonspecific, but compatible with chronic small vessel ischemic disease. Scattered SWI signal loss along the bilateral frontal, parietal, occipital and temporal lobes compatible with chronic blood products and sequela of remote subarachnoid hemorrhage. Nonspecific chronic microhemorrhage within the left cerebellar hemisphere (series 8, image 10). There is  no acute infarct. No evidence of intracranial mass. No extra-axial fluid collection. No midline shift. No abnormal intracranial enhancement. Vascular: Expected proximal arterial flow voids. Skull and upper cervical spine: No focal marrow lesion. Sinuses/Orbits: Visualized orbits show no acute finding. Mild paranasal sinus mucosal thickening, most notably ethmoidal. Small mucous retention cysts within the right sphenoid and left maxillary sinuses. IMPRESSION: No evidence of acute intracranial abnormality. Scattered chronic blood products along the bilateral cerebral hemispheres compatible with sequela of remote subarachnoid hemorrhage. Mild cerebral atrophy and chronic small vessel ischemic disease. Paranasal sinus disease as described. Electronically Signed   By: Jackey Loge DO   On: 04/24/2020 10:26        Scheduled Meds: . acetaminophen  1,000 mg Oral Q8H  . aspirin EC  81 mg Oral Daily  . donepezil  5 mg Oral QHS  . DULoxetine  30 mg Oral Daily  . enoxaparin (LOVENOX) injection  40 mg Subcutaneous Q24H  . feeding supplement  237 mL Oral BID BM  . insulin aspart  0-5 Units Subcutaneous QHS  . insulin aspart  0-9 Units  Subcutaneous TID WC  . mouth rinse  15 mL Mouth Rinse BID  . metoprolol succinate  12.5 mg Oral BID  . rosuvastatin  5 mg Oral QHS  . senna  1 tablet Oral QHS   Continuous Infusions: . sodium chloride    . valproate sodium 250 mg (04/14/20 2252)     LOS: 4 days    Time spent: 35 minutes    Dorcas Carrow, MD [facility] Pager [phone]

## 2020-04-15 NOTE — Plan of Care (Signed)

## 2020-04-15 NOTE — Treatment Plan (Signed)
Treatment Plan  MRI done under anesthesia, shows only old (known) hemorrhage from prior traumatic SAH:  "No evidence of acute intracranial abnormality.  Scattered chronic blood products along the bilateral cerebral hemispheres compatible with sequela of remote subarachnoid hemorrhage.  Mild cerebral atrophy and chronic small vessel ischemic disease.  Paranasal sinus disease as described."  Will reassess tomorrow after anesthesia effects resolve, and see if there is anything else we can offer.  Meredeth Ide, MD

## 2020-04-16 ENCOUNTER — Encounter (HOSPITAL_COMMUNITY): Payer: Self-pay | Admitting: Radiology

## 2020-04-16 ENCOUNTER — Inpatient Hospital Stay (HOSPITAL_COMMUNITY): Payer: Medicare Other

## 2020-04-16 DIAGNOSIS — E1165 Type 2 diabetes mellitus with hyperglycemia: Secondary | ICD-10-CM

## 2020-04-16 DIAGNOSIS — I428 Other cardiomyopathies: Secondary | ICD-10-CM

## 2020-04-16 DIAGNOSIS — Z515 Encounter for palliative care: Secondary | ICD-10-CM

## 2020-04-16 DIAGNOSIS — G4700 Insomnia, unspecified: Secondary | ICD-10-CM

## 2020-04-16 DIAGNOSIS — E785 Hyperlipidemia, unspecified: Secondary | ICD-10-CM

## 2020-04-16 DIAGNOSIS — F0280 Dementia in other diseases classified elsewhere without behavioral disturbance: Secondary | ICD-10-CM

## 2020-04-16 DIAGNOSIS — S22000A Wedge compression fracture of unspecified thoracic vertebra, initial encounter for closed fracture: Secondary | ICD-10-CM | POA: Diagnosis not present

## 2020-04-16 LAB — CBC WITH DIFFERENTIAL/PLATELET
Abs Immature Granulocytes: 0.04 10*3/uL (ref 0.00–0.07)
Basophils Absolute: 0 10*3/uL (ref 0.0–0.1)
Basophils Relative: 0 %
Eosinophils Absolute: 0 10*3/uL (ref 0.0–0.5)
Eosinophils Relative: 1 %
HCT: 40 % (ref 39.0–52.0)
Hemoglobin: 11.9 g/dL — ABNORMAL LOW (ref 13.0–17.0)
Immature Granulocytes: 1 %
Lymphocytes Relative: 16 %
Lymphs Abs: 1.4 10*3/uL (ref 0.7–4.0)
MCH: 25.9 pg — ABNORMAL LOW (ref 26.0–34.0)
MCHC: 29.8 g/dL — ABNORMAL LOW (ref 30.0–36.0)
MCV: 87 fL (ref 80.0–100.0)
Monocytes Absolute: 0.9 10*3/uL (ref 0.1–1.0)
Monocytes Relative: 10 %
Neutro Abs: 6.2 10*3/uL (ref 1.7–7.7)
Neutrophils Relative %: 72 %
Platelets: 252 10*3/uL (ref 150–400)
RBC: 4.6 MIL/uL (ref 4.22–5.81)
RDW: 16.4 % — ABNORMAL HIGH (ref 11.5–15.5)
WBC: 8.6 10*3/uL (ref 4.0–10.5)
nRBC: 0 % (ref 0.0–0.2)

## 2020-04-16 LAB — COMPREHENSIVE METABOLIC PANEL
ALT: 19 U/L (ref 0–44)
AST: 23 U/L (ref 15–41)
Albumin: 3 g/dL — ABNORMAL LOW (ref 3.5–5.0)
Alkaline Phosphatase: 78 U/L (ref 38–126)
Anion gap: 11 (ref 5–15)
BUN: 27 mg/dL — ABNORMAL HIGH (ref 8–23)
CO2: 23 mmol/L (ref 22–32)
Calcium: 9 mg/dL (ref 8.9–10.3)
Chloride: 116 mmol/L — ABNORMAL HIGH (ref 98–111)
Creatinine, Ser: 1.32 mg/dL — ABNORMAL HIGH (ref 0.61–1.24)
GFR, Estimated: 55 mL/min — ABNORMAL LOW (ref >60)
Glucose, Bld: 161 mg/dL — ABNORMAL HIGH (ref 70–99)
Potassium: 4.7 mmol/L (ref 3.5–5.1)
Sodium: 150 mmol/L — ABNORMAL HIGH (ref 135–145)
Total Bilirubin: 1.3 mg/dL — ABNORMAL HIGH (ref 0.3–1.2)
Total Protein: 6.1 g/dL — ABNORMAL LOW (ref 6.5–8.1)

## 2020-04-16 LAB — GLUCOSE, CAPILLARY
Glucose-Capillary: 127 mg/dL — ABNORMAL HIGH (ref 70–99)
Glucose-Capillary: 135 mg/dL — ABNORMAL HIGH (ref 70–99)
Glucose-Capillary: 144 mg/dL — ABNORMAL HIGH (ref 70–99)
Glucose-Capillary: 149 mg/dL — ABNORMAL HIGH (ref 70–99)
Glucose-Capillary: 161 mg/dL — ABNORMAL HIGH (ref 70–99)

## 2020-04-16 LAB — PHOSPHORUS
Phosphorus: 4.4 mg/dL (ref 2.5–4.6)
Phosphorus: 2.8 mg/dL (ref 2.5–4.6)

## 2020-04-16 LAB — MAGNESIUM: Magnesium: 2 mg/dL (ref 1.7–2.4)

## 2020-04-16 LAB — PROTIME-INR
INR: 1.3 — ABNORMAL HIGH (ref 0.8–1.2)
Prothrombin Time: 15.9 seconds — ABNORMAL HIGH (ref 11.4–15.2)

## 2020-04-16 LAB — APTT: aPTT: 34 seconds (ref 24–36)

## 2020-04-16 LAB — LACTIC ACID, PLASMA
Lactic Acid, Venous: 1.4 mmol/L (ref 0.5–1.9)
Lactic Acid, Venous: 1.4 mmol/L (ref 0.5–1.9)

## 2020-04-16 LAB — PROCALCITONIN: Procalcitonin: 0.11 ng/mL

## 2020-04-16 MED ORDER — IPRATROPIUM BROMIDE 0.02 % IN SOLN
RESPIRATORY_TRACT | Status: AC
  Administered 2020-04-16: 0.5 mg via RESPIRATORY_TRACT
  Filled 2020-04-16: qty 2.5

## 2020-04-16 MED ORDER — IPRATROPIUM BROMIDE 0.02 % IN SOLN
0.5000 mg | Freq: Four times a day (QID) | RESPIRATORY_TRACT | Status: DC
  Administered 2020-04-16 – 2020-04-19 (×10): 0.5 mg via RESPIRATORY_TRACT
  Filled 2020-04-16 (×10): qty 2.5

## 2020-04-16 MED ORDER — VANCOMYCIN HCL 2000 MG/400ML IV SOLN: 2000.0000 mg | Freq: Once | INTRAVENOUS | Status: DC

## 2020-04-16 MED ORDER — DEXTROSE 5 % IV SOLN: INTRAVENOUS | Status: DC

## 2020-04-16 MED ORDER — SCOPOLAMINE 1 MG/3DAYS TD PT72
1.0000 | MEDICATED_PATCH | TRANSDERMAL | Status: DC
  Administered 2020-04-16: 1.5 mg via TRANSDERMAL
  Filled 2020-04-16 (×2): qty 1

## 2020-04-16 MED ORDER — LEVALBUTEROL HCL 0.63 MG/3ML IN NEBU
0.6300 mg | INHALATION_SOLUTION | Freq: Four times a day (QID) | RESPIRATORY_TRACT | Status: DC
  Administered 2020-04-16 – 2020-04-19 (×10): 0.63 mg via RESPIRATORY_TRACT
  Filled 2020-04-16 (×10): qty 3

## 2020-04-16 MED ORDER — MIDAZOLAM 50MG/50ML (1MG/ML) PREMIX INFUSION: 2.0000 mg/h | INTRAVENOUS | Status: DC

## 2020-04-16 MED ORDER — FENTANYL CITRATE (PF) 100 MCG/2ML IJ SOLN
50.0000 ug | INTRAMUSCULAR | Status: DC | PRN
  Administered 2020-04-17 – 2020-04-18 (×3): 50 ug via INTRAVENOUS
  Filled 2020-04-16 (×3): qty 2

## 2020-04-16 MED ORDER — VANCOMYCIN HCL IN DEXTROSE 1-5 GM/200ML-% IV SOLN
1000.0000 mg | Freq: Once | INTRAVENOUS | Status: DC
  Administered 2020-04-16: 1000 mg via INTRAVENOUS
  Filled 2020-04-16: qty 200

## 2020-04-16 MED ORDER — SODIUM CHLORIDE 0.9 % IV SOLN
2.0000 g | Freq: Once | INTRAVENOUS | Status: DC
  Filled 2020-04-16: qty 2

## 2020-04-16 MED ORDER — LEVALBUTEROL HCL 0.63 MG/3ML IN NEBU
INHALATION_SOLUTION | RESPIRATORY_TRACT | Status: AC
  Administered 2020-04-16: 0.63 mg via RESPIRATORY_TRACT
  Filled 2020-04-16: qty 3

## 2020-04-16 NOTE — NC FL2 (Signed)
[place] MEDICAID FL2 LEVEL OF CARE SCREENING TOOL     IDENTIFICATION  Patient Name: Alan [name] Birthdate: 12-17-1940 Sex: male Admission Date (Current Location): 04-24-20  [facility] and [place] Number:  Producer, television/film/video and Address:  [facility], [address] [place], [place] [address]      Provider Number: [id number]  Attending Physician Name and Address:  Merlene Laughter, DO  Relative Name and Phone Number:  [name] [phone]    Current Level of Care: Hospital Recommended Level of Care: Skilled Nursing Facility Prior Approval Number:    Date Approved/Denied:   PASRR Number:    Discharge Plan: SNF    Current Diagnoses: Patient Active Problem List   Diagnosis Date Noted  . Delirium 04/24/20  . Essential hypertension 11/23/2016  . Chronic systolic CHF (congestive heart failure) (HCC) 09/11/2016  . Uncontrolled type 2 diabetes mellitus with hyperglycemia, without long-term current use of insulin (HCC) 09/11/2016  . Esophageal dysmotility 09/11/2016  . Thyroid nodule 09/08/2016  . Chest pressure 05/10/2016  . Insomnia 05/10/2016  . NICM (nonischemic cardiomyopathy) (HCC) 02/06/2015  . Thoracic aortic aneurysm without rupture (HCC) 02/06/2015  . Aortic insufficiency 02/06/2015  . Hyperlipidemia 02/06/2015  . 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 Malfunction of penile prosthesis (HCC) 09/24/2011    Orientation RESPIRATION BLADDER Height & Weight      (disoriented)  O2 Incontinent,External catheter Weight: 98.9 kg Height:  5\' 10"  (177.8 cm)  BEHAVIORAL SYMPTOMS/MOOD NEUROLOGICAL BOWEL NUTRITION STATUS  Other (Comment) (anxious/ restless)  (n/a) Incontinent  (currently NPO  due to severity of cognitive deficits)  AMBULATORY STATUS COMMUNICATION OF NEEDS Skin   Total Care (previously assist with walker/ due to cognitive status currently immobile) Does not communicate (incoherant speech) (S)  Skin abrasions (left knee foam dressing)                       Personal Care Assistance Level of Assistance  Bathing,Feeding,Dressing,Total care Bathing Assistance: Maximum assistance Feeding assistance: Maximum assistance Dressing Assistance: Maximum assistance Total Care Assistance: Maximum assistance   Functional Limitations Info  Sight,Hearing,Speech Sight Info: Adequate Hearing Info: Adequate Speech Info: Impaired    SPECIAL CARE FACTORS FREQUENCY  PT (By licensed PT),OT (By licensed OT),Speech therapy     PT Frequency: 5X OT Frequency: 5X     Speech Therapy Frequency: #X      Contractures Contractures Info: Not present    Additional Factors Info  Code Status,Allergies,Psychotropic,Insulin Sliding Scale,Isolation Precautions Code Status Info: DNR Allergies Info: Ezetimibe, Statins, Levaquin, Lacosamide, Other, Pravastatin, Ropinirole Psychotropic Info: Cymbalta daily/ Ativan PRN see d/c summary for medication list Insulin Sliding Scale Info: see discharge summary for sliding scale info Isolation Precautions Info: none     Current Medications (04/16/2020):  This is the current hospital active medication list Current Facility-Administered Medications  Medication Dose Route Frequency Provider Last Rate Last Admin  . 0.9 %  sodium chloride infusion   Intravenous Continuous 04/18/2020, MD 75 mL/hr at 04/10/2020 2201 New Bag at 04/14/2020 2201  . acetaminophen (TYLENOL) tablet 650 mg  650 mg Oral Q6H PRN 2202, MD       Or  . acetaminophen (TYLENOL) suppository 650 mg  650 mg Rectal Q6H PRN Rometta Emery, MD      . acetaminophen (TYLENOL) tablet 1,000 mg  1,000 mg Oral Q8H Dellinger, Marianne L, PA-C      . albuterol (PROVENTIL) (2.5 MG/3ML)  0.083% nebulizer solution 2.5 mg  2.5 mg Nebulization Q4H PRN Dorcas Carrow, MD   2.5 mg at 04/14/20 1521  . aspirin EC tablet 81 mg  81 mg Oral Daily Noralee Stain, DO      . enoxaparin (LOVENOX)  injection 40 mg  40 mg Subcutaneous Q24H Earlie Lou L, MD   40 mg at 04/16/20 1026  . feeding supplement (ENSURE ENLIVE / ENSURE PLUS) liquid 237 mL  237 mL Oral BID BM Zannie Cove, MD      . insulin aspart (novoLOG) injection 0-9 Units  0-9 Units Subcutaneous Q4H Eduard Clos, MD   1 Units at 04/16/20 [phone]  . ipratropium (ATROVENT) nebulizer solution 0.5 mg  0.5 mg Nebulization Q6H Sheikh, Omair Latif, DO      . levalbuterol [facility]) nebulizer solution 0.63 mg  0.63 mg Nebulization Q6H Sheikh, Omair Latif, DO   0.63 mg at 04/16/20 1051  . LORazepam (ATIVAN) injection 1 mg  1 mg Intravenous Q4H PRN Dorcas Carrow, MD   1 mg at 04/16/20 0509  . MEDLINE mouth rinse  15 mL Mouth Rinse BID Maretta Bees, MD   15 mL at 04/16/20 1027  . metoprolol tartrate (LOPRESSOR) injection 2.5 mg  2.5 mg Intravenous Q8H Eduard Clos, MD   2.5 mg at 04/16/20 0509  . metoprolol tartrate (LOPRESSOR) injection 5 mg  5 mg Intravenous Q6H PRN Rometta Emery, MD   5 mg at 04/14/20 2242  . senna (SENOKOT) tablet 8.6 mg  1 tablet Oral QHS Dellinger, Marianne L, PA-C      . valproate (DEPACON) 250 mg in dextrose 5 % 50 mL IVPB  250 mg Intravenous Q12H Kara Mead, NP 52.5 mL/hr at 04/16/20 1029 250 mg at 04/16/20 1029     Discharge Medications: Please see discharge summary for a list of discharge medications.  Relevant Imaging Results:  Relevant Lab Results:   Additional Information    Beckie Busing, RN

## 2020-04-16 NOTE — Evaluation (Signed)
Occupational Therapy Evaluation Patient Details Name: Alan [name] MRN: [id number] DOB: 03/30/1941 Today's Date: 04/16/2020    History of Present Illness Alan [name] is a 79 y.o. male with PMHx: diabetes, PTSD, insomnia, hyperlipidemia, systolic dysfunction CHF, nonischemic cardiomyopathy, hypertension and GERD among other things who was brought in by his caregiver due to worsening mental status.  He is completely delirious agitated.  He was originally at [facility] where he was seen for behavioral changes and frequent falls at home.  He has had fall in the past with subarachnoid hemorrhage which has resolved. Pt with New delirium, aggressive behavior, confusion and weakness   Clinical Impression   Pt PTA: Pt living with caregiver who is also his significant other. She reports pt that was requiring assist for ADL and mobility with RW. Pt currently,  totalA for ADL and bed mobility (rolling) most likely due to sedative required for restlessness. Pt not following any commands nor opening eyes. Pt with crackling and gurgling breathing. Pt positioned in bed for comfort with HOB elevated to 40* Pt bumped up to 4L O2 and O2 sats at 87%. RN called RT for breathing/suctioning. RT in room upon OTR leaving. Pt would benefit from continued OT skilled services for ADL, energy conservation, and positional movement. OT following acutely.    Follow Up Recommendations  SNF;Supervision/Assistance - 24 hour;Other (comment) (palliative care following)    Equipment Recommendations  3 in 1 bedside commode    Recommendations for Other Services       Precautions / Restrictions Precautions Precautions: Fall;Other (comment) Precaution Comments: wet gurgling breathing; watch O2; mitts on hands Restrictions Weight Bearing Restrictions: No      Mobility Bed Mobility Overal bed mobility: Needs Assistance Bed Mobility: Rolling Rolling: Total assist;+2 for physical assistance;+2 for  safety/equipment         General bed mobility comments: totalA +2 for rolling and for scooting up to [facility]. Assist from RN    Transfers                 General transfer comment: unable to arouse    Balance                                           ADL either performed or assessed with clinical judgement   ADL Overall ADL's : Needs assistance/impaired Eating/Feeding: Total assistance;NPO   Grooming: Total assistance   Upper Body Bathing: Total assistance   Lower Body Bathing: Total assistance   Upper Body Dressing : Total assistance   Lower Body Dressing: Total assistance;+2 for physical assistance;+2 for safety/equipment   Toilet Transfer: Total assistance;+2 for physical assistance;+2 for safety/equipment   Toileting- Clothing Manipulation and Hygiene: Total assistance;+2 for physical assistance;+2 for safety/equipment       Functional mobility during ADLs: Total assistance;+2 for physical assistance;+2 for safety/equipment (pt rolling side to side) General ADL Comments: Pt limited by decreased strength, decreased arousal, decreased ability to follow commands and decreased activity tolerance. Pt performing bed mobility with totalA today; pt totalA overall for ADL at this time     Vision Baseline Vision/History: No visual deficits Patient Visual Report: No change from baseline Additional Comments: unable to assess; pt did not open eyes throughout session     Perception     Praxis      Pertinent Vitals/Pain Pain Assessment: No/denies pain Faces Pain Scale: Hurts  little more Pain Location: generalized with movement Pain Descriptors / Indicators: Grimacing;Moaning Pain Intervention(s): Monitored during session;Repositioned     Hand Dominance Right   Extremity/Trunk Assessment Upper Extremity Assessment Upper Extremity Assessment: Generalized weakness;RUE deficits/detail;LUE deficits/detail RUE Deficits / Details: AAROM, WFLs RUE  Coordination: decreased fine motor;decreased gross motor LUE Deficits / Details: AAROm, WFLs LUE Coordination: decreased fine motor;decreased gross motor   Lower Extremity Assessment Lower Extremity Assessment: Generalized weakness;Defer to PT evaluation   Cervical / Trunk Assessment Cervical / Trunk Assessment: Kyphotic   Communication Communication Communication: Other (comment) (pt only moaned;)   Cognition Arousal/Alertness: Lethargic Behavior During Therapy: WFL for tasks assessed/performed Overall Cognitive Status: Impaired/Different from baseline                                 General Comments: Pt not able to be aroused to answer questions at this time   General Comments  Pt's girlfriend/caregiver is in the room, emotional about situation. Pt with gurgling sounds and O2 sats after rolling unable to recover >86% RN called RT and RT in room upon OTR leaving with Pt bumped to 4L O2 and O2 sats at 87% wioth HOB raised and feet elevated.    Exercises Exercises: Other exercises Other Exercises Other Exercises: PROM to AAROM shoulder through digits   Shoulder Instructions      Home Living Family/patient expects to be discharged to:: Private residence Living Arrangements: Non-relatives/Friends;Other (Comment) (girl friend is his caregiver) Available Help at Discharge: Personal care attendant;Available 24 hours/day Type of Home: House                       Home Equipment: Walker - standard;Cane - single point;Grab bars - tub/shower   Additional Comments: lives with caregiver      Prior Functioning/Environment Level of Independence: Needs assistance  Gait / Transfers Assistance Needed: falls with RW or trying to walk without ADL's / Homemaking Assistance Needed: assist with all ADL; cues for brushing teeth and feeding self            OT Problem List: Decreased strength;Decreased activity tolerance;Impaired balance (sitting and/or  standing);Decreased cognition;Decreased safety awareness;Impaired UE functional use;Pain;Increased edema;Cardiopulmonary status limiting activity      OT Treatment/Interventions: Therapeutic exercise;Self-care/ADL training;Energy conservation;Therapeutic activities;Visual/perceptual remediation/compensation;Patient/family education;Balance training;Cognitive remediation/compensation    OT Goals(Current goals can be found in the care plan section) Acute Rehab OT Goals Patient Stated Goal: to improve mental status OT Goal Formulation: Patient unable to participate in goal setting Time For Goal Achievement: 04/30/20 Potential to Achieve Goals: Fair ADL Goals Pt Will Perform Grooming: with min assist;bed level;sitting Pt Will Transfer to Toilet: with max assist;with +2 assist;squat pivot transfer Pt/caregiver will Perform Home Exercise Program: Increased strength;Both right and left upper extremity;With minimal assist Additional ADL Goal #1: Pt will participate in bed mobility with modA overall as pre-gait and pre-ADL tasks to increase independence Additional ADL Goal #2: Pt will arouse x5 mins to participate in ADL tasks with modA overall with moderate cues for sequencing.  OT Frequency: Min 2X/week   Barriers to D/C:            Co-evaluation              AM-PAC OT "6 Clicks" Daily Activity     Outcome Measure Help from another person eating meals?: Total Help from another person taking care of personal grooming?: Total Help from another person toileting,  which includes using toliet, bedpan, or urinal?: Total Help from another person bathing (including washing, rinsing, drying)?: Total Help from another person to put on and taking off regular upper body clothing?: Total Help from another person to put on and taking off regular lower body clothing?: Total 6 Click Score: 6   End of Session Equipment Utilized During Treatment: Oxygen Nurse Communication: Mobility status  Activity  Tolerance: Treatment limited secondary to medical complications (Comment);Treatment limited secondary to agitation (Pt given sedative to calm down and unable to arouse) Patient left: in bed;with call bell/phone within reach;with bed alarm set;with nursing/sitter in room;Other (comment) (in room with RT)  OT Visit Diagnosis: Unsteadiness on feet (R26.81);Muscle weakness (generalized) (M62.81)                Time: 0826-0900 OT Time Calculation (min): 34 min Charges:  OT General Charges $OT Visit: 1 Visit OT Evaluation $OT Eval Moderate Complexity: 1 Mod OT Treatments $Therapeutic Activity: 8-22 mins  Flora Lipps, OTR/L Acute Rehabilitation Services Pager: [phone] Office: [phone]   Andres Vest  C 04/16/2020, 9:50 AM

## 2020-04-16 NOTE — TOC Progression Note (Signed)
Transition of Care [facility]) - Progression Note    Patient Details  Name: Alan [name] MRN: [id number] Date of Birth: Sep 21, 1940  [facility] CM/SW Contact  Beckie Busing, RN Phone Number: [phone]  04/16/2020, 3:03 PM  Clinical Narrative:    FL2 complete. PASRR pending. CM spoke with Mallie Darting SW from [place]. Jasmine reports that daughter states that she would like to have patient placed in [place]. Jasmine also reports that patient is eligible for Long term Care facility although there are no long term care beds in [place]. SW to email [facility] screening paperwork for potential acceptance for long term care. Info to be completed and faxed.     Barriers to Discharge: No Barriers Identified  Expected Discharge Plan and Services                                                 Social Determinants of Health (SDOH) Interventions    Readmission Risk Interventions Readmission Risk Prevention Plan 04/16/2020  Post Dischage Appt Complete  Medication Screening Complete  Transportation Screening Complete  Some recent data might be hidden

## 2020-04-16 NOTE — Progress Notes (Signed)
PROGRESS NOTE    Alan [name]  WGN:[id number] DOB: 02/21/1941 DOA: 04/24/2020 PCP: [facility]   Brief Narrative:  The patient is a 79 year old elderly Caucasian male with a past medical history significant for but not limited to type 2 diabetes mellitus on insulin, PTSD, insomnia, hyperlipidemia, nonischemic cardiomyopathy, hypertension, GERD as well as other comorbidities who was brought to the hospital by his girlfriend caregiver with worsening mental status, delirium, agitation and recurrent falls.  Patient is a [place] veteran and has a history of severe insomnia and PTSD.  He has been living at home with his caretaker was noticed worsening symptoms for about a week.  He has been falling multiple times and has been very impulsive and delirious.  Is admitted to the hospital for more work-up and psychiatric symptomatic stabilization.  Patient failed to improve and after subsequent goals of care discussion he was transitioned to comfort care measures and is a very high risk for imminent decompensation and likely even possible hospital death.  He has been transitioned for end-of-life care and residential hospice referral has been made and it is not unexpected for the patient to further decompensate.  Patient spiked a temperature today but would not work this up further given that he is being transitioned to comfort care now.  Assessment & Plan:   Principal Problem:   Delirium Active Problems:   NICM (nonischemic cardiomyopathy) (HCC)   Hyperlipidemia   Insomnia   Chronic systolic CHF (congestive heart failure) (HCC)   Uncontrolled type 2 diabetes mellitus with hyperglycemia, without long-term current use of insulin (HCC)   Essential hypertension   Terminal care   End of life care  Delirium/acute metabolic encephalopathy in the setting of underlying multiple psychiatric issues including PTSD and insomnia: Rapidly progressive neurological symptoms. -He has been  belligerent, multiple falls, refusing to medical care and not sleeping,  -Unsafe to stay home. -Infection and metabolic work-up negative so far but he spiked a temperature yesterday and then spiked another temperature today and we will panculture -Seen by neurology and suspecting Lewy body dementia. -MRI with and without contrast today with anesthesia done today, shows no acute findings.  Or evidence of subarachnoid hemorrhage. -Symptomatic management, currently on as needed Ativan for symptom control. -Seen by psychiatry.   -Followed by neurology. -Palliative care was consulted for further evaluation and after further goals of care discussion patient was transitioned to comfort measures only and all medications were stopped letter not in the patient's comfort.  We will continue his acetaminophen 1000 mg every 8 hours if he is able to tolerate p.o., 650 mg p.o./RC every 6 hours as needed for mild pain, duo nebs 2.5 mg nebs every 4 hours as needed for wheezing, fentanyl 50 mcg IV nightly as needed for moderate pain, Atrovent 2.5 mg nebs every 6 hours scheduled, Xopenex 0.63 mg nebs scheduled every 6, Ativan 1 mg IV every 4 as needed anxiety, midazolam 2 mg/h IV continuous and valproic to 50 mg IV every 12  -Patient is at extremely high risk for further decompensation and referrals been made for hospice; if he does not go to residential hospice we will anticipate in-hospital death given his severity of illness and high risk for decompensation  Chronic A. fib:  -Rate controlled and has been tachycardic today.  On beta-blockers.  Not anticoagulation candidate.  Currently unable to take any oral medications given his encephalopathy -He will be made comfort care and shift of focus of been transitioning the patient to comfort  Multiple falls and deconditioning:  -All-time fall precautions delirium precautions.  Will consult PT OT to see what level of care he will need however he will no longer be followed  given that he is being transitioned to comfort care.  Coronary artery disease/nonischemic cardiomyopathy:  -Euvolemic.  Currently on maintenance IV fluid with normal saline as he is not eating and drinking but will transition to D5W given his hypernatremia; the D5W has now been stopped given the patient will transition to comfort  Hypernatremia -Patient is sodium went from 140 and today is 150 -Stop IV normal saline started D5W to help the sodium  SIRS without clear Evidence of Source -She spiked a temperature of 102.7, became tachypneic as well as tachycardic -He is started back on IV fluid hydration and pancultured and was to be initiated on antibiotics with IV vancomycin IV cefepime however after further goals of care discussion patient was transitioned to comfort care only and all his medications have been stopped that are not an line with the patient's comfort. -Fluids with D5W was initiated but stopped now that he is being transitioned to comfort care  Type 2 diabetes on insulin at home -Continued on lower doses to avoid hypoglycemia. -He was started on D5W but this has been stopped now that he is being transitioned to comfort care  Advance care planning /difficult to treat condition:  -Overall poor prognosis with ongoing psychiatric symptoms, poor quality of life. -Now patient with extreme symptoms, unable to eat meaningful. -My Colleague Discussed with patient's caretaker and significant other as well as patient's daughter on the phone. -We will continue attempt to control symptoms. -We will try different medications to keep him comfortable. -Will refer to palliative and potential hospice. -He may qualify for inpatient hospice. -He was very uncomfortable today yesterday, will use Ativan to keep him comfortable and sedated; appeared comfortable today on my examination -After further goals of care discussion palliative discussed with the daughter and patient will transition to  comfort care measures referral has been made for residential hospice  Obesity -Complicates overall prognosis and care -Estimated body mass index is 31.29 kg/m as calculated from the following:   Height as of this encounter: 5\' 10"  (1.778 m).   Weight as of this encounter: 98.9 kg. -Weight Loss and Dietary Counseling given   DVT prophylaxis: He is on enoxaparin 40 mg subcu we will stop this given that he is being transitioned Code Status: DO NOT RESUSCITATE Family Communication: Girlfriend was at bedside  Disposition Plan: Anticipate in-hospital death if he is not transferred to residential hospice; patient is a very high risk for further decompensation given his multiple comorbidities and now that he is being transitioned to comfort care imminent death is not unexpected  Status is: Inpatient  Remains inpatient appropriate because:Unsafe d/c plan, IV treatments appropriate due to intensity of illness or inability to take PO and Inpatient level of care appropriate due to severity of illness   Dispo: The patient is from: Home              Anticipated d/c is to: Residential Hospice              Anticipated d/c date is: 1-2 days              Patient currently is not medically stable to d/c.  Consultants:   Neurology  Palliative Care Medicine    Procedures: MRI  Antimicrobials:  Anti-infectives (From admission, onward)   Start  Dose/Rate Route Frequency Ordered Stop   04/16/20 1730  vancomycin (VANCOCIN) IVPB 1000 mg/200 mL premix  Status:  Discontinued        1,000 mg 200 mL/hr over 60 Minutes Intravenous  Once 04/16/20 1641 04/16/20 1734   04/16/20 1730  ceFEPIme (MAXIPIME) 2 g in sodium chloride 0.9 % 100 mL IVPB  Status:  Discontinued        2 g 200 mL/hr over 30 Minutes Intravenous  Once 04/16/20 1641 04/16/20 1734   04/16/20 1715  vancomycin (VANCOREADY) IVPB 2000 mg/400 mL  Status:  Discontinued        2,000 mg 200 mL/hr over 120 Minutes Intravenous  Once 04/16/20  1701 04/16/20 1702        Subjective: Seen and examined at bedside he is resting and did not provide a subjective history and was not awake enough to participate in examination.  He appeared calm this morning.  Caregiver and girlfriend at bedside and she has no questions.  Objective: Vitals:   04/16/20 1424 04/16/20 1510 04/16/20 1513 04/16/20 1549  BP: (!) 150/90   (!) 143/61  Pulse: 100   66  Resp: (!) 22   (!) 27  Temp: 98.9 F (37.2 C)   (!) 102.7 F (39.3 C)  TempSrc:      SpO2: 97% 91% 93% 98%  Weight:      Height:        Intake/Output Summary (Last 24 hours) at 04/16/2020 1840 Last data filed at 04/16/2020 1830 Gross per 24 hour  Intake --  Output 1925 ml  Net -1925 ml   Filed Weights   04/10/2020 1106  Weight: 98.9 kg   Examination: Physical Exam:  Constitutional: WN/WD obese Caucasian male currently appears calm and comfortable resting.  Pleasantly confused and unable to provide a subjective history. Eyes: Eyes are closed and lids appear normal ENMT: External Ears, Nose appear normal.  Neck: Appears normal, supple, no cervical masses, normal ROM, no appreciable thyromegaly; no JVD Respiratory: Diminished to auscultation bilaterally with coarse breath sounds and some crackles, no wheezing, rales, rhonchi.  Patient is slightly tachypneic with an elevated respiratory rate and is unlabored breathing by midline spine bronchiolitis as well Cardiovascular: RRR, no murmurs / rubs / gallops. S1 and S2 auscultated.  1-2+ lower extremity edema Abdomen: Soft, non-tender, distended secondary to body habitus.  Bowel sounds positive.  GU: Deferred. Musculoskeletal: No clubbing / cyanosis of digits/nails. No joint deformity upper and lower extremities. Skin: No rashes, lesions, ulcers on to skin evaluation. No induration; Warm and dry.  Neurologic: Does not follow commands and is not Awake enough to participate Psychiatric: Impaired judgment and insight. He is not awake or  alert.   Data Reviewed: I have personally reviewed following labs and imaging studies  CBC: Recent Labs  Lab 04/17/2020 1129 04/03/2020 2325 04/12/20 0540 04/16/20 0853  WBC 6.4 7.8 8.8 8.6  NEUTROABS 3.9  --   --  6.2  HGB 11.5* 12.4* 12.1* 11.9*  HCT 36.0* 38.5* 37.5* 40.0  MCV 82.9 82.6 83.0 87.0  PLT 198 215 205 252   Basic Metabolic Panel: Recent Labs  Lab 04/18/2020 1129 03/27/2020 2209 04/17/2020 2325 04/12/20 0540 04/13/20 0500 04/16/20 0853 04/16/20 1645  NA 136  --   --  141 140 150*  --   K 4.6  --   --  4.9 4.7 4.7  --   CL 103  --   --  106 108 116*  --   CO2  24  --   --  24 19* 23  --   GLUCOSE 210*  --   --  144* 137* 161*  --   BUN 21  --   --  16 21 27*  --   CREATININE 1.18  --  1.03 1.04 1.15 1.32*  --   CALCIUM 9.1  --   --  9.5 9.2 9.0  --   MG  --  1.8  --   --   --  2.0  --   PHOS  --  3.4  --   --   --  4.4 2.8   GFR: Estimated Creatinine Clearance: 54.4 mL/min (A) (by C-G formula based on SCr of 1.32 mg/dL (H)). Liver Function Tests: Recent Labs  Lab 04/21/2020 2209 04/12/20 0540 04/16/20 0853  AST ALT ALKPHOS 90 86 78  BILITOT 0.8 0.8 1.3*  PROT 7.2 6.9 6.1*  ALBUMIN 3.9 3.7 3.0*   No results for input(s): LIPASE, AMYLASE in the last 168 hours. Recent Labs  Lab 03/30/2020 2208  AMMONIA 21   Coagulation Profile: Recent Labs  Lab 04/16/20 1645  INR 1.3*   Cardiac Enzymes: No results for input(s): CKTOTAL, CKMB, CKMBINDEX, TROPONINI in the last 168 hours. BNP (last 3 results) Recent Labs    04/18/19 1510  PROBNP 975*   HbA1C: No results for input(s): HGBA1C in the last 72 hours. CBG: Recent Labs  Lab 04/16/20 0026 04/16/20 0403 04/16/20 0803 04/16/20 1306 04/16/20 1612  GLUCAP 144* 127* 135* 161* 149*   Lipid Profile: No results for input(s): CHOL, HDL, LDLCALC, TRIG, CHOLHDL, LDLDIRECT in the last 72 hours. Thyroid Function Tests: Recent Labs    04/14/20 0143  FREET4 1.30*  T3FREE 2.4   Anemia  Panel: Recent Labs    04/14/20 0143  VITAMINB12 693   Sepsis Labs: Recent Labs  Lab 04/16/20 1645  PROCALCITON 0.11  LATICACIDVEN 1.4    Recent Results (from the past 240 hour(s))  Resp Panel by RT-PCR (Flu A&B, Covid) Nasopharyngeal Swab     Status: None   Collection Time: 03/28/2020  9:07 PM   Specimen: Nasopharyngeal Swab; Nasopharyngeal(NP) swabs in vial transport medium  Result Value Ref Range Status   SARS Coronavirus 2 by RT PCR NEGATIVE NEGATIVE Final    Comment: (NOTE) SARS-CoV-2 target nucleic acids are NOT DETECTED.  The SARS-CoV-2 RNA is generally detectable in upper respiratory specimens during the acute phase of infection. The lowest concentration of SARS-CoV-2 viral copies this assay can detect is 138 copies/mL. A negative result does not preclude SARS-Cov-2 infection and should not be used as the sole basis for treatment or other patient management decisions. A negative result may occur with  improper specimen collection/handling, submission of specimen other than nasopharyngeal swab, presence of viral mutation(s) within the areas targeted by this assay, and inadequate number of viral copies(<138 copies/mL). A negative result must be combined with clinical observations, patient history, and epidemiological information. The expected result is Negative.  Fact Sheet for Patients:  BloggerCourse.com  Fact Sheet for Healthcare Providers:  SeriousBroker.it  This test is no t yet approved or cleared by the [place] FDA and  has been authorized for detection and/or diagnosis of SARS-CoV-2 by FDA under an Emergency Use Authorization (EUA). This EUA will remain  in effect (meaning this test can be used) for the duration of the COVID-19 declaration under Section 564(b)(1) of the Act, 21 U.S.C.section 360bbb-3(b)(1), unless the authorization  is terminated  or revoked sooner.       Influenza A by PCR NEGATIVE  NEGATIVE Final   Influenza B by PCR NEGATIVE NEGATIVE Final    Comment: (NOTE) The Xpert Xpress SARS-CoV-2/FLU/RSV plus assay is intended as an aid in the diagnosis of influenza from Nasopharyngeal swab specimens and should not be used as a sole basis for treatment. Nasal washings and aspirates are unacceptable for Xpert Xpress SARS-CoV-2/FLU/RSV testing.  Fact Sheet for Patients: BloggerCourse.com  Fact Sheet for Healthcare Providers: SeriousBroker.it  This test is not yet approved or cleared by the [place] FDA and has been authorized for detection and/or diagnosis of SARS-CoV-2 by FDA under an Emergency Use Authorization (EUA). This EUA will remain in effect (meaning this test can be used) for the duration of the COVID-19 declaration under Section 564(b)(1) of the Act, 21 U.S.C. section 360bbb-3(b)(1), unless the authorization is terminated or revoked.  Performed at [facility], [address], [place], [place] [address]   Culture, blood (routine x 2)     Status: None (Preliminary result)   Collection Time: 04/16/20 10:24 AM   Specimen: BLOOD RIGHT HAND  Result Value Ref Range Status   Specimen Description BLOOD RIGHT HAND  Final   Special Requests   Final    BOTTLES DRAWN AEROBIC AND ANAEROBIC Blood Culture adequate volume   Culture   Final    NO GROWTH <12 HOURS Performed at [facility], [address], [place], [place] [address]    Report Status PENDING  Incomplete     RN Pressure Injury Documentation:     Estimated body mass index is 31.29 kg/m as calculated from the following:   Height as of this encounter: 5\' 10"  (1.778 m).   Weight as of this encounter: 98.9 kg.  Malnutrition Type:  Nutrition Problem: Inadequate oral intake Etiology:  (altered mental status)   Malnutrition Characteristics:  Signs/Symptoms: NPO status   Nutrition Interventions:  Interventions: Refer  to RD note for recommendations     Radiology Studies: MR BRAIN W WO CONTRAST  Result Date: 04/17/2020 CLINICAL DATA:  Mental status change, unknown cause. Multiple recent falls. EXAM: MRI HEAD WITHOUT AND WITH CONTRAST TECHNIQUE: Multiplanar, multiecho pulse sequences of the brain and surrounding structures were obtained without and with intravenous contrast. CONTRAST:  10mL GADAVIST GADOBUTROL 1 MMOL/ML IV SOLN COMPARISON:  Head CT 05/04/20.  CT angiogram head/neck 08/29/2012. FINDINGS: Brain: Mild cerebral atrophy. Mild periventricular and scattered T2/FLAIR hyperintensity within the cerebral white matter is nonspecific, but compatible with chronic small vessel ischemic disease. Scattered SWI signal loss along the bilateral frontal, parietal, occipital and temporal lobes compatible with chronic blood products and sequela of remote subarachnoid hemorrhage. Nonspecific chronic microhemorrhage within the left cerebellar hemisphere (series 8, image 10). There is no acute infarct. No evidence of intracranial mass. No extra-axial fluid collection. No midline shift. No abnormal intracranial enhancement. Vascular: Expected proximal arterial flow voids. Skull and upper cervical spine: No focal marrow lesion. Sinuses/Orbits: Visualized orbits show no acute finding. Mild paranasal sinus mucosal thickening, most notably ethmoidal. Small mucous retention cysts within the right sphenoid and left maxillary sinuses. IMPRESSION: No evidence of acute intracranial abnormality. Scattered chronic blood products along the bilateral cerebral hemispheres compatible with sequela of remote subarachnoid hemorrhage. Mild cerebral atrophy and chronic small vessel ischemic disease. Paranasal sinus disease as described. Electronically Signed   By: 10/29/2012 DO   On: 03/27/2020 10:26   DG CHEST PORT 1 VIEW  Result Date: 04/16/2020 CLINICAL DATA:  Atrial fibrillation.  Shortness of breath. EXAM: PORTABLE CHEST 1 VIEW COMPARISON:   04/09/2020 FINDINGS: Chronic cardiomegaly. Pulmonary venous hypertension without frank edema. No infiltrate, collapse or effusion. The patient has not taken a deep inspiration. IMPRESSION: Cardiomegaly and pulmonary venous hypertension. No frank edema. No infiltrate or collapse. Electronically Signed   By: Paulina Fusi M.D.   On: 04/16/2020 10:48   Scheduled Meds: . acetaminophen  1,000 mg Oral Q8H  . ipratropium  0.5 mg Nebulization Q6H  . levalbuterol  0.63 mg Nebulization Q6H  . mouth rinse  15 mL Mouth Rinse BID   Continuous Infusions: . midazolam    . valproate sodium 250 mg (04/16/20 1029)    LOS: 5 days   Merlene Laughter, DO [facility] PAGER is on AMION  If 7PM-7AM, please contact night-coverage [email or web address]

## 2020-04-16 NOTE — Progress Notes (Signed)
Telemetry/Cardiac monitoring just called stating the patient had a 26 beat run of Vtach.  Dr. Marland Mcalpine has been notified, will continue to monitor.

## 2020-04-16 NOTE — TOC Initial Note (Signed)
Transition of Care [facility]) - Initial/Assessment Note    Patient Details  Name: Alan [name] MRN: [id number] Date of Birth: January 28, 1941  Transition of Care [facility]) CM/SW Contact:    Alan Busing, RN Phone Number: [phone]  04/16/2020, 2:27 PM  Clinical Narrative:   [facility] consulted for patient in need of SNF placement. CM spoke with daughter Alan [name] who confirmes that she is considering SNF placement. Alan [name] has been in contact with [facility] and states that patient has full VA benefits. Daughter gave CM contact info for [facility] [place] [phone] ext [phone]. Daughter gave verbal consent for CM to begin snf workup. CM at bedside to assess patient but patient is unable to participate. Patient is disoriented with incoherent speech. CM will initiate FL2 after PT/OT evaluations for recommendations.     Barriers to Discharge: No Barriers Identified   Patient Goals and CMS Choice Patient states their goals for this hospitalization and ongoing recovery are:: patient is wanting to go home CMS Medicare.gov Compare Post Acute Care list provided to:: Patient Choice offered to / list presented to : Patient  Expected Discharge Plan and Services                                                Prior Living Arrangements/Services                       Activities of Daily Living Home Assistive Devices/Equipment: None ADL Screening (condition at time of admission) Patient's cognitive ability adequate to safely complete daily activities?: No Is the patient deaf or have difficulty hearing?: Yes (H.O.H.) Does the patient have difficulty seeing, even when wearing glasses/contacts?: Yes (cataracts-both eyes per caregiver) Does the patient have difficulty concentrating, remembering, or making decisions?: Yes Patient able to express need for assistance with ADLs?: No Does the patient have difficulty dressing or bathing?: Yes Independently performs ADLs?:  No Communication: Needs assistance Is this a change from baseline?: Pre-admission baseline Dressing (OT): Needs assistance Is this a change from baseline?: Pre-admission baseline Grooming: Needs assistance Is this a change from baseline?: Pre-admission baseline Feeding: Needs assistance Is this a change from baseline?: Pre-admission baseline Bathing: Needs assistance Is this a change from baseline?: Pre-admission baseline Toileting: Needs assistance Is this a change from baseline?: Pre-admission baseline In/Out Bed: Needs assistance Is this a change from baseline?: Pre-admission baseline Walks in Home: Needs assistance Is this a change from baseline?: Pre-admission baseline Does the patient have difficulty walking or climbing stairs?: No Weakness of Legs: None Weakness of Arms/Hands: None  Permission Sought/Granted                  Emotional Assessment              Admission diagnosis:  Delirium [R41.0] Fall [W19.XXXA] Fall, initial encounter [id number]] Closed fracture of one rib of right side, initial encounter [S22.31XA] Compression fracture of body of thoracic vertebra (HCC) [S22.000A] Confusion and disorientation [R41.0] Patient Active Problem List   Diagnosis Date Noted  . Delirium 04/25/2020  . Essential hypertension 11/23/2016  . Chronic systolic CHF (congestive heart failure) (HCC) 09/11/2016  . Uncontrolled type 2 diabetes mellitus with hyperglycemia, without long-term current use of insulin (HCC) 09/11/2016  . Esophageal dysmotility 09/11/2016  . Thyroid nodule 09/08/2016  . Chest pressure 05/10/2016  . Insomnia 05/10/2016  .  NICM (nonischemic cardiomyopathy) (HCC) 02/06/2015  . Thoracic aortic aneurysm without rupture (HCC) 02/06/2015  . Aortic insufficiency 02/06/2015  . Hyperlipidemia 02/06/2015  . 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 Malfunction of penile prosthesis (HCC) 09/24/2011   PCP:  [facility] Pharmacy:    [facility] - [place], [place] - [address] [place] [address] Phone: [phone] Fax: [phone]  [facility] [place], [place] - [address] [phone] [facility] [address] [facility] [place] [address] Phone: [phone] Fax: [phone]  [facility] [phone] [place], [place] - [address] AT [facility] [address] [place] [address] Phone: [phone] Fax: [phone]     Social Determinants of Health (SDOH) Interventions    Readmission Risk Interventions Readmission Risk Prevention Plan 04/16/2020  Post Dischage Appt Complete  Medication Screening Complete  Transportation Screening Complete  Some recent data might be hidden

## 2020-04-16 NOTE — Progress Notes (Signed)
Brief HPI: 79 y.o. male  with past medical history of PTSD, insomnia, NICM, CHF (EF 40 - 45%), frequent falls, SAH in Jan 2021, Aifb, AAA (4.9 cm) extreme claustrophobia, and presbyesophagus who was admitted on 2020/05/07 with delirium and extreme agitation. No identifiable causes of delirium have been found through work-up during his hospital. Palliative care has been consulted for assistance in management.  Baseline behavior includes progressive development of behavior for approximately one year with thrashing in bed while sleeping, waxing and waning cognition, intermittent shuffling Parkinsonian gait, visual and auditoy hallucinations, and progressive loss of memory.  Subjective:  Patient in bed, eyes closed without agitation, somnolent.  Caregiver/girlfriend at bedside. Spoke with caregiver about patient, plan of care options (medications for agitation and delirium IV vs. PO if Mr. Tangonan begins taking PO again) and moving forward with palliative care. Spoke briefly about progressive neurodegenerative disorder with low liklihood of patient becoming mobile or returning to baseline at this point. Caregiver identified that patient has three daughters that are responsible for decision making.   Exam: Vitals:   04/16/20 0902 04/16/20 0958  BP:  (!) 119/53  Pulse:  85  Resp:  (!) 23  Temp:  98.9 F (37.2 C)  SpO2: 90% 93%   Gen: In bed, sleeping, does not open eyes to stimulation. No vocalizations this morning. Does not interact with examiner. Resp: non-labored breathing, no acute distress Abd: soft, non-distended  Neuro: MS: In bed, sleeping, does not interact with examiner. Somnolent without agitation seen in prior exams. Resists eye opening, does not follow commands. Does not respond to verbal or tactile stimuli.   CN: DZ:HGDJM. Pupils briskly reactive with examiner eye opening.  III,IV, EQ:ASTM are conjugate without forced gaze deviation.Does not track examiner.  V,VII:Reacts  equally to eyelid stimulation bilaterally. Face appears symmetric resting. Does not grimace.  VIII, IX, X:Unable to assess IX,X:Phonation unable to assess HD:QQIW is midline LNL:GXQJJH to assess Motor: Normal bulk, poor tone. Does not move any extremity spontaneously. Sensory: Withdrawals to noxious stimuli bilateral uppers and lowers, unable to assess sensation to light touch or 2-point discrimination due to somnolence and altered mental status DTR: 1+ patellae.   Pertinent Labs: Creatinine 1.32  GFR 55 A1c 8.3% Glucose 161  Impression:  79 y.o. male  with past medical history of PTSD, insomnia, NICM, CHF (EF 40 - 45%), frequent falls, SAH in Jan 2021, Aifb, AAA (4.9 cm) extreme claustrophobia, and presbyesophagus who was admitted on 07-May-2020 with delirium and extreme agitation. No identifiable causes of delirium have been found through work-up during his hospital stay suggesting acute delirium superimposed on progressive neurodegenerative disorder; late-stage Lewy body dementia. Palliative care has been in contact with family with progressive decline in mental status.   Recommendations: - Consider medications that may be crushed/given in small amounts of applesauce to address agitation such as citalopram 20 mg/d, donepezil 5 mg/d, and depakote sprinkles 250 mg bid if able to take PO or if family supports use of NGT.  - Depakote IV 250 mg BID seems to have helped with agitation at this time; continue as needed and switch to PO if patient can take orally or if family agreeable to NGT in goals of care   - Defer feeding tube decision to Palliative care/ family discussion of goals of care - Avoid Haldol at this time: Avoid neuroleptics in patients with possible or confirmed Lewy body dementia, as these medications can result in significant motor and cognitive worsening.  - Consider Aricept trial if/when he  stabilizes  Neurology will be available on an as needed basis.   Lanae Boast,  AGACNP-BC [facility]  Attestation:  I saw this patient with the APP on 04/16/20, obtained pertinent aspects of the history, and performed relevant physical and neurological examination as documented. Also, I reviewed the available laboratory data and neuroimages, and other relevant tests/notes/procedures.  My examination findings include somnolence. Pupils react to light. Tone is normal when he's asleep. Skin color palel/dry.   Impression: LBD, late stage. Not taking po Agree with palliative care team regarding GOC/hospice options  Recommendations: As noted above Not much more to add at this juncture, but will remain available should additional questions arise.  Thank you.  Meredeth Ide, MD

## 2020-04-16 NOTE — Evaluation (Signed)
Clinical/Bedside Swallow Evaluation Patient Details  Name: Alan [name] MRN: [id number] Date of Birth: [birth year]  Today's Date: 04/16/2020 Time: SLP Start Time (ACUTE ONLY): 5498 SLP Stop Time (ACUTE ONLY): 0940 SLP Time Calculation (min) (ACUTE ONLY): 15 min  Past Medical History:  Past Medical History:  Diagnosis Date  . A-fib (HCC)   . AAA (abdominal aortic aneurysm) (HCC) 2018  . Arthritis   . CHF (congestive heart failure) (HCC)   . Chronic systolic (congestive) heart failure (HCC)   . Chronic systolic CHF (congestive heart failure) (HCC)   . Claustrophobia    EXTREME   . Diabetes mellitus ORAL MED  . Diverticulosis   . ED (erectile dysfunction)   . GAD (generalized anxiety disorder)   . GERD (gastroesophageal reflux disease)   . Glaucoma of right eye   . History of diverticulitis   . History of nuclear stress test    MV 1/18: EF 33, no ischemia; high risk  . History of prostate cancer   . Lung nodules   . Malfunction of artificial urethral sphincter (HCC)   . Malfunction of penile prosthesis (HCC)   . NICM (nonischemic cardiomyopathy) (HCC) 02/06/2015   LHC 11/14: EF 35, no significant CAD // Echo 7/16: Diff HK worse in inf base, EF 40%, Gr 1 DD, mild AI, mild MR, mod LAE // Echo 5/18: Moderate concentric LVH, EF 35-40, inferolateral and anterolateral AK, normal wall motion, moderate AI, moderate MR, moderate LAE, mild PI  . Nocturia   . Presbyesophagus 2018  . Snoring   . SUI (stress urinary incontinence), male   . Thoracic aortic aneurysm without rupture (HCC) 02/06/2015   Chest CTA 1/18:  5.1 cm ascending aortic aneurysm, mild cardiomegaly, mild coronary artery calcification, upper lobe small airway disease, cholelithiasis, 16 mm right thyroid nodule  . Thyroid nodule    Past Surgical History:  Past Surgical History:  Procedure Laterality Date  . ANKLE SURGERY Bilateral   . BALLOON DILATION N/A 12/04/2019   Procedure: BALLOON DILATION;  Surgeon: Sherrilyn Rist, MD;  Location: [facility];  Service: Gastroenterology;  Laterality: N/A;  . BIOPSY THYROID    . COLONOSCOPY  2010  . ESOPHAGOGASTRODUODENOSCOPY N/A 12/04/2019   Procedure: ESOPHAGOGASTRODUODENOSCOPY (EGD);  Surgeon: Sherrilyn Rist, MD;  Location: [facility];  Service: Gastroenterology;  Laterality: N/A;  . I & D/ ORIF RIGHT INDEX FINGER  01-19-2005  . INSERTION INFLATABLE PENILE PROSTHESIS  06-22-1999   MENTOR ALPHA I  . IR GENERIC HISTORICAL  06/17/2016   IR RADIOLOGIST EVAL & MGMT 06/17/2016 Jolaine Click, MD [facility] INTERV RAD  . LEFT HEART CATHETERIZATION WITH CORONARY ANGIOGRAM N/A 03/15/2013   Procedure: LEFT HEART CATHETERIZATION WITH CORONARY ANGIOGRAM;  Surgeon: [name], MD;  Location: [facility] CATH LAB;  Service: Cardiovascular;  Laterality: N/A;  . PENILE PROSTHESIS IMPLANT  09/24/2011   Procedure: PENILE PROTHESIS INFLATABLE;  Surgeon: [name], MD;  Location: [facility];  Service: Urology;  Laterality: N/A;  REPLACEMENT OF INFLATABLE PENILE PROTHESIS (COLOPLAST)    . PLACEMENT ARTIFICIAL URINARY SPHINTER  01-20-2001   [id number]  . REMOVAL AND REPLACEMENT GU SPHINTER CUFF  05-15-2004   STRESS URINARY INCONTINENCE  . RIGHT HEART CATH N/A 06/28/2019   Procedure: RIGHT HEART CATH;  Surgeon: Laurey Morale, MD;  Location: [facility] INVASIVE CV LAB;  Service: Cardiovascular;  Laterality: N/A;   HPI:  Pt is a 79 y.o. male with medical history significant for diabetes, PTSD, insomnia, hyperlipidemia, systolic dysfunction  CHF, nonischemic cardiomyopathy, hypertension, SAH s/p fall which resolved, and GERD who was brought in by his caregiver due to worsening mental status, delirium and agitation.Marland Kitchen MRI brain: No evidence of acute intracranial abnormality. Prognosis judged to be poor by MD with ongoing psychiatric symptoms, poor quality of life. Palliative care was consulted and family is hopeful for SNF if no improvement in symptoms but will otherwise be  appropriate for Hospice House.   Assessment / Plan / Recommendation Clinical Impression  Pt was seen for bedside swallow evaluation with his caregiver (girlfriend) present. Pt's caregiver reported that the pt coughs up thick phlegm when he swallows solids or liquids and when he lays flat. She stated that he was prescribde omeprozole due to this issue without any change. He is scheduled to have an esophagram on 04/21/20. Pt's level of alertness was suboptimal; he did not respond to verbal stimulation or follow any commands. A complete oral mechanism exam was therefore not conducted. Oral care was provided and pt exhibited some mandibular movement in response to oral stimulation, but no sucking motions were demonstrated as the caregiver reported he did over the weekend. Pt exhibited reduced bolus awareness and no bolus manipulation was noted with ice chips despite verbal and tactile cues. An NPO status is recommended with possible short-term non oral alimentation (e.g., Cortrak) if this aligns with the pt/family's goals of care. SLP will follow to assess improvement in swallow function.  SLP Visit Diagnosis: Dysphagia, unspecified (R13.10)    Aspiration Risk  Moderate aspiration risk    Diet Recommendation NPO;Alternative means - temporary   Medication Administration: Via alternative means    Other  Recommendations Oral Care Recommendations: Oral care QID;Staff/trained caregiver to provide oral care   Follow up Recommendations  (TBD)      Frequency and Duration min 2x/week  2 weeks       Prognosis Prognosis for Safe Diet Advancement: Fair Barriers to Reach Goals: Severity of deficits;Cognitive deficits      Swallow Study   General Date of Onset: 03/31/2020 HPI: Pt is a 78 y.o. male with medical history significant for diabetes, PTSD, insomnia, hyperlipidemia, systolic dysfunction CHF, nonischemic cardiomyopathy, hypertension, SAH s/p fall which resolved, and GERD who was brought in by his  caregiver due to worsening mental status, delirium and agitation.Marland Kitchen MRI brain: No evidence of acute intracranial abnormality. Prognosis judged to be poor by MD with ongoing psychiatric symptoms, poor quality of life. Palliative care was consulted and family is hopeful for SNF if no improvement in symptoms but will otherwise be appropriate for Hospice House. Type of Study: Bedside Swallow Evaluation Previous Swallow Assessment: None Diet Prior to this Study: Regular;Thin liquids (but p.o. is currently being deferred) Temperature Spikes Noted: No Respiratory Status: Nasal cannula History of Recent Intubation: No Behavior/Cognition: Lethargic/Drowsy Oral Cavity Assessment: Within Functional Limits Oral Care Completed by SLP: Yes Oral Cavity - Dentition: Adequate natural dentition;Missing dentition Vision: Functional for self-feeding Self-Feeding Abilities: Total assist Patient Positioning: Upright in bed;Postural control adequate for testing Baseline Vocal Quality: Not observed Volitional Swallow: Able to elicit    Oral/Motor/Sensory Function Overall Oral Motor/Sensory Function:  (Pt unable to participate in assessment)   Ice Chips Ice chips: Impaired Presentation: Spoon Oral Phase Impairments: Poor awareness of bolus   Thin Liquid Thin Liquid: Not tested    Nectar Thick Nectar Thick Liquid: Not tested   Honey Thick Honey Thick Liquid: Not tested   Puree Puree: Not tested   Solid     Solid: Not tested  Donicia Druck I. Vear Clock, MS, CCC-SLP Acute Rehabilitation Services Office number [phone] Pager [phone]  Scheryl Marten 04/16/2020,10:03 AM

## 2020-04-16 NOTE — Progress Notes (Signed)
Pharmacy Antibiotic Note  Alan [name] is a 79 y.o. male admitted on 04/18/20 now with pneumonia and sepsis.  Pharmacy has been consulted for vancomycin and cefepime dosing.  Plan: Vancomycin 2000 mg IV x 1 loading dose, followed by  Vancomycin 1000 mg IV every 24 hours. Goal AUC 400-550 (calcAUC 460) Cefepime 2 grams IV every 12 hours Monitor clinical progress, cultures/sensitivities, renal function, abx plan Vancomycin levels as indicated    Height: 5\' 10"  (177.8 cm) Weight: 98.9 kg (218 lb 1.6 oz) IBW/kg (Calculated) : 73  Temp (24hrs), Avg:100.2 F (37.9 C), Min:98.9 F (37.2 C), Max:102.7 F (39.3 C)  Recent Labs  Lab 2020-04-18 1129 April 18, 2020 2325 04/12/20 0540 04/13/20 0500 04/16/20 0853  WBC 6.4 7.8 8.8  --  8.6  CREATININE 1.18 1.03 1.04 1.15 1.32*    Estimated Creatinine Clearance: 54.4 mL/min (A) (by C-G formula based on SCr of 1.32 mg/dL (H)).    Allergies  Allergen Reactions  . Ezetimibe Other (See Comments)    Confusion  . Statins Other (See Comments)    Myalgias  . Levaquin [Levofloxacin]     Can cause problem with aneurism, use PCN or Keflex   . Lacosamide Rash  . Other     Pt is extremely claustrophobic, will have a severe panic attack if he is left alone in a small space including an empty hospital room. Especially after waking up from surgery.   . Pravastatin Palpitations  . Ropinirole     Nightmares     Antimicrobials this admission: 12/22 vanc >> 12/22 cefepime >>   Dose adjustments this admission:   Microbiology results: 12/22 BCx: sent 12/22 UCx: sent  12/17 COVID/Flu: neg    Thank you for allowing 1/18 to participate in this patients care.   [place], PharmD Please see amion for complete clinical pharmacist phone list. 04/16/2020 5:12 PM

## 2020-04-16 NOTE — Care Management Important Message (Signed)
Important Message  Patient Details  Name: Alan [name] MRN: [id number] Date of Birth: [birth year]   Medicare Important Message Given:  Yes     [name] 04/16/2020, 3:56 PM

## 2020-04-17 DIAGNOSIS — G9340 Encephalopathy, unspecified: Secondary | ICD-10-CM

## 2020-04-17 LAB — BLOOD CULTURE ID PANEL (REFLEXED) - BCID2

## 2020-04-17 MED ORDER — ACETAMINOPHEN 325 MG PO TABS: 650.0000 mg | ORAL_TABLET | Freq: Four times a day (QID) | ORAL | Status: DC | PRN

## 2020-04-17 MED ORDER — ACETAMINOPHEN 650 MG RE SUPP
650.0000 mg | Freq: Four times a day (QID) | RECTAL | Status: DC | PRN
  Administered 2020-04-17 – 2020-04-18 (×4): 650 mg via RECTAL
  Filled 2020-04-17 (×4): qty 1

## 2020-04-17 MED ORDER — GLYCOPYRROLATE 0.2 MG/ML IJ SOLN: 0.2000 mg | INTRAMUSCULAR | Status: DC | PRN

## 2020-04-17 MED ORDER — GLYCOPYRROLATE 1 MG PO TABS: 1.0000 mg | ORAL_TABLET | ORAL | Status: DC | PRN

## 2020-04-17 MED ORDER — SODIUM CHLORIDE 0.9 % IV SOLN: INTRAVENOUS | Status: DC

## 2020-04-17 NOTE — Progress Notes (Signed)
PHARMACY - PHYSICIAN COMMUNICATION CRITICAL VALUE ALERT - BLOOD CULTURE IDENTIFICATION (BCID)  Alan [name] is an 79 y.o. male who presented to [facility] on 04/21/2020 with a chief complaint of confusion.   Assessment:  79 year old male admitted with delirium. 1/4 blood cultures are now positive with Staph species - This is likely a contaminant. Noted that patient is comfort care.   Name of physician (or Provider) Contacted: Marland Mcalpine  Current antibiotics: None  Changes to prescribed antibiotics recommended:  None  Results for orders placed or performed during the hospital encounter of 21-Apr-2020  Blood Culture ID Panel (Reflexed) (Collected: 04/16/2020 10:24 AM)  Result Value Ref Range   Enterococcus faecalis NOT DETECTED NOT DETECTED   Enterococcus Faecium NOT DETECTED NOT DETECTED   Listeria monocytogenes NOT DETECTED NOT DETECTED   Staphylococcus species DETECTED (A) NOT DETECTED   Staphylococcus aureus (BCID) NOT DETECTED NOT DETECTED   Staphylococcus epidermidis NOT DETECTED NOT DETECTED   Staphylococcus lugdunensis NOT DETECTED NOT DETECTED   Streptococcus species NOT DETECTED NOT DETECTED   Streptococcus agalactiae NOT DETECTED NOT DETECTED   Streptococcus pneumoniae NOT DETECTED NOT DETECTED   Streptococcus pyogenes NOT DETECTED NOT DETECTED   A.calcoaceticus-baumannii NOT DETECTED NOT DETECTED   Bacteroides fragilis NOT DETECTED NOT DETECTED   Enterobacterales NOT DETECTED NOT DETECTED   Enterobacter cloacae complex NOT DETECTED NOT DETECTED   Escherichia coli NOT DETECTED NOT DETECTED   Klebsiella aerogenes NOT DETECTED NOT DETECTED   Klebsiella oxytoca NOT DETECTED NOT DETECTED   Klebsiella pneumoniae NOT DETECTED NOT DETECTED   Proteus species NOT DETECTED NOT DETECTED   Salmonella species NOT DETECTED NOT DETECTED   Serratia marcescens NOT DETECTED NOT DETECTED   Haemophilus influenzae NOT DETECTED NOT DETECTED   Neisseria meningitidis NOT DETECTED NOT  DETECTED   Pseudomonas aeruginosa NOT DETECTED NOT DETECTED   Stenotrophomonas maltophilia NOT DETECTED NOT DETECTED   Candida albicans NOT DETECTED NOT DETECTED   Candida auris NOT DETECTED NOT DETECTED   Candida glabrata NOT DETECTED NOT DETECTED   Candida krusei NOT DETECTED NOT DETECTED   Candida parapsilosis NOT DETECTED NOT DETECTED   Candida tropicalis NOT DETECTED NOT DETECTED   Cryptococcus neoformans/gattii NOT DETECTED NOT DETECTED    Sharin Mons, PharmD, BCPS, BCIDP Infectious Diseases Clinical Pharmacist Phone: [phone] 04/17/2020  9:39 AM

## 2020-04-17 NOTE — TOC Progression Note (Deleted)
Transition of Care [facility]) - Progression Note    Patient Details  Name: Alan [name] MRN: [id number] Date of Birth: 1941-01-31  [facility]) CM/SW Contact  Beckie Busing, RN Phone Number: [phone]  04/17/2020, 9:02 AM  Clinical Narrative:    To whom it may concern: Please be advised that the above- named patient will require a short- term nursing home stay- anticipated 30 days or less for rehabilitation and  Strengthening. The plan is to return home.      Barriers to Discharge: No Barriers Identified  Expected Discharge Plan and Services                                                 Social Determinants of Health (SDOH) Interventions    Readmission Risk Interventions Readmission Risk Prevention Plan 04/16/2020  Post Dischage Appt Complete  Medication Screening Complete  Transportation Screening Complete  Some recent data might be hidden

## 2020-04-17 NOTE — Progress Notes (Signed)
Civil engineer, contracting [facility])  Referral received for EOL care at [facility].  We do not have a bed to offer today and there is not much anticipated turnover in the next few days.  Left message for [facility] and provided contact information.  [facility] will update TOC and family once bed availability changes.   Thank you, Wallis Bamberg RN, BSN, CCRN [facility] Liaison

## 2020-04-17 NOTE — Progress Notes (Signed)
PROGRESS NOTE    Alan [name]  WPY:[id number] DOB: [birth year] DOA: 05-04-2020 PCP: [facility]   Brief Narrative:  The patient is a 79 year old elderly Caucasian male with a past medical history significant for but not limited to type 2 diabetes mellitus on insulin, PTSD, insomnia, hyperlipidemia, nonischemic cardiomyopathy, hypertension, GERD as well as other comorbidities who was brought to the hospital by his girlfriend caregiver with worsening mental status, delirium, agitation and recurrent falls.  Patient is a [place] veteran and has a history of severe insomnia and PTSD.  He has been living at home with his caretaker was noticed worsening symptoms for about a week.  He has been falling multiple times and has been very impulsive and delirious.  Is admitted to the hospital for more work-up and psychiatric symptomatic stabilization.  Patient failed to improve and after subsequent goals of care discussion he was transitioned to comfort care measures and is a very high risk for imminent decompensation and likely even possible hospital death.  He has been transitioned for end-of-life care and residential hospice referral has been made and it is not unexpected for the patient to further decompensate.  Patient spiked a temperature today but would not work this up further given that he is being transitioned to comfort care now.  She remains comfortable however it is a very high risk for decompensation and if he is not transitioned to residential hospice imminent in hospital death is expected  Assessment & Plan:   Principal Problem:   Terminal care Active Problems:   NICM (nonischemic cardiomyopathy) (HCC)   Hyperlipidemia   Insomnia   Chronic systolic CHF (congestive heart failure) (HCC)   Uncontrolled type 2 diabetes mellitus with hyperglycemia, without long-term current use of insulin (HCC)   Essential hypertension   Delirium   End of life care  Delirium/acute metabolic  encephalopathy in the setting of underlying multiple psychiatric issues including PTSD and insomnia: Rapidly progressive neurological symptoms. -He has been belligerent, multiple falls, refusing to medical care and not sleeping,  -Unsafe to stay home. -Infection and metabolic work-up negative so far but he spiked a temperature yesterday and then spiked another temperature today and we will panculture -Seen by neurology and suspecting Lewy body dementia. -MRI with and without contrast today with anesthesia done today, shows no acute findings.  Or evidence of subarachnoid hemorrhage. -Symptomatic management, currently on as needed Ativan for symptom control. -Seen by psychiatry.   -Followed by neurology. -Palliative care was consulted for further evaluation and after further goals of care discussion patient was transitioned to comfort measures only and all medications were stopped letter not in the patient's comfort.  We will continue his acetaminophen 1000 mg every 8 hours if he is able to tolerate p.o., 650 mg p.o./RC every 6 hours as needed for mild pain, duo nebs 2.5 mg nebs every 4 hours as needed for wheezing, fentanyl 50 mcg IV nightly as needed for moderate pain, Atrovent 2.5 mg nebs every 6 hours scheduled, Xopenex 0.63 mg nebs scheduled every 6, Ativan 1 mg IV every 4 as needed anxiety, midazolam 2 mg/h IV continuous and valproic to 50 mg IV every 12  -Patient is at extremely high risk for further decompensation and referrals been made for hospice; if he does not go to residential hospice we will anticipate in-hospital death given his severity of illness and high risk for decompensation  Chronic A. fib:  -Rate controlled and has been tachycardic today.  On beta-blockers.  Not anticoagulation candidate.  Currently unable to take any oral medications given his encephalopathy -He will be made comfort care and shift of focus of been transitioning the patient to comfort  Multiple falls and  deconditioning:  -All-time fall precautions delirium precautions.  Will consult PT OT to see what level of care he will need however he will no longer be followed given that he is being transitioned to comfort care.  Coronary artery disease/nonischemic cardiomyopathy:  -Euvolemic.  Currently on maintenance IV fluid with normal saline as he is not eating and drinking but will transition to D5W given his hypernatremia; the D5W has now been stopped given the patient will transition to comfort  Hypernatremia -Patient is sodium went from 140 and today is 150 -Stop IV normal saline started D5W to help the sodium but Fluids stopped as he was transitioned to Comfort Care  SIRS without clear Evidence of Source -She spiked a temperature of 102.7, became tachypneic as well as tachycardic -He is started back on IV fluid hydration and pancultured and was to be initiated on antibiotics with IV vancomycin IV cefepime however after further goals of care discussion patient was transitioned to comfort care only and all his medications have been stopped that are not an line with the patient's comfort. -LA was Negative x2 yesterday -Continues to Spike Temperatures -Fluids with D5W was initiated but stopped now that he is being transitioned to comfort care -C/w Acetaminophen for Temp  Type 2 diabetes on insulin at home -Continued on lower doses to avoid hypoglycemia. -He was started on D5W but this has been stopped now that he is being transitioned to comfort care  Advance care planning /difficult to treat condition:  -Overall poor prognosis with ongoing psychiatric symptoms, poor quality of life. -Now patient with extreme symptoms, unable to eat meaningful. -My Colleague Discussed with patient's caretaker and significant other as well as patient's daughter on the phone. -We will continue attempt to control symptoms. -We will try different medications to keep him comfortable. -Will refer to palliative and  potential hospice. -He may qualify for inpatient hospice. -He was very uncomfortable today yesterday, will use Ativan to keep him comfortable and sedated; appeared comfortable today on my examination -After further goals of care discussion palliative discussed with the daughter and patient will transition to comfort care measures referral has been made for residential hospice  Obesity -Complicates overall prognosis and care -Estimated body mass index is 31.29 kg/m as calculated from the following:   Height as of this encounter:  (1.778 m).   Weight as of this encounter: 98.9 kg. -Weight Loss and Dietary Counseling given   DVT prophylaxis: He is on enoxaparin 40 mg subcu we will stop this given that he is being transitioned Code Status: DO NOT RESUSCITATE Family Communication: Girlfriend was at bedside yesterday and no family present today  Disposition Plan: Anticipate in-hospital death if he is not transferred to residential hospice; patient is a very high risk for further decompensation given his multiple comorbidities and now that he is being transitioned to comfort care imminent death is not unexpected  Status is: Inpatient  Remains inpatient appropriate because:Unsafe d/c plan, IV treatments appropriate due to intensity of illness or inability to take PO and Inpatient level of care appropriate due to severity of illness   Dispo: The patient is from: Home              Anticipated d/c is to: Residential Hospice  Anticipated d/c date is: 1-2 days              Patient currently is not medically stable to d/c.  Consultants:   Neurology  Palliative Care Medicine    Procedures: MRI  Antimicrobials:  Anti-infectives (From admission, onward)   Start     Dose/Rate Route Frequency Ordered Stop   04/16/20 1730  vancomycin (VANCOCIN) IVPB 1000 mg/200 mL premix  Status:  Discontinued        1,000 mg 200 mL/hr over 60 Minutes Intravenous  Once 04/16/20 1641 04/16/20  1734   04/16/20 1730  ceFEPIme (MAXIPIME) 2 g in sodium chloride 0.9 % 100 mL IVPB  Status:  Discontinued        2 g 200 mL/hr over 30 Minutes Intravenous  Once 04/16/20 1641 04/16/20 1734   04/16/20 1715  vancomycin (VANCOREADY) IVPB 2000 mg/400 mL  Status:  Discontinued        2,000 mg 200 mL/hr over 120 Minutes Intravenous  Once 04/16/20 1701 04/16/20 1702        Subjective: Seen and examined at bedside and he is resting and unable to provide a subjective history.  Appears calm and does have some secretions.  No acute events noted overnight from nursing.  Blood cultures 1 out of 4 grew out staph species and this is likely contaminant.  We will not treat further.  Currently awaiting transfer to residential hospice but if he does not have a bed available soon anticipate in-hospital death being imminent.  Objective: Vitals:   04/16/20 2044 04/17/20 0100 04/17/20 0203 04/17/20 0335  BP:      Pulse:      Resp:      Temp:  (!) 103.2 F (39.6 C)  (!) 102.4 F (39.1 C)  TempSrc:    Axillary  SpO2: 95%  95%   Weight:      Height:        Intake/Output Summary (Last 24 hours) at 04/17/2020 7654 Last data filed at 04/17/2020 0305 Gross per 24 hour  Intake 50 ml  Output 2175 ml  Net -2125 ml   Filed Weights   03/28/2020 1106  Weight: 98.9 kg   Examination: Physical Exam:  Constitutional: Well-nourished, well-developed obese Caucasian male who is appears calm and comfortable resting.  Is unable to provide subjective history and is not awake enough to participate in examination Respiratory: Diminished to auscultation bilaterally with coarse breath sounds and he has some crackles and rhonchi.  He is slightly tachypneic with an elevated respiratory rate and short shallow breathing Cardiovascular: Slightly tachycardic but regular rate.  No appreciable murmurs, rubs, or gallops noted.  Has 1-2+ lower extremity edema Abdomen: Soft, nontender, distended secondary body habitus.  Bowel sounds  are present GU: Deferred Psychiatric: His impaired judgment and insight.  He is not awake and alert and nonverbal to participate in examination  Data Reviewed: I have personally reviewed following labs and imaging studies  CBC: Recent Labs  Lab 03/29/2020 1129 04/01/2020 2325 04/12/20 0540 04/16/20 0853  WBC 6.4 7.8 8.8 8.6  NEUTROABS 3.9  --   --  6.2  HGB 11.5* 12.4* 12.1* 11.9*  HCT 36.0* 38.5* 37.5* 40.0  MCV 82.9 82.6 83.0 87.0  PLT 198 215 205 252   Basic Metabolic Panel: Recent Labs  Lab 04/22/2020 1129 04/10/2020 2209 04/23/2020 2325 04/12/20 0540 04/13/20 0500 04/16/20 0853 04/16/20 1645  NA 136  --   --  141 140 150*  --   K 4.6  --   --  4.9 4.7 4.7  --   CL 103  --   --  106 108 116*  --   CO2 24  --   --  24 19* 23  --   GLUCOSE 210*  --   --  144* 137* 161*  --   BUN 21  --   --  16 21 27*  --   CREATININE 1.18  --  1.03 1.04 1.15 1.32*  --   CALCIUM 9.1  --   --  9.5 9.2 9.0  --   MG  --  1.8  --   --   --  2.0  --   PHOS  --  3.4  --   --   --  4.4 2.8   GFR: Estimated Creatinine Clearance: 54.4 mL/min (A) (by C-G formula based on SCr of 1.32 mg/dL (H)). Liver Function Tests: Recent Labs  Lab 04/10/2020 2209 04/12/20 0540 04/16/20 0853  AST ALT ALKPHOS 90 86 78  BILITOT 0.8 0.8 1.3*  PROT 7.2 6.9 6.1*  ALBUMIN 3.9 3.7 3.0*   No results for input(s): LIPASE, AMYLASE in the last 168 hours. Recent Labs  Lab 04/05/2020 2208  AMMONIA 21   Coagulation Profile: Recent Labs  Lab 04/16/20 1645  INR 1.3*   Cardiac Enzymes: No results for input(s): CKTOTAL, CKMB, CKMBINDEX, TROPONINI in the last 168 hours. BNP (last 3 results) Recent Labs    04/18/19 1510  PROBNP 975*   HbA1C: No results for input(s): HGBA1C in the last 72 hours. CBG: Recent Labs  Lab 04/16/20 0026 04/16/20 0403 04/16/20 0803 04/16/20 1306 04/16/20 1612  GLUCAP 144* 127* 135* 161* 149*   Lipid Profile: No results for input(s): CHOL, HDL, LDLCALC,  TRIG, CHOLHDL, LDLDIRECT in the last 72 hours. Thyroid Function Tests: No results for input(s): TSH, T4TOTAL, FREET4, T3FREE, THYROIDAB in the last 72 hours. Anemia Panel: No results for input(s): VITAMINB12, FOLATE, FERRITIN, TIBC, IRON, RETICCTPCT in the last 72 hours. Sepsis Labs: Recent Labs  Lab 04/16/20 1645 04/16/20 1913  PROCALCITON 0.11  --   LATICACIDVEN 1.4 1.4    Recent Results (from the past 240 hour(s))  Resp Panel by RT-PCR (Flu A&B, Covid) Nasopharyngeal Swab     Status: None   Collection Time: 04/09/2020  9:07 PM   Specimen: Nasopharyngeal Swab; Nasopharyngeal(NP) swabs in vial transport medium  Result Value Ref Range Status   SARS Coronavirus 2 by RT PCR NEGATIVE NEGATIVE Final    Comment: (NOTE) SARS-CoV-2 target nucleic acids are NOT DETECTED.  The SARS-CoV-2 RNA is generally detectable in upper respiratory specimens during the acute phase of infection. The lowest concentration of SARS-CoV-2 viral copies this assay can detect is 138 copies/mL. A negative result does not preclude SARS-Cov-2 infection and should not be used as the sole basis for treatment or other patient management decisions. A negative result may occur with  improper specimen collection/handling, submission of specimen other than nasopharyngeal swab, presence of viral mutation(s) within the areas targeted by this assay, and inadequate number of viral copies(<138 copies/mL). A negative result must be combined with clinical observations, patient history, and epidemiological information. The expected result is Negative.  Fact Sheet for Patients:  BloggerCourse.com  Fact Sheet for Healthcare Providers:  SeriousBroker.it  This test is no t yet approved or cleared by the [place] FDA and  has been authorized for detection and/or diagnosis of SARS-CoV-2 by FDA under an Emergency Use Authorization (EUA). This  EUA will remain  in effect  (meaning this test can be used) for the duration of the COVID-19 declaration under Section 564(b)(1) of the Act, 21 U.S.C.section 360bbb-3(b)(1), unless the authorization is terminated  or revoked sooner.       Influenza A by PCR NEGATIVE NEGATIVE Final   Influenza B by PCR NEGATIVE NEGATIVE Final    Comment: (NOTE) The Xpert Xpress SARS-CoV-2/FLU/RSV plus assay is intended as an aid in the diagnosis of influenza from Nasopharyngeal swab specimens and should not be used as a sole basis for treatment. Nasal washings and aspirates are unacceptable for Xpert Xpress SARS-CoV-2/FLU/RSV testing.  Fact Sheet for Patients: BloggerCourse.com  Fact Sheet for Healthcare Providers: SeriousBroker.it  This test is not yet approved or cleared by the [place] FDA and has been authorized for detection and/or diagnosis of SARS-CoV-2 by FDA under an Emergency Use Authorization (EUA). This EUA will remain in effect (meaning this test can be used) for the duration of the COVID-19 declaration under Section 564(b)(1) of the Act, 21 U.S.C. section 360bbb-3(b)(1), unless the authorization is terminated or revoked.  Performed at [facility], [address] [place], [place] [address]   Culture, blood (routine x 2)     Status: None (Preliminary result)   Collection Time: 04/16/20 10:24 AM   Specimen: BLOOD RIGHT HAND  Result Value Ref Range Status   Specimen Description BLOOD RIGHT HAND  Final   Special Requests   Final    BOTTLES DRAWN AEROBIC AND ANAEROBIC Blood Culture adequate volume   Culture   Final    NO GROWTH <12 HOURS Performed at [facility], [address], [place], [place] [address]    Report Status PENDING  Incomplete     RN Pressure Injury Documentation:     Estimated body mass index is 31.29 kg/m as calculated from the following:   Height as of this encounter: 5\' 10"  (1.778 m).   Weight as of  this encounter: 98.9 kg.  Malnutrition Type:  Nutrition Problem: Inadequate oral intake Etiology:  (altered mental status)   Malnutrition Characteristics:  Signs/Symptoms: NPO status   Nutrition Interventions:  Interventions: Refer to RD note for recommendations     Radiology Studies: MR BRAIN W WO CONTRAST  Result Date: 04/21/2020 CLINICAL DATA:  Mental status change, unknown cause. Multiple recent falls. EXAM: MRI HEAD WITHOUT AND WITH CONTRAST TECHNIQUE: Multiplanar, multiecho pulse sequences of the brain and surrounding structures were obtained without and with intravenous contrast. CONTRAST:  8mL GADAVIST GADOBUTROL 1 MMOL/ML IV SOLN COMPARISON:  Head CT 04/06/2020.  CT angiogram head/neck 08/29/2012. FINDINGS: Brain: Mild cerebral atrophy. Mild periventricular and scattered T2/FLAIR hyperintensity within the cerebral white matter is nonspecific, but compatible with chronic small vessel ischemic disease. Scattered SWI signal loss along the bilateral frontal, parietal, occipital and temporal lobes compatible with chronic blood products and sequela of remote subarachnoid hemorrhage. Nonspecific chronic microhemorrhage within the left cerebellar hemisphere (series 8, image 10). There is no acute infarct. No evidence of intracranial mass. No extra-axial fluid collection. No midline shift. No abnormal intracranial enhancement. Vascular: Expected proximal arterial flow voids. Skull and upper cervical spine: No focal marrow lesion. Sinuses/Orbits: Visualized orbits show no acute finding. Mild paranasal sinus mucosal thickening, most notably ethmoidal. Small mucous retention cysts within the right sphenoid and left maxillary sinuses. IMPRESSION: No evidence of acute intracranial abnormality. Scattered chronic blood products along the bilateral cerebral hemispheres compatible with sequela of remote subarachnoid hemorrhage. Mild cerebral atrophy and chronic  small vessel ischemic disease. Paranasal  sinus disease as described. Electronically Signed   By: Jackey Loge DO   On: 04/12/2020 10:26   DG CHEST PORT 1 VIEW  Result Date: 04/16/2020 CLINICAL DATA:  Atrial fibrillation.  Shortness of breath. EXAM: PORTABLE CHEST 1 VIEW COMPARISON:  04/10/2020 FINDINGS: Chronic cardiomegaly. Pulmonary venous hypertension without frank edema. No infiltrate, collapse or effusion. The patient has not taken a deep inspiration. IMPRESSION: Cardiomegaly and pulmonary venous hypertension. No frank edema. No infiltrate or collapse. Electronically Signed   By: Paulina Fusi M.D.   On: 04/16/2020 10:48   Scheduled Meds: . ipratropium  0.5 mg Nebulization Q6H  . levalbuterol  0.63 mg Nebulization Q6H  . mouth rinse  15 mL Mouth Rinse BID  . scopolamine  1 patch Transdermal Q72H   Continuous Infusions: . sodium chloride    . midazolam    . valproate sodium 250 mg (04/16/20 2343)    LOS: 6 days   Merlene Laughter, DO [facility] PAGER is on AMION  If 7PM-7AM, please contact night-coverage [email or web address]

## 2020-04-17 NOTE — Progress Notes (Signed)
Nutrition Brief Note  Chart reviewed. Pt now transitioning to comfort care.  No further nutrition interventions warranted at this time.  Please re-consult as needed.   Alan Guadarrama, MS, RD, LDN RD pager number and weekend/on-call pager number located in Amion.    

## 2020-04-17 NOTE — Progress Notes (Signed)
Palliative Care Progress Note  Mr. Alan [name] appears agitated and uncomfortable. He  Is unable to meaningfully communicate.Results of his MRI reviewed and prognostic information provided by neurology as well as recommendations for comfort care given neurodegenerative progression. He is unable to take nutrition by mouth.  I spoke with the patient's daughter Alan [name] by phone. She understands his poor prognosis and her main goal for her dad is that he be kept comfortable and have his dignity preserved. She understands that he is facing EOL and that sedation will be required for his safety and comfort.  We discussed comfort measures only, no artifical feeding and she desires a hospice facility for his EOL care and for advanced symptom management needs with LBD.  Recommendations:  1. Transition to comfort care 2. Refer to Hospice IPU 3. Will initiate a continuous infusion of midazolam 2mg /hr, he has responded well to ativan during this admission. 4. Fentanyl for pain PRN, less likely to cause neurotoxic side effects  , DO Palliative Medicine  Time: 40 min Greater than 50%  of this time was spent counseling and coordinating care related to the above assessment and plan.

## 2020-04-18 DIAGNOSIS — Z515 Encounter for palliative care: Secondary | ICD-10-CM

## 2020-04-18 NOTE — Progress Notes (Signed)
PROGRESS NOTE    Alan [name]  YQI:[id number] DOB: Apr 14, 1941 DOA: 04/12/2020 PCP: [facility]   Brief Narrative:The patient is a 79 year old elderly Caucasian male with a past medical history significant for but not limited to type 2 diabetes mellitus on insulin, PTSD, insomnia, hyperlipidemia, nonischemic cardiomyopathy, hypertension, GERD as well as other comorbidities who was brought to the hospital by his girlfriend caregiver with worsening mental status, delirium, agitation and recurrent falls.  Patient is a [place] veteran and has a history of severe insomnia and PTSD.  He has been living at home with his caretaker was noticed worsening symptoms for about a week.  He has been falling multiple times and has been very impulsive and delirious.  Is admitted to the hospital for more work-up and psychiatric symptomatic stabilization.  Patient failed to improve and after subsequent goals of care discussion he was transitioned to comfort care measures and is a very high risk for imminent decompensation and likely even possible hospital death.  He has been transitioned for end-of-life care and residential hospice referral has been made and it is not unexpected for the patient to further decompensate.  Patient spiked a temperature today but would not work this up further given that he is being transitioned to comfort care now. he remains comfortable however it is a very high risk for decompensation and if he is not transitioned to residential hospice imminent in hospital death is expected Assessment & Plan:   Principal Problem:   Terminal care Active Problems:   NICM (nonischemic cardiomyopathy) (HCC)   Hyperlipidemia   Insomnia   Chronic systolic CHF (congestive heart failure) (HCC)   Uncontrolled type 2 diabetes mellitus with hyperglycemia, without long-term current use of insulin (HCC)   Essential hypertension   Delirium   End of life care   #1 acute delirium with  metabolic encephalopathy in the setting of chronic insomnia and PTSD neurology suspecting Lewy body syndrome.  MRI of the brain shows no acute findings.  Patient has not made any improvement since admission.  Patient is now comfort care which is appropriate.  Discussed with his significant other at bedside.  Patient was admitted with multiple falls and deconditioning.  #2 chronic atrial fibrillation  #3 goals of care patient made comfort care after hospital admission since work-up did not reveal anything reversible and his mental status continued to worsen.  Family decided to make him comfortable.  No bed at [facility] today  #4 type 2 diabetes on insulin at home   Nutrition Problem: Inadequate oral intake Etiology:  (altered mental status)     Signs/Symptoms: NPO status    Interventions: Refer to RD note for recommendations  Estimated body mass index is 31.29 kg/m as calculated from the following:   Height as of this encounter:  (1.778 m).   Weight as of this encounter: 98.9 kg.  DVT prophylaxis: None Code Status: DO NOT RESUSCITATE Family Communication: Discussed with significant other/caregiver/girlfriend Disposition Plan:  Status is: Inpatient  Dispo: The patient is from: Home              Anticipated d/c is to: Expecting hospital death              Anticipated d/c date is: 2 days              Patient currently is not medically stable to d/c.    Consultants:   Neurology and palliative care  Procedures: MRI Antimicrobials: None  Subjective: Patient seen at  bedside he is resting in no acute distress  Objective: Vitals:   04/17/20 2125 04/18/20 0121 04/18/20 0410 04/18/20 1404  BP:    123/60  Pulse:    100  Resp:    20  Temp: (!) 103.5 F (39.7 C)  (!) 103.1 F (39.5 C) (!) 101.4 F (38.6 C)  TempSrc: Axillary  Axillary   SpO2:  96%    Weight:      Height:        Intake/Output Summary (Last 24 hours) at 04/18/2020 1450 Last data filed at  04/18/2020 0330 Gross per 24 hour  Intake 50 ml  Output 400 ml  Net -350 ml   Filed Weights   04/12/2020 1106  Weight: 98.9 kg    Examination:  General exam: Appears calm and comfortable  Respiratory system: Clear to auscultation. Respiratory effort normal. Cardiovascular system: S1 & S2 heard, RRR. No JVD, murmurs, rubs, gallops or clicks. No pedal edema. Gastrointestinal system: Abdomen is nondistended, soft and nontender. No organomegaly or masses felt. Normal bowel sounds heard. Central nervous system unable to assess  extremities: Symmetric 5 x 5 power. Skin: No rashes, lesions or ulcers Psychiatry: Unable to assess    Data Reviewed: I have personally reviewed following labs and imaging studies  CBC: Recent Labs  Lab April 12, 2020 2325 04/12/20 0540 04/16/20 0853  WBC 7.8 8.8 8.6  NEUTROABS  --   --  6.2  HGB 12.4* 12.1* 11.9*  HCT 38.5* 37.5* 40.0  MCV 82.6 83.0 87.0  PLT 215 205 252   Basic Metabolic Panel: Recent Labs  Lab 04-12-20 2209 04-12-20 2325 04/12/20 0540 04/13/20 0500 04/16/20 0853 04/16/20 1645  NA  --   --  141 140 150*  --   K  --   --  4.9 4.7 4.7  --   CL  --   --  106 108 116*  --   CO2  --   --  24 19* 23  --   GLUCOSE  --   --  144* 137* 161*  --   BUN  --   --  16 21 27*  --   CREATININE  --  1.03 1.04 1.15 1.32*  --   CALCIUM  --   --  9.5 9.2 9.0  --   MG 1.8  --   --   --  2.0  --   PHOS 3.4  --   --   --  4.4 2.8   GFR: Estimated Creatinine Clearance: 54.4 mL/min (A) (by C-G formula based on SCr of 1.32 mg/dL (H)). Liver Function Tests: Recent Labs  Lab 04/12/20 2209 04/12/20 0540 04/16/20 0853  AST 20 22 23   ALT 20 18 19   ALKPHOS 90 86 78  BILITOT 0.8 0.8 1.3*  PROT 7.2 6.9 6.1*  ALBUMIN 3.9 3.7 3.0*   No results for input(s): LIPASE, AMYLASE in the last 168 hours. Recent Labs  Lab Apr 12, 2020 2208  AMMONIA 21   Coagulation Profile: Recent Labs  Lab 04/16/20 1645  INR 1.3*   Cardiac Enzymes: No results for  input(s): CKTOTAL, CKMB, CKMBINDEX, TROPONINI in the last 168 hours. BNP (last 3 results) No results for input(s): PROBNP in the last 8760 hours. HbA1C: No results for input(s): HGBA1C in the last 72 hours. CBG: Recent Labs  Lab 04/16/20 0026 04/16/20 0403 04/16/20 0803 04/16/20 1306 04/16/20 1612  GLUCAP 144* 127* 135* 161* 149*   Lipid Profile: No results for input(s): CHOL, HDL, LDLCALC, TRIG, CHOLHDL,  LDLDIRECT in the last 72 hours. Thyroid Function Tests: No results for input(s): TSH, T4TOTAL, FREET4, T3FREE, THYROIDAB in the last 72 hours. Anemia Panel: No results for input(s): VITAMINB12, FOLATE, FERRITIN, TIBC, IRON, RETICCTPCT in the last 72 hours. Sepsis Labs: Recent Labs  Lab 04/16/20 1645 04/16/20 1913  PROCALCITON 0.11  --   LATICACIDVEN 1.4 1.4    Recent Results (from the past 240 hour(s))  Resp Panel by RT-PCR (Flu A&B, Covid) Nasopharyngeal Swab     Status: None   Collection Time: 05/07/2020  9:07 PM   Specimen: Nasopharyngeal Swab; Nasopharyngeal(NP) swabs in vial transport medium  Result Value Ref Range Status   SARS Coronavirus 2 by RT PCR NEGATIVE NEGATIVE Final    Comment: (NOTE) SARS-CoV-2 target nucleic acids are NOT DETECTED.  The SARS-CoV-2 RNA is generally detectable in upper respiratory specimens during the acute phase of infection. The lowest concentration of SARS-CoV-2 viral copies this assay can detect is 138 copies/mL. A negative result does not preclude SARS-Cov-2 infection and should not be used as the sole basis for treatment or other patient management decisions. A negative result may occur with  improper specimen collection/handling, submission of specimen other than nasopharyngeal swab, presence of viral mutation(s) within the areas targeted by this assay, and inadequate number of viral copies(<138 copies/mL). A negative result must be combined with clinical observations, patient history, and epidemiological information. The  expected result is Negative.  Fact Sheet for Patients:  BloggerCourse.com  Fact Sheet for Healthcare Providers:  SeriousBroker.it  This test is no t yet approved or cleared by the [place] FDA and  has been authorized for detection and/or diagnosis of SARS-CoV-2 by FDA under an Emergency Use Authorization (EUA). This EUA will remain  in effect (meaning this test can be used) for the duration of the COVID-19 declaration under Section 564(b)(1) of the Act, 21 U.S.C.section 360bbb-3(b)(1), unless the authorization is terminated  or revoked sooner.       Influenza A by PCR NEGATIVE NEGATIVE Final   Influenza B by PCR NEGATIVE NEGATIVE Final    Comment: (NOTE) The Xpert Xpress SARS-CoV-2/FLU/RSV plus assay is intended as an aid in the diagnosis of influenza from Nasopharyngeal swab specimens and should not be used as a sole basis for treatment. Nasal washings and aspirates are unacceptable for Xpert Xpress SARS-CoV-2/FLU/RSV testing.  Fact Sheet for Patients: BloggerCourse.com  Fact Sheet for Healthcare Providers: SeriousBroker.it  This test is not yet approved or cleared by the [place] FDA and has been authorized for detection and/or diagnosis of SARS-CoV-2 by FDA under an Emergency Use Authorization (EUA). This EUA will remain in effect (meaning this test can be used) for the duration of the COVID-19 declaration under Section 564(b)(1) of the Act, 21 U.S.C. section 360bbb-3(b)(1), unless the authorization is terminated or revoked.  Performed at [facility], [address], [place], [place] [address]   Culture, blood (routine x 2)     Status: None (Preliminary result)   Collection Time: 04/16/20 10:24 AM   Specimen: BLOOD RIGHT HAND  Result Value Ref Range Status   Specimen Description BLOOD RIGHT HAND  Final   Special Requests   Final     BOTTLES DRAWN AEROBIC AND ANAEROBIC Blood Culture adequate volume   Culture   Final    NO GROWTH < 24 HOURS Performed at [facility], [address], [place], [place] [address]    Report Status PENDING  Incomplete  Culture, blood (routine x 2)  Status: Abnormal (Preliminary result)   Collection Time: 04/16/20 10:24 AM   Specimen: BLOOD  Result Value Ref Range Status   Specimen Description BLOOD RIGHT ANTECUBITAL  Final   Special Requests   Final    BOTTLES DRAWN AEROBIC AND ANAEROBIC Blood Culture adequate volume   Culture  Setup Time   Final    GRAM POSITIVE COCCI IN CLUSTERS ANAEROBIC BOTTLE ONLY Organism ID to follow CRITICAL RESULT CALLED TO, READ BACK BY AND VERIFIED WITH: Norva Riffle PharmD 9:40 04/17/20 ([email or web address])    Culture (A)  Final    STAPHYLOCOCCUS HOMINIS THE SIGNIFICANCE OF ISOLATING THIS ORGANISM FROM A SINGLE SET OF BLOOD CULTURES WHEN MULTIPLE SETS ARE DRAWN IS UNCERTAIN. PLEASE NOTIFY THE MICROBIOLOGY DEPARTMENT WITHIN ONE WEEK IF SPECIATION AND SENSITIVITIES ARE REQUIRED. Performed at [facility], [address] [place], [place] [address]    Report Status PENDING  Incomplete  Blood Culture ID Panel (Reflexed)     Status: Abnormal   Collection Time: 04/16/20 10:24 AM  Result Value Ref Range Status   Enterococcus faecalis NOT DETECTED NOT DETECTED Final   Enterococcus Faecium NOT DETECTED NOT DETECTED Final   Listeria monocytogenes NOT DETECTED NOT DETECTED Final   Staphylococcus species DETECTED (A) NOT DETECTED Final    Comment: CRITICAL RESULT CALLED TO, READ BACK BY AND VERIFIED WITH: Norva Riffle PharmD 9:40 04/17/20 ([email or web address])    Staphylococcus aureus (BCID) NOT DETECTED NOT DETECTED Final   Staphylococcus epidermidis NOT DETECTED NOT DETECTED Final   Staphylococcus lugdunensis NOT DETECTED NOT DETECTED Final   Streptococcus species NOT DETECTED NOT DETECTED Final   Streptococcus agalactiae NOT DETECTED NOT DETECTED Final   Streptococcus  pneumoniae NOT DETECTED NOT DETECTED Final   Streptococcus pyogenes NOT DETECTED NOT DETECTED Final   A.calcoaceticus-baumannii NOT DETECTED NOT DETECTED Final   Bacteroides fragilis NOT DETECTED NOT DETECTED Final   Enterobacterales NOT DETECTED NOT DETECTED Final   Enterobacter cloacae complex NOT DETECTED NOT DETECTED Final   Escherichia coli NOT DETECTED NOT DETECTED Final   Klebsiella aerogenes NOT DETECTED NOT DETECTED Final   Klebsiella oxytoca NOT DETECTED NOT DETECTED Final   Klebsiella pneumoniae NOT DETECTED NOT DETECTED Final   Proteus species NOT DETECTED NOT DETECTED Final   Salmonella species NOT DETECTED NOT DETECTED Final   Serratia marcescens NOT DETECTED NOT DETECTED Final   Haemophilus influenzae NOT DETECTED NOT DETECTED Final   Neisseria meningitidis NOT DETECTED NOT DETECTED Final   Pseudomonas aeruginosa NOT DETECTED NOT DETECTED Final   Stenotrophomonas maltophilia NOT DETECTED NOT DETECTED Final   Candida albicans NOT DETECTED NOT DETECTED Final   Candida auris NOT DETECTED NOT DETECTED Final   Candida glabrata NOT DETECTED NOT DETECTED Final   Candida krusei NOT DETECTED NOT DETECTED Final   Candida parapsilosis NOT DETECTED NOT DETECTED Final   Candida tropicalis NOT DETECTED NOT DETECTED Final   Cryptococcus neoformans/gattii NOT DETECTED NOT DETECTED Final    Comment: Performed at [facility], [address], [place], [place] [address]         Radiology Studies: No results found.      Scheduled Meds: . ipratropium  0.5 mg Nebulization Q6H  . levalbuterol  0.63 mg Nebulization Q6H  . mouth rinse  15 mL Mouth Rinse BID  . scopolamine  1 patch Transdermal Q72H   Continuous Infusions: . sodium chloride    . midazolam    . valproate sodium 250 mg (04/18/20 1136)     LOS: 7 days  Alwyn Ren, MD 04/18/2020, 2:50 PM

## 2020-04-18 NOTE — Progress Notes (Signed)
[facility] ([facility]) Hospital Liaison note.    [facility] is unable to offer a room today. Hospital Liaison will follow up tomorrow or sooner if a room becomes available. Please do not hesitate to call with questions.    Thank you for the opportunity to participate in this patient's care.  Chrislyn King, BSN, RN [facility] Hospital Liaison (listed on AMION under Hospice/[facility])    [phone]  

## 2020-04-19 DIAGNOSIS — Z7189 Other specified counseling: Secondary | ICD-10-CM

## 2020-04-19 LAB — CULTURE, BLOOD (ROUTINE X 2): Special Requests: ADEQUATE

## 2020-04-19 MED ORDER — LEVALBUTEROL HCL 0.63 MG/3ML IN NEBU
0.6300 mg | INHALATION_SOLUTION | Freq: Two times a day (BID) | RESPIRATORY_TRACT | Status: DC
  Administered 2020-04-19: 0.63 mg via RESPIRATORY_TRACT
  Filled 2020-04-19: qty 3

## 2020-04-19 MED ORDER — IPRATROPIUM BROMIDE 0.02 % IN SOLN
0.5000 mg | Freq: Two times a day (BID) | RESPIRATORY_TRACT | Status: DC
  Administered 2020-04-19: 0.5 mg via RESPIRATORY_TRACT
  Filled 2020-04-19: qty 2.5

## 2020-04-21 ENCOUNTER — Ambulatory Visit (HOSPITAL_COMMUNITY): Payer: Medicare Other

## 2020-04-21 LAB — CULTURE, BLOOD (ROUTINE X 2)
Culture: NO GROWTH
Special Requests: ADEQUATE

## 2020-04-26 NOTE — Progress Notes (Signed)
° °  Palliative Medicine Inpatient Follow Up Note  HPI: The patient is a 80 year old elderly Caucasian male with a past medical history significant for but not limited to type 2 diabetes mellitus on insulin, PTSD, insomnia, hyperlipidemia, nonischemic cardiomyopathy, hypertension, GERD as well as other comorbidities who was brought to the hospital by his girlfriend caregiver with worsening mental status, delirium, agitation and recurrent falls. Patient is a [place] veteran and has a history of severe insomnia and PTSD. He has been living at home with his caretaker was noticed worsening symptoms for about a week. He has been falling multiple times and has been very impulsive and delirious. Is admitted to the hospital for more work-up and psychiatric symptomatic stabilization. Has experienced ongoing decline. Palliative care consulted and patient transitioned to end of life care.   Today's Discussion (May 11, 2020): Chart reviewed.  I met with patient's significant other Sherol at bedside.  She is reasonably emotional during our time together.  She shares that this is hard and her goal is to not see Alan [name] suffer any longer.  I told Malachy Mood that among all else we will, to the best of our abilities, make every attempt to alleviate any physical suffering he may experience. She was reassured by this. She shares that she noted his respirations are becoming more irregular. We reviewed the different processes that Baruch will experience as his organs start to shut down. Provided "Gone From My Site" booklet as a point of reference.   Questions and concerns addressed   Emotional support provided through therapeutic listening.   Objective Assessment: Vital Signs Vitals:   04/18/20 2152 05/11/2020 0126  BP:    Pulse:    Resp:    Temp: (!) 102.9 F (39.4 C)   SpO2:  93%    Intake/Output Summary (Last 24 hours) at 2020-05-11 0741 Last data filed at 04/18/2020 2311 Gross per 24 hour  Intake --  Output  1150 ml  Net -1150 ml   Last Weight  Most recent update: 04/04/2020 11:06 AM   Weight  98.9 kg (218 lb 1.6 oz)           Gen:  Elderly M in NAD HEENT: Dry mucous membranes CV: Regular rate and rhythm  PULM: clear to auscultation bilaterally  ABD: Lower abdominal distention GU: Foley in place EXT: Cool feet and hands Neuro: Somnolent  SUMMARY OF RECOMMENDATIONS   DNAR/DNI  Liberalize visitation  Comfort care  Medication per [facility]  Spiritual support is appreciated  Ongoing palliative care support  Time Spent: 25 Greater than 50% of the time was spent in counseling and coordination of care ______________________________________________________________________________________ [place] Team Team Cell Phone: [phone] Please utilize secure chat with additional questions, if there is no response within 30 minutes please call the above phone number  Palliative Medicine Team providers are available by phone from 7am to 7pm daily and can be reached through the team cell phone.  Should this patient require assistance outside of these hours, please call the patient's attending physician.

## 2020-04-26 NOTE — Plan of Care (Signed)
  Problem: Coping: Goal: Level of anxiety will decrease Outcome: Progressing   Problem: Elimination: Goal: Will not experience complications related to bowel motility Outcome: Progressing Goal: Will not experience complications related to urinary retention Outcome: Progressing   Problem: Nutrition: Goal: Adequate nutrition will be maintained May 11, 2020 0106 by Dorena Cookey, LPN Outcome: Not Met (add Reason) Note: Comfort care May 11, 2020 0105 by Dorena Cookey, LPN Outcome: Progressing   Problem: Elimination: Goal: Will not experience complications related to bowel motility Outcome: Progressing Goal: Will not experience complications related to urinary retention Outcome: Progressing   Problem: Pain Managment: Goal: General experience of comfort will improve Outcome: Progressing   Problem: Safety: Goal: Ability to remain free from injury will improve Outcome: Progressing   Problem: Skin Integrity: Goal: Risk for impaired skin integrity will decrease Outcome: Progressing

## 2020-04-26 NOTE — Progress Notes (Signed)
[facility] ([facility]) Hospital Liaison note.   ° °[facility] is unable to offer a room today. Hospice liaison will update with any change. Please do not hesitate to call with questions.     °A Please do not hesitate to call with questions.     ° °Thank you,     °Mary Anne Robertson, RN, CCM        °[facility] Hospital Liaison (listed on AMION under [facility]ospice /[facility])      °[phone]  °

## 2020-04-26 NOTE — Progress Notes (Signed)
PROGRESS NOTE    Alan [name]  OIN:[id number] DOB: March 10, 1941 DOA: 04/08/2020 PCP: [facility]   Brief Narrative:The patient is a 80 year old elderly Caucasian male with a past medical history significant for but not limited to type 2 diabetes mellitus on insulin, PTSD, insomnia, hyperlipidemia, nonischemic cardiomyopathy, hypertension, GERD as well as other comorbidities who was brought to the hospital by his girlfriend caregiver with worsening mental status, delirium, agitation and recurrent falls.  Patient is a [place] veteran and has a history of severe insomnia and PTSD.  He has been living at home with his caretaker was noticed worsening symptoms for about a week.  He has been falling multiple times and has been very impulsive and delirious.  Is admitted to the hospital for more work-up and psychiatric symptomatic stabilization.  Patient failed to improve and after subsequent goals of care discussion he was transitioned to comfort care measures and is a very high risk for imminent decompensation and likely even possible hospital death.  He has been transitioned for end-of-life care and residential hospice referral has been made and it is not unexpected for the patient to further decompensate.  Patient spiked a temperature today but would not work this up further given that he is being transitioned to comfort care now. he remains comfortable however it is a very high risk for decompensation and if he is not transitioned to residential hospice imminent in hospital death is expected Assessment & Plan:   Principal Problem:   Terminal care Active Problems:   NICM (nonischemic cardiomyopathy) (HCC)   Hyperlipidemia   Insomnia   Chronic systolic CHF (congestive heart failure) (HCC)   Uncontrolled type 2 diabetes mellitus with hyperglycemia, without long-term current use of insulin (HCC)   Essential hypertension   Delirium   End of life care   #1 acute delirium with  metabolic encephalopathy in the setting of chronic insomnia and PTSD neurology suspecting Lewy body syndrome.  MRI of the brain shows no acute findings.  Patient has not made any improvement since admission.  Patient is now comfort care which is appropriate.  Discussed with his significant other at bedside.  Patient was admitted with multiple falls and deconditioning.  #2 chronic atrial fibrillation  #3 goals of care patient made comfort care after hospital admission since work-up did not reveal anything reversible and his mental status continued to worsen.  Family decided to make him comfortable.  No bed at [facility]  Hospital death expected.   #4 type 2 diabetes on insulin at home   Nutrition Problem: Inadequate oral intake Etiology:  (altered mental status)     Signs/Symptoms: NPO status    Interventions: Refer to RD note for recommendations  Estimated body mass index is 31.29 kg/m as calculated from the following:   Height as of this encounter: 5\' 10"  (1.778 m).   Weight as of this encounter: 98.9 kg.  DVT prophylaxis: None Code Status: DO NOT RESUSCITATE Family Communication: Discussed with significant other/caregiver/girlfriend Disposition Plan:  Status is: Inpatient  Dispo: The patient is from: Home              Anticipated d/c is to: Expecting hospital death              Anticipated d/c date is: 2 days              Patient currently is not medically stable to d/c.    Consultants:   Neurology and palliative care  Procedures: MRI Antimicrobials: None  Subjective: Patient seen at bedside he is resting in no acute distress He does appear to be in any acute distres Had fever of 102.9  Objective: Vitals:   04/18/20 2019 04/18/20 2152 04/08/2020 0126 03/28/2020 0754  BP:      Pulse:    (!) 104  Resp:    16  Temp:  (!) 102.9 F (39.4 C)    TempSrc:  Axillary    SpO2: 97%  93% 92%  Weight:      Height:        Intake/Output Summary (Last 24 hours) at  04/02/2020 4825 Last data filed at 04/18/2020 2311 Gross per 24 hour  Intake --  Output 1150 ml  Net -1150 ml   Filed Weights   04/02/2020 1106  Weight: 98.9 kg    Examination:  General exam: Appears calm and comfortable  Respiratory system: Clear to auscultation. Respiratory effort normal. Cardiovascular system: S1 & S2 heard, RRR. No JVD, murmurs, rubs, gallops or clicks. No pedal edema. Gastrointestinal system: Abdomen is nondistended, soft and nontender. No organomegaly or masses felt. Normal bowel sounds heard. Central nervous system unable to assess  extremities: Symmetric 5 x 5 power. Skin: No rashes, lesions or ulcers Psychiatry: Unable to assess    Data Reviewed: I have personally reviewed following labs and imaging studies  CBC: Recent Labs  Lab 04/16/20 0853  WBC 8.6  NEUTROABS 6.2  HGB 11.9*  HCT 40.0  MCV 87.0  PLT 252   Basic Metabolic Panel: Recent Labs  Lab 04/13/20 0500 04/16/20 0853 04/16/20 1645  NA 140 150*  --   K 4.7 4.7  --   CL 108 116*  --   CO2 19* 23  --   GLUCOSE 137* 161*  --   BUN 21 27*  --   CREATININE 1.15 1.32*  --   CALCIUM 9.2 9.0  --   MG  --  2.0  --   PHOS  --  4.4 2.8   GFR: Estimated Creatinine Clearance: 54.4 mL/min (A) (by C-G formula based on SCr of 1.32 mg/dL (H)). Liver Function Tests: Recent Labs  Lab 04/16/20 0853  AST 23  ALT 19  ALKPHOS 78  BILITOT 1.3*  PROT 6.1*  ALBUMIN 3.0*   No results for input(s): LIPASE, AMYLASE in the last 168 hours. No results for input(s): AMMONIA in the last 168 hours. Coagulation Profile: Recent Labs  Lab 04/16/20 1645  INR 1.3*   Cardiac Enzymes: No results for input(s): CKTOTAL, CKMB, CKMBINDEX, TROPONINI in the last 168 hours. BNP (last 3 results) No results for input(s): PROBNP in the last 8760 hours. HbA1C: No results for input(s): HGBA1C in the last 72 hours. CBG: Recent Labs  Lab 04/16/20 0026 04/16/20 0403 04/16/20 0803 04/16/20 1306  04/16/20 1612  GLUCAP 144* 127* 135* 161* 149*   Lipid Profile: No results for input(s): CHOL, HDL, LDLCALC, TRIG, CHOLHDL, LDLDIRECT in the last 72 hours. Thyroid Function Tests: No results for input(s): TSH, T4TOTAL, FREET4, T3FREE, THYROIDAB in the last 72 hours. Anemia Panel: No results for input(s): VITAMINB12, FOLATE, FERRITIN, TIBC, IRON, RETICCTPCT in the last 72 hours. Sepsis Labs: Recent Labs  Lab 04/16/20 1645 04/16/20 1913  PROCALCITON 0.11  --   LATICACIDVEN 1.4 1.4    Recent Results (from the past 240 hour(s))  Resp Panel by RT-PCR (Flu A&B, Covid) Nasopharyngeal Swab     Status: None   Collection Time: 04/05/2020  9:07 PM   Specimen: Nasopharyngeal Swab; Nasopharyngeal(NP) swabs in  vial transport medium  Result Value Ref Range Status   SARS Coronavirus 2 by RT PCR NEGATIVE NEGATIVE Final    Comment: (NOTE) SARS-CoV-2 target nucleic acids are NOT DETECTED.  The SARS-CoV-2 RNA is generally detectable in upper respiratory specimens during the acute phase of infection. The lowest concentration of SARS-CoV-2 viral copies this assay can detect is 138 copies/mL. A negative result does not preclude SARS-Cov-2 infection and should not be used as the sole basis for treatment or other patient management decisions. A negative result may occur with  improper specimen collection/handling, submission of specimen other than nasopharyngeal swab, presence of viral mutation(s) within the areas targeted by this assay, and inadequate number of viral copies(<138 copies/mL). A negative result must be combined with clinical observations, patient history, and epidemiological information. The expected result is Negative.  Fact Sheet for Patients:  BloggerCourse.com  Fact Sheet for Healthcare Providers:  SeriousBroker.it  This test is no t yet approved or cleared by the [place] FDA and  has been authorized for detection and/or  diagnosis of SARS-CoV-2 by FDA under an Emergency Use Authorization (EUA). This EUA will remain  in effect (meaning this test can be used) for the duration of the COVID-19 declaration under Section 564(b)(1) of the Act, 21 U.S.C.section 360bbb-3(b)(1), unless the authorization is terminated  or revoked sooner.       Influenza A by PCR NEGATIVE NEGATIVE Final   Influenza B by PCR NEGATIVE NEGATIVE Final    Comment: (NOTE) The Xpert Xpress SARS-CoV-2/FLU/RSV plus assay is intended as an aid in the diagnosis of influenza from Nasopharyngeal swab specimens and should not be used as a sole basis for treatment. Nasal washings and aspirates are unacceptable for Xpert Xpress SARS-CoV-2/FLU/RSV testing.  Fact Sheet for Patients: BloggerCourse.com  Fact Sheet for Healthcare Providers: SeriousBroker.it  This test is not yet approved or cleared by the [place] FDA and has been authorized for detection and/or diagnosis of SARS-CoV-2 by FDA under an Emergency Use Authorization (EUA). This EUA will remain in effect (meaning this test can be used) for the duration of the COVID-19 declaration under Section 564(b)(1) of the Act, 21 U.S.C. section 360bbb-3(b)(1), unless the authorization is terminated or revoked.  Performed at [facility], [address] [place], [place] [address]   Culture, blood (routine x 2)     Status: None (Preliminary result)   Collection Time: 04/16/20 10:24 AM   Specimen: BLOOD RIGHT HAND  Result Value Ref Range Status   Specimen Description BLOOD RIGHT HAND  Final   Special Requests   Final    BOTTLES DRAWN AEROBIC AND ANAEROBIC Blood Culture adequate volume   Culture   Final    NO GROWTH 2 DAYS Performed at [facility], [address] [place], [place] [address]    Report Status PENDING  Incomplete  Culture, blood (routine x 2)     Status: Abnormal   Collection Time: 04/16/20  10:24 AM   Specimen: BLOOD  Result Value Ref Range Status   Specimen Description BLOOD RIGHT ANTECUBITAL  Final   Special Requests   Final    BOTTLES DRAWN AEROBIC AND ANAEROBIC Blood Culture adequate volume   Culture  Setup Time   Final    GRAM POSITIVE COCCI IN CLUSTERS ANAEROBIC BOTTLE ONLY Organism ID to follow CRITICAL RESULT CALLED TO, READ BACK BY AND VERIFIED WITH: Norva Riffle PharmD 9:40 04/17/20 ([email or web address])    Culture (A)  Final    STAPHYLOCOCCUS HOMINIS THE  SIGNIFICANCE OF ISOLATING THIS ORGANISM FROM A SINGLE SET OF BLOOD CULTURES WHEN MULTIPLE SETS ARE DRAWN IS UNCERTAIN. PLEASE NOTIFY THE MICROBIOLOGY DEPARTMENT WITHIN ONE WEEK IF SPECIATION AND SENSITIVITIES ARE REQUIRED. Performed at [facility], [address], [place], [place] [address]    Report Status 04/18/2020 FINAL  Final  Blood Culture ID Panel (Reflexed)     Status: Abnormal   Collection Time: 04/16/20 10:24 AM  Result Value Ref Range Status   Enterococcus faecalis NOT DETECTED NOT DETECTED Final   Enterococcus Faecium NOT DETECTED NOT DETECTED Final   Listeria monocytogenes NOT DETECTED NOT DETECTED Final   Staphylococcus species DETECTED (A) NOT DETECTED Final    Comment: CRITICAL RESULT CALLED TO, READ BACK BY AND VERIFIED WITH: Norva Riffle PharmD 9:40 04/17/20 ([email or web address])    Staphylococcus aureus (BCID) NOT DETECTED NOT DETECTED Final   Staphylococcus epidermidis NOT DETECTED NOT DETECTED Final   Staphylococcus lugdunensis NOT DETECTED NOT DETECTED Final   Streptococcus species NOT DETECTED NOT DETECTED Final   Streptococcus agalactiae NOT DETECTED NOT DETECTED Final   Streptococcus pneumoniae NOT DETECTED NOT DETECTED Final   Streptococcus pyogenes NOT DETECTED NOT DETECTED Final   A.calcoaceticus-baumannii NOT DETECTED NOT DETECTED Final   Bacteroides fragilis NOT DETECTED NOT DETECTED Final   Enterobacterales NOT DETECTED NOT DETECTED Final   Enterobacter cloacae complex NOT DETECTED NOT  DETECTED Final   Escherichia coli NOT DETECTED NOT DETECTED Final   Klebsiella aerogenes NOT DETECTED NOT DETECTED Final   Klebsiella oxytoca NOT DETECTED NOT DETECTED Final   Klebsiella pneumoniae NOT DETECTED NOT DETECTED Final   Proteus species NOT DETECTED NOT DETECTED Final   Salmonella species NOT DETECTED NOT DETECTED Final   Serratia marcescens NOT DETECTED NOT DETECTED Final   Haemophilus influenzae NOT DETECTED NOT DETECTED Final   Neisseria meningitidis NOT DETECTED NOT DETECTED Final   Pseudomonas aeruginosa NOT DETECTED NOT DETECTED Final   Stenotrophomonas maltophilia NOT DETECTED NOT DETECTED Final   Candida albicans NOT DETECTED NOT DETECTED Final   Candida auris NOT DETECTED NOT DETECTED Final   Candida glabrata NOT DETECTED NOT DETECTED Final   Candida krusei NOT DETECTED NOT DETECTED Final   Candida parapsilosis NOT DETECTED NOT DETECTED Final   Candida tropicalis NOT DETECTED NOT DETECTED Final   Cryptococcus neoformans/gattii NOT DETECTED NOT DETECTED Final    Comment: Performed at [facility], [address] [place], [place] [address]         Radiology Studies: No results found.      Scheduled Meds: . ipratropium  0.5 mg Nebulization BID  . levalbuterol  0.63 mg Nebulization BID  . mouth rinse  15 mL Mouth Rinse BID  . scopolamine  1 patch Transdermal Q72H   Continuous Infusions: . sodium chloride    . midazolam       LOS: 8 days   Alwyn Ren, MD 04/06/2020, 9:22 AM

## 2020-04-26 NOTE — Death Summary Note (Signed)
Death Summary  Brianna Esson ZOX:[id number] DOB: 1940-06-17 DOA: 05/06/2020  PCP: [facility]  Admit date: May 06, 2020 Date of Death: 2020/05/16 Time of Death: 3 pm Notification: [facility] notified of death of 05-16-20   History of present illness: The patient is a 80 year old elderly Caucasian male with a past medical history significant for but not limited to type 2 diabetes mellitus on insulin, PTSD, insomnia, hyperlipidemia, nonischemic cardiomyopathy, hypertension, GERD as well as other comorbidities who was brought to the hospital by his girlfriend caregiver with worsening mental status, delirium, agitation and recurrent falls. Patient is a [place] veteran and has a history of severe insomnia and PTSD. He has been living at home with his caretaker was noticed worsening symptoms for about a week. He has been falling multiple times and has been very impulsive and delirious. Is admitted to the hospital for more work-up and psychiatric symptomatic stabilization. Patient failed to improve and after subsequent goals of care discussion he was transitioned to comfort care measures and is a very high risk for imminent decompensation and likely even possible hospital death. He has been transitioned for end-of-life care and residential hospice referral has been made and it is not unexpected for the patient to further decompensate. Patient spiked a temperature today but would not work this up further given that he is being transitioned to comfort care now. he remains comfortable however it is a very high risk for decompensation and if he is not transitioned to residential hospiceimminent in hospital death is expected  Final Diagnoses:  1.  End stage dementia 2 Atrial fibrillation   The results of significant diagnostics from this hospitalization (including imaging, microbiology, ancillary and laboratory) are listed below for reference.    Significant  Diagnostic Studies: CT Head Wo Contrast  Result Date: 2020/05/06 CLINICAL DATA:  Facial trauma. Neck trauma. Additional history provided: Multiple falls over the past week hitting head, last fall 3 days ago, headache, new difficulty hearing. EXAM: CT HEAD WITHOUT CONTRAST CT CERVICAL SPINE WITHOUT CONTRAST TECHNIQUE: Multidetector CT imaging of the head and cervical spine was performed following the standard protocol without intravenous contrast. Multiplanar CT image reconstructions of the cervical spine were also generated. COMPARISON:  CT head 08/29/2012. Radiographs of the cervical spine 08/29/2012. FINDINGS: CT HEAD FINDINGS Brain: Mild cerebral atrophy. There is no acute intracranial hemorrhage. No demarcated cortical infarct. No extra-axial fluid collection. No evidence of intracranial mass. No midline shift. Vascular: No hyperdense vessel.  Atherosclerotic calcifications. Skull: Normal. Negative for fracture or focal lesion. Sinuses/Orbits: Visualized orbits show no acute finding. Mild frontal, ethmoid and left maxillary sinus mucosal thickening. Small mucous retention cysts within the right sphenoid and left maxillary sinuses. Other: Small left parietal scalp hematoma (series 4, image 30). CT CERVICAL SPINE FINDINGS Alignment: Mild reversal of the expected cervical lordosis. 3 mm C3-C4 grade 1 anterolisthesis. Skull base and vertebrae: The basion-dental and atlanto-dental intervals are maintained.No evidence of acute fracture to the cervical spine. Soft tissues and spinal canal: No prevertebral fluid or swelling. No visible canal hematoma. Disc levels: Cervical spondylosis with multilevel disc space narrowing, disc bulges, uncovertebral hypertrophy and facet arthrosis. Fusion across the anterior and posterior disc space at C2-C3 and C4-C5. Facet joint ankylosis bilaterally at C2-C3 and on the left at C4-C5. Upper chest: No consolidation within the imaged lung apices. No visible pneumothorax. Other: 2.1  cm right thyroid lobe nodule. IMPRESSION: CT head: 1. No evidence of acute intracranial abnormality. 2. Small left parietal scalp hematoma. 3. Mild  cerebral atrophy. 4. Mild paranasal sinus disease as described. CT cervical spine: 1. No evidence of acute fracture to the cervical spine. 2. C3-C4 grade 1 anterolisthesis. 3. Nonspecific reversal of the expected cervical lordosis. 4. Cervical spondylosis as described. C2-C3 and C4-C5 fusion. 5. Previously biopsied 2.1 cm right thyroid lobe nodule. Electronically Signed   By: Jackey Loge DO   On: 04/23/2020 12:10   CT Cervical Spine Wo Contrast  Result Date: 04/10/2020 CLINICAL DATA:  Facial trauma. Neck trauma. Additional history provided: Multiple falls over the past week hitting head, last fall 3 days ago, headache, new difficulty hearing. EXAM: CT HEAD WITHOUT CONTRAST CT CERVICAL SPINE WITHOUT CONTRAST TECHNIQUE: Multidetector CT imaging of the head and cervical spine was performed following the standard protocol without intravenous contrast. Multiplanar CT image reconstructions of the cervical spine were also generated. COMPARISON:  CT head 08/29/2012. Radiographs of the cervical spine 08/29/2012. FINDINGS: CT HEAD FINDINGS Brain: Mild cerebral atrophy. There is no acute intracranial hemorrhage. No demarcated cortical infarct. No extra-axial fluid collection. No evidence of intracranial mass. No midline shift. Vascular: No hyperdense vessel.  Atherosclerotic calcifications. Skull: Normal. Negative for fracture or focal lesion. Sinuses/Orbits: Visualized orbits show no acute finding. Mild frontal, ethmoid and left maxillary sinus mucosal thickening. Small mucous retention cysts within the right sphenoid and left maxillary sinuses. Other: Small left parietal scalp hematoma (series 4, image 30). CT CERVICAL SPINE FINDINGS Alignment: Mild reversal of the expected cervical lordosis. 3 mm C3-C4 grade 1 anterolisthesis. Skull base and vertebrae: The basion-dental  and atlanto-dental intervals are maintained.No evidence of acute fracture to the cervical spine. Soft tissues and spinal canal: No prevertebral fluid or swelling. No visible canal hematoma. Disc levels: Cervical spondylosis with multilevel disc space narrowing, disc bulges, uncovertebral hypertrophy and facet arthrosis. Fusion across the anterior and posterior disc space at C2-C3 and C4-C5. Facet joint ankylosis bilaterally at C2-C3 and on the left at C4-C5. Upper chest: No consolidation within the imaged lung apices. No visible pneumothorax. Other: 2.1 cm right thyroid lobe nodule. IMPRESSION: CT head: 1. No evidence of acute intracranial abnormality. 2. Small left parietal scalp hematoma. 3. Mild cerebral atrophy. 4. Mild paranasal sinus disease as described. CT cervical spine: 1. No evidence of acute fracture to the cervical spine. 2. C3-C4 grade 1 anterolisthesis. 3. Nonspecific reversal of the expected cervical lordosis. 4. Cervical spondylosis as described. C2-C3 and C4-C5 fusion. 5. Previously biopsied 2.1 cm right thyroid lobe nodule. Electronically Signed   By: Jackey Loge DO   On: 03/31/2020 12:10   MR BRAIN W WO CONTRAST  Result Date: 30-Apr-2020 CLINICAL DATA:  Mental status change, unknown cause. Multiple recent falls. EXAM: MRI HEAD WITHOUT AND WITH CONTRAST TECHNIQUE: Multiplanar, multiecho pulse sequences of the brain and surrounding structures were obtained without and with intravenous contrast. CONTRAST:  9mL GADAVIST GADOBUTROL 1 MMOL/ML IV SOLN COMPARISON:  Head CT 04/13/2020.  CT angiogram head/neck 08/29/2012. FINDINGS: Brain: Mild cerebral atrophy. Mild periventricular and scattered T2/FLAIR hyperintensity within the cerebral white matter is nonspecific, but compatible with chronic small vessel ischemic disease. Scattered SWI signal loss along the bilateral frontal, parietal, occipital and temporal lobes compatible with chronic blood products and sequela of remote subarachnoid hemorrhage.  Nonspecific chronic microhemorrhage within the left cerebellar hemisphere (series 8, image 10). There is no acute infarct. No evidence of intracranial mass. No extra-axial fluid collection. No midline shift. No abnormal intracranial enhancement. Vascular: Expected proximal arterial flow voids. Skull and upper cervical spine: No focal marrow lesion.  Sinuses/Orbits: Visualized orbits show no acute finding. Mild paranasal sinus mucosal thickening, most notably ethmoidal. Small mucous retention cysts within the right sphenoid and left maxillary sinuses. IMPRESSION: No evidence of acute intracranial abnormality. Scattered chronic blood products along the bilateral cerebral hemispheres compatible with sequela of remote subarachnoid hemorrhage. Mild cerebral atrophy and chronic small vessel ischemic disease. Paranasal sinus disease as described. Electronically Signed   By: Jackey Loge DO   On: 04/04/2020 10:26   DG Pelvis Portable  Result Date: 2020-04-23 CLINICAL DATA:  Recent falls with pelvic pain, initial encounter EXAM: PORTABLE PELVIS 1-2 VIEWS COMPARISON:  None. FINDINGS: Pelvic ring is intact. Postsurgical changes are noted. Degenerative changes of the hip joints are seen bilaterally. No acute fracture is seen. No soft tissue abnormality is noted. IMPRESSION: No acute abnormality noted. Electronically Signed   By: Alcide Clever M.D.   On: 2020-04-23 11:54   CT CHEST ABDOMEN PELVIS W CONTRAST  Result Date: 2020-04-23 CLINICAL DATA:  Multiple falls over the past week. Initial encounter. EXAM: CT CHEST, ABDOMEN, AND PELVIS WITH CONTRAST TECHNIQUE: Multidetector CT imaging of the chest, abdomen and pelvis was performed following the standard protocol during bolus administration of intravenous contrast. CONTRAST:  OMNIPAQUE IOHEXOL 300 MG/ML  SOLN COMPARISON:  CT chest 09/19/2019.  CT right hip 07/26/2019. 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 No  dissection is identified. There is cardiomegaly. Aortic and coronary atherosclerosis are again seen. No pericardial effusion. Mediastinum/Nodes: No lymphadenopathy. The walls of the esophagus are mildly thickened and there is some fluid within the esophagus. 1.5 cm low attenuating lesion the right lobe of the thyroid is unchanged and has been evaluated previously with ultrasound. Not clinically significant; no follow-up imaging recommended (ref: J Am Coll Radiol. 2015 Feb;12(2): 143-50). Lungs/Pleura: No pleural effusion. There is some dependent atelectasis. No pneumothorax or consolidative process. Subtle 0.8 cm subpleural nodule in the left upper lobe is unchanged on image 67. 0.5 cm subpleural nodule in the right lower lobe on image 85 is also unchanged. Musculoskeletal: The patient has a very mild superior endplate compression fracture of T11 which is new since the prior examination. No bony retropulsion or involvement of the posterior elements is identified. Also seen is a fracture of the posterior arc of T11 medially adjacent to the vertebral body. There is some callus formation about the fracture. No other fracture is identified. CT ABDOMEN PELVIS FINDINGS Hepatobiliary: The liver is low attenuating. No focal lesion. Stones are seen in the gallbladder. No CT evidence of cholecystitis. Biliary tree is negative. Pancreas: Unremarkable. No pancreatic ductal dilatation or surrounding inflammatory changes. Spleen: Normal in size without focal abnormality. Adrenals/Urinary Tract: Adrenal glands are unremarkable. Kidneys are normal, without renal calculi, focal lesion, or hydronephrosis. Bladder is unremarkable. Stomach/Bowel: Stomach is within normal limits. Appendix appears normal. No evidence of bowel wall thickening, distention, or inflammatory changes. Vascular/Lymphatic: Aortic atherosclerosis. No enlarged abdominal or pelvic lymph nodes. Reproductive: Status post prostatectomy. Penile prosthesis in place.  Other: Fat containing inguinal hernias, larger on the right. No change. Musculoskeletal: No acute or focal abnormality. IMPRESSION: Mild T11 superior endplate compression fracture without bony retropulsion or involvement of the posterior elements. There is also a fracture of the medially J adjacent right eleventh rib with some callus formation. The fractures are new since the 09/18/2019 chest CT and most consistent with subacute to late subacute injuries. No other evidence of trauma. Mild thickening of the walls of the esophagus suggestive of inflammatory  change. There is some fluid in the esophagus which could be due to poor motility or reflux. 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 Cardiomegaly. Gallstones without evidence of cholecystitis. Fatty infiltration of the liver. Diverticulosis without diverticulitis. Fat containing bilateral inguinal hernias. Status post prostatectomy. Aortic Atherosclerosis (ICD10-I70.0). Calcific coronary artery disease also noted. Electronically Signed   By: Drusilla Kanner M.D.   On: 03/27/2020 14:43   DG CHEST PORT 1 VIEW  Result Date: 04/16/2020 CLINICAL DATA:  Atrial fibrillation.  Shortness of breath. EXAM: PORTABLE CHEST 1 VIEW COMPARISON:  03/30/2020 FINDINGS: Chronic cardiomegaly. Pulmonary venous hypertension without frank edema. No infiltrate, collapse or effusion. The patient has not taken a deep inspiration. IMPRESSION: Cardiomegaly and pulmonary venous hypertension. No frank edema. No infiltrate or collapse. Electronically Signed   By: Paulina Fusi M.D.   On: 04/16/2020 10:48   DG Chest Portable 1 View  Result Date: 04/23/2020 CLINICAL DATA:  Fall EXAM: PORTABLE CHEST 1 VIEW COMPARISON:  2018 FINDINGS: Cardiomegaly similar to the prior study. Low lung volumes. Probable mild interstitial edema. No significant pleural effusion. No pneumothorax. IMPRESSION: Cardiomegaly and probable mild interstitial edema. Electronically Signed   By: Guadlupe Spanish M.D.    On: 03/31/2020 11:57    Microbiology: Recent Results (from the past 240 hour(s))  Resp Panel by RT-PCR (Flu A&B, Covid) Nasopharyngeal Swab     Status: None   Collection Time: 04/10/2020  9:07 PM   Specimen: Nasopharyngeal Swab; Nasopharyngeal(NP) swabs in vial transport medium  Result Value Ref Range Status   SARS Coronavirus 2 by RT PCR NEGATIVE NEGATIVE Final    Comment: (NOTE) SARS-CoV-2 target nucleic acids are NOT DETECTED.  The SARS-CoV-2 RNA is generally detectable in upper respiratory specimens during the acute phase of infection. The lowest concentration of SARS-CoV-2 viral copies this assay can detect is 138 copies/mL. A negative result does not preclude SARS-Cov-2 infection and should not be used as the sole basis for treatment or other patient management decisions. A negative result may occur with  improper specimen collection/handling, submission of specimen other than nasopharyngeal swab, presence of viral mutation(s) within the areas targeted by this assay, and inadequate number of viral copies(<138 copies/mL). A negative result must be combined with clinical observations, patient history, and epidemiological information. The expected result is Negative.  Fact Sheet for Patients:  BloggerCourse.com  Fact Sheet for Healthcare Providers:  SeriousBroker.it  This test is no t yet approved or cleared by the [place] FDA and  has been authorized for detection and/or diagnosis of SARS-CoV-2 by FDA under an Emergency Use Authorization (EUA). This EUA will remain  in effect (meaning this test can be used) for the duration of the COVID-19 declaration under Section 564(b)(1) of the Act, 21 U.S.C.section 360bbb-3(b)(1), unless the authorization is terminated  or revoked sooner.       Influenza A by PCR NEGATIVE NEGATIVE Final   Influenza B by PCR NEGATIVE NEGATIVE Final    Comment: (NOTE) The Xpert Xpress  SARS-CoV-2/FLU/RSV plus assay is intended as an aid in the diagnosis of influenza from Nasopharyngeal swab specimens and should not be used as a sole basis for treatment. Nasal washings and aspirates are unacceptable for Xpert Xpress SARS-CoV-2/FLU/RSV testing.  Fact Sheet for Patients: BloggerCourse.com  Fact Sheet for Healthcare Providers: SeriousBroker.it  This test is not yet approved or cleared by the [place] FDA and has been authorized for detection and/or diagnosis of SARS-CoV-2 by FDA under an Emergency Use Authorization (EUA). This  EUA will remain in effect (meaning this test can be used) for the duration of the COVID-19 declaration under Section 564(b)(1) of the Act, 21 U.S.C. section 360bbb-3(b)(1), unless the authorization is terminated or revoked.  Performed at [facility], [address] [place], [place] [address]   Culture, blood (routine x 2)     Status: None   Collection Time: 04/16/20 10:24 AM   Specimen: BLOOD RIGHT HAND  Result Value Ref Range Status   Specimen Description BLOOD RIGHT HAND  Final   Special Requests   Final    BOTTLES DRAWN AEROBIC AND ANAEROBIC Blood Culture adequate volume   Culture   Final    NO GROWTH 5 DAYS Performed at [facility], [address] [place], [place] [address]    Report Status 04/21/2020 FINAL  Final  Culture, blood (routine x 2)     Status: Abnormal   Collection Time: 04/16/20 10:24 AM   Specimen: BLOOD  Result Value Ref Range Status   Specimen Description BLOOD RIGHT ANTECUBITAL  Final   Special Requests   Final    BOTTLES DRAWN AEROBIC AND ANAEROBIC Blood Culture adequate volume   Culture  Setup Time   Final    GRAM POSITIVE COCCI IN CLUSTERS ANAEROBIC BOTTLE ONLY Organism ID to follow CRITICAL RESULT CALLED TO, READ BACK BY AND VERIFIED WITH: Norva Riffle PharmD 9:40 04/17/20 ([email or web address])    Culture (A)  Final    STAPHYLOCOCCUS  HOMINIS THE SIGNIFICANCE OF ISOLATING THIS ORGANISM FROM A SINGLE SET OF BLOOD CULTURES WHEN MULTIPLE SETS ARE DRAWN IS UNCERTAIN. PLEASE NOTIFY THE MICROBIOLOGY DEPARTMENT WITHIN ONE WEEK IF SPECIATION AND SENSITIVITIES ARE REQUIRED. Performed at [facility], [address] [place], [place] [address]    Report Status 03/29/2020 FINAL  Final  Blood Culture ID Panel (Reflexed)     Status: Abnormal   Collection Time: 04/16/20 10:24 AM  Result Value Ref Range Status   Enterococcus faecalis NOT DETECTED NOT DETECTED Final   Enterococcus Faecium NOT DETECTED NOT DETECTED Final   Listeria monocytogenes NOT DETECTED NOT DETECTED Final   Staphylococcus species DETECTED (A) NOT DETECTED Final    Comment: CRITICAL RESULT CALLED TO, READ BACK BY AND VERIFIED WITH: Norva Riffle PharmD 9:40 04/17/20 ([email or web address])    Staphylococcus aureus (BCID) NOT DETECTED NOT DETECTED Final   Staphylococcus epidermidis NOT DETECTED NOT DETECTED Final   Staphylococcus lugdunensis NOT DETECTED NOT DETECTED Final   Streptococcus species NOT DETECTED NOT DETECTED Final   Streptococcus agalactiae NOT DETECTED NOT DETECTED Final   Streptococcus pneumoniae NOT DETECTED NOT DETECTED Final   Streptococcus pyogenes NOT DETECTED NOT DETECTED Final   A.calcoaceticus-baumannii NOT DETECTED NOT DETECTED Final   Bacteroides fragilis NOT DETECTED NOT DETECTED Final   Enterobacterales NOT DETECTED NOT DETECTED Final   Enterobacter cloacae complex NOT DETECTED NOT DETECTED Final   Escherichia coli NOT DETECTED NOT DETECTED Final   Klebsiella aerogenes NOT DETECTED NOT DETECTED Final   Klebsiella oxytoca NOT DETECTED NOT DETECTED Final   Klebsiella pneumoniae NOT DETECTED NOT DETECTED Final   Proteus species NOT DETECTED NOT DETECTED Final   Salmonella species NOT DETECTED NOT DETECTED Final   Serratia marcescens NOT DETECTED NOT DETECTED Final   Haemophilus influenzae NOT DETECTED NOT DETECTED Final   Neisseria meningitidis  NOT DETECTED NOT DETECTED Final   Pseudomonas aeruginosa NOT DETECTED NOT DETECTED Final   Stenotrophomonas maltophilia NOT DETECTED NOT DETECTED Final   Candida albicans NOT DETECTED NOT DETECTED Final  Candida auris NOT DETECTED NOT DETECTED Final   Candida glabrata NOT DETECTED NOT DETECTED Final   Candida krusei NOT DETECTED NOT DETECTED Final   Candida parapsilosis NOT DETECTED NOT DETECTED Final   Candida tropicalis NOT DETECTED NOT DETECTED Final   Cryptococcus neoformans/gattii NOT DETECTED NOT DETECTED Final    Comment: Performed at [facility], [address], [place], [place] [address]     Labs: Basic Metabolic Panel: Recent Labs  Lab 04/16/20 0853 04/16/20 1645  NA 150*  --   K 4.7  --   CL 116*  --   CO2 23  --   GLUCOSE 161*  --   BUN 27*  --   CREATININE 1.32*  --   CALCIUM 9.0  --   MG 2.0  --   PHOS 4.4 2.8   Liver Function Tests: Recent Labs  Lab 04/16/20 0853  AST 23  ALT 19  ALKPHOS 78  BILITOT 1.3*  PROT 6.1*  ALBUMIN 3.0*   No results for input(s): LIPASE, AMYLASE in the last 168 hours. No results for input(s): AMMONIA in the last 168 hours. CBC: Recent Labs  Lab 04/16/20 0853  WBC 8.6  NEUTROABS 6.2  HGB 11.9*  HCT 40.0  MCV 87.0  PLT 252   Cardiac Enzymes: No results for input(s): CKTOTAL, CKMB, CKMBINDEX, TROPONINI in the last 168 hours. D-Dimer No results for input(s): DDIMER in the last 72 hours. BNP: Invalid input(s): POCBNP CBG: Recent Labs  Lab 04/16/20 0026 04/16/20 0403 04/16/20 0803 04/16/20 1306 04/16/20 1612  GLUCAP 144* 127* 135* 161* 149*   Anemia work up No results for input(s): VITAMINB12, FOLATE, FERRITIN, TIBC, IRON, RETICCTPCT in the last 72 hours. Urinalysis    Component Value Date/Time   COLORURINE YELLOW 04/13/2020 2252   APPEARANCEUR CLEAR 04/13/2020 2252   LABSPEC 1.019 04/13/2020 2252   PHURINE 5.0 04/13/2020 2252   GLUCOSEU 150 (A) 04/13/2020 2252   HGBUR NEGATIVE 04/13/2020 2252    BILIRUBINUR NEGATIVE 04/13/2020 2252   KETONESUR 20 (A) 04/13/2020 2252   PROTEINUR NEGATIVE 04/13/2020 2252   NITRITE NEGATIVE 04/13/2020 2252   LEUKOCYTESUR NEGATIVE 04/13/2020 2252   Sepsis Labs Invalid input(s): PROCALCITONIN,  WBC,  LACTICIDVEN     SIGNED:  Alwyn Ren, MD  [facility] 04/21/2020, 4:11 PM

## 2020-04-26 DEATH — deceased

## 2020-05-16 ENCOUNTER — Encounter (HOSPITAL_COMMUNITY): Payer: Medicare Other | Admitting: Cardiology

## 2020-07-07 IMAGING — NM NM SCAN TUMOR LOCALIZE WITH SPECT
4 series · 19 of 19 positions shown · non-contrast
Comparison: none

CLINICAL DATA: HEART FAILURE. CONCERN FOR CARDIAC AMYLOIDOSIS.
71-year-old male with chronic systolic congestive heart failure

EXAM:
NUCLEAR MEDICINE TUMOR LOCALIZATION. PYP CARDIAC AMYLOIDOSIS SCAN
WITH SPECT
TECHNIQUE: Following intravenous administration of radiopharmaceutical,
anterior planar images of the chest were obtained. Regions of
interest were placed on the heart and contralateral chest wall for
quantitative assessment. Additional SPECT imaging of the chest was
obtained.
RADIOPHARMACEUTICALS:  20.5 mCi TECHNETIUM 99 PYROPHOSPHATE

[Series 1: amyloid_(id)_tra · 4.1mm · 4.14mm/px · 6 of 128 frames shown]
[frame 11/128]
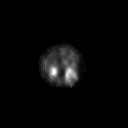
[frame 32/128]
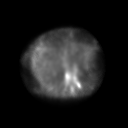
[frame 54/128]
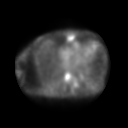
[frame 75/128]
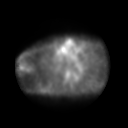
[frame 96/128]
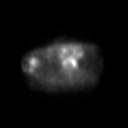
[frame 118/128]
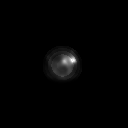

[Series 1: amyloid_(id)_cor · 4.1mm · 4.14mm/px · 6 of 128 frames shown]
[frame 11/128]
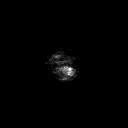
[frame 32/128]
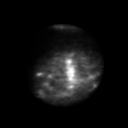
[frame 54/128]
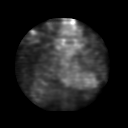
[frame 75/128]
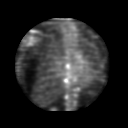
[frame 96/128]
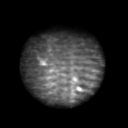
[frame 118/128]
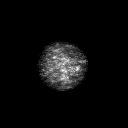

[Series 1: ant-post · 4.14mm/px · 1 of 1 slices shown]
[im 1/1]
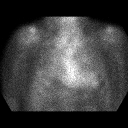

[Series 2: amyloid · 4.14mm/px · 6 of 64 frames shown]
[frame 6/64]
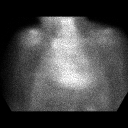
[frame 16/64]
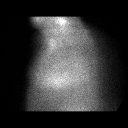
[frame 27/64]
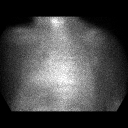
[frame 38/64]
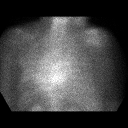
[frame 48/64]
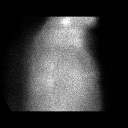
[frame 59/64]
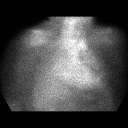

[19 of 19 positions shown; findings below may reference images not displayed]

FINDINGS: Planar Visual assessment:

Anterior planar imaging demonstrates radiotracer uptake within the
heart equal to uptake within the adjacent ribs (Grade 2).

Quantitative assessment :

Quantitative assessment of the cardiac uptake compared to the
contralateral chest wall is equal to 1.2 (H/CL = 1.2).

SPECT assessment: SPECT imaging of the chest demonstrates mild to
moderate radiotracer accumulation within the LEFT ventricle.
IMPRESSION: Visual and quantitative assessment (grade 2, H/CL equal 1.2) are
equivocal for transthyretin amyloidosis. Consider repeat exam in the
future.
